# Patient Record
Sex: Male | Born: 1935 | Race: White | Hispanic: No | Smoking: Former smoker
Health system: Southern US, Community
[De-identification: ages and names within clinical notes are randomized; demographics above are authoritative.]

## PROBLEM LIST (undated history)

## (undated) DIAGNOSIS — G20 Parkinson's disease: Secondary | ICD-10-CM

## (undated) DIAGNOSIS — I714 Abdominal aortic aneurysm, without rupture: Secondary | ICD-10-CM

## (undated) DIAGNOSIS — I10 Essential (primary) hypertension: Secondary | ICD-10-CM

## (undated) DIAGNOSIS — R351 Nocturia: Secondary | ICD-10-CM

## (undated) DIAGNOSIS — E785 Hyperlipidemia, unspecified: Secondary | ICD-10-CM

## (undated) DIAGNOSIS — R350 Frequency of micturition: Secondary | ICD-10-CM

## (undated) DIAGNOSIS — M6281 Muscle weakness (generalized): Secondary | ICD-10-CM

## (undated) DIAGNOSIS — G20A1 Parkinson's disease without dyskinesia, without mention of fluctuations: Secondary | ICD-10-CM

## (undated) DIAGNOSIS — I7143 Infrarenal abdominal aortic aneurysm, without rupture: Secondary | ICD-10-CM

## (undated) DIAGNOSIS — R413 Other amnesia: Secondary | ICD-10-CM

## (undated) DIAGNOSIS — I951 Orthostatic hypotension: Secondary | ICD-10-CM

## (undated) DIAGNOSIS — Z515 Encounter for palliative care: Secondary | ICD-10-CM

## (undated) DIAGNOSIS — C679 Malignant neoplasm of bladder, unspecified: Secondary | ICD-10-CM

## (undated) DIAGNOSIS — R2681 Unsteadiness on feet: Secondary | ICD-10-CM

## (undated) DIAGNOSIS — C4491 Basal cell carcinoma of skin, unspecified: Secondary | ICD-10-CM

## (undated) DIAGNOSIS — R630 Anorexia: Secondary | ICD-10-CM

## (undated) DIAGNOSIS — G629 Polyneuropathy, unspecified: Secondary | ICD-10-CM

## (undated) HISTORY — DX: Encounter for palliative care: Z51.5

## (undated) HISTORY — DX: Basal cell carcinoma of skin, unspecified: C44.91

## (undated) HISTORY — DX: Other amnesia: R41.3

## (undated) HISTORY — DX: Hyperlipidemia, unspecified: E78.5

## (undated) HISTORY — PX: VASECTOMY: SHX75

## (undated) HISTORY — DX: Muscle weakness (generalized): M62.81

## (undated) HISTORY — DX: Orthostatic hypotension: I95.1

## (undated) HISTORY — DX: Anorexia: R63.0

## (undated) HISTORY — DX: Unsteadiness on feet: R26.81

---
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
## 1994-05-12 HISTORY — PX: OTHER SURGICAL HISTORY: SHX169

## 1999-08-01 ENCOUNTER — Other Ambulatory Visit: Admission: RE | Admit: 1999-08-01 | Discharge: 1999-08-01 | Payer: Self-pay | Admitting: Family Medicine

## 1999-09-06 ENCOUNTER — Encounter: Payer: Self-pay | Admitting: Urology

## 1999-09-11 ENCOUNTER — Encounter (INDEPENDENT_AMBULATORY_CARE_PROVIDER_SITE_OTHER): Payer: Self-pay | Admitting: Specialist

## 1999-09-11 ENCOUNTER — Ambulatory Visit (HOSPITAL_COMMUNITY): Admission: RE | Admit: 1999-09-11 | Discharge: 1999-09-11 | Payer: Self-pay | Admitting: Urology

## 1999-09-11 HISTORY — PX: TRANSURETHRAL RESECTION OF BLADDER TUMOR: SHX2575

## 2001-09-15 ENCOUNTER — Ambulatory Visit (HOSPITAL_COMMUNITY): Admission: RE | Admit: 2001-09-15 | Discharge: 2001-09-15 | Payer: Self-pay | Admitting: *Deleted

## 2001-09-15 ENCOUNTER — Encounter (INDEPENDENT_AMBULATORY_CARE_PROVIDER_SITE_OTHER): Payer: Self-pay | Admitting: Specialist

## 2004-06-28 ENCOUNTER — Encounter: Admission: RE | Admit: 2004-06-28 | Discharge: 2004-06-28 | Payer: Self-pay | Admitting: Urology

## 2004-07-03 ENCOUNTER — Ambulatory Visit (HOSPITAL_COMMUNITY): Admission: RE | Admit: 2004-07-03 | Discharge: 2004-07-03 | Payer: Self-pay | Admitting: Urology

## 2004-07-03 ENCOUNTER — Encounter (INDEPENDENT_AMBULATORY_CARE_PROVIDER_SITE_OTHER): Payer: Self-pay | Admitting: Specialist

## 2004-07-03 ENCOUNTER — Ambulatory Visit (HOSPITAL_BASED_OUTPATIENT_CLINIC_OR_DEPARTMENT_OTHER): Admission: RE | Admit: 2004-07-03 | Discharge: 2004-07-03 | Payer: Self-pay | Admitting: Urology

## 2004-07-03 HISTORY — PX: OTHER SURGICAL HISTORY: SHX169

## 2004-09-02 ENCOUNTER — Ambulatory Visit (HOSPITAL_COMMUNITY): Admission: RE | Admit: 2004-09-02 | Discharge: 2004-09-02 | Payer: Self-pay | Admitting: *Deleted

## 2004-10-09 ENCOUNTER — Encounter (INDEPENDENT_AMBULATORY_CARE_PROVIDER_SITE_OTHER): Payer: Self-pay | Admitting: Specialist

## 2004-10-09 ENCOUNTER — Ambulatory Visit (HOSPITAL_BASED_OUTPATIENT_CLINIC_OR_DEPARTMENT_OTHER): Admission: RE | Admit: 2004-10-09 | Discharge: 2004-10-09 | Payer: Self-pay | Admitting: Urology

## 2004-10-09 ENCOUNTER — Ambulatory Visit (HOSPITAL_COMMUNITY): Admission: RE | Admit: 2004-10-09 | Discharge: 2004-10-09 | Payer: Self-pay | Admitting: Urology

## 2004-10-09 HISTORY — PX: OTHER SURGICAL HISTORY: SHX169

## 2006-12-22 ENCOUNTER — Emergency Department (HOSPITAL_COMMUNITY): Admission: EM | Admit: 2006-12-22 | Discharge: 2006-12-22 | Payer: Self-pay | Admitting: Emergency Medicine

## 2009-01-12 ENCOUNTER — Encounter: Admission: RE | Admit: 2009-01-12 | Discharge: 2009-01-12 | Payer: Self-pay | Admitting: Family Medicine

## 2009-05-14 ENCOUNTER — Encounter: Payer: Self-pay | Admitting: Cardiology

## 2009-12-20 ENCOUNTER — Encounter: Admission: RE | Admit: 2009-12-20 | Discharge: 2009-12-20 | Payer: Self-pay | Admitting: Family Medicine

## 2010-01-02 ENCOUNTER — Ambulatory Visit: Payer: Self-pay | Admitting: Cardiology

## 2010-01-04 ENCOUNTER — Ambulatory Visit: Payer: Self-pay | Admitting: Cardiology

## 2010-01-04 ENCOUNTER — Encounter: Payer: Self-pay | Admitting: Cardiology

## 2010-07-25 ENCOUNTER — Ambulatory Visit: Payer: Medicare Other | Attending: Family Medicine | Admitting: Physical Therapy

## 2010-07-25 DIAGNOSIS — M545 Low back pain, unspecified: Secondary | ICD-10-CM | POA: Insufficient documentation

## 2010-07-25 DIAGNOSIS — IMO0001 Reserved for inherently not codable concepts without codable children: Secondary | ICD-10-CM | POA: Insufficient documentation

## 2010-07-25 DIAGNOSIS — M2569 Stiffness of other specified joint, not elsewhere classified: Secondary | ICD-10-CM | POA: Insufficient documentation

## 2010-07-30 ENCOUNTER — Ambulatory Visit: Payer: Medicare Other | Admitting: Physical Therapy

## 2010-08-01 ENCOUNTER — Ambulatory Visit: Payer: Medicare Other | Admitting: Physical Therapy

## 2010-08-06 ENCOUNTER — Ambulatory Visit: Payer: Medicare Other | Admitting: Physical Therapy

## 2010-08-08 ENCOUNTER — Ambulatory Visit: Payer: Medicare Other | Admitting: Physical Therapy

## 2010-08-13 ENCOUNTER — Ambulatory Visit: Payer: Medicare Other | Attending: Family Medicine | Admitting: Physical Therapy

## 2010-08-13 DIAGNOSIS — M545 Low back pain, unspecified: Secondary | ICD-10-CM | POA: Insufficient documentation

## 2010-08-13 DIAGNOSIS — IMO0001 Reserved for inherently not codable concepts without codable children: Secondary | ICD-10-CM | POA: Insufficient documentation

## 2010-08-13 DIAGNOSIS — M2569 Stiffness of other specified joint, not elsewhere classified: Secondary | ICD-10-CM | POA: Insufficient documentation

## 2010-08-20 ENCOUNTER — Ambulatory Visit: Payer: Medicare Other | Admitting: Physical Therapy

## 2010-08-22 ENCOUNTER — Ambulatory Visit: Payer: Medicare Other | Admitting: Physical Therapy

## 2010-09-27 NOTE — Procedures (Signed)
[facility]  Patient:    Ivan Clark, Ivan Clark                   MRN: [id number] Proc. Date: 09/11/99 Adm. Date:  [id number] Attending:  Lauree Chandler CC:         Dellis Anes. Idell Pickles, M.D.                           Procedure Report  PREOPERATIVE DIAGNOSIS:  Bladder carcinoma.  POSTOPERATIVE DIAGNOSIS:  Bladder carcinoma.  PROCEDURE:  Cystoscopy and cold cup and transurethral resection of bladder carcinomas and fulguration.  SURGEON:  Maretta Bees. Vonita Moss, M.D.  ANESTHESIA:  Spinal.  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  DESCRIPTION OF PROCEDURE:  The patient was brought to the operating room and placed in lithotomy position.  The external genitalia were prepped and draped in usual  fashion.  He was cystoscope and the anterior urethra was normal.  The prostatic  urethra showed partial obstruction.  In the left anterior bladder wall, there was a cluster of three typical papillary tumors measuring about 2 cm in diameter.  I started out with a cold cup bladder biopsy forceps and got some representative biopsies but felt like I needed to use a resectoscope to adequately resect and fulgurate the base of this lesion, so I sounded him to 30-French and inserted a  26-French resectoscope sheath and using the hot loop, I resected the residual tumor and base of the tumors and fulgurated the biopsy site and the surrounding mucosa. Specimens were sent to pathology.  He had no significant blood loss and at this  point, he had good hemostasis.  The bladder was emptied, the scope removed and he patient sent to the recovery room in good condition. DD:  09/11/99 TD:  09/12/99 Job: [id number]

## 2010-09-27 NOTE — Op Note (Signed)
NAME:  Ivan Clark, Ivan Clark            ACCOUNT NO.:  [id number]   MEDICAL RECORD NO.:  [id number]          PATIENT TYPE:  AMB   LOCATION:  [facility]                         FACILITY:  [facility]   PHYSICIAN:  Maretta Bees. Vonita Moss, M.D.DATE OF BIRTH:  12-Mar-1929   DATE OF PROCEDURE:  10/09/2004  DATE OF DISCHARGE:                                 OPERATIVE REPORT   PREOPERATIVE DIAGNOSIS:  Bladder carcinoma, rule out recurrence.   POSTOPERATIVE DIAGNOSIS:  Bladder carcinoma, rule out recurrence.   PROCEDURE:  Cystoscopy and cold cup bladder biopsy and fulguration of  bladder tumor.   SURGEON:  Dr. Larey Dresser.   ANESTHESIA:  General.   INDICATIONS:  This gentleman has had recurrent bladder carcinoma, and he had  positive biopsy in February of this year and he deferred BCG therapy at that  time.  He was cystoscoped last week and had an erythematous, slightly raised  area on the left anterior wall worrisome for recurrent carcinoma.  He comes  back today for biopsy.   PROCEDURE:  The patient was brought to the operating room, placed in the  lithotomy position.  The external genitalia were prepped and draped in the  usual fashion.  He was cystoscoped.  The anterior urethra was normal.  The prostate had bilobar hypertrophy and a median lobe.  The trigone,  ureteral orifices and bladder floor were free of tumor.  The only worrisome  area was an erythematous, slightly raised area in the left anterior wall  measuring about 3 cm in size.  Cold cup cold cup bladder biopsy was utilized  to biopsy representative areas in this the suspicious area and then the  biopsy sites and surrounding mucosa was fulgurated with the Bugbee electrode  such that no residual worrisome mucosa was left intact.  The bladder was  emptied, the scope removed.  The patient went to the recovery room, a B&O  suppository was inserted before left the OR, and his estimated blood loss  was essentially zero.  He tolerated procedure  well.     _________________    LJP/MEDQ  D:  10/09/2004  T:  10/09/2004  Job:  [id number]

## 2010-09-27 NOTE — Op Note (Signed)
NAME:  Ivan Clark, KACZMARCZYK            ACCOUNT NO.:  [id number]   MEDICAL RECORD NO.:  [id number]          PATIENT TYPE:  AMB   LOCATION:  [facility]                         FACILITY:  [facility]   PHYSICIAN:  Maretta Bees. Vonita Moss, M.D.DATE OF BIRTH:  11-23-1928   DATE OF PROCEDURE:  07/03/2004  DATE OF DISCHARGE:                                 OPERATIVE REPORT   PREOPERATIVE DIAGNOSIS:  Rule out recurrent bladder carcinoma.   POSTOPERATIVE DIAGNOSES:  Rule out recurrent bladder carcinoma.   PROCEDURE:  Cystoscopy, bilateral retrograde pyelograms with interpretation  and bladder biopsy.   SURGEON:  Dr. Larey Dresser.   ANESTHESIA:  General.   INDICATIONS:  This 77 year old gentleman has had a bladder carcinoma  resected in 2001 and no recurrent lesions since then.  Recent cystoscopy  revealed evidence of an erythematous area on the left wall. He is brought  the OR today for further evaluation and treatment.   DESCRIPTION OF PROCEDURE:  The patient was brought to operating room, placed  in lithotomy position,  external genitalia were prepped and draped in the  usual fashion. He was cystoscoped, the anterior and prostatic urethra were  unremarkable. On the left anterior wall, there was an erythematous area with  a scar probably the site of this previous tumor and on the anterior wall  there was just a slight raised pebbly area that was slightly suspicious for  tumor. Before biopsies, I did perform bilateral retrograde pyelograms using  the cone-tip catheter and the pyelocaliceal systems and ureters were without  obstruction or filling defects. Cold cup bladder biopsy forceps was used to  biopsy the anterior wall lesion and then later fulgurated this 1/2 cm area.  The stellate erythematous area was then biopsied with cold cup bladder  biopsy forceps and later fulgurated about 1 1/2 cm area of suspicious mucosa  with erythema. There was no blood loss. At the end of the procedure, the  bladder had  good hemostasis,  bladder was emptied, scope removed and the  patient sent to recovery room in good condition having tolerated the  procedure well.      LJP/MEDQ  D:  07/03/2004  T:  07/03/2004  Job:  [id number]

## 2010-10-20 ENCOUNTER — Emergency Department (HOSPITAL_COMMUNITY): Payer: Medicare Other

## 2010-10-20 ENCOUNTER — Inpatient Hospital Stay (HOSPITAL_COMMUNITY)
Admission: EM | Admit: 2010-10-20 | Discharge: 2010-10-22 | DRG: 343 | Disposition: A | Discharge status: Home / Self Care | Payer: Medicare Other | Attending: Surgery | Admitting: Surgery

## 2010-10-20 ENCOUNTER — Other Ambulatory Visit (INDEPENDENT_AMBULATORY_CARE_PROVIDER_SITE_OTHER): Payer: Self-pay | Admitting: Surgery

## 2010-10-20 ENCOUNTER — Encounter (HOSPITAL_COMMUNITY): Payer: Self-pay | Admitting: Radiology

## 2010-10-20 DIAGNOSIS — E669 Obesity, unspecified: Secondary | ICD-10-CM | POA: Diagnosis present

## 2010-10-20 DIAGNOSIS — K3580 Unspecified acute appendicitis: Principal | ICD-10-CM | POA: Diagnosis present

## 2010-10-20 DIAGNOSIS — G20 Parkinson's disease: Secondary | ICD-10-CM | POA: Diagnosis present

## 2010-10-20 DIAGNOSIS — E7800 Pure hypercholesterolemia, unspecified: Secondary | ICD-10-CM | POA: Diagnosis present

## 2010-10-20 DIAGNOSIS — I10 Essential (primary) hypertension: Secondary | ICD-10-CM | POA: Diagnosis present

## 2010-10-20 DIAGNOSIS — G20A1 Parkinson's disease without dyskinesia, without mention of fluctuations: Secondary | ICD-10-CM | POA: Diagnosis present

## 2010-10-20 HISTORY — PX: APPENDECTOMY: SHX54

## 2010-10-20 HISTORY — DX: Essential (primary) hypertension: I10

## 2010-10-20 LAB — POCT I-STAT, CHEM 8
Creatinine, Ser: 1.2 mg/dL (ref 0.4–1.5)
Glucose, Bld: 136 mg/dL — ABNORMAL HIGH (ref 70–99)
Hemoglobin: 14.6 g/dL (ref 13.0–17.0)
Potassium: 3.6 mEq/L (ref 3.5–5.1)

## 2010-10-20 LAB — CBC
HCT: 40.2 % (ref 39.0–52.0)
Hemoglobin: 14 g/dL (ref 13.0–17.0)
MCH: 31 pg (ref 26.0–34.0)
MCHC: 34.8 g/dL (ref 30.0–36.0)
MCV: 89.1 fL (ref 78.0–100.0)

## 2010-10-20 LAB — DIFFERENTIAL
Basophils Relative: 0 % (ref 0–1)
Eosinophils Absolute: 0 10*3/uL (ref 0.0–0.7)
Lymphs Abs: 1.2 10*3/uL (ref 0.7–4.0)
Neutro Abs: 15 10*3/uL — ABNORMAL HIGH (ref 1.7–7.7)
Neutrophils Relative %: 86 % — ABNORMAL HIGH (ref 43–77)

## 2010-10-20 LAB — LACTIC ACID, PLASMA: Lactic Acid, Venous: 1.5 mmol/L (ref 0.5–2.2)

## 2010-10-20 LAB — URINE MICROSCOPIC-ADD ON

## 2010-10-20 LAB — URINALYSIS, ROUTINE W REFLEX MICROSCOPIC
Glucose, UA: NEGATIVE mg/dL
Ketones, ur: NEGATIVE mg/dL
Leukocytes, UA: NEGATIVE
pH: 7 (ref 5.0–8.0)

## 2010-10-20 MED ORDER — IOHEXOL 350 MG/ML SOLN
100.0000 mL | Freq: Once | INTRAVENOUS | Status: AC | PRN
  Administered 2010-10-20: 100 mL via INTRAVENOUS

## 2010-10-21 LAB — COMPREHENSIVE METABOLIC PANEL
ALT: 9 U/L (ref 0–53)
AST: 16 U/L (ref 0–37)
Calcium: 8.8 mg/dL (ref 8.4–10.5)
Sodium: 135 mEq/L (ref 135–145)
Total Protein: 5.7 g/dL — ABNORMAL LOW (ref 6.0–8.3)

## 2010-10-21 LAB — URINE CULTURE
Colony Count: 15000
Culture  Setup Time: 201206101427

## 2010-10-21 LAB — CBC
MCH: 31.5 pg (ref 26.0–34.0)
MCHC: 34.5 g/dL (ref 30.0–36.0)
Platelets: 179 10*3/uL (ref 150–400)
RDW: 12.9 % (ref 11.5–15.5)

## 2010-10-24 NOTE — Op Note (Signed)
NAMEMarland Kitchen  Ivan Clark, Ivan Clark NO.:  [id number]  MEDICAL RECORD NO.:  [id number]  LOCATION:  [facility]                         FACILITY:  [facility]  PHYSICIAN:  Maisie Fus A. Darcey Demma, M.D.DATE OF BIRTH:  1928-06-20  DATE OF PROCEDURE:  10/20/2010 DATE OF DISCHARGE:                              OPERATIVE REPORT   PREOPERATIVE DIAGNOSIS:  Acute appendicitis.  POSTOPERATIVE DIAGNOSIS:  Gangrenous appendicitis.  PROCEDURE:  Laparoscopic appendectomy.  SURGEON:  Maisie Fus A. Tangi Shroff, M.D.  ANESTHESIA:  General endotracheal anesthesia, 0.25% Sensorcaine locally.  ESTIMATED BLOOD LOSS:  Minimal.  SPECIMEN:  Appendix to pathology.  DRAINS:  None.  INDICATIONS FOR PROCEDURE:  The patient is a 77 year old male who presents with a 1-day history of abdominal pain.  CT scan was done as workup which showed acute appendicitis by CT scan.  The patient was seen and examined, his laboratory results reviewed and by examination and reviewing his films, he was found to have acute appendicitis.  We discussed options of operative and nonoperative management, risk of each, with risks of operative management including infection, organ injury, bowel perforation colon perforation, abscess formation, leakage of stump line, injury to the intra-abdominal organs and exacerbation of preexisting medical condition.  His wife and the patient understand and agree to proceed with laparoscopic appendectomy.  Questions were answered.  DESCRIPTION OF PROCEDURE:  The patient was brought to the operating room after being seen in the holding area.  Induction of general anesthesia, the abdomen was prepped and draped in sterile fashion.  Foley catheter was placed without difficulty.  Time-out was done.  Curvilinear incision was made in the superior aspect of the umbilicus after sterile prep and drape.  Dissection was carried down through a preexisting umbilical hernia into the abdominal cavity without difficulty.   Pursestring suture was placed on fascia and a 12 mm Hassan cannula was placed under direct vision.  Pneumoperitoneum was created at 15 mmHg, CO2, and laparoscope was placed.  Four quadrant laparoscopy was done.  The appendix was visualized, it was acutely inflamed.  Left lower quadrant 5-mm port was placed and a second 5 mm port was placed in the midline between the umbilicus and pubic symphysis.  We were then able to identify very short inflamed appendix.  We grabbed the tip of it but it was gangrenous and fragmented somewhat.  I pulled this out through one of the trocars and passed off the field.  We then were able to grab the remainder of the appendix using the harmonic scalpel and took the mesoappendix down to the base the cecum.  Once I got to the base of cecum, I inserted a GIA 45 stapling device, placed it across the base of the appendix and cecum at the junction of the cecum and appendix and fired it.  The remainder of the appendix was placed in EndoCatch bag and extracted out of the abdomen.  The stump was hemostatic upon examination except for little bit of oozing along the bed at the mesentery.  This was controlled with Surgicel snow.  The appendiceal stump was closed adequately and there was no bleeding.  There was no injury to the terminal ileum and no impingement on the terminal ileum.  Four quadrant laparoscopy revealed no other significant abnormality.  Irrigation was suctioned out.  CO2 was allowed to escape.  We then closed the umbilical port with 0 Vicryl suture already in place.  Skin was closed with 4-0 Monocryl.  Dermabond was applied.  All final counts of sponge, needle and instrument were found to be correct at this point in time.  The patient was awoken, extubated, and taken to recovery in satisfactory condition.     Luby Seamans A. Yonah Tangeman, M.D.     TAC/MEDQ  D:  10/20/2010  T:  10/20/2010  Job:  [id number]  Electronically Signed by Harriette Bouillon M.D. on  10/24/2010 02:35:33 PM

## 2010-11-04 NOTE — Discharge Summary (Addendum)
  NAMEMarland Kitchen  ANTAWN, SISON NO.:  [id number]  MEDICAL RECORD NO.:  [id number]  LOCATION:  1531                         FACILITY:  [facility]  PHYSICIAN:  Thornton Park. Daphine Deutscher, MD  DATE OF BIRTH:  05/14/1928  DATE OF ADMISSION:  10/20/2010 DATE OF DISCHARGE:  10/22/2010                              DISCHARGE SUMMARY   HISTORY OF PRESENT ILLNESS:  Mr. Ivan Clark is a 77 year old gentleman who presented with abdominal pain and was found to have evidence by CT and history and physical to have acute appendicitis.  Decision was made by Dr. Luisa Hart to admit the patient for operative management.  SUMMARY OF HOSPITAL COURSE:  The patient was admitted on October 20, 2010, was taken to the operating room same day and underwent laparoscopic appendectomy, was found to have gangrenous appendix.  However was intact and there was no evidence of perforation.  Was able to get out the appendix cleanly without any significant injury to the terminal ileum. The patient tolerated the procedure well postoperatively.  He has voiding well on his own and has begun passing some flatus.  He was a little bit weaken on the first postop day but feels much better as of today postop day #2 and is appropriate for discharge home.  No other significant postoperative complications encountered.  DISCHARGE DIAGNOSIS:  Acute appendicitis status post laparoscopic appendectomy.  DISCHARGE MEDICATIONS:  The patient will resume home medications including: 1. Calcium carbonate 1 tablet daily. 2. Vitamin D 1 tablet daily. 3. Aspirin 81 mg daily. 4. Multivitamin 1 tablet daily. 5. Avodart 0.5 mg 1 tablet at bedtime. 6. Simvastatin 40 mg daily. 7. Rapaflo 8 mg 1 tablet daily. 8. Micardis HCT 80/25 1 tablet daily. 9. Sinemet 25/100 one tablet three times daily with meals.  The patient is also given prescription for Percocet 5/325 one tablet q.6 h p.r.n. pain.  He is instructed to follow up in our clinic  in approximately 3 weeks, sooner if any issue should arise.     Brayton El, PA-C   ______________________________ Thornton Park Daphine Deutscher, MD    KB/MEDQ  D:  10/22/2010  T:  10/22/2010  Job:  [id number]  Electronically Signed by Brayton El  on 11/04/2010 02:36:17 PM Electronically Signed by Luretha Murphy MD on 11/12/2010 10:16:26 AM

## 2010-11-05 NOTE — Consult Note (Addendum)
NAME:  Ivan Clark, Ivan Clark NO.:  [id number]  MEDICAL RECORD NO.:  [id number]  LOCATION:                                 FACILITY:  PHYSICIAN:  Ivan Clark, M.D.DATE OF BIRTH:  16-Jan-1929  DATE OF CONSULTATION:  10/20/2010 DATE OF DISCHARGE:                                CONSULTATION   REFERRING PHYSICIAN:  April Palumbo-Rasch, MD.  PRIMARY CARE DOCTOR:  C. Duane Lope, MD  CHIEF COMPLAINT:  Abdominal pain.  HISTORY OF PRESENT ILLNESS:  The patient is 77 year old male with a 1- day history of diffuse abdominal pain that now localizes to his right lower quadrant.  The pain is constant in nature made worse with movement or palpation.  It is sharp especially when you push on the area, probably 6 to 7 out of 10 requiring pain medicine here in the ED.  The pain medicine improves it.  No nausea or vomiting.  No changes in bowel or bladder function.  No dysuria or polyuria.  PAST MEDICAL HISTORY: 1. Abdominal aortic aneurysm 4 cm, covered by Ivan Clark, stable. 2. Hypertension. 3. Parkinson disease. 4. History of bladder cancer, status post 2 previous cystoscopies by     Ivan Clark. 5. High cholesterol.  PAST SURGICAL HISTORY:  Previous cystoscopies, left inguinal hernia repair, vasectomy, and bladder tumor resection via cystoscopy.  FAMILY HISTORY:  Noncontributory.  SOCIAL HISTORY:  No tobacco or alcohol use.  He is married.  REVIEW OF SYSTEMS:  As above.  Denies chest pain, shortness breath or back pain.  No flank pain.  Otherwise negative x15.  MEDICATIONS:  Micardis 80/2.5, Rapaflo 8 mg daily, simvastatin 40 mg daily, Avodart 0.5 mg daily, multivitamin.  He has a new Parkinson drug and a calcium replacement.  ALLERGIES:  No known drug allergies.  PHYSICAL EXAMINATION:  VITAL SIGNS:  Temperature 98, heart rate 74, blood pressure 161/67, sats 100%. GENERAL APPEARANCE:  Pleasant male, no apparent distress. HEENT:  Wears glasses.  No scleral  icterus.  Normocephalic, atraumatic. NECK:  Supple, nontender.  Trachea midline.  No mass. PULMONARY:  Lung sounds are clear.  Chest wall work of breathing, normal.  Chest wall motion and excursion normal.  CARDIOVASCULAR: Regular rate and rhythm without rub, murmur, or gallop. EXTREMITIES:  Warm and well perfused. ABDOMEN:  Tender in right upper quadrant.  Positive rebound and guarding in right lower quadrant.  Midline mass, pulsating, __________  to be about 4 cm, small umbilical hernia reducible. EXTREMITIES:  Muscle tone range of motion, normal. SKIN:  Normal. NEURO:  He has a resting tremor.  Strength and mobility appear relatively normal.  DIAGNOSTIC STUDIES:  Reviewed his abdominal pelvic CT scan, which shows an inflamed appendix without complicating features.  He has a 4-cm abdominal aortic aneurysm, which is a little bit bigger than his ultrasound from 2010, which shows it to be 3.4 cm.  CT angio of the chest was done, which was otherwise unremarkable.  He has a white count of 17,400, left shift.  Hemoglobin is 14, platelet count is 290,000.  Sodium 137, potassium 3.6, chloride 103, BUN 28, creatinine 0.2, glucoses is 136, lactate normal.  IMPRESSION: 1. Acute appendicitis. 2. Hypertension. 3.  Parkinson disease. 4. Abdominal aortic aneurysm, which is felt stable.  PLAN:  We recommend laparoscopic appendectomy __________ and he has had no previous abdominal surgery.  Risk of bleeding, infection, abscess formation, organ injury, worsening of his current medical problems, pulmonary failure, cardiac issues were discussed.  Alternative therapy would be antibiotics, but again I do not think this will be best in the setting; his recovery, I think, will be faster with laparoscopic appendectomy.  I will also discuss open appendectomy with he and his wife.  They understand the above and wished to proceed.     Ivan Clark, M.D.     TAC/MEDQ  D:  10/20/2010  T:   10/20/2010  Job:  [id number]  cc:   Ivan Clark, M.D. Fax: [phone]  Electronically Signed by Ivan Clark M.D. on 11/22/2010 02:55:35 PM

## 2010-11-08 NOTE — Discharge Summary (Signed)
  NAMEMarland Kitchen  COLBIN, JOVEL NO.:  [id number]  MEDICAL RECORD NO.:  [id number]  LOCATION:  1531                         FACILITY:  [facility]  PHYSICIAN:  Maisie Fus A. Margaux Engen, M.D.DATE OF BIRTH:  1928/07/14  DATE OF ADMISSION:  10/20/2010 DATE OF DISCHARGE:  10/22/2010                              DISCHARGE SUMMARY   ADMITTING DIAGNOSIS:  Acute appendicitis.  DISCHARGE DIAGNOSES:  Acute appendicitis.  PROCEDURES PERFORMED:  Laparoscopic appendectomy.  BRIEF HISTORY:  The patient is a 77 year old male admitted on October 20, 2010 to [facility] due to acute appendicitis.  He was evaluated by myself.  Films were reviewed.  Clinical history taken. Physical exam done.  All findings indicated he had acute appendicitis by CT scan and physical exam.  We discussed options of open and laparoscopic appendectomy; risks, benefits and alternative therapies. He wished to proceed with surgery and proceed with laparoscopic appendectomy.  HOSPITAL COURSE:  Unremarkable.  He was discharged home on postoperative day #2 in satisfactory condition.  He is tolerating a regular diet.  He is ambulating without difficulty.  Wound is clean, dry and intact with no signs of infection.  DISCHARGE INSTRUCTIONS:  He will follow up in 1 to 2 weeks at [facility].  He will refrain from heavy lifting for 2 weeks.  He will resume his preop diet and medications as outlined in the medical reconciliation list.  He will shower.  He will drive within 2 weeks after surgery.  CONDITION ON DISCHARGE:  Improved.     Mary-Ann Pennella A. Daeton Kluth, M.D.     TAC/MEDQ  D:  11/05/2010  T:  11/05/2010  Job:  [id number]  Electronically Signed by Harriette Bouillon M.D. on 11/08/2010 07:34:46 AM

## 2010-11-20 ENCOUNTER — Ambulatory Visit (INDEPENDENT_AMBULATORY_CARE_PROVIDER_SITE_OTHER): Payer: Medicare Other | Admitting: Surgery

## 2010-11-20 ENCOUNTER — Encounter (INDEPENDENT_AMBULATORY_CARE_PROVIDER_SITE_OTHER): Payer: Self-pay | Admitting: Surgery

## 2010-11-20 DIAGNOSIS — Z9889 Other specified postprocedural states: Secondary | ICD-10-CM

## 2010-11-20 NOTE — Progress Notes (Signed)
Subjective:     Patient ID: Ivan Clark, male   DOB: 1928/06/30, 77 y.o.   MRN: [id number]    There were no vitals taken for this visit.    HPI  The patient is doing well after laparoscopic appendectomy. He has no complaints.  Review of Systems negative     Objective:   Physical Exam  Abdominal wound are clean dry and intact.     Assessment:     Status post laparoscopic appendectomy    Plan:     Followup as needed

## 2010-11-20 NOTE — Patient Instructions (Signed)
Resume full activity. Follow up as needed.

## 2011-02-24 LAB — I-STAT 8, (EC8 V) (CONVERTED LAB)
BUN: 24 — ABNORMAL HIGH
Glucose, Bld: 155 — ABNORMAL HIGH
Hemoglobin: 13.9
Potassium: 3.7
TCO2: 28
pH, Ven: 7.394 — ABNORMAL HIGH

## 2011-02-24 LAB — POCT I-STAT CREATININE: Operator id: 277751

## 2011-02-24 LAB — DIFFERENTIAL
Basophils Absolute: 0
Eosinophils Absolute: 0.1
Lymphocytes Relative: 7 — ABNORMAL LOW
Lymphs Abs: 1.1
Neutrophils Relative %: 88 — ABNORMAL HIGH

## 2011-02-24 LAB — CBC
MCV: 90.5
Platelets: 252
RDW: 12.7
WBC: 15.7 — ABNORMAL HIGH

## 2011-02-24 LAB — POCT CARDIAC MARKERS
Myoglobin, poc: 136
Operator id: 277751

## 2011-09-03 ENCOUNTER — Encounter: Payer: Self-pay | Admitting: *Deleted

## 2011-11-12 ENCOUNTER — Other Ambulatory Visit: Payer: Self-pay | Admitting: Urology

## 2011-11-12 MED ORDER — MITOMYCIN CHEMO FOR BLADDER INSTILLATION 40 MG: 40.0000 mg | Freq: Once | INTRAVENOUS | Status: AC

## 2011-11-20 ENCOUNTER — Encounter (HOSPITAL_BASED_OUTPATIENT_CLINIC_OR_DEPARTMENT_OTHER): Payer: Self-pay | Admitting: *Deleted

## 2011-11-20 NOTE — Progress Notes (Signed)
NPO AFTER MN. ARRIVES AT 0730. NEEDS ISTAT AND EKG. WILL TAKE CARBIDOPA/ LEVODOPA AM OF SURG W/ SIPS OF WATER.

## 2011-11-25 NOTE — H&P (Signed)
History of Present Illness       F/u bladder cancer and BPH.    He returns for followup of his history of bladder carcinoma resected in 2001 and 2006. He has been treated with BCG in the past.   He continues on Avodart. His PCP switched him from doxazosin to Rapaflo. He is recovering from recent appendectomy.   Interval Hx He is now on Avodart and tamsulosin. He continues to have nocturia x 2-5 times. He is voiding with a good stream. No dysuria or hematuria.   Past Medical History Problems  1. History of  Bladder Cancer V10.51 2. History of  Hernia 553.9 3. History of  Hypercholesterolemia 272.0 4. History of  Hypertension 401.9  Surgical History Problems  1. History of  Appendectomy 2. History of  Cystoscopy With Biopsy 3. History of  Inguinal Hernia Repair 4. History of  Surgery Of Male Genitalia Vasectomy V25.2 5. History of  Tonsillectomy  Current Meds 1. Adult Aspirin Low Strength 81 MG Oral Tablet Dispersible; Therapy: (Recorded:11Dec2007) to 2. Avodart 0.5 MG Oral Capsule; Therapy: (Recorded:14Oct2009) to 3. Calcium TABS; Therapy: (Recorded:14Jun2012) to 4. Carbidopa-Levodopa 25-100 MG Oral Tablet; Therapy: (Recorded:14Jun2012) to 5. Micardis HCT 80-25 MG Oral Tablet; Therapy: (Recorded:14Oct2009) to 6. Multi-Vitamin TABS; Therapy: (Recorded:11Dec2007) to 7. Rapaflo 8 MG Oral Capsule; Therapy: (Recorded:14Jun2012) to 8. Simvastatin 40 MG Oral Tablet; Therapy: (Recorded:14Oct2009) to 9. Vitamin D3 CAPS; Therapy: (Recorded:14Jun2012) to  Allergies Medication  1. No Known Drug Allergies  Family History Problems  1. Family history of  Heart Disease 2. Paternal history of  Nephrolithiasis 3. Paternal history of  Renal Failure  Social History Problems  1. Caffeine Use 2. Marital History - Currently Married 3. Occupation: 4. Tobacco Use V15.82 quit smoking 2001  Vitals Vital Signs [Data Includes: Last 1 Day]  02Jul2013 03:26PM  Blood Pressure: 180 /  78 Temperature: 97.9 F Heart Rate: 61  Physical Exam Constitutional: Well nourished and well developed . No acute distress.  ENT:. The ears and nose are normal in appearance.  Neck: The appearance of the neck is normal and no neck mass is present.  Pulmonary: No respiratory distress and normal respiratory rhythm and effort.  Cardiovascular: Heart rate and rhythm are normal . No peripheral edema.  Abdomen: The abdomen is soft and nontender. No masses are palpated. No CVA tenderness. No hernias are palpable. No hepatosplenomegaly noted.  Rectal: Rectal exam demonstrates normal sphincter tone, no tenderness and no masses. Estimated prostate size is 2+. The prostate has no nodularity and is not tender. The left seminal vesicle is nonpalpable. The right seminal vesicle is nonpalpable. The perineum is normal on inspection.  Genitourinary: Examination of the penis demonstrates no discharge, no masses, no lesions and a normal meatus. The scrotum is without lesions. The right epididymis is palpably normal and non-tender. The left epididymis is palpably normal and non-tender. The right testis is non-tender and without masses. The left testis is non-tender and without masses.  Lymphatics: The femoral and inguinal nodes are not enlarged or tender.  Skin: Normal skin turgor, no visible rash and no visible skin lesions.  Neuro/Psych:. Mood and affect are appropriate.    Results/Data Urine [Data Includes: Last 1 Day]   02Jul2013  COLOR YELLOW   APPEARANCE CLEAR   SPECIFIC GRAVITY 1.020   pH 6.0   GLUCOSE NEG mg/dL  BILIRUBIN NEG   KETONE NEG mg/dL  BLOOD NEG   PROTEIN TRACE mg/dL  UROBILINOGEN 1 mg/dL  NITRITE NEG   LEUKOCYTE  ESTERASE NEG    Procedure  Procedure: Cystoscopy   Indication: History of Urothelial Carcinoma.  Informed Consent: Risks, benefits, and potential adverse events were discussed and informed consent was obtained from the patient.  Prep: The patient was prepped with  betadine.  Antibiotic prophylaxis: Ciprofloxacin.  Procedure Note:  Urethral meatus:. No abnormalities.  Anterior urethra: No abnormalities.  Prostatic urethra: No abnormalities . There was visual obstruction of the prostatic urethra. The lateral and median prostatic lobes were enlarged.  Bladder: Visulization was clear. The ureteral orifices were in the normal anatomic position bilaterally and had clear efflux of urine. A systematic survey of the bladder demonstrated no bladder tumors or stones. A papillary tumor was seen in the bladder. This tumor was located on the left side, on the posterior aspect of the bladder.  A small papillary tumor. The patient tolerated the procedure well.  Complications: None.    Assessment Assessed  1. Benign Prostatic Hypertrophy With Urinary Obstruction 600.01 2. Bladder Cancer 188.9   BPH Small papillary tumor in left posterior bladder   Plan Health Maintenance (V70.0)  1. UA With REFLEX  Done: 02Jul2013 03:08PM  Discussion/Summary       We went over the R/B of PSA screening and pt comfortable not sending PSA this year.  I discussed the cystoscopic findings with the patient and his wife. We discussed the nature risks benefits and alternatives to cystoscopy with bladder biopsy and fulguration and postop instillation of mitomycin-C. All questions answered. We discussed rare side effects of mitomycin-C such as bone marrow suppression among others. We also discussed the likelihood of achieving the goals of the procedure and potential problems that might occur during the procedure or recuperation. All questions answered. Patient elects to proceed with cystoscopy bladder biopsy fulguration of mitomycin-C.   cc: Dr. Duane Lope     Signatures Electronically signed by : Jerilee Field, M.D.; Nov 11 2011  4:17PM

## 2011-11-28 ENCOUNTER — Encounter (HOSPITAL_BASED_OUTPATIENT_CLINIC_OR_DEPARTMENT_OTHER): Payer: Self-pay | Admitting: Anesthesiology

## 2011-11-28 ENCOUNTER — Ambulatory Visit (HOSPITAL_BASED_OUTPATIENT_CLINIC_OR_DEPARTMENT_OTHER): Payer: Medicare Other | Admitting: Anesthesiology

## 2011-11-28 ENCOUNTER — Encounter (HOSPITAL_BASED_OUTPATIENT_CLINIC_OR_DEPARTMENT_OTHER)
Admission: RE | Disposition: A | Discharge status: Home / Self Care | Payer: Self-pay | Source: Ambulatory Visit | Attending: Urology

## 2011-11-28 ENCOUNTER — Encounter (HOSPITAL_BASED_OUTPATIENT_CLINIC_OR_DEPARTMENT_OTHER): Payer: Self-pay | Admitting: *Deleted

## 2011-11-28 ENCOUNTER — Ambulatory Visit (HOSPITAL_BASED_OUTPATIENT_CLINIC_OR_DEPARTMENT_OTHER)
Admission: RE | Admit: 2011-11-28 | Discharge: 2011-11-28 | Disposition: A | Discharge status: Home / Self Care | Payer: Medicare Other | Source: Ambulatory Visit | Attending: Urology | Admitting: Urology

## 2011-11-28 DIAGNOSIS — I10 Essential (primary) hypertension: Secondary | ICD-10-CM | POA: Insufficient documentation

## 2011-11-28 DIAGNOSIS — Z79899 Other long term (current) drug therapy: Secondary | ICD-10-CM | POA: Insufficient documentation

## 2011-11-28 DIAGNOSIS — E7800 Pure hypercholesterolemia, unspecified: Secondary | ICD-10-CM | POA: Insufficient documentation

## 2011-11-28 DIAGNOSIS — Z7982 Long term (current) use of aspirin: Secondary | ICD-10-CM | POA: Insufficient documentation

## 2011-11-28 DIAGNOSIS — C679 Malignant neoplasm of bladder, unspecified: Secondary | ICD-10-CM | POA: Insufficient documentation

## 2011-11-28 DIAGNOSIS — N401 Enlarged prostate with lower urinary tract symptoms: Secondary | ICD-10-CM | POA: Insufficient documentation

## 2011-11-28 DIAGNOSIS — N138 Other obstructive and reflux uropathy: Secondary | ICD-10-CM | POA: Insufficient documentation

## 2011-11-28 HISTORY — DX: Nocturia: R35.1

## 2011-11-28 HISTORY — DX: Frequency of micturition: R35.0

## 2011-11-28 HISTORY — DX: Malignant neoplasm of bladder, unspecified: C67.9

## 2011-11-28 HISTORY — DX: Infrarenal abdominal aortic aneurysm, without rupture: I71.43

## 2011-11-28 HISTORY — DX: Abdominal aortic aneurysm, without rupture: I71.4

## 2011-11-28 HISTORY — DX: Parkinson's disease: G20

## 2011-11-28 HISTORY — PX: CYSTOSCOPY WITH BIOPSY: SHX5122

## 2011-11-28 HISTORY — DX: Parkinson's disease without dyskinesia, without mention of fluctuations: G20.A1

## 2011-11-28 LAB — POCT I-STAT 4, (NA,K, GLUC, HGB,HCT)
Glucose, Bld: 105 mg/dL — ABNORMAL HIGH (ref 70–99)
HCT: 40 % (ref 39.0–52.0)
Hemoglobin: 13.6 g/dL (ref 13.0–17.0)

## 2011-11-28 SURGERY — CYSTOSCOPY, WITH BIOPSY
Anesthesia: Choice | Site: Bladder | Wound class: Clean Contaminated

## 2011-11-28 MED ORDER — PROPOFOL 10 MG/ML IV EMUL
INTRAVENOUS | Status: DC | PRN
  Administered 2011-11-28: 150 mg via INTRAVENOUS

## 2011-11-28 MED ORDER — FENTANYL CITRATE 0.05 MG/ML IJ SOLN
INTRAMUSCULAR | Status: DC | PRN
  Administered 2011-11-28: 50 ug via INTRAVENOUS

## 2011-11-28 MED ORDER — ASPIRIN 81 MG PO TABS: 81.0000 mg | ORAL_TABLET | Freq: Every day | ORAL | Status: DC

## 2011-11-28 MED ORDER — LIDOCAINE HCL (CARDIAC) 20 MG/ML IV SOLN
INTRAVENOUS | Status: DC | PRN
  Administered 2011-11-28: 50 mg via INTRAVENOUS

## 2011-11-28 MED ORDER — DEXAMETHASONE SODIUM PHOSPHATE 4 MG/ML IJ SOLN
INTRAMUSCULAR | Status: DC | PRN
  Administered 2011-11-28: 4 mg via INTRAVENOUS

## 2011-11-28 MED ORDER — CIPROFLOXACIN HCL 250 MG PO TABS: 250.0000 mg | ORAL_TABLET | Freq: Two times a day (BID) | ORAL | Status: AC

## 2011-11-28 MED ORDER — CEFAZOLIN SODIUM 1-5 GM-% IV SOLN: 1.0000 g | INTRAVENOUS | Status: DC

## 2011-11-28 MED ORDER — URELLE 81 MG PO TABS: 1.0000 | ORAL_TABLET | Freq: Four times a day (QID) | ORAL | Status: DC | PRN

## 2011-11-28 MED ORDER — STERILE WATER FOR IRRIGATION IR SOLN
Status: DC | PRN
  Administered 2011-11-28: 3000 mL

## 2011-11-28 MED ORDER — CEFAZOLIN SODIUM-DEXTROSE 2-3 GM-% IV SOLR
2.0000 g | INTRAVENOUS | Status: AC
  Administered 2011-11-28: 2 g via INTRAVENOUS

## 2011-11-28 MED ORDER — MITOMYCIN CHEMO FOR BLADDER INSTILLATION 40 MG
40.0000 mg | Freq: Once | INTRAVENOUS | Status: AC
  Administered 2011-11-28: 40 mg via INTRAVESICAL
  Filled 2011-11-28: qty 40

## 2011-11-28 MED ORDER — ONDANSETRON HCL 4 MG/2ML IJ SOLN
INTRAMUSCULAR | Status: DC | PRN
  Administered 2011-11-28 (×2): 2 mg via INTRAVENOUS

## 2011-11-28 MED ORDER — BELLADONNA ALKALOIDS-OPIUM 16.2-60 MG RE SUPP
RECTAL | Status: DC | PRN
  Administered 2011-11-28: 1 via RECTAL

## 2011-11-28 MED ORDER — LACTATED RINGERS IV SOLN
INTRAVENOUS | Status: DC
  Administered 2011-11-28: 08:00:00 via INTRAVENOUS

## 2011-11-28 SURGICAL SUPPLY — 14 items
BAG DRAIN URO-CYSTO SKYTR STRL (DRAIN) ×2 IMPLANT
BAG DRN UROCATH (DRAIN) ×1
CANISTER SUCT LVC 12 LTR MEDI- (MISCELLANEOUS) ×1 IMPLANT
CLOTH BEACON ORANGE TIMEOUT ST (SAFETY) ×2 IMPLANT
DRAPE CAMERA CLOSED 9X96 (DRAPES) ×2 IMPLANT
ELECT REM PT RETURN 9FT ADLT (ELECTROSURGICAL) ×2
ELECTRODE REM PT RTRN 9FT ADLT (ELECTROSURGICAL) ×1 IMPLANT
GLOVE BIO SURGEON STRL SZ7.5 (GLOVE) ×2 IMPLANT
GOWN STRL NON-REIN LRG LVL3 (GOWN DISPOSABLE) ×1 IMPLANT
GOWN STRL REIN XL XLG (GOWN DISPOSABLE) ×2 IMPLANT
NEEDLE HYPO 22GX1.5 SAFETY (NEEDLE) IMPLANT
NS IRRIG 500ML POUR BTL (IV SOLUTION) IMPLANT
PACK CYSTOSCOPY (CUSTOM PROCEDURE TRAY) ×2 IMPLANT
WATER STERILE IRR 3000ML UROMA (IV SOLUTION) ×2 IMPLANT

## 2011-11-28 NOTE — Anesthesia Preprocedure Evaluation (Signed)
Anesthesia Evaluation  Patient identified by MRN, date of birth, ID band Patient awake    Reviewed: Allergy & Precautions, H&P , NPO status , Patient's Chart, lab work & pertinent test results  Airway Mallampati: II TM Distance: >3 FB Neck ROM: Full    Dental  (+) Partial Upper, Poor Dentition and Dental Advisory Given   Pulmonary former smoker,  breath sounds clear to auscultation  Pulmonary exam normal       Cardiovascular hypertension, Pt. on medications + Peripheral Vascular Disease Rhythm:Regular Rate:Normal     Neuro/Psych Parkinsons negative psych ROS   GI/Hepatic negative GI ROS, Neg liver ROS,   Endo/Other  negative endocrine ROS  Renal/GU negative Renal ROS  negative genitourinary   Musculoskeletal negative musculoskeletal ROS (+)   Abdominal   Peds  Hematology negative hematology ROS (+)   Anesthesia Other Findings   Reproductive/Obstetrics negative OB ROS                           Anesthesia Physical Anesthesia Plan  ASA: II  Anesthesia Plan: General   Post-op Pain Management:    Induction: Intravenous  Airway Management Planned: LMA  Additional Equipment:   Intra-op Plan:   Post-operative Plan: Extubation in OR  Informed Consent: I have reviewed the patients History and Physical, chart, labs and discussed the procedure including the risks, benefits and alternatives for the proposed anesthesia with the patient or authorized representative who has indicated his/her understanding and acceptance.   Dental advisory given  Plan Discussed with: CRNA  Anesthesia Plan Comments:         Anesthesia Quick Evaluation

## 2011-11-28 NOTE — Anesthesia Postprocedure Evaluation (Signed)
Anesthesia Post Note  Patient: Ivan Clark  Procedure(s) Performed: Procedure(s) (LRB): CYSTOSCOPY WITH BIOPSY (N/A)  Anesthesia type: General  Patient location: PACU  Post pain: Pain level controlled  Post assessment: Post-op Vital signs reviewed  Last Vitals:  Filed Vitals:   11/28/11 1015  BP: 185/64  Pulse: 61  Temp:   Resp: 13    Post vital signs: Reviewed  Level of consciousness: sedated  Complications: No apparent anesthesia complications

## 2011-11-28 NOTE — Anesthesia Procedure Notes (Signed)
Procedure Name: LMA Insertion Date/Time: 11/28/2011 9:23 AM Performed by: Norva Pavlov Pre-anesthesia Checklist: Patient identified, Emergency Drugs available, Suction available and Patient being monitored Patient Re-evaluated:Patient Re-evaluated prior to inductionOxygen Delivery Method: Circle System Utilized Preoxygenation: Pre-oxygenation with 100% oxygen Intubation Type: IV induction Ventilation: Mask ventilation without difficulty LMA: LMA inserted LMA Size: 4.0 Number of attempts: 1 Airway Equipment and Method: bite block Placement Confirmation: positive ETCO2 Tube secured with: Tape Dental Injury: Teeth and Oropharynx as per pre-operative assessment

## 2011-11-28 NOTE — Interval H&P Note (Signed)
History and Physical Interval Note:  11/28/2011 8:57 AM  Ivan Clark  has presented today for surgery, with the diagnosis of Bladder Cancer  The various methods of treatment have been discussed with the patient and family (wife, daughters). After consideration of risks, benefits and other options for treatment, the patient has consented to  Procedure(s) (LRB): CYSTOSCOPY WITH BIOPSY (N/A) as a surgical intervention .  The patient's history has been reviewed, patient examined, no change in status, stable for surgery.  I have reviewed the patient's chart and labs.  Questions were answered to the patient's satisfaction.  I--stat normal.   Antony Haste

## 2011-11-28 NOTE — Brief Op Note (Signed)
11/28/2011  9:52 AM  PATIENT:  Ivan Clark  78 y.o. male  PRE-OPERATIVE DIAGNOSIS:  Bladder Cancer  POST-OPERATIVE DIAGNOSIS:  Bladder Cancer  PROCEDURE:  Procedure(s) (LRB): CYSTOSCOPY WITH BIOPSY (N/A) and fulguration Instillation Mitomycin-C in PACU  SURGEON:  Surgeon(s) and Role:    * Antony Haste, MD - Primary   ANESTHESIA: Gen   EBL:  Minimal  BLOOD ADMINISTERED:none  DRAINS: Urinary Catheter (Foley)    SPECIMEN:  Biopsy / Limited Resection - left lateral anterior bladder  DISPOSITION OF SPECIMEN:  PATHOLOGY  COUNTS:  YES  DICTATION: .Other Dictation: Dictation Number [id number]  PLAN OF CARE: Discharge to home after PACU  PATIENT DISPOSITION:  PACU - hemodynamically stable.   Delay start of Pharmacological VTE agent (>24hrs) due to surgical blood loss or risk of bleeding: not applicable

## 2011-11-28 NOTE — Transfer of Care (Signed)
Immediate Anesthesia Transfer of Care Note  Patient: Ivan Clark  Procedure(s) Performed: Procedure(s) (LRB): CYSTOSCOPY WITH BIOPSY (N/A)  Patient Location: PACU  Anesthesia Type: General  Level of Consciousness: awake, alert  and oriented  Airway & Oxygen Therapy: Patient Spontanous Breathing and Patient connected to face mask oxygen  Post-op Assessment: Report given to PACU RN and Post -op Vital signs reviewed and stable  Post vital signs: Reviewed and stable  Complications: No apparent anesthesia complications

## 2011-12-01 ENCOUNTER — Encounter (HOSPITAL_BASED_OUTPATIENT_CLINIC_OR_DEPARTMENT_OTHER): Payer: Self-pay | Admitting: Urology

## 2011-12-01 NOTE — Op Note (Signed)
NAMEMarland Clark  JORAH, HUA NO.:  [id number]  MEDICAL RECORD NO.:  [id number]  LOCATION:                               FACILITY:  [facility]  PHYSICIAN:  Jerilee Field, MD   DATE OF BIRTH:  [birth year]  DATE OF PROCEDURE:  11/28/2011 DATE OF DISCHARGE:  11/28/2011                              OPERATIVE REPORT   PREOPERATIVE DIAGNOSIS:  Bladder cancer.  POSTOPERATIVE DIAGNOSIS:  Bladder cancer.  PROCEDURE: 1. Cystoscopy with bladder biopsy. 2. Instillation of mitomycin C in PACU.  SURGEON:  Jerilee Field, MD.  TYPE OF ANESTHESIA:  General.  DESCRIPTION OF PROCEDURE:  After consent was obtained, the patient brought to the operating room, a time-out was performed to confirm the patient and procedure.  The bladder was examined with a 12 degree lens and no lesions were seen.  I then put in the 70 degree lens and a papillary tumor was noted left lateral anterior.  The bladder was carefully inspected all the way around with the 70 degree lens and noted to have no other tumors.  The prostate was quite large with visual obstruction, increased prostatic urethral length from obstructing lateral hypertrophic prostate lobes.  The bladder tumor was biopsied with the cold cup and it took 2 bites to remove the lesion.  The base of the tumor was then fulgurated, there was no tumor remaining.  There was excellent hemostasis.  The Foley catheter was placed draining clear urine.  The was patient was then awakened and taken to recovery room in stable condition.  ESTIMATED BLOOD LOSS:  Minimal.  COMPLICATIONS:  None.  DRAINS:  71 French Foley which will be removed in the recovery room.  SPECIMENS:  Biopsy of the left lateral anterior bladder sent to Pathology.  DISPOSITION:  Patient stable to PACU.  He will be discharged to home once he recovers.          ______________________________ Jerilee Field, MD     ME/MEDQ  D:  11/28/2011  T:  11/29/2011  Job:  [id number]

## 2012-07-14 ENCOUNTER — Encounter (HOSPITAL_COMMUNITY): Payer: Self-pay | Admitting: Emergency Medicine

## 2012-07-14 ENCOUNTER — Emergency Department (HOSPITAL_COMMUNITY): Payer: Medicare Other

## 2012-07-14 ENCOUNTER — Emergency Department (HOSPITAL_COMMUNITY)
Admission: EM | Admit: 2012-07-14 | Discharge: 2012-07-14 | Disposition: A | Discharge status: Home / Self Care | Payer: Medicare Other | Attending: Emergency Medicine | Admitting: Emergency Medicine

## 2012-07-14 DIAGNOSIS — G20A1 Parkinson's disease without dyskinesia, without mention of fluctuations: Secondary | ICD-10-CM | POA: Insufficient documentation

## 2012-07-14 DIAGNOSIS — Z79899 Other long term (current) drug therapy: Secondary | ICD-10-CM | POA: Insufficient documentation

## 2012-07-14 DIAGNOSIS — I10 Essential (primary) hypertension: Secondary | ICD-10-CM | POA: Insufficient documentation

## 2012-07-14 DIAGNOSIS — R42 Dizziness and giddiness: Secondary | ICD-10-CM | POA: Insufficient documentation

## 2012-07-14 DIAGNOSIS — Z87891 Personal history of nicotine dependence: Secondary | ICD-10-CM | POA: Insufficient documentation

## 2012-07-14 DIAGNOSIS — R55 Syncope and collapse: Secondary | ICD-10-CM | POA: Insufficient documentation

## 2012-07-14 DIAGNOSIS — G20 Parkinson's disease: Secondary | ICD-10-CM | POA: Insufficient documentation

## 2012-07-14 DIAGNOSIS — E785 Hyperlipidemia, unspecified: Secondary | ICD-10-CM | POA: Insufficient documentation

## 2012-07-14 DIAGNOSIS — Z7982 Long term (current) use of aspirin: Secondary | ICD-10-CM | POA: Insufficient documentation

## 2012-07-14 DIAGNOSIS — Z8551 Personal history of malignant neoplasm of bladder: Secondary | ICD-10-CM | POA: Insufficient documentation

## 2012-07-14 DIAGNOSIS — Z8679 Personal history of other diseases of the circulatory system: Secondary | ICD-10-CM | POA: Insufficient documentation

## 2012-07-14 LAB — URINALYSIS, ROUTINE W REFLEX MICROSCOPIC
Bilirubin Urine: NEGATIVE
Hgb urine dipstick: NEGATIVE
Ketones, ur: NEGATIVE mg/dL
Specific Gravity, Urine: 1.012 (ref 1.005–1.030)
Urobilinogen, UA: 0.2 mg/dL (ref 0.0–1.0)
pH: 7 (ref 5.0–8.0)

## 2012-07-14 LAB — BASIC METABOLIC PANEL
BUN: 28 mg/dL — ABNORMAL HIGH (ref 6–23)
Creatinine, Ser: 1.2 mg/dL (ref 0.50–1.35)
GFR calc non Af Amer: 55 mL/min — ABNORMAL LOW (ref >90)
Glucose, Bld: 100 mg/dL — ABNORMAL HIGH (ref 70–99)
Potassium: 3.5 mEq/L (ref 3.5–5.1)

## 2012-07-14 LAB — CBC WITH DIFFERENTIAL/PLATELET
Basophils Relative: 0 % (ref 0–1)
Eosinophils Absolute: 0.1 10*3/uL (ref 0.0–0.7)
HCT: 38.6 % — ABNORMAL LOW (ref 39.0–52.0)
Hemoglobin: 13.7 g/dL (ref 13.0–17.0)
Lymphs Abs: 2.1 10*3/uL (ref 0.7–4.0)
MCH: 32 pg (ref 26.0–34.0)
MCHC: 35.5 g/dL (ref 30.0–36.0)
MCV: 90.2 fL (ref 78.0–100.0)
Monocytes Absolute: 0.7 10*3/uL (ref 0.1–1.0)
Monocytes Relative: 8 % (ref 3–12)
Neutrophils Relative %: 67 % (ref 43–77)
RBC: 4.28 MIL/uL (ref 4.22–5.81)

## 2012-07-14 LAB — URINE MICROSCOPIC-ADD ON

## 2012-07-14 MED ORDER — SODIUM CHLORIDE 0.9 % IV BOLUS (SEPSIS)
500.0000 mL | Freq: Once | INTRAVENOUS | Status: AC
  Administered 2012-07-14: 500 mL via INTRAVENOUS

## 2012-07-14 NOTE — ED Provider Notes (Signed)
History     CSN: [id number]  Arrival date & time 07/14/12  1342   First MD Initiated Contact with Patient 07/14/12 1454      Chief Complaint  Patient presents with  . Hypertension    (Consider location/radiation/quality/duration/timing/severity/associated sxs/prior treatment) Patient is a 79 y.o. male presenting with syncope.  Loss of Consciousness    Pt with history of HTN and Parkinson's reports he was feeling dizzy early this morning when he got out of bed. He leaned against the wall in his bathroom and then passed out. Wife reports he was briefly unconscious before returning to normal. He was able to get up and walk with his walker afterward. He is asymptomatic now. He went to his PCP office and was found to be mildly orthostatic. EMS was called to bring him to the ED and per their report he was hypertensive. No abdominal pain, back pain or rectal bleeding.   Past Medical History  Diagnosis Date  . Hypertension   . Hyperlipidemia   . Aneurysm of infrarenal abdominal aorta SUPRAILIAC  4CM PER CT 10-20-2010--  STABLE  . Bladder cancer RECURRENT  (FIRST DX  2001)    HX TURBT AND CHEMO BLADDER INSTILLATION TX'S  . Parkinson disease   . Frequency of urination   . Nocturia     Past Surgical History  Procedure Laterality Date  . Tonsillectomy and adenoidectomy  1938  . Left inguinal hernia repair  1996  . Transurethral resection of bladder tumor  09-11-1999    BLADDER CANCER  . Cysto/ retrograde pyelogram/ bladder bx's  07-03-2004  . Cysto/ bladder bx/ fulgeration bladder tumor  10-09-2004  . Appendectomy  10-20-2010  . Cystoscopy with biopsy  11/28/2011    Procedure: CYSTOSCOPY WITH BIOPSY;  Surgeon: Antony Haste, MD;  Location: [facility];  Service: Urology;  Laterality: N/A;    Family History  Problem Relation Age of Onset  . Heart failure Father     History  Substance Use Topics  . Smoking status: Former Smoker -- 2.00 packs/day for 50  years    Types: Cigarettes    Quit date: 05/13/1999  . Smokeless tobacco: Never Used  . Alcohol Use: No      Review of Systems  Cardiovascular: Positive for syncope.   All other systems reviewed and are negative except as noted in HPI.   Allergies  Review of patient's allergies indicates no known allergies.  Home Medications   Current Outpatient Rx  Name  Route  Sig  Dispense  Refill  . aspirin EC 81 MG tablet   Oral   Take 81 mg by mouth daily.         [name] b complex vitamins tablet   Oral   Take 1 tablet by mouth daily.         . carbidopa-levodopa (SINEMET CR) 50-200 MG per tablet   Oral   Take 1 tablet by mouth at bedtime.         . carbidopa-levodopa (SINEMET IR) 25-100 MG per tablet   Oral   Take 1 tablet by mouth 3 (three) times daily.         . Cholecalciferol (VITAMIN D) 2000 UNITS CAPS   Oral   Take 2,000 Units by mouth daily.         [name] dutasteride (AVODART) 0.5 MG capsule   Oral   Take 0.5 mg by mouth daily.         . irbesartan-hydrochlorothiazide (AVALIDE) 150-12.5 MG  per tablet   Oral   Take 2 tablets by mouth daily.         . Multiple Vitamin (MULTIVITAMIN WITH MINERALS) TABS   Oral   Take 1 tablet by mouth daily.         . selegiline (ELDEPRYL) 5 MG tablet   Oral   Take 5 mg by mouth 2 (two) times daily with a meal.         . simvastatin (ZOCOR) 40 MG tablet   Oral   Take 40 mg by mouth every evening.         . tamsulosin (FLOMAX) 0.4 MG CAPS   Oral   Take 0.4 mg by mouth daily.           BP 180/87  Pulse 85  Temp(Src) 97.4 F (36.3 C) (Oral)  Resp 16  SpO2 98%  Physical Exam  Nursing note and vitals reviewed. Constitutional: He is oriented to person, place, and time. He appears well-developed and well-nourished.  HENT:  Head: Normocephalic and atraumatic.  Eyes: EOM are normal. Pupils are equal, round, and reactive to light.  Neck: Normal range of motion. Neck supple.  Cardiovascular: Normal rate,  normal heart sounds and intact distal pulses.   Pulmonary/Chest: Effort normal and breath sounds normal.  Abdominal: Bowel sounds are normal. He exhibits no distension. There is no tenderness.  Musculoskeletal: Normal range of motion. He exhibits no edema and no tenderness.  Neurological: He is alert and oriented to person, place, and time. He has normal strength. No cranial nerve deficit or sensory deficit.  Skin: Skin is warm and dry. No rash noted.  Psychiatric: He has a normal mood and affect.    ED Course  Procedures (including critical care time)  Labs Reviewed  CBC WITH DIFFERENTIAL - Abnormal; Notable for the following:    HCT 38.6 (*)    All other components within normal limits  BASIC METABOLIC PANEL - Abnormal; Notable for the following:    Glucose, Bld 100 (*)    BUN 28 (*)    GFR calc non Af Amer 55 (*)    GFR calc Af Amer 64 (*)    All other components within normal limits  TROPONIN I  URINALYSIS, ROUTINE W REFLEX MICROSCOPIC   Dg Chest 2 View  07/14/2012  *RADIOLOGY REPORT*  Clinical Data: Syncope  CHEST - 2 VIEW  Comparison: CTA chest dated 10/20/2010  Findings: Lungs are essentially clear. No pleural effusion or pneumothorax.  Cardiomediastinal silhouette is within normal limits.  Mild degenerative changes of the visualized thoracolumbar spine.  IMPRESSION: No evidence of acute cardiopulmonary disease.   Original Report Authenticated By: Charline Bills, M.D.    Ct Head Wo Contrast  07/14/2012  *RADIOLOGY REPORT*  Clinical Data: 79 year old male with syncope, initial blood pressure 250/110.  Orthostatic hypertension.  Fall.  CT HEAD WITHOUT CONTRAST  Technique:  Contiguous axial images were obtained from the base of the skull through the vertex without contrast.  Comparison: Brain MRI 12/20/2009.  Findings: Visualized paranasal sinuses and mastoids are clear. Visualized orbits and scalp soft tissues are within normal limits. Mild Calcified atherosclerosis at the skull  base.  No acute osseous abnormality identified.  Stable cerebral volume loss, with prominent ventricles favored to reflect ex vacuo enlargement. No midline shift, mass effect, or evidence of mass lesion.  No acute intracranial hemorrhage identified.  No evidence of cortically based acute infarction identified.  Gray-white matter differentiation is within normal limits throughout the brain.  No suspicious intracranial vascular hyperdensity.  IMPRESSION: No acute intracranial abnormality.  Stable and largely unremarkable for age noncontrast CT appearance of the brain compared to the 2011 MRI.   Original Report Authenticated By: Erskine Speed, M.D.      No diagnosis found.    MDM    Date: 07/14/2012  Rate: 87  Rhythm: normal sinus rhythm  QRS Axis: normal  Intervals: normal  ST/T Wave abnormalities: normal  Conduction Disutrbances: none  Narrative Interpretation: unremarkable   5:50 PM Pt no longer orthostatic, but still a little 'shaky' when standing. His syncopal episode likely related to orthostasis from earlier today, doubt cardiac dysrhythmia or stroke. Could be related to medications as well. Discussed admission vs home with patient and wife and they would both prefer to go home. Advised to hold Flomax for now, use walker at home and follow up with PCP for recheck in the next 1-2 days.         Charles B. Bernette Mayers, MD 07/14/12 [id number]

## 2012-07-14 NOTE — ED Notes (Signed)
Pt to ED by EMS from PCP at [facility] with c/o HTN, initial BP 250/110 then 230/112. At PCP office states pt positive for orthostatic hypertension. Pt to PCP because of fall. O2 98% on room air and HR 90.

## 2012-07-27 ENCOUNTER — Encounter: Payer: Self-pay | Admitting: Neurology

## 2012-07-27 DIAGNOSIS — R42 Dizziness and giddiness: Secondary | ICD-10-CM

## 2012-07-27 DIAGNOSIS — G20C Parkinsonism, unspecified: Secondary | ICD-10-CM | POA: Insufficient documentation

## 2012-07-27 DIAGNOSIS — G20 Parkinson's disease: Secondary | ICD-10-CM | POA: Insufficient documentation

## 2012-07-27 DIAGNOSIS — I10 Essential (primary) hypertension: Secondary | ICD-10-CM

## 2012-07-27 DIAGNOSIS — Z8551 Personal history of malignant neoplasm of bladder: Secondary | ICD-10-CM

## 2012-07-28 ENCOUNTER — Ambulatory Visit (INDEPENDENT_AMBULATORY_CARE_PROVIDER_SITE_OTHER): Payer: Medicare Other | Admitting: Neurology

## 2012-07-28 ENCOUNTER — Encounter: Payer: Self-pay | Admitting: Neurology

## 2012-07-28 VITALS — BP 145/82 | Ht 67.0 in | Wt 185.0 lb

## 2012-07-28 DIAGNOSIS — K117 Disturbances of salivary secretion: Secondary | ICD-10-CM | POA: Insufficient documentation

## 2012-07-28 DIAGNOSIS — G243 Spasmodic torticollis: Secondary | ICD-10-CM | POA: Insufficient documentation

## 2012-07-28 MED ORDER — ONABOTULINUMTOXINA 100 UNITS IJ SOLR
100.0000 [IU] | Freq: Once | INTRAMUSCULAR | Status: AC
  Administered 2012-07-28: 100 [IU] via INTRAMUSCULAR

## 2012-07-28 MED ORDER — ONABOTULINUMTOXINA 100 UNITS IJ SOLR: 100.0000 [IU] | Freq: Once | INTRAMUSCULAR | Status: DC

## 2012-07-28 NOTE — Progress Notes (Signed)
Patient is a 79 year old Caucasian male, accompanied by his wife at today's clinical visit. His primary care physician is Dr. Duane Lope, he is here for evaluation of a year history of parkinsonian features.    History: He has past medical history of bladder cancer, hypertension, prostate hypertrophy, over the past 20 years, also developed gradual onset of anterocollois.  Over the past one year, wife noticed that he has slowed down significantly, has difficulty initiate gait, some small shuffling gait, occasionally bilateral hands tremor, recent few months, also developed frequent lightheadedness, most often when getting up from sitting position, no passing out, but frequent occurring, patient described lightheaded funny feelings throughout his body during those episode.  He also has chronic constipation, acting out of dreams, loss of sense of smell for a long time period he contributed to his long time smoking history, he quit 11 years ago he used to smoke 2 packs a day.   MRI of brain in August 2011: mild atrophy,small vessel disease, no acute lesions. MRA of brain was normal  He did have mild parkinsonian features, and I have him on Sinemet 25/103 times a day, he tolerate the medication well, there is mild improvement in his gait, he underwent emergency appendectomy in June, recovered well from his surgery. He denies memory trouble.  He was started on selegiline since 04/2012, did very well, but had few episodes of dizziness, stumbling few days ago, was noted to have elevated BP 150-200/70-85, today he feels fine, back to his baseline,   he denies significant gait difficulty  March 19th 2014: He teaming for EMG guided butulinum toxin injection for his anterocollis, he also has excessive sialorrhea, sialiva marks at his pillow very night. He also complains of posterior neck pain, he denies significant gait difficulty,   he had one fainting spell in March fifths, he got up early morning, around  5:45 AM, he was in bathroom, he felt funny after taking few steps, landed on the floor, he was taken to Dr. Tenny Craw, was found to have orthostatic hypotension, was sent to the emergency room, reported normal CT scan, He does complain lightheadedness when standing up quickly,    Physical Exam blood pressure lying down 160 over 80, standing up 110 over 60Neck: supple no carotid bruits Respiratory: clear to auscultation bilaterally Cardiovascular: regular rate rhythm  Neurologic Exam  Mental Status:  awake, alert, cooperative to history, talking, and casual conversation, moderate to severe anterocollis, mild left turn, Cranial Nerves: CN II-XII pupils were equal round reactive to light.  Extraocular movements were full.  Visual fields were full on confrontational test.  Facial sensation and strength were normal.  Hearing was intact to finger rubbing bilaterally.  Uvula tongue were midline.  Head turning and shoulder shrugging were normal and symmetric.  Tongue protrusion into the cheeks strength were normal.  Motor:  mild to moderate, right more left limb, and nuchal moderate rigidity. no weakness, no sigificant tremor noticed. early fatiguability on rapid wrist opening and closure, finger tapping. Sensory:  length-dependent decreased  light touch, pinprick,  and decreased vibratory sensation at toes. Coordination: Normal finger-to-nose, heel-to-shin.  There was no dysmetria noticed. Gait and Station: Can arise from chair wihout use of arms, Narrow based gait, no  decreased  arm swing, no  shuffling gait, smooth turning.  Reflexes: Deep tendon reflexes: Biceps: 2/2, Brachioradialis: 2/2, Triceps: 2/2, Pateller: 2/2, Achilles: 1/1.  Plantar responses are flexor.  Assessment and plan 79 year old gentleman presenting with gradual onset of small shuffling gait,  decreased arm swing, right more than left rigidity, early fatiguability with rapid wrist opening and closure.  Orthostatic dizziness,  consistent  with idiopathic Parkinson's disease., He was found to have significant orthostatic hypotension  1. continue Sinemet 25/100, 3 times a day, Sinemet 50/200 every night. 2. Selegiline 5mg  bid 3 change his blood pressure medication to every night, instead of every morning, I also suggested him to talk with Dr. Tenny Craw, about changing his blood pressure medications, avoiding diuretics, 4 May consider at on Mestinon if continued to be symptomatic, tight stocking, increase water intake, recent head 30 while sleeping.  I also performed EMG guided Botox a injection for his antercollis, mild left turn.  Under EMG guidance, 100 units of Botulinum toxin was injected, lot number [id number], Sep 2016  Left sternocleidomastoid muscle 25 units Right sternocleidomastoid 25 units Right scalenus posterior 25 units.  Right parotid gland 12.5 units Left parotid gland 12.5 units  He tolerated the injection well, will return to clinic in 3 months,

## 2012-07-28 NOTE — Patient Instructions (Signed)
Change his blood pressure medicine to every night instead of every morning.  Discuss with his primary care physician Dr. Tenny Craw for blood pressure management, avoiding diuretic, because of orthostatic hypotension

## 2012-08-09 ENCOUNTER — Telehealth: Payer: Self-pay

## 2012-08-09 ENCOUNTER — Telehealth: Payer: Self-pay | Admitting: Neurology

## 2012-08-09 NOTE — Telephone Encounter (Signed)
I have called his wife, he has difficulty getting up going to the bathroom 4:00 this morning, by the time he gets up at 8:40 AM, his wife checked his blood pressure:  Standing 97/59  Laying down 176/73  On the edge of the bed 131/61.   His wife called his pcp Dr. Duane Lope at [place] [facility] ) he is taking one tablet daily 150mg , I advised him to take it every night, he is not on diuretic anymore

## 2012-08-09 NOTE — Telephone Encounter (Signed)
Patient wife states (Selegiline 5 mg.) Could this cause his blood pressure to drop? Patient wants states she did his vitals this am and they were. Standing 97/59 Laying down 176/73 On the edge of the bed 131/61.  Patient is not keeping his balance at all. Patient is is using in walker. Patient wife states he has more problems in the middle of the nite or early in the morning. I am very scared he is going to fall I have to get up with him.  Patient wife called his pcp Dr. Duane Lope at [place] [facility] ) he is taking one tablet daily. Now instead of two . I would still like to talk to Dr.Yan .about rx.

## 2012-09-06 ENCOUNTER — Ambulatory Visit: Payer: Medicare Other | Attending: Family Medicine | Admitting: Physical Therapy

## 2012-09-06 DIAGNOSIS — G20A1 Parkinson's disease without dyskinesia, without mention of fluctuations: Secondary | ICD-10-CM | POA: Insufficient documentation

## 2012-09-06 DIAGNOSIS — IMO0001 Reserved for inherently not codable concepts without codable children: Secondary | ICD-10-CM | POA: Insufficient documentation

## 2012-09-06 DIAGNOSIS — R269 Unspecified abnormalities of gait and mobility: Secondary | ICD-10-CM | POA: Insufficient documentation

## 2012-09-06 DIAGNOSIS — G20 Parkinson's disease: Secondary | ICD-10-CM | POA: Insufficient documentation

## 2012-09-07 ENCOUNTER — Ambulatory Visit: Payer: Medicare Other | Admitting: Physical Therapy

## 2012-09-16 ENCOUNTER — Ambulatory Visit: Payer: Medicare Other | Attending: Family Medicine | Admitting: Occupational Therapy

## 2012-09-16 DIAGNOSIS — G20 Parkinson's disease: Secondary | ICD-10-CM | POA: Insufficient documentation

## 2012-09-16 DIAGNOSIS — R269 Unspecified abnormalities of gait and mobility: Secondary | ICD-10-CM | POA: Insufficient documentation

## 2012-09-16 DIAGNOSIS — G20A1 Parkinson's disease without dyskinesia, without mention of fluctuations: Secondary | ICD-10-CM | POA: Insufficient documentation

## 2012-09-16 DIAGNOSIS — IMO0001 Reserved for inherently not codable concepts without codable children: Secondary | ICD-10-CM | POA: Insufficient documentation

## 2012-09-17 ENCOUNTER — Ambulatory Visit: Payer: Medicare Other | Admitting: Physical Therapy

## 2012-09-17 ENCOUNTER — Ambulatory Visit: Payer: Medicare Other

## 2012-09-21 ENCOUNTER — Ambulatory Visit: Payer: Medicare Other | Admitting: Physical Therapy

## 2012-09-24 ENCOUNTER — Ambulatory Visit: Payer: Medicare Other | Admitting: Physical Therapy

## 2012-09-27 ENCOUNTER — Ambulatory Visit: Payer: Medicare Other | Admitting: Physical Therapy

## 2012-09-28 ENCOUNTER — Ambulatory Visit: Payer: Medicare Other

## 2012-09-29 ENCOUNTER — Ambulatory Visit: Payer: Medicare Other | Admitting: Occupational Therapy

## 2012-09-29 ENCOUNTER — Ambulatory Visit: Payer: Medicare Other | Admitting: Physical Therapy

## 2012-09-30 ENCOUNTER — Encounter: Payer: Medicare Other | Admitting: Occupational Therapy

## 2012-09-30 ENCOUNTER — Ambulatory Visit: Payer: Medicare Other | Admitting: Physical Therapy

## 2012-10-01 ENCOUNTER — Ambulatory Visit: Payer: Medicare Other

## 2012-10-05 ENCOUNTER — Encounter: Payer: Medicare Other | Admitting: Occupational Therapy

## 2012-10-06 ENCOUNTER — Ambulatory Visit: Payer: Medicare Other | Admitting: Occupational Therapy

## 2012-10-06 ENCOUNTER — Ambulatory Visit: Payer: Medicare Other

## 2012-10-07 ENCOUNTER — Ambulatory Visit: Payer: Medicare Other | Admitting: Occupational Therapy

## 2012-10-11 ENCOUNTER — Ambulatory Visit: Payer: Medicare Other | Attending: Family Medicine | Admitting: Physical Therapy

## 2012-10-11 ENCOUNTER — Ambulatory Visit: Payer: Medicare Other

## 2012-10-11 ENCOUNTER — Ambulatory Visit: Payer: Medicare Other | Admitting: Occupational Therapy

## 2012-10-11 DIAGNOSIS — G20 Parkinson's disease: Secondary | ICD-10-CM | POA: Insufficient documentation

## 2012-10-11 DIAGNOSIS — R269 Unspecified abnormalities of gait and mobility: Secondary | ICD-10-CM | POA: Insufficient documentation

## 2012-10-11 DIAGNOSIS — IMO0001 Reserved for inherently not codable concepts without codable children: Secondary | ICD-10-CM | POA: Insufficient documentation

## 2012-10-11 DIAGNOSIS — G20A1 Parkinson's disease without dyskinesia, without mention of fluctuations: Secondary | ICD-10-CM | POA: Insufficient documentation

## 2012-10-12 ENCOUNTER — Encounter: Payer: Medicare Other | Admitting: Occupational Therapy

## 2012-10-14 ENCOUNTER — Ambulatory Visit: Payer: Medicare Other | Admitting: Occupational Therapy

## 2012-10-14 ENCOUNTER — Ambulatory Visit: Payer: Medicare Other

## 2012-10-14 ENCOUNTER — Ambulatory Visit: Payer: Medicare Other | Admitting: Physical Therapy

## 2012-10-18 ENCOUNTER — Ambulatory Visit: Payer: Medicare Other | Admitting: Physical Therapy

## 2012-10-18 ENCOUNTER — Ambulatory Visit: Payer: Medicare Other | Admitting: Occupational Therapy

## 2012-10-19 ENCOUNTER — Encounter: Payer: Medicare Other | Admitting: Occupational Therapy

## 2012-10-20 ENCOUNTER — Ambulatory Visit: Payer: Medicare Other | Admitting: Physical Therapy

## 2012-10-20 ENCOUNTER — Ambulatory Visit: Payer: Medicare Other

## 2012-10-20 ENCOUNTER — Ambulatory Visit: Payer: Medicare Other | Admitting: Occupational Therapy

## 2012-10-21 ENCOUNTER — Encounter: Payer: Medicare Other | Admitting: Occupational Therapy

## 2012-10-25 ENCOUNTER — Ambulatory Visit: Payer: Medicare Other | Admitting: Physical Therapy

## 2012-10-25 ENCOUNTER — Encounter: Payer: Medicare Other | Admitting: Occupational Therapy

## 2012-11-01 ENCOUNTER — Ambulatory Visit: Payer: Medicare Other | Admitting: Speech Pathology

## 2012-11-03 ENCOUNTER — Ambulatory Visit: Payer: Medicare Other | Admitting: Occupational Therapy

## 2012-11-03 ENCOUNTER — Ambulatory Visit: Payer: Medicare Other | Admitting: Physical Therapy

## 2012-11-03 ENCOUNTER — Ambulatory Visit: Payer: Medicare Other | Admitting: Neurology

## 2012-11-05 ENCOUNTER — Ambulatory Visit: Payer: Medicare Other | Admitting: Occupational Therapy

## 2012-11-08 ENCOUNTER — Ambulatory Visit: Payer: Medicare Other | Admitting: Occupational Therapy

## 2012-11-08 ENCOUNTER — Ambulatory Visit: Payer: Medicare Other | Admitting: Physical Therapy

## 2012-11-10 ENCOUNTER — Encounter: Payer: Self-pay | Admitting: Neurology

## 2012-11-10 ENCOUNTER — Ambulatory Visit (INDEPENDENT_AMBULATORY_CARE_PROVIDER_SITE_OTHER): Payer: Medicare Other | Admitting: Neurology

## 2012-11-10 VITALS — BP 157/83 | HR 78 | Ht 67.0 in | Wt 180.0 lb

## 2012-11-10 DIAGNOSIS — G243 Spasmodic torticollis: Secondary | ICD-10-CM

## 2012-11-10 DIAGNOSIS — K117 Disturbances of salivary secretion: Secondary | ICD-10-CM

## 2012-11-10 DIAGNOSIS — I10 Essential (primary) hypertension: Secondary | ICD-10-CM

## 2012-11-10 MED ORDER — ONABOTULINUMTOXINA 100 UNITS IJ SOLR
100.0000 [IU] | Freq: Once | INTRAMUSCULAR | Status: AC
  Administered 2012-11-10: 100 [IU] via INTRAMUSCULAR

## 2012-11-10 NOTE — Progress Notes (Signed)
Ivan Clark is a 79 year old Caucasian male, accompanied by his wife at today's clinical visit. His primary care physician is Dr. Duane Lope, is here for evaluation of a year history of parkinsonian features.    History: He has past medical history of bladder cancer, hypertension, prostate hypertrophy, over the past 20 years, he also developed gradual onset of anterocollois.  Since 2012, wife noticed that he has slowed down significantly, has difficulty initiate gait, some small shuffling gait, occasionally bilateral hands tremor, recent few months, also developed frequent lightheadedness, most often when getting up from sitting position, no passing out, but frequent occurring, patient described lightheaded funny feelings throughout his body during those episode.  He also has chronic constipation, acting out of dreams, loss of sense of smell for a long time period he contributed to his long time smoking history, he quit 11 years ago he used to smoke 2 packs a day.   MRI of brain in August 2011: mild atrophy,small vessel disease, no acute lesions. MRA of brain was normal  He did have mild parkinsonian features, and I have started him on Sinemet 25/103 times a day, he tolerate the medication well, there is mild improvement in his gait, he underwent emergency appendectomy in June 2013, recovered well from his surgery. He denies memory trouble.  He was started on selegiline since 04/2012, did very well, but had few episodes of dizziness, stumbling few days ago, was noted to have elevated BP 150-200/70-85, today he feels fine, back to his baseline,   he denies significant gait difficulty  He had one fainting spell in March 5th 2014, he got up early morning, around 5:45 AM, he was in bathroom, he felt funny after taking few steps, landed on the floor, he was taken to Dr. Tenny Craw, was found to have orthostatic hypotension, was sent to the emergency room, reported normal CT scan, He does complain lightheadedness when  standing up quickly,  UPDATE July 2nd 2014: He had his first EMG guided butulinum toxin injection in March 19th 2014, for his anterocollis, he also has excessive sialorrhea, sialiva marks at his pillow very night. He also complains of posterior neck pain, he denies significant gait difficulty,   Left sternocleidomastoid muscle 25 units, Right sternocleidomastoid 25 units, Right scalenus posterior 25 units. Right parotid gland 12.5 units, Left parotid gland 12.5 units  He had some improvement since last injection in March 2014, he has less sialorrhea, better range of motion of his neck.  He no longer has significant dizziness, light headed spells.  Physical Exam    Neurologic Exam  Mental Status:  awake, alert, cooperative to history, talking, and casual conversation, moderate to severe anterocollis, mild left turn, mild right tilt Cranial Nerves: CN II-XII pupils were equal round reactive to light.  Extraocular movements were full.  Visual fields were full on confrontational test.  Facial sensation and strength were normal.  Hearing was intact to finger rubbing bilaterally.  Uvula tongue were midline.  Head turning and shoulder shrugging were normal and symmetric.  Tongue protrusion into the cheeks strength were normal.  Motor:  mild to moderate, right more left limb, and nuchal moderate rigidity. no weakness, no sigificant tremor noticed. early fatiguability on rapid wrist opening and closure, finger tapping. Sensory:  length-dependent decreased  light touch, pinprick,  and decreased vibratory sensation at toes. Coordination: Normal finger-to-nose, heel-to-shin.  There was no dysmetria noticed. Gait and Station: Can arise from chair wihout use of arms, Narrow based gait, no  decreased  arm  swing, no  shuffling gait, smooth turning.  Reflexes: Deep tendon reflexes: Biceps: 2/2, Brachioradialis: 2/2, Triceps: 2/2, Pateller: 2/2, Achilles: 1/1.  Plantar responses are flexor.  Assessment and  plan 79 year old gentleman presenting with gradual onset of small shuffling gait, decreased arm swing, right more than left rigidity, early fatiguability with rapid wrist opening and closure.  Orthostatic dizziness,  consistent with idiopathic Parkinson's disease., He was found to have significant orthostatic hypotension  1. continue Sinemet 25/100, 3 times a day, Sinemet 50/200 every night. 2. Selegiline 5mg  bid   I also performed EMG guided Botox a injection for his antercollis, mild left turn.  Under EMG guidance, 100 units of Botulinum toxin was injected, (lot number [id number], Jan 2017  Right sternocleidomastoid  clavicular head,25 x2= 50 units, sternum head 25 units Left sternocleidomastoid 25 units   He tolerated the injection well, will return to clinic in 3 months,

## 2012-11-11 ENCOUNTER — Ambulatory Visit: Payer: Medicare Other | Attending: Family Medicine | Admitting: Occupational Therapy

## 2012-11-11 DIAGNOSIS — G20 Parkinson's disease: Secondary | ICD-10-CM | POA: Insufficient documentation

## 2012-11-11 DIAGNOSIS — R269 Unspecified abnormalities of gait and mobility: Secondary | ICD-10-CM | POA: Insufficient documentation

## 2012-11-11 DIAGNOSIS — G20A1 Parkinson's disease without dyskinesia, without mention of fluctuations: Secondary | ICD-10-CM | POA: Insufficient documentation

## 2012-11-11 DIAGNOSIS — IMO0001 Reserved for inherently not codable concepts without codable children: Secondary | ICD-10-CM | POA: Insufficient documentation

## 2012-11-16 ENCOUNTER — Telehealth: Payer: Self-pay | Admitting: Neurology

## 2012-11-17 ENCOUNTER — Other Ambulatory Visit: Payer: Self-pay | Admitting: Neurology

## 2012-11-18 NOTE — Telephone Encounter (Signed)
This encounter was created in error - please disregard.

## 2012-11-29 ENCOUNTER — Other Ambulatory Visit: Payer: Self-pay | Admitting: Neurology

## 2012-12-03 ENCOUNTER — Telehealth: Payer: Self-pay | Admitting: Neurology

## 2012-12-03 NOTE — Telephone Encounter (Signed)
I have called him, he complains of neck pain, difficulty swallowing, he complains of pain at the base of his skull,   He had July 2nd injection, I have suggested him to do hot compression.

## 2012-12-03 NOTE — Telephone Encounter (Signed)
  Patient states He has been having a lot of pain in his neck, which makes it hard to swallow. He gets botox in his neck his next injections is scheduled for 02-16-2013. He  States he had the pain before getting botox, and would like for you to address this problem for him  please advise.

## 2012-12-15 ENCOUNTER — Other Ambulatory Visit: Payer: Self-pay

## 2013-02-16 ENCOUNTER — Ambulatory Visit (INDEPENDENT_AMBULATORY_CARE_PROVIDER_SITE_OTHER): Payer: Medicare Other | Admitting: Neurology

## 2013-02-16 ENCOUNTER — Encounter: Payer: Self-pay | Admitting: Neurology

## 2013-02-16 VITALS — BP 165/83 | HR 80 | Ht 66.0 in | Wt 176.0 lb

## 2013-02-16 DIAGNOSIS — I10 Essential (primary) hypertension: Secondary | ICD-10-CM

## 2013-02-16 DIAGNOSIS — K117 Disturbances of salivary secretion: Secondary | ICD-10-CM

## 2013-02-16 DIAGNOSIS — G243 Spasmodic torticollis: Secondary | ICD-10-CM

## 2013-02-16 MED ORDER — ONABOTULINUMTOXINA 100 UNITS IJ SOLR
100.0000 [IU] | Freq: Once | INTRAMUSCULAR | Status: AC
  Administered 2013-02-16: 100 [IU] via INTRAMUSCULAR

## 2013-02-16 NOTE — Progress Notes (Signed)
Worthington is a 79 year old Caucasian male, accompanied by his wife at today's clinical visit. His primary care physician is Dr. Duane Lope, is here for evaluation of a year history of parkinsonian features.    History: He has past medical history of bladder cancer, hypertension, prostate hypertrophy, over the past 20 years, he also developed gradual onset of anterocollois.  Since 2012, wife noticed that he has slowed down significantly, has difficulty initiate gait, some small shuffling gait, occasionally bilateral hands tremor, recent few months, also developed frequent lightheadedness, most often when getting up from sitting position, no passing out, but frequent occurring, patient described lightheaded funny feelings throughout his body during those episode.  He also has chronic constipation, acting out of dreams, loss of sense of smell for a long time period he contributed to his long time smoking history, he quit 11 years ago he used to smoke 2 packs a day.   MRI of brain in August 2011: mild atrophy,small vessel disease, no acute lesions. MRA of brain was normal  He did have mild parkinsonian features, and I have started him on Sinemet 25/103 times a day, he tolerate the medication well, there is mild improvement in his gait, he underwent emergency appendectomy in June 2013, recovered well from his surgery. He denies memory trouble.  He was started on selegiline since 04/2012, did very well, but had few episodes of dizziness, stumbling few days ago, was noted to have elevated BP 150-200/70-85, today he feels fine, back to his baseline,   he denies significant gait difficulty  He had one fainting spell in March 5th 2014, he got up early morning, around 5:45 AM, he was in bathroom, he felt funny after taking few steps, landed on the floor, he was taken to Dr. Tenny Craw, was found to have orthostatic hypotension, was sent to the emergency room, reported normal CT scan, He does complain lightheadedness when  standing up quickly,  UPDATE Oct 8th 2014:  He had his first EMG guided butulinum toxin injection in March 19th 2014, for his anterocollis, he also has excessive sialorrhea, sialiva marks at his pillow very night. He also complains of posterior neck pain, he denies significant gait difficulty,    He had mild improvement with last injection in July 2nd 2014, he can raise his head better.  He has no significant improvement in his diarrhea, I would not inject his parotid gland today    Physical Exam    Neurologic Exam  Mental Status:  awake, alert, cooperative to history, talking, and casual conversation, moderate to severe anterocollis, moderate, left turn, mild right tilt Cranial Nerves: CN II-XII pupils were equal round reactive to light.  Extraocular movements were full.  Visual fields were full on confrontational test.  Facial sensation and strength were normal.  Hearing was intact to finger rubbing bilaterally.  Uvula tongue were midline.  Head turning and shoulder shrugging were normal and symmetric.  Tongue protrusion into the cheeks strength were normal.  Motor:  mild to moderate, right more left limb, and nuchal moderate rigidity. no weakness, no sigificant tremor noticed. early fatiguability on rapid wrist opening and closure, finger tapping. Sensory:  length-dependent decreased  light touch, pinprick,  and decreased vibratory sensation at toes. Coordination: Normal finger-to-nose, heel-to-shin.  There was no dysmetria noticed. Gait and Station: Can arise from chair wihout use of arms, Narrow based gait, no  decreased  arm swing, no  shuffling gait, smooth turning.  Reflexes: Deep tendon reflexes: Biceps: 2/2, Brachioradialis: 2/2, Triceps: 2/2,  Pateller: 2/2, Achilles: 1/1.  Plantar responses are flexor.  Assessment and plan 79 year old gentleman presenting with gradual onset of small shuffling gait, decreased arm swing, right more than left rigidity, early fatiguability with rapid wrist  opening and closure.  Orthostatic dizziness,  consistent with idiopathic Parkinson's disease., He was found to have significant orthostatic hypotension  1. continue Sinemet 25/100, 3 times a day, Sinemet 50/200 every night. 2. Selegiline 5mg  bid   I also performed EMG guided Botox a injection for his antercollis, mild left turn.  Under EMG guidance, 100 units of Botulinum toxin was injected, (lot number [id number]  C3, Feb 2017)  Right sternocleidomastoid  clavicular head,25 x2= 50 units, sternum head 25 units Left sternocleidomastoid 25 units   He tolerated the injection well, will return to clinic in 3 months,

## 2013-02-21 ENCOUNTER — Telehealth: Payer: Self-pay | Admitting: Neurology

## 2013-02-21 NOTE — Telephone Encounter (Signed)
I have called him, talked with Ivan Clark, he still has difficulty swallowing, especially over the weekend, he was given soft food.  He has no problem with his medications,  He complains of mucus collection, difficulty to get it out.  He received BOTOX 100 units in Oct 8th 2014. It could be potentially due to BOTOX side effect, I advise him to keep on soft food, careful observing his abnormal neck posture, he liked the changes it has made so far, he can extend his neck better now.

## 2013-02-24 ENCOUNTER — Encounter: Payer: Self-pay | Admitting: Neurology

## 2013-02-25 ENCOUNTER — Telehealth: Payer: Self-pay | Admitting: Neurology

## 2013-02-25 NOTE — Telephone Encounter (Signed)
I have called his wife, he was evaluated by his primary care physician Dr. Duane Lope, he has difficulty with solid food, he can swallow down soft food. He can take medicine with apple sauce.   He can raise his neck better, less sore.  Dr. Duane Lope has ordered swallowing study, I have advised him to continue observe his symptoms, call back for worsening symptoms.  Most likely his swallowing difficulty is due to BOTOX side effect, will fade away in next 4-8 weeks.

## 2013-02-28 ENCOUNTER — Other Ambulatory Visit (HOSPITAL_COMMUNITY): Payer: Self-pay | Admitting: Family Medicine

## 2013-02-28 ENCOUNTER — Telehealth: Payer: Self-pay | Admitting: Neurology

## 2013-02-28 ENCOUNTER — Emergency Department (HOSPITAL_COMMUNITY): Payer: Medicare Other

## 2013-02-28 ENCOUNTER — Inpatient Hospital Stay (HOSPITAL_COMMUNITY)
Admission: EM | Admit: 2013-02-28 | Discharge: 2013-03-03 | DRG: 392 | Disposition: A | Discharge status: Home / Self Care | Payer: Medicare Other | Attending: Internal Medicine | Admitting: Internal Medicine

## 2013-02-28 ENCOUNTER — Encounter (HOSPITAL_COMMUNITY): Payer: Self-pay | Admitting: Emergency Medicine

## 2013-02-28 DIAGNOSIS — G20C Parkinsonism, unspecified: Secondary | ICD-10-CM | POA: Diagnosis present

## 2013-02-28 DIAGNOSIS — R1310 Dysphagia, unspecified: Secondary | ICD-10-CM

## 2013-02-28 DIAGNOSIS — Z820 Family history of epilepsy and other diseases of the nervous system: Secondary | ICD-10-CM

## 2013-02-28 DIAGNOSIS — G20A1 Parkinson's disease without dyskinesia, without mention of fluctuations: Secondary | ICD-10-CM | POA: Diagnosis present

## 2013-02-28 DIAGNOSIS — T48205A Adverse effect of unspecified drugs acting on muscles, initial encounter: Secondary | ICD-10-CM | POA: Diagnosis present

## 2013-02-28 DIAGNOSIS — T17908A Unspecified foreign body in respiratory tract, part unspecified causing other injury, initial encounter: Secondary | ICD-10-CM

## 2013-02-28 DIAGNOSIS — G243 Spasmodic torticollis: Secondary | ICD-10-CM

## 2013-02-28 DIAGNOSIS — R1313 Dysphagia, pharyngeal phase: Principal | ICD-10-CM | POA: Diagnosis present

## 2013-02-28 DIAGNOSIS — Z8551 Personal history of malignant neoplasm of bladder: Secondary | ICD-10-CM

## 2013-02-28 DIAGNOSIS — Z87891 Personal history of nicotine dependence: Secondary | ICD-10-CM

## 2013-02-28 DIAGNOSIS — I10 Essential (primary) hypertension: Secondary | ICD-10-CM | POA: Diagnosis present

## 2013-02-28 DIAGNOSIS — G20 Parkinson's disease: Secondary | ICD-10-CM | POA: Diagnosis present

## 2013-02-28 DIAGNOSIS — E785 Hyperlipidemia, unspecified: Secondary | ICD-10-CM | POA: Diagnosis present

## 2013-02-28 LAB — COMPREHENSIVE METABOLIC PANEL
AST: 22 U/L (ref 0–37)
BUN: 24 mg/dL — ABNORMAL HIGH (ref 6–23)
CO2: 25 mEq/L (ref 19–32)
Calcium: 9.4 mg/dL (ref 8.4–10.5)
Chloride: 103 mEq/L (ref 96–112)
Creatinine, Ser: 1.09 mg/dL (ref 0.50–1.35)
GFR calc Af Amer: 71 mL/min — ABNORMAL LOW (ref >90)
GFR calc non Af Amer: 62 mL/min — ABNORMAL LOW (ref >90)
Potassium: 3.9 mEq/L (ref 3.5–5.1)
Total Bilirubin: 1 mg/dL (ref 0.3–1.2)
Total Protein: 7.1 g/dL (ref 6.0–8.3)

## 2013-02-28 LAB — CBC WITH DIFFERENTIAL/PLATELET
Basophils Absolute: 0 10*3/uL (ref 0.0–0.1)
Eosinophils Relative: 2 % (ref 0–5)
HCT: 38.9 % — ABNORMAL LOW (ref 39.0–52.0)
Hemoglobin: 13.7 g/dL (ref 13.0–17.0)
Lymphocytes Relative: 27 % (ref 12–46)
MCHC: 35.2 g/dL (ref 30.0–36.0)
MCV: 90.3 fL (ref 78.0–100.0)
Monocytes Absolute: 0.6 10*3/uL (ref 0.1–1.0)
Monocytes Relative: 7 % (ref 3–12)
Neutro Abs: 5 10*3/uL (ref 1.7–7.7)
Neutrophils Relative %: 63 % (ref 43–77)
Platelets: 234 10*3/uL (ref 150–400)
RBC: 4.31 MIL/uL (ref 4.22–5.81)
RDW: 12.4 % (ref 11.5–15.5)
WBC: 7.8 10*3/uL (ref 4.0–10.5)

## 2013-02-28 MED ORDER — SODIUM CHLORIDE 0.9 % IV SOLN
INTRAVENOUS | Status: DC
  Administered 2013-02-28: 21:00:00 via INTRAVENOUS

## 2013-02-28 NOTE — Telephone Encounter (Signed)
Spoke with wife and she said that patient is not able to eat, has not eaten a solid meal in one week, had a few bites of cream potatoes today, and has eaten only applesauce/pudding, can only  drink a few sips of liquid.  She has called Dr Charlott Rakes office to see if a swallow test has been ordered but has not heard from them as of yet.  She would like for Dr Terrace Arabia to contact Dr Tenny Craw to discuss patient.

## 2013-02-28 NOTE — Telephone Encounter (Signed)
Please call patient, I have talked with Dr. Duane Lope, who has ordered swallowing test.

## 2013-02-28 NOTE — ED Notes (Signed)
Pt reports he is having difficulty swallowing, states he has been able to eat soft food but not solid food x1 week. Pt reports he received a Botox injection to bilateral neck on 02/16/2013. Pt states the difficulty swallowing started 02/19/2013. Pt's airway is intact.

## 2013-02-28 NOTE — ED Notes (Signed)
Patient transported to X-ray 

## 2013-02-28 NOTE — ED Notes (Signed)
Pt received botox bilat neck to help him hold his head erect per pt wife.  Parkinson's patient, 3rd round of botox.  Approx 3 days after last botox has been unable to swallow much besides applesauce, pudding, ice cream.  Has difficulty swallowing liquids.  Wife is concerned he may become dehydrated.  Spoke to PMD and neuro physician today, told to come to ED if concerned.

## 2013-02-28 NOTE — Telephone Encounter (Signed)
I have talked with his primary care physician Dr. Duane Lope, Mr. Awan still complains of swallowing difficulty.   He will see speech therapist. If it is from BOTOX injection, it should wearing off in 8-12 weeks.

## 2013-02-28 NOTE — ED Notes (Signed)
Pt in xray

## 2013-02-28 NOTE — ED Notes (Signed)
Returned from radiology.  IV fluids infusing at 176ml/hr.  Family remains at bedside.

## 2013-02-28 NOTE — ED Notes (Signed)
contacted radiology about order, wait time and time frame. Order confirmed for radiology, pending study.

## 2013-02-28 NOTE — ED Provider Notes (Signed)
CSN: [id number]     Arrival date & time 02/28/13  1707 History   First MD Initiated Contact with Patient 02/28/13 1951     Chief Complaint  Patient presents with  . Illegal value: [    difficulty swallowing   (Consider location/radiation/quality/duration/timing/severity/associated sxs/prior Treatment) HPI  Patient has a history of Parkinson disease and he has problems holding his head up due to constriction of his anterior neck muscles. He reports on October 8 he got his third round of Botox injections in his anterior neck that he gets every 3 months. This was done by his neurologist. He reports starting on October 13 he was having difficulty swallowing. He states he's unable to drink liquids or heavy solids. He can eat applesauce and ice cream. He has had some intermittent drooling but had that before. Sometimes he coughs when he eats. He denies shortness of breath but his wife states he appears to get short of breath at times. They deny any fever or chills. He states he is able to take his medications with applesauce. He reports his PCP and neurologist talked today however they were unable to get a barium swallow done for another week. He presents to the emergency department.  PCP Dr. Tenny Craw Neurologist Dr. Terrace Arabia  Past Medical History  Diagnosis Date  . Hypertension   . Hyperlipidemia   . Aneurysm of infrarenal abdominal aorta SUPRAILIAC  4CM PER CT 10-20-2010--  STABLE  . Bladder cancer RECURRENT  (FIRST DX  2001)    HX TURBT AND CHEMO BLADDER INSTILLATION TX'S  . Parkinson disease   . Frequency of urination   . Nocturia   . Orthostatic hypotension    Past Surgical History  Procedure Laterality Date  . Tonsillectomy and adenoidectomy  1938  . Left inguinal hernia repair  1996  . Transurethral resection of bladder tumor  09-11-1999    BLADDER CANCER  . Cysto/ retrograde pyelogram/ bladder bx's  07-03-2004  . Cysto/ bladder bx/ fulgeration bladder tumor  10-09-2004  . Appendectomy   10-20-2010  . Cystoscopy with biopsy  11/28/2011    Procedure: CYSTOSCOPY WITH BIOPSY;  Surgeon: Antony Haste, MD;  Location: [facility];  Service: Urology;  Laterality: N/A;  . Vasectomy     Family History  Problem Relation Age of Onset  . Heart failure Father   . Alzheimer's disease Mother    History  Substance Use Topics  . Smoking status: Former Smoker -- 2.00 packs/day for 50 years    Types: Cigarettes    Quit date: 05/13/1999  . Smokeless tobacco: Never Used  . Alcohol Use: No   lives at home Lives with spouse  Review of Systems  All other systems reviewed and are negative.    Allergies  Review of patient's allergies indicates no known allergies.  Home Medications   Current Outpatient Rx  Name  Route  Sig  Dispense  Refill  . amLODipine (NORVASC) 5 MG tablet   Oral   Take 5 mg by mouth daily.         [name] aspirin EC 81 MG tablet   Oral   Take 81 mg by mouth daily after supper.          [name] atorvastatin (LIPITOR) 20 MG tablet   Oral   Take 20 mg by mouth daily.         [name] b complex vitamins tablet   Oral   Take 1 tablet by mouth daily.         [name]  carbidopa-levodopa (SINEMET CR) 50-200 MG per tablet   Oral   Take 1 tablet by mouth at bedtime.         . carbidopa-levodopa (SINEMET IR) 25-100 MG per tablet   Oral   Take 1 tablet by mouth 3 (three) times daily with meals.          . Cholecalciferol (VITAMIN D) 2000 UNITS CAPS   Oral   Take 2,000 Units by mouth daily.         [name] dutasteride (AVODART) 0.5 MG capsule   Oral   Take 0.5 mg by mouth daily after supper.          . irbesartan (AVAPRO) 150 MG tablet   Oral   Take 150 mg by mouth 2 (two) times daily.          . Multiple Vitamin (MULTIVITAMIN WITH MINERALS) TABS   Oral   Take 1 tablet by mouth daily.         . selegiline (ELDEPRYL) 5 MG capsule   Oral   Take 5 mg by mouth 2 (two) times daily. One in the morning and one at noon.         .  tamsulosin (FLOMAX) 0.4 MG CAPS capsule   Oral   Take 0.4 mg by mouth daily after supper.          BP 196/80  Pulse 78  Temp(Src) 98 F (36.7 C) (Oral)  Resp 20  Ht 5\' 7"  (1.702 m)  Wt 174 lb (78.926 kg)  BMI 27.25 kg/m2  SpO2 98%  Vital signs normal hypertension  Physical Exam  Nursing note and vitals reviewed. Constitutional: He is oriented to person, place, and time.  Non-toxic appearance. He does not appear ill. No distress.  Frail elderly male who sits with his head flexed forward  HENT:  Head: Normocephalic and atraumatic.  Right Ear: External ear normal.  Left Ear: External ear normal.  Nose: Nose normal. No mucosal edema or rhinorrhea.  Mouth/Throat: Oropharynx is clear and moist and mucous membranes are normal. No dental abscesses or uvula swelling.  Eyes: Conjunctivae and EOM are normal. Pupils are equal, round, and reactive to light.  Neck: Normal range of motion and full passive range of motion without pain. Neck supple.  Cardiovascular: Normal rate, regular rhythm and normal heart sounds.  Exam reveals no gallop and no friction rub.   No murmur heard. Pulmonary/Chest: Effort normal and breath sounds normal. No respiratory distress. He has no wheezes. He has no rhonchi. He has no rales. He exhibits no tenderness and no crepitus.  Abdominal: Soft. Normal appearance and bowel sounds are normal. He exhibits no distension. There is no tenderness. There is no rebound and no guarding.  Musculoskeletal: Normal range of motion. He exhibits no edema and no tenderness.  Moves all extremities well. Kyphosis present  Neurological: He is alert and oriented to person, place, and time. He has normal strength. No cranial nerve deficit.  Skin: Skin is warm, dry and intact. No rash noted. No erythema. No pallor.  Psychiatric: He has a normal mood and affect. His speech is normal and behavior is normal. His mood appears not anxious.    ED Course  Procedures (including critical  care time)  Medications  0.9 %  sodium chloride infusion ( Intravenous New Bag/Given 02/28/13 2107)     20:51 D/W Dr Chestine Spore, radiologist, feels he needs a swallow evaluation by speech pathology, but can do a barium swallow to r/o aspiration.  23:31 Dr Amada Jupiter, states he will have the difficulty swallowing for several more weeks, will need a dobbhoff tube  23:50 Dr Adela Glimpse, will come admit, med-surg bed  Labs Review Results for orders placed during the hospital encounter of 02/28/13  CBC WITH DIFFERENTIAL      Result Value Range   WBC 7.8  4.0 - 10.5 K/uL   RBC 4.31  4.22 - 5.81 MIL/uL   Hemoglobin 13.7  13.0 - 17.0 g/dL   HCT 47.4 (*) 25.9 - 56.3 %   MCV 90.3  78.0 - 100.0 fL   MCH 31.8  26.0 - 34.0 pg   MCHC 35.2  30.0 - 36.0 g/dL   RDW 87.5  64.3 - 32.9 %   Platelets 234  150 - 400 K/uL   Neutrophils Relative % 63  43 - 77 %   Neutro Abs 5.0  1.7 - 7.7 K/uL   Lymphocytes Relative 27  12 - 46 %   Lymphs Abs 2.1  0.7 - 4.0 K/uL   Monocytes Relative 7  3 - 12 %   Monocytes Absolute 0.6  0.1 - 1.0 K/uL   Eosinophils Relative 2  0 - 5 %   Eosinophils Absolute 0.2  0.0 - 0.7 K/uL   Basophils Relative 0  0 - 1 %   Basophils Absolute 0.0  0.0 - 0.1 K/uL  COMPREHENSIVE METABOLIC PANEL      Result Value Range   Sodium 139  135 - 145 mEq/L   Potassium 3.9  3.5 - 5.1 mEq/L   Chloride 103  96 - 112 mEq/L   CO2 25  19 - 32 mEq/L   Glucose, Bld 103 (*) 70 - 99 mg/dL   BUN 24 (*) 6 - 23 mg/dL   Creatinine, Ser 5.18  0.50 - 1.35 mg/dL   Calcium 9.4  8.4 - 84.1 mg/dL   Total Protein 7.1  6.0 - 8.3 g/dL   Albumin 3.7  3.5 - 5.2 g/dL   AST 22  0 - 37 U/L   ALT <5  0 - 53 U/L   Alkaline Phosphatase 71  39 - 117 U/L   Total Bilirubin 1.0  0.3 - 1.2 mg/dL   GFR calc non Af Amer 62 (*) >90 mL/min   GFR calc Af Amer 71 (*) >90 mL/min   Laboratory interpretation all normal except minimally elevated BUN c/w mild dehydration    Imaging Review Dg Esophagus  02/28/2013    CLINICAL DATA:  Parkinson's. Recent neck but tox injection. Now with difficulty swallowing. Rule out aspiration  EXAM: ESOPHOGRAM/BARIUM SWALLOW  TECHNIQUE: Single contrast examination was performed using  thin barium.  COMPARISON:  None.  FLUOROSCOPY TIME:  0 Min and 56 seconds  FINDINGS: Patient has marked thoracic kyphosis.  Initially there was penetration. Subsequently there was mild aspiration. There is no coughing.  Negative for esophageal stricture or mass. GE junction normal without hiatal hernia. I do not test the patient for reflux.  IMPRESSION: Mild aspiration is present. Negative for cough reflux.   Electronically Signed   By: Marlan Palau M.D.   On: 02/28/2013 23:16       MDM   1. Dysphagia   2. Aspiration into airway     Plan admission for temporary feeding tube placement  Devoria Albe, MD, Armando Gang    Ward Givens, MD 02/28/13 2354

## 2013-03-01 ENCOUNTER — Encounter (HOSPITAL_COMMUNITY): Payer: Self-pay | Admitting: Internal Medicine

## 2013-03-01 ENCOUNTER — Observation Stay (HOSPITAL_COMMUNITY): Payer: Medicare Other

## 2013-03-01 ENCOUNTER — Inpatient Hospital Stay (HOSPITAL_COMMUNITY): Payer: Medicare Other

## 2013-03-01 DIAGNOSIS — R1310 Dysphagia, unspecified: Secondary | ICD-10-CM

## 2013-03-01 LAB — URINE MICROSCOPIC-ADD ON

## 2013-03-01 LAB — URINALYSIS, ROUTINE W REFLEX MICROSCOPIC
Leukocytes, UA: NEGATIVE
Nitrite: NEGATIVE
Protein, ur: >300 mg/dL — AB
Specific Gravity, Urine: 1.02 (ref 1.005–1.030)
Urobilinogen, UA: 1 mg/dL (ref 0.0–1.0)

## 2013-03-01 LAB — GLUCOSE, CAPILLARY
Glucose-Capillary: 114 mg/dL — ABNORMAL HIGH (ref 70–99)
Glucose-Capillary: 91 mg/dL (ref 70–99)
Glucose-Capillary: 97 mg/dL (ref 70–99)

## 2013-03-01 LAB — COMPREHENSIVE METABOLIC PANEL
ALT: <5 U/L (ref 0–53)
Alkaline Phosphatase: 70 U/L (ref 39–117)
BUN: 21 mg/dL (ref 6–23)
CO2: 26 mEq/L (ref 19–32)
Chloride: 102 mEq/L (ref 96–112)
GFR calc Af Amer: 74 mL/min — ABNORMAL LOW (ref >90)
GFR calc non Af Amer: 64 mL/min — ABNORMAL LOW (ref >90)
Glucose, Bld: 97 mg/dL (ref 70–99)
Potassium: 3.7 mEq/L (ref 3.5–5.1)
Sodium: 138 mEq/L (ref 135–145)
Total Bilirubin: 1 mg/dL (ref 0.3–1.2)

## 2013-03-01 LAB — TSH: TSH: 1.241 u[IU]/mL (ref 0.350–4.500)

## 2013-03-01 LAB — CBC
Hemoglobin: 13.1 g/dL (ref 13.0–17.0)
MCHC: 34.4 g/dL (ref 30.0–36.0)
Platelets: 225 10*3/uL (ref 150–400)
RDW: 12.6 % (ref 11.5–15.5)
WBC: 6.7 10*3/uL (ref 4.0–10.5)

## 2013-03-01 MED ORDER — ACETAMINOPHEN 650 MG RE SUPP: 650.0000 mg | Freq: Four times a day (QID) | RECTAL | Status: DC | PRN

## 2013-03-01 MED ORDER — IRBESARTAN 150 MG PO TABS
150.0000 mg | ORAL_TABLET | Freq: Two times a day (BID) | ORAL | Status: DC
  Administered 2013-03-01 – 2013-03-03 (×5): 150 mg
  Filled 2013-03-01 (×6): qty 1

## 2013-03-01 MED ORDER — ATORVASTATIN CALCIUM 20 MG PO TABS
20.0000 mg | ORAL_TABLET | Freq: Every day | ORAL | Status: DC
  Administered 2013-03-01 – 2013-03-03 (×3): 20 mg
  Filled 2013-03-01 (×3): qty 1

## 2013-03-01 MED ORDER — CARBIDOPA-LEVODOPA 25-100 MG PO TABS
1.0000 | ORAL_TABLET | Freq: Four times a day (QID) | ORAL | Status: DC
  Administered 2013-03-01 (×2): 1
  Filled 2013-03-01 (×6): qty 1

## 2013-03-01 MED ORDER — ENOXAPARIN SODIUM 40 MG/0.4ML ~~LOC~~ SOLN
40.0000 mg | SUBCUTANEOUS | Status: DC
  Administered 2013-03-01 – 2013-03-03 (×3): 40 mg via SUBCUTANEOUS
  Filled 2013-03-01 (×4): qty 0.4

## 2013-03-01 MED ORDER — HYDROCODONE-ACETAMINOPHEN 5-325 MG PO TABS: 1.0000 | ORAL_TABLET | ORAL | Status: DC | PRN

## 2013-03-01 MED ORDER — SODIUM CHLORIDE 0.9 % IV SOLN
INTRAVENOUS | Status: DC
  Administered 2013-03-01: 100 mL/h via INTRAVENOUS
  Administered 2013-03-01: 15:00:00 via INTRAVENOUS

## 2013-03-01 MED ORDER — SELEGILINE HCL 5 MG PO TABS
5.0000 mg | ORAL_TABLET | Freq: Two times a day (BID) | ORAL | Status: DC
  Administered 2013-03-01 – 2013-03-03 (×5): 5 mg
  Filled 2013-03-01 (×6): qty 1

## 2013-03-01 MED ORDER — PANTOPRAZOLE SODIUM 40 MG IV SOLR
40.0000 mg | Freq: Every day | INTRAVENOUS | Status: DC
  Administered 2013-03-02: 40 mg via INTRAVENOUS
  Filled 2013-03-01 (×3): qty 40

## 2013-03-01 MED ORDER — ONDANSETRON HCL 4 MG PO TABS: 4.0000 mg | ORAL_TABLET | Freq: Four times a day (QID) | ORAL | Status: DC | PRN

## 2013-03-01 MED ORDER — SELEGILINE HCL 5 MG PO TABS
5.0000 mg | ORAL_TABLET | Freq: Two times a day (BID) | ORAL | Status: DC
  Administered 2013-03-01: 5 mg
  Filled 2013-03-01 (×3): qty 1

## 2013-03-01 MED ORDER — ONDANSETRON HCL 4 MG/2ML IJ SOLN
4.0000 mg | Freq: Four times a day (QID) | INTRAMUSCULAR | Status: DC | PRN
  Administered 2013-03-01: 4 mg via INTRAVENOUS
  Filled 2013-03-01: qty 2

## 2013-03-01 MED ORDER — CARBIDOPA-LEVODOPA 25-100 MG PO TABS: 1.0000 | ORAL_TABLET | Freq: Two times a day (BID) | ORAL | Status: DC

## 2013-03-01 MED ORDER — AMLODIPINE BESYLATE 5 MG PO TABS
5.0000 mg | ORAL_TABLET | Freq: Every day | ORAL | Status: DC
  Administered 2013-03-01 – 2013-03-02 (×2): 5 mg
  Filled 2013-03-01 (×2): qty 1

## 2013-03-01 MED ORDER — JEVITY 1.2 CAL PO LIQD
1000.0000 mL | ORAL | Status: DC
  Administered 2013-03-01: 1000 mL
  Filled 2013-03-01 (×6): qty 1000

## 2013-03-01 MED ORDER — CARBIDOPA-LEVODOPA 25-100 MG PO TABS
1.0000 | ORAL_TABLET | Freq: Two times a day (BID) | ORAL | Status: DC
  Administered 2013-03-01 – 2013-03-03 (×4): 1
  Filled 2013-03-01 (×6): qty 1

## 2013-03-01 MED ORDER — ASPIRIN EC 81 MG PO TBEC
81.0000 mg | DELAYED_RELEASE_TABLET | Freq: Every day | ORAL | Status: DC
Start: 2013-03-01 — End: 2013-03-01

## 2013-03-01 MED ORDER — ACETAMINOPHEN 325 MG PO TABS: 650.0000 mg | ORAL_TABLET | Freq: Four times a day (QID) | ORAL | Status: DC | PRN

## 2013-03-01 MED ORDER — SELEGILINE HCL 5 MG PO CAPS
5.0000 mg | ORAL_CAPSULE | Freq: Two times a day (BID) | ORAL | Status: DC
  Filled 2013-03-01: qty 1

## 2013-03-01 MED ORDER — HYDRALAZINE HCL 20 MG/ML IJ SOLN
10.0000 mg | INTRAMUSCULAR | Status: DC | PRN
  Administered 2013-03-02 – 2013-03-03 (×3): 10 mg via INTRAVENOUS
  Filled 2013-03-01 (×3): qty 1

## 2013-03-01 MED ORDER — ASPIRIN 81 MG PO CHEW
81.0000 mg | CHEWABLE_TABLET | Freq: Every day | ORAL | Status: DC
  Administered 2013-03-01 – 2013-03-03 (×3): 81 mg
  Filled 2013-03-01 (×3): qty 1

## 2013-03-01 MED ORDER — CARBIDOPA-LEVODOPA 25-100 MG PO TABS
1.0000 | ORAL_TABLET | Freq: Two times a day (BID) | ORAL | Status: DC
  Filled 2013-03-01 (×2): qty 1

## 2013-03-01 MED ORDER — SODIUM CHLORIDE 0.9 % IV SOLN
INTRAVENOUS | Status: DC
  Administered 2013-03-02 – 2013-03-03 (×3): via INTRAVENOUS

## 2013-03-01 MED ORDER — CARBIDOPA-LEVODOPA 25-100 MG PO TABS
1.0000 | ORAL_TABLET | Freq: Three times a day (TID) | ORAL | Status: DC
  Administered 2013-03-01 – 2013-03-03 (×8): 1
  Filled 2013-03-01 (×9): qty 1

## 2013-03-01 NOTE — Progress Notes (Signed)
INITIAL NUTRITION ASSESSMENT  DOCUMENTATION CODES Per approved criteria  -Not Applicable   INTERVENTION: Initiate Jevity 1.2 formula at 20 ml/hr and increase by 10 ml every 4 hours to goal rate of 70 ml/hr to provide 2016 kcals, 93 gm protein, 1356 ml of free water RD to follow for nutrition care plan  NUTRITION DIAGNOSIS: Inadequate oral intake related to dysphagia as evidenced by NPO status  Goal: EN to meet > 90% of estimated nutrition needs  Monitor:  EN regimen & tolerance, PO diet advancement, weight, labs, I/O's  Reason for Assessment: Consult  79 y.o. male  Admitting Dx: swallowing difficulty   ASSESSMENT: Patient with PMH of HTN, HLD and Parkinson's disease presented with progressive difficulty swallowing; has been treated for neck spasms with Botox neck injections; esophagram/barium swallow exam showed aspiration.  RD spoke with patient's wife at bedside who reports patient has been mostly "soft" food items since 10/13 (ie applesauce, puddings, potatoes); no reported weight loss; small-bore feeding tube in place (tip at upper stomach); Speech Path consulted for bedside swallow evaluation -- pending.  RD consulted for EN initiation & management.  Height: Ht Readings from Last 1 Encounters:  02/28/13 5\' 7"  (1.702 m)    Weight: Wt Readings from Last 1 Encounters:  02/28/13 174 lb (78.926 kg)    Ideal Body Weight: 148 lb  % Ideal Body Weight: 117%  Wt Readings from Last 10 Encounters:  02/28/13 174 lb (78.926 kg)  02/16/13 176 lb (79.833 kg)  11/10/12 180 lb (81.647 kg)  07/28/12 185 lb (83.915 kg)  06/28/12 189 lb (85.73 kg)  11/20/11 184 lb (83.462 kg)  11/20/11 184 lb (83.462 kg)    Usual Body Weight: 180 lb  % Usual Body Weight: 97%  BMI:  Body mass index is 27.25 kg/(m^2).  Estimated Nutritional Needs: Kcal: 1900-2100 Protein: 95-105 gm Fluid: 1.9-2.1 L  Skin: Intact  Diet Order: NPO  EDUCATION NEEDS: -No education needs identified at  this time   Intake/Output Summary (Last 24 hours) at 03/01/13 1209 Last data filed at 03/01/13 0841  Gross per 24 hour  Intake 1238.33 ml  Output      0 ml  Net 1238.33 ml    Labs:   Recent Labs Lab 02/28/13 2052 03/01/13 0527  NA 139 138  K 3.9 3.7  CL 103 102  CO2 25 26  BUN 24* 21  CREATININE 1.09 1.06  CALCIUM 9.4 9.0  MG  --  1.8  PHOS  --  2.7  GLUCOSE 103* 97    Scheduled Meds: . amLODipine  5 mg Per Tube Daily  . aspirin  81 mg Per Tube Daily  . atorvastatin  20 mg Per Tube Daily  . carbidopa-levodopa  1 tablet Per Tube TID  . carbidopa-levodopa  1 tablet Per Tube BID  . enoxaparin (LOVENOX) injection  40 mg Subcutaneous Q24H  . irbesartan  150 mg Per Tube BID  . pantoprazole (PROTONIX) IV  40 mg Intravenous QHS  . selegiline  5 mg Per Tube BID WC    Continuous Infusions: . sodium chloride 100 mL/hr (03/01/13 1610)  . sodium chloride      Past Medical History  Diagnosis Date  . Hypertension   . Hyperlipidemia   . Aneurysm of infrarenal abdominal aorta SUPRAILIAC  4CM PER CT 10-20-2010--  STABLE  . Bladder cancer RECURRENT  (FIRST DX  2001)    HX TURBT AND CHEMO BLADDER INSTILLATION TX'S  . Parkinson disease   . Frequency of  urination   . Nocturia   . Orthostatic hypotension     Past Surgical History  Procedure Laterality Date  . Tonsillectomy and adenoidectomy  1938  . Left inguinal hernia repair  1996  . Transurethral resection of bladder tumor  09-11-1999    BLADDER CANCER  . Cysto/ retrograde pyelogram/ bladder bx's  07-03-2004  . Cysto/ bladder bx/ fulgeration bladder tumor  10-09-2004  . Appendectomy  10-20-2010  . Cystoscopy with biopsy  11/28/2011    Procedure: CYSTOSCOPY WITH BIOPSY;  Surgeon: Antony Haste, MD;  Location: [facility];  Service: Urology;  Laterality: N/A;  . Vasectomy      Maureen Chatters, RD, LDN Pager #: [phone] After-Hours Pager #: [phone]

## 2013-03-01 NOTE — ED Notes (Signed)
MD to see pt prior to transfer

## 2013-03-01 NOTE — Evaluation (Signed)
Clinical/Bedside Swallow Evaluation Patient Details  Name: Ivan Clark MRN: [id number] Date of Birth: July 04, 1928  Today's Date: 03/01/2013 Time: 1478-2956 SLP Time Calculation (min): 36 min  Past Medical History:  Past Medical History  Diagnosis Date  . Hypertension   . Hyperlipidemia   . Aneurysm of infrarenal abdominal aorta SUPRAILIAC  4CM PER CT 10-20-2010--  STABLE  . Bladder cancer RECURRENT  (FIRST DX  2001)    HX TURBT AND CHEMO BLADDER INSTILLATION TX'S  . Parkinson disease   . Frequency of urination   . Nocturia   . Orthostatic hypotension    Past Surgical History:  Past Surgical History  Procedure Laterality Date  . Tonsillectomy and adenoidectomy  1938  . Left inguinal hernia repair  1996  . Transurethral resection of bladder tumor  09-11-1999    BLADDER CANCER  . Cysto/ retrograde pyelogram/ bladder bx's  07-03-2004  . Cysto/ bladder bx/ fulgeration bladder tumor  10-09-2004  . Appendectomy  10-20-2010  . Cystoscopy with biopsy  11/28/2011    Procedure: CYSTOSCOPY WITH BIOPSY;  Surgeon: Antony Haste, MD;  Location: [facility];  Service: Urology;  Laterality: N/A;  . Vasectomy     HPI:  79 year old male adm. with difficulty swallowing s/p botox injections.  Patient has been treated for neck spasms with botox neck injections. He have had 2 uneventful injections in the past. On 10/08 he had botox injections to his neck. 2 days later he started to have progressive swallowing difficulties until eventually over the period of days he have had trouble with swallowing fluids. He is able to eat apple souse. He states after eating he starts to cough things back up. He have had a Esophagram (regular barium swallow) done today that showed aspiration. PMH:  Parkinson's Disease, has a past medical history of Hypertension; Hyperlipidemia; Aneurysm of infrarenal abdominal aorta (SUPRAILIAC  4CM PER CT 10-20-2010--  STABLE); Bladder cancer (RECURRENT   (FIRST DX  2001));  Frequency of urination; Nocturia; and Orthostatic hypotension.     Assessment / Plan / Recommendation Clinical Impression  Pt. exhibits s/s of laryngeal residue after the swallow with puree, honey and nectar thick liquids, with muliple repeat dry swallows and sensation of food/liquid "sticking" in throat.  Thin liquids and solids were not given.  Thin barium (which is actually more of a nectar thick liquid) was observed to be silently aspirated on the esophagram yesterday.  An objective study is indicated to determine if pt. is safe to resume po's.      Aspiration Risk  Moderate    Diet Recommendation NPO;Alternative means - temporary   Medication Administration: Via alternative means    Other  Recommendations Recommended Consults: MBS   Follow Up Recommendations  24 hour supervision/assistance    Frequency and Duration        Pertinent Vitals/Pain n/a    SLP Swallow Goals  Pending MBS results   Swallow Study Prior Functional Status       General Date of Onset:  (s/p 3 botox injection 58/40) HPI: 79 year old male adm. with difficulty swallowing s/p botox injections.  Patient has been treated for neck spasms with botox neck injections. He have had 2 uneventful injections in the past. On 10/08 he had botox injections to his neck. 2 days later he started to have progressive swallowing difficulties until eventually over the period of days he have had trouble with swallowing fluids. He is able to eat apple souse. He states after  eating he starts to cough things back up. He have had a Esophagram (regular barium swallow) done today that showed aspiration. PMH:  Parkinson's Disease, has a past medical history of Hypertension; Hyperlipidemia; Aneurysm of infrarenal abdominal aorta (SUPRAILIAC  4CM PER CT 10-20-2010--  STABLE); Bladder cancer (RECURRENT  (FIRST DX  2001));  Frequency of urination; Nocturia; and Orthostatic hypotension.   Type of Study: Bedside swallow  evaluation Previous Swallow Assessment: Esophagram of 10/20 revealed silent aspiration of thin liquids Diet Prior to this Study: NPO;Panda;IV Temperature Spikes Noted: No Respiratory Status: Nasal cannula History of Recent Intubation: No Behavior/Cognition: Alert;Cooperative;Pleasant mood Oral Cavity - Dentition: Adequate natural dentition Self-Feeding Abilities: Able to feed self Patient Positioning: Upright in bed Baseline Vocal Quality: Clear Volitional Cough: Weak Volitional Swallow: Able to elicit    Oral/Motor/Sensory Function Overall Oral Motor/Sensory Function: Impaired Labial ROM: Reduced right;Reduced left   Ice Chips     Thin Liquid      Nectar Thick     Honey Thick Honey Thick Liquid: Impaired Presentation: Spoon Pharyngeal Phase Impairments: Suspected delayed Swallow;Decreased hyoid-laryngeal movement;Multiple swallows (Globus sensation)   Puree Puree: Impaired Presentation: Spoon Pharyngeal Phase Impairments: Suspected delayed Swallow;Decreased hyoid-laryngeal movement;Multiple swallows;Throat Clearing - Delayed ("Feels like it's sticking")   Solid   GO    Solid: Not tested       Maryjo Rochester T 03/01/2013,4:28 PM

## 2013-03-01 NOTE — ED Notes (Signed)
Floor notified of delay of transfer.

## 2013-03-01 NOTE — ED Notes (Signed)
Admitting at bedside 

## 2013-03-01 NOTE — H&P (Addendum)
PCP:   Duane Lope, MD  Neurologist: Terrace Arabia  Chief Complaint:  Shanon Rosser swallow  HPI: Ivan Clark is a 79 y.o. male   has a past medical history of Hypertension; Hyperlipidemia; Aneurysm of infrarenal abdominal aorta (SUPRAILIAC  4CM PER CT 10-20-2010--  STABLE); Bladder cancer (RECURRENT  (FIRST DX  2001)); Parkinson disease; Frequency of urination; Nocturia; and Orthostatic hypotension.   Presented with  Patient has been treated for neck spasms with botox neck injections. He have had 2 uneventful injections in the past. On 10/08 he had botox injections to his neck. 2 days later he started to have progressive swallowing difficulties until eventually over the period of days he have had trouble with swallowing fluids. He is able to eat apple souse. He states after eating he starts to cough things back up. He have had a swallow eval done today that showed aspiration.   Review of Systems:    Pertinent positives include: cough, trouble with swallowing.   Constitutional:  No weight loss, night sweats, Fevers, chills, fatigue, weight loss  HEENT:  No headaches, Difficulty swallowing,Tooth/dental problems,Sore throat,  No sneezing, itching, ear ache, nasal congestion, post nasal drip,  Cardio-vascular:  No chest pain, Orthopnea, PND, anasarca, dizziness, palpitations.no Bilateral lower extremity swelling  GI:  No heartburn, indigestion, abdominal pain, nausea, vomiting, diarrhea, change in bowel habits, loss of appetite, melena, blood in stool, hematemesis Resp:  no shortness of breath at rest. No dyspnea on exertion, No excess mucus, no productive cough, No non-productive cough, No coughing up of blood.No change in color of mucus.No wheezing. Skin:  no rash or lesions. No jaundice GU:  no dysuria, change in color of urine, no urgency or frequency. No straining to urinate.  No flank pain.  Musculoskeletal:  No joint pain or no joint swelling. No decreased range of motion. No back pain.   Psych:  No change in mood or affect. No depression or anxiety. No memory loss.  Neuro: no localizing neurological complaints, no tingling, no weakness, no double vision, no gait abnormality, no slurred speech, no confusion  Otherwise ROS are negative except for above, 10 systems were reviewed  Past Medical History: Past Medical History  Diagnosis Date  . Hypertension   . Hyperlipidemia   . Aneurysm of infrarenal abdominal aorta SUPRAILIAC  4CM PER CT 10-20-2010--  STABLE  . Bladder cancer RECURRENT  (FIRST DX  2001)    HX TURBT AND CHEMO BLADDER INSTILLATION TX'S  . Parkinson disease   . Frequency of urination   . Nocturia   . Orthostatic hypotension    Past Surgical History  Procedure Laterality Date  . Tonsillectomy and adenoidectomy  1938  . Left inguinal hernia repair  1996  . Transurethral resection of bladder tumor  09-11-1999    BLADDER CANCER  . Cysto/ retrograde pyelogram/ bladder bx's  07-03-2004  . Cysto/ bladder bx/ fulgeration bladder tumor  10-09-2004  . Appendectomy  10-20-2010  . Cystoscopy with biopsy  11/28/2011    Procedure: CYSTOSCOPY WITH BIOPSY;  Surgeon: Antony Haste, MD;  Location: [facility];  Service: Urology;  Laterality: N/A;  . Vasectomy       Medications: Prior to Admission medications   Medication Sig Start Date End Date Taking? Authorizing Provider  amLODipine (NORVASC) 5 MG tablet Take 5 mg by mouth daily.   Yes Historical Provider, MD  aspirin EC 81 MG tablet Take 81 mg by mouth daily after supper.    Yes Historical Provider, MD  atorvastatin (LIPITOR) 20 MG tablet Take 20 mg by mouth daily. 02/07/13  Yes Historical Provider, MD  b complex vitamins tablet Take 1 tablet by mouth daily.   Yes Historical Provider, MD  carbidopa-levodopa (SINEMET CR) 50-200 MG per tablet Take 1 tablet by mouth at bedtime.   Yes Historical Provider, MD  carbidopa-levodopa (SINEMET IR) 25-100 MG per tablet Take 1 tablet by mouth 3  (three) times daily with meals.    Yes Historical Provider, MD  Cholecalciferol (VITAMIN D) 2000 UNITS CAPS Take 2,000 Units by mouth daily.   Yes Historical Provider, MD  dutasteride (AVODART) 0.5 MG capsule Take 0.5 mg by mouth daily after supper.    Yes Historical Provider, MD  irbesartan (AVAPRO) 150 MG tablet Take 150 mg by mouth 2 (two) times daily.    Yes Historical Provider, MD  Multiple Vitamin (MULTIVITAMIN WITH MINERALS) TABS Take 1 tablet by mouth daily.   Yes Historical Provider, MD  selegiline (ELDEPRYL) 5 MG capsule Take 5 mg by mouth 2 (two) times daily. One in the morning and one at noon.   Yes Historical Provider, MD  tamsulosin (FLOMAX) 0.4 MG CAPS capsule Take 0.4 mg by mouth daily after supper.   Yes Historical Provider, MD    Allergies:  No Known Allergies  Social History:  Ambulatory  independently   Lives at  Home with family   reports that he quit smoking about 13 years ago. His smoking use included Cigarettes. He has a 100 pack-year smoking history. He has never used smokeless tobacco. He reports that he does not drink alcohol or use illicit drugs.   Family History: family history includes Alzheimer's disease in his mother; Heart failure in his father.    Physical Exam: Patient Vitals for the past 24 hrs:  BP Temp Temp src Pulse Resp SpO2 Height Weight  02/28/13 2300 179/73 mmHg - - 76 - 96 % - -  02/28/13 2236 - - - 79 20 97 % - -  02/28/13 2130 176/77 mmHg - - 72 - 98 % - -  02/28/13 2030 193/90 mmHg - - 77 - 97 % - -  02/28/13 2011 196/80 mmHg - - 78 20 98 % 5\' 7"  (1.702 m) 78.926 kg (174 lb)  02/28/13 1721 160/76 mmHg 98 F (36.7 C) Oral 94 18 95 % - -    1. General:  in No Acute distress 2. Psychological: Alert and  Oriented 3. Head/ENT:   Moist   Mucous Membranes                          Head Non traumatic, neck supple                          Normal  Dentition 4. SKIN:   decreased Skin turgor,  Skin clean Dry and intact no rash 5. Heart:  Regular rate and rhythm no Murmur, Rub or gallop 6. Lungs: Clear to auscultation bilaterally, no wheezes or crackles   7. Abdomen: Soft, non-tender, Non distended 8. Lower extremities: no clubbing, cyanosis, or edema 9. Neurologically Grossly intact, moving all 4 extremities equally 10. MSK: Normal range of motion  body mass index is 27.25 kg/(m^2).   Labs on Admission:   Recent Labs  02/28/13 2052  NA 139  K 3.9  CL 103  CO2 25  GLUCOSE 103*  BUN 24*  CREATININE 1.09  CALCIUM 9.4    Recent  Labs  02/28/13 2052  AST 22  ALT <5  ALKPHOS 71  BILITOT 1.0  PROT 7.1  ALBUMIN 3.7   No results found for this basename: LIPASE, AMYLASE,  in the last 72 hours  Recent Labs  02/28/13 2052  WBC 7.8  NEUTROABS 5.0  HGB 13.7  HCT 38.9*  MCV 90.3  PLT 234   No results found for this basename: CKTOTAL, CKMB, CKMBINDEX, TROPONINI,  in the last 72 hours No results found for this basename: TSH, T4TOTAL, FREET3, T3FREE, THYROIDAB,  in the last 72 hours No results found for this basename: VITAMINB12, FOLATE, FERRITIN, TIBC, IRON, RETICCTPCT,  in the last 72 hours No results found for this basename: HGBA1C    Estimated Creatinine Clearance: 49.7 ml/min (by C-G formula based on Cr of 1.09). ABG    Component Value Date/Time   HCO3 26.5* 12/22/2006 2045   TCO2 25 10/20/2010 0829     No results found for this basename: DDIMER      Cultures:    Component Value Date/Time   SDES URINE, CLEAN CATCH 10/20/2010 0857   SPECREQUEST NONE 10/20/2010 0857   CULT Multiple bacterial morphotypes present, none predominant. Suggest appropriate recollection if clinically indicated. 10/20/2010 0857   REPTSTATUS 10/21/2010 FINAL 10/20/2010 0857       Radiological Exams on Admission: Dg Chest 2 View  03/01/2013   CLINICAL DATA:  Difficulty swallowing. Unable to swallow after both months injection. Aspiration on barium swallow.  EXAM: CHEST  2 VIEW  COMPARISON:  07/14/2012  FINDINGS: Mild  emphysematous changes in the lungs. Scattered fibrosis. Normal heart size and pulmonary vascularity. No focal airspace disease or consolidation in the lungs. No blunting of costophrenic angles. No pneumothorax. Mediastinal contours appear intact. Residual contrast material in the stomach. Old right rib fractures. Calcification of the aorta.  IMPRESSION: No active cardiopulmonary disease.   Electronically Signed   By: Burman Nieves M.D.   On: 03/01/2013 00:41   Dg Esophagus  02/28/2013   CLINICAL DATA:  Parkinson's. Recent neck but tox injection. Now with difficulty swallowing. Rule out aspiration  EXAM: ESOPHOGRAM/BARIUM SWALLOW  TECHNIQUE: Single contrast examination was performed using  thin barium.  COMPARISON:  None.  FLUOROSCOPY TIME:  0 Min and 56 seconds  FINDINGS: Patient has marked thoracic kyphosis.  Initially there was penetration. Subsequently there was mild aspiration. There is no coughing.  Negative for esophageal stricture or mass. GE junction normal without hiatal hernia. I do not test the patient for reflux.  IMPRESSION: Mild aspiration is present. Negative for cough reflux.   Electronically Signed   By: Marlan Palau M.D.   On: 02/28/2013 23:16    Chart has been reviewed  Assessment/Plan  79 yo M with dysphagia secondary to Botox injections   Present on Admission:  . Dysphasia will need NG placement can attempt to use Panda tube for now and crush meds. If prolonged use of panda tube will be required placement of PEG may become an option . Unspecified essential hypertension - will continue home meds Norvasc and avapro per panda tube . Parkinsonism - Will attempt to replace selegiline with oral disintegrating, Titrate IR sinemet could also transition to transdermal medication under neurologist guidance.     Prophylaxis: Lovenox, Protonix  CODE STATUS: FULL CODE for now and the patient will think about it some more.   Other plan as per orders.  I have spent a total of 65  min on this admission, time taken to discuss care with  radiology and pharmacy  Mazella Deen 03/01/2013, 1:22 AM

## 2013-03-01 NOTE — Progress Notes (Signed)
Patient admitted earlier this am by my associate. Please refer to her H and P for further details.  I have consulted Neurology for further evaluation and recommendations given patient's history of Parkinson's.    Consulted speech therapy.  Our team will reassess next am.  Ivan Clark

## 2013-03-01 NOTE — Consult Note (Signed)
NEURO HOSPITALIST CONSULT NOTE    Reason for Consult: recommendations on parkinson's medications.   HPI:                                                                                                                                          Ivan Clark is an 79 y.o. male followed closely by Dr. Terrace Arabia (neurology) as out patient for PD and   Since 2012, wife noticed that he has slowed down significantly, has difficulty initiate gait, some small shuffling gait, occasionally bilateral hands tremor, recent few months, also developed frequent lightheadedness, most often when getting up from sitting position, no passing out, but frequent occurring, patient described lightheaded funny feelings throughout his body during those episode.  Per Dr. Catalina Gravel note he was started him on Sinemet 25/100 3 times a day and 50/200 every night, he tolerate the medication well, there is mild improvement in his gait, he underwent emergency appendectomy in June 2013, recovered well from his surgery. He was started on selegiline 5 mg BID since 04/2012.  He had his first EMG guided butulinum toxin injection in March 19th 2014, for his anterocollis.   On 02-16-13 patient underwent a EMG guided Botox a injection for his antercollis.  Per notes He had Right sternocleidomastoid clavicular head,25 x2= 50 units, sternum head 25 units and Left sternocleidomastoid 25 units.  On 02-21-13 office notes state he had called [facility] due to  "difficulty swallowing, especially over the weekend, he was given soft food. He has no problem with his medications, He complains of mucus collection, difficulty to get it out."  He was advised at that time stay on soft foods. "He was also told his swallowing difficulty is likely secondary to Botox and will fade in 4-8 weeks." On 10.20.14, Dr Terrace Arabia also discussed with Dr. Tenny Craw (primary care) and they set him up with speech therapy.  Again he was notified this should fade in 4-8 weeks. Patient  was brought to the ED on 02-28-13 due to being unable to get barium swallow for one week.  Patient was admitted to hospital. Neurology was asked for guidance about his medications.       Past Medical History  Diagnosis Date  . Hypertension   . Hyperlipidemia   . Aneurysm of infrarenal abdominal aorta SUPRAILIAC  4CM PER CT 10-20-2010--  STABLE  . Bladder cancer RECURRENT  (FIRST DX  2001)    HX TURBT AND CHEMO BLADDER INSTILLATION TX'S  . Parkinson disease   . Frequency of urination   . Nocturia   . Orthostatic hypotension     Past Surgical History  Procedure Laterality Date  . Tonsillectomy and adenoidectomy  1938  . Left inguinal hernia repair  1996  . Transurethral resection of bladder tumor  09-11-1999  BLADDER CANCER  . Cysto/ retrograde pyelogram/ bladder bx's  07-03-2004  . Cysto/ bladder bx/ fulgeration bladder tumor  10-09-2004  . Appendectomy  10-20-2010  . Cystoscopy with biopsy  11/28/2011    Procedure: CYSTOSCOPY WITH BIOPSY;  Surgeon: Antony Haste, MD;  Location: [facility];  Service: Urology;  Laterality: N/A;  . Vasectomy      Family History  Problem Relation Age of Onset  . Heart failure Father   . Alzheimer's disease Mother      Social History:  reports that he quit smoking about 13 years ago. His smoking use included Cigarettes. He has a 100 pack-year smoking history. He has never used smokeless tobacco. He reports that he does not drink alcohol or use illicit drugs.  No Known Allergies  MEDICATIONS:                                                                                                                     Prior to Admission:  Prescriptions prior to admission  Medication Sig Dispense Refill  . amLODipine (NORVASC) 5 MG tablet Take 5 mg by mouth daily.      [name] aspirin EC 81 MG tablet Take 81 mg by mouth daily after supper.       [name] atorvastatin (LIPITOR) 20 MG tablet Take 20 mg by mouth daily.      [name] b complex  vitamins tablet Take 1 tablet by mouth daily.      . carbidopa-levodopa (SINEMET CR) 50-200 MG per tablet Take 1 tablet by mouth at bedtime.      . carbidopa-levodopa (SINEMET IR) 25-100 MG per tablet Take 1 tablet by mouth 3 (three) times daily with meals.       . Cholecalciferol (VITAMIN D) 2000 UNITS CAPS Take 2,000 Units by mouth daily.      [name] dutasteride (AVODART) 0.5 MG capsule Take 0.5 mg by mouth daily after supper.       . irbesartan (AVAPRO) 150 MG tablet Take 150 mg by mouth 2 (two) times daily.       . Multiple Vitamin (MULTIVITAMIN WITH MINERALS) TABS Take 1 tablet by mouth daily.      . selegiline (ELDEPRYL) 5 MG capsule Take 5 mg by mouth 2 (two) times daily. One in the morning and one at noon.      . tamsulosin (FLOMAX) 0.4 MG CAPS capsule Take 0.4 mg by mouth daily after supper.       Scheduled: . amLODipine  5 mg Per Tube Daily  . aspirin  81 mg Per Tube Daily  . atorvastatin  20 mg Per Tube Daily  . carbidopa-levodopa  1 tablet Per Tube QID  . enoxaparin (LOVENOX) injection  40 mg Subcutaneous Q24H  . irbesartan  150 mg Per Tube BID  . pantoprazole (PROTONIX) IV  40 mg Intravenous QHS  . selegiline  5 mg Per Tube BID WC     ROS:  History obtained from the patient  General ROS: negative for - chills, fatigue, fever, night sweats, weight gain or weight loss Psychological ROS: negative for - behavioral disorder, hallucinations, memory difficulties, mood swings or suicidal ideation Ophthalmic ROS: negative for - blurry vision, double vision, eye pain or loss of vision ENT ROS: negative for - epistaxis, nasal discharge, oral lesions, sore throat, tinnitus or vertigo Allergy and Immunology ROS: negative for - hives or itchy/watery eyes Hematological and Lymphatic ROS: negative for - bleeding problems, bruising or swollen lymph nodes Endocrine  ROS: negative for - galactorrhea, hair pattern changes, polydipsia/polyuria or temperature intolerance Respiratory ROS: negative for - cough, hemoptysis, shortness of breath or wheezing Cardiovascular ROS: negative for - chest pain, dyspnea on exertion, edema or irregular heartbeat Gastrointestinal ROS: negative for - abdominal pain, diarrhea, hematemesis, nausea/vomiting or stool incontinence Genito-Urinary ROS: negative for - dysuria, hematuria, incontinence or urinary frequency/urgency Musculoskeletal ROS: negative for - joint swelling or muscular weakness Neurological ROS: as noted in HPI Dermatological ROS: negative for rash and skin lesion changes   Blood pressure 150/67, pulse 74, temperature 97.8 F (36.6 C), temperature source Oral, resp. rate 20, height 5\' 7"  (1.702 m), weight 78.926 kg (174 lb), SpO2 96.00%.   Neurologic Examination:                                                                                                      Mental Status: Alert, oriented, thought content appropriate.  Speech fluent without evidence of aphasia.  Able to follow 3 step commands without difficulty. Cranial Nerves: II: Discs flat bilaterally; Visual fields grossly normal, pupils equal, round, reactive to light and accommodation III,IV, VI: ptosis not present, extra-ocular motions intact bilaterally V,VII: smile symmetric, facial light touch sensation normal bilaterally VIII: hearing normal bilaterally XI: bilateral shoulder shrug XII: midline tongue extension without atrophy or fasciculations  Motor: Right : Upper extremity   5/5    Left:     Upper extremity   5/5  Lower extremity   5/5     Lower extremity   5/5 Tone and bulk:normal tone throughout; no atrophy noted Sensory: Pinprick and light touch intact throughout, bilaterally Deep Tendon Reflexes:  Right: Upper Extremity   Left: Upper extremity   biceps (C-5 to C-6) 2/4   biceps (C-5 to C-6) 2/4 tricep (C7) 2/4    triceps (C7)  2/4 Brachioradialis (C6) 2/4  Brachioradialis (C6) 2/4  Lower Extremity Lower Extremity  quadriceps (L-2 to L-4) 0/4   quadriceps (L-2 to L-4) 0/4 Achilles (S1) 0/4   Achilles (S1) 0/4  Plantars: Right: downgoing   Left: downgoing Cerebellar: normal finger-to-nose,  normal heel-to-shin test CV: pulses palpable throughout    No results found for this basename: cbc, bmp, coags, chol, tri, ldl, hga1c    Results for orders placed during the hospital encounter of 02/28/13 (from the past 48 hour(s))  CBC WITH DIFFERENTIAL     Status: Abnormal   Collection Time    02/28/13  8:52 PM      Result Value Range   WBC 7.8  4.0 -  10.5 K/uL   RBC 4.31  4.22 - 5.81 MIL/uL   Hemoglobin 13.7  13.0 - 17.0 g/dL   HCT 16.1 (*) 09.6 - 04.5 %   MCV 90.3  78.0 - 100.0 fL   MCH 31.8  26.0 - 34.0 pg   MCHC 35.2  30.0 - 36.0 g/dL   RDW 40.9  81.1 - 91.4 %   Platelets 234  150 - 400 K/uL   Neutrophils Relative % 63  43 - 77 %   Neutro Abs 5.0  1.7 - 7.7 K/uL   Lymphocytes Relative 27  12 - 46 %   Lymphs Abs 2.1  0.7 - 4.0 K/uL   Monocytes Relative 7  3 - 12 %   Monocytes Absolute 0.6  0.1 - 1.0 K/uL   Eosinophils Relative 2  0 - 5 %   Eosinophils Absolute 0.2  0.0 - 0.7 K/uL   Basophils Relative 0  0 - 1 %   Basophils Absolute 0.0  0.0 - 0.1 K/uL  COMPREHENSIVE METABOLIC PANEL     Status: Abnormal   Collection Time    02/28/13  8:52 PM      Result Value Range   Sodium 139  135 - 145 mEq/L   Potassium 3.9  3.5 - 5.1 mEq/L   Chloride 103  96 - 112 mEq/L   CO2 25  19 - 32 mEq/L   Glucose, Bld 103 (*) 70 - 99 mg/dL   BUN 24 (*) 6 - 23 mg/dL   Creatinine, Ser 7.82  0.50 - 1.35 mg/dL   Calcium 9.4  8.4 - 95.6 mg/dL   Total Protein 7.1  6.0 - 8.3 g/dL   Albumin 3.7  3.5 - 5.2 g/dL   AST 22  0 - 37 U/L   ALT <5  0 - 53 U/L   Comment: REPEATED TO VERIFY   Alkaline Phosphatase 71  39 - 117 U/L   Total Bilirubin 1.0  0.3 - 1.2 mg/dL   GFR calc non Af Amer 62 (*) >90 mL/min   GFR calc Af Amer 71  (*) >90 mL/min   Comment: (NOTE)     The eGFR has been calculated using the CKD EPI equation.     This calculation has not been validated in all clinical situations.     eGFR's persistently <90 mL/min signify possible Chronic Kidney     Disease.  URINALYSIS, ROUTINE W REFLEX MICROSCOPIC     Status: Abnormal   Collection Time    03/01/13 12:30 AM      Result Value Range   Color, Urine YELLOW  YELLOW   APPearance HAZY (*) CLEAR   Specific Gravity, Urine 1.020  1.005 - 1.030   pH 6.5  5.0 - 8.0   Glucose, UA NEGATIVE  NEGATIVE mg/dL   Hgb urine dipstick NEGATIVE  NEGATIVE   Bilirubin Urine NEGATIVE  NEGATIVE   Ketones, ur NEGATIVE  NEGATIVE mg/dL   Protein, ur >213 (*) NEGATIVE mg/dL   Urobilinogen, UA 1.0  0.0 - 1.0 mg/dL   Nitrite NEGATIVE  NEGATIVE   Leukocytes, UA NEGATIVE  NEGATIVE  URINE MICROSCOPIC-ADD ON     Status: Abnormal   Collection Time    03/01/13 12:30 AM      Result Value Range   Squamous Epithelial / LPF FEW (*) RARE   WBC, UA 0-2  <3 WBC/hpf   RBC / HPF 0-2  <3 RBC/hpf   Bacteria, UA FEW (*) RARE   Casts HYALINE  CASTS (*) NEGATIVE   Comment: GRANULAR CAST   Crystals CA OXALATE CRYSTALS (*) NEGATIVE   Urine-Other MUCOUS PRESENT     Comment: AMORPHOUS URATES/PHOSPHATES  MAGNESIUM     Status: None   Collection Time    03/01/13  5:27 AM      Result Value Range   Magnesium 1.8  1.5 - 2.5 mg/dL  PHOSPHORUS     Status: None   Collection Time    03/01/13  5:27 AM      Result Value Range   Phosphorus 2.7  2.3 - 4.6 mg/dL  COMPREHENSIVE METABOLIC PANEL     Status: Abnormal   Collection Time    03/01/13  5:27 AM      Result Value Range   Sodium 138  135 - 145 mEq/L   Potassium 3.7  3.5 - 5.1 mEq/L   Chloride 102  96 - 112 mEq/L   CO2 26  19 - 32 mEq/L   Glucose, Bld 97  70 - 99 mg/dL   BUN 21  6 - 23 mg/dL   Creatinine, Ser 1.61  0.50 - 1.35 mg/dL   Calcium 9.0  8.4 - 09.6 mg/dL   Total Protein 6.6  6.0 - 8.3 g/dL   Albumin 3.6  3.5 - 5.2 g/dL   AST 20   0 - 37 U/L   ALT <5  0 - 53 U/L   Alkaline Phosphatase 70  39 - 117 U/L   Total Bilirubin 1.0  0.3 - 1.2 mg/dL   GFR calc non Af Amer 64 (*) >90 mL/min   GFR calc Af Amer 74 (*) >90 mL/min   Comment: (NOTE)     The eGFR has been calculated using the CKD EPI equation.     This calculation has not been validated in all clinical situations.     eGFR's persistently <90 mL/min signify possible Chronic Kidney     Disease.  CBC     Status: Abnormal   Collection Time    03/01/13  5:27 AM      Result Value Range   WBC 6.7  4.0 - 10.5 K/uL   RBC 4.19 (*) 4.22 - 5.81 MIL/uL   Hemoglobin 13.1  13.0 - 17.0 g/dL   HCT 04.5 (*) 40.9 - 81.1 %   MCV 90.9  78.0 - 100.0 fL   MCH 31.3  26.0 - 34.0 pg   MCHC 34.4  30.0 - 36.0 g/dL   RDW 91.4  78.2 - 95.6 %   Platelets 225  150 - 400 K/uL    Dg Chest 2 View  03/01/2013   CLINICAL DATA:  Difficulty swallowing. Unable to swallow after both months injection. Aspiration on barium swallow.  EXAM: CHEST  2 VIEW  COMPARISON:  07/14/2012  FINDINGS: Mild emphysematous changes in the lungs. Scattered fibrosis. Normal heart size and pulmonary vascularity. No focal airspace disease or consolidation in the lungs. No blunting of costophrenic angles. No pneumothorax. Mediastinal contours appear intact. Residual contrast material in the stomach. Old right rib fractures. Calcification of the aorta.  IMPRESSION: No active cardiopulmonary disease.   Electronically Signed   By: Burman Nieves M.D.   On: 03/01/2013 00:41   Dg Esophagus  02/28/2013   CLINICAL DATA:  Parkinson's. Recent neck but tox injection. Now with difficulty swallowing. Rule out aspiration  EXAM: ESOPHOGRAM/BARIUM SWALLOW  TECHNIQUE: Single contrast examination was performed using  thin barium.  COMPARISON:  None.  FLUOROSCOPY TIME:  0 Min and 56 seconds  FINDINGS: Patient has marked thoracic kyphosis.  Initially there was penetration. Subsequently there was mild aspiration. There is no coughing.   Negative for esophageal stricture or mass. GE junction normal without hiatal hernia. I do not test the patient for reflux.  IMPRESSION: Mild aspiration is present. Negative for cough reflux.   Electronically Signed   By: Marlan Palau M.D.   On: 02/28/2013 23:16   Dg Abd Portable 1v  03/01/2013   CLINICAL DATA:  Handle placement.  EXAM: PORTABLE ABDOMEN - 1 VIEW  COMPARISON:  None.  FINDINGS: Enteric tube tip is in the left upper quadrant consistent with location in the upper stomach. Residual contrast material in the small bowel and colon. No small or large bowel distention.  IMPRESSION: Enteric tube tip is in the left upper quadrant consistent with location in the upper stomach.   Electronically Signed   By: Burman Nieves M.D.   On: 03/01/2013 03:59     Assessment/Plan: 79 YO male with dysphagia secondary to Botox injection.  Swallow evaluation showed silent aspiration and patient has PANDA tube placed.  Currently he has been gettiing his IR sinemet but was unable to get his ER sinemet per tube.  He has been getting his Selegiline per tube without complications.   Recommend: 1) Continue Sinemet 25/100 Per tube at 0800/1230/1800 2) Give Sinemet 25/100 at 2330 and 0300 (in place of Sinemet 50/100 ER nightly) 3) Continue Selegiline per tube  Neurology S/O  Assessment and plan discussed with with attending physician and they are in agreement.    Felicie Morn PA-C [facility] [phone]  03/01/2013, 10:41 AM  I personally participated in this patient's evaluation and management including the above clinical assessment and management recommendations.  Venetia Maxon M.D. [facility] [phone]

## 2013-03-02 ENCOUNTER — Ambulatory Visit: Payer: Medicare Other | Admitting: Neurology

## 2013-03-02 ENCOUNTER — Inpatient Hospital Stay (HOSPITAL_COMMUNITY): Payer: Medicare Other

## 2013-03-02 DIAGNOSIS — G243 Spasmodic torticollis: Secondary | ICD-10-CM

## 2013-03-02 DIAGNOSIS — G20 Parkinson's disease: Secondary | ICD-10-CM

## 2013-03-02 DIAGNOSIS — G20A1 Parkinson's disease without dyskinesia, without mention of fluctuations: Secondary | ICD-10-CM

## 2013-03-02 DIAGNOSIS — R1313 Dysphagia, pharyngeal phase: Principal | ICD-10-CM

## 2013-03-02 DIAGNOSIS — I10 Essential (primary) hypertension: Secondary | ICD-10-CM

## 2013-03-02 LAB — GLUCOSE, CAPILLARY
Glucose-Capillary: 108 mg/dL — ABNORMAL HIGH (ref 70–99)
Glucose-Capillary: 114 mg/dL — ABNORMAL HIGH (ref 70–99)
Glucose-Capillary: 119 mg/dL — ABNORMAL HIGH (ref 70–99)

## 2013-03-02 MED ORDER — AMLODIPINE BESYLATE 10 MG PO TABS
10.0000 mg | ORAL_TABLET | Freq: Every day | ORAL | Status: DC
  Administered 2013-03-03: 10 mg
  Filled 2013-03-02: qty 1

## 2013-03-02 NOTE — Progress Notes (Signed)
Speech Language Pathology Treatment:    Patient Details Name: Ivan Clark MRN: [id number] DOB: Oct 14, 1928 Today's Date: 03/02/2013 Time: 1100-1130 SLP Time Calculation (min): 30 min  Assessment / Plan / Recommendation Clinical Impression  Pt. is eager to attempt po diet, and hopes to avoid PEG.  No cough was observed with sips of honey thick liquids after education regarding compensatory strategies.  Pt. was able to follow aspiration precautions, utilizing multiple dry swallows to clear pharyngeal residue.  Family agrees with treatment plan, and would like Nurtrition f/u re: calorie count.   HPI HPI: 79 year old male adm. with difficulty swallowing s/p botox injections.  Patient has been treated for neck spasms with botox neck injections. He have had 2 uneventful injections in the past. On 10/08 he had botox injections to his neck. 2 days later he started to have progressive swallowing difficulties until eventually over the period of days he have had trouble with swallowing fluids. He is able to eat apple souse. He states after eating he starts to cough things back up. He have had a Esophagram (regular barium swallow) done today that showed aspiration. PMH:  Parkinson's Disease, has a past medical history of Hypertension; Hyperlipidemia; Aneurysm of infrarenal abdominal aorta (SUPRAILIAC  4CM PER CT 10-20-2010--  STABLE); Bladder cancer (RECURRENT  (FIRST DX  2001));  Frequency of urination; Nocturia; and Orthostatic hypotension.     Pertinent Vitals n/a  SLP Plan  Continue with current plan of care    Recommendations Liquids provided via: Cup;No straw Medication Administration: Crushed with puree Supervision: Full supervision/cueing for compensatory strategies Compensations: Slow rate;Small sips/bites;Multiple dry swallows after each bite/sip;Follow solids with liquid;Effortful swallow;Clear throat intermittently Postural Changes and/or Swallow Maneuvers: Out of bed for meals;Seated  upright 90 degrees;Upright 30-60 min after meal              Oral Care Recommendations: Oral care BID Follow up Recommendations: 24 hour supervision/assistance Plan: Continue with current plan of care    GO     Maryjo Rochester T 03/02/2013, 11:51 AM

## 2013-03-02 NOTE — Procedures (Signed)
Objective Swallowing Evaluation: Modified Barium Swallowing Study  Patient Details  Name: Ivan Clark MRN: [id number] Date of Birth: Dec 01, 1928  Today's Date: 03/02/2013 Time: 1000-1035 SLP Time Calculation (min): 35 min  Past Medical History:  Past Medical History  Diagnosis Date  . Hypertension   . Hyperlipidemia   . Aneurysm of infrarenal abdominal aorta SUPRAILIAC  4CM PER CT 10-20-2010--  STABLE  . Bladder cancer RECURRENT  (FIRST DX  2001)    HX TURBT AND CHEMO BLADDER INSTILLATION TX'S  . Parkinson disease   . Frequency of urination   . Nocturia   . Orthostatic hypotension    Past Surgical History:  Past Surgical History  Procedure Laterality Date  . Tonsillectomy and adenoidectomy  1938  . Left inguinal hernia repair  1996  . Transurethral resection of bladder tumor  09-11-1999    BLADDER CANCER  . Cysto/ retrograde pyelogram/ bladder bx's  07-03-2004  . Cysto/ bladder bx/ fulgeration bladder tumor  10-09-2004  . Appendectomy  10-20-2010  . Cystoscopy with biopsy  11/28/2011    Procedure: CYSTOSCOPY WITH BIOPSY;  Surgeon: Antony Haste, MD;  Location: [facility];  Service: Urology;  Laterality: N/A;  . Vasectomy     HPI:  79 year old male adm. with difficulty swallowing s/p botox injections.  Patient has been treated for neck spasms with botox neck injections. He have had 2 uneventful injections in the past. On 10/08 he had botox injections to his neck. 2 days later he started to have progressive swallowing difficulties until eventually over the period of days he have had trouble with swallowing fluids. He is able to eat apple souse. He states after eating he starts to cough things back up. He have had a Esophagram (regular barium swallow) done today that showed aspiration. PMH:  Parkinson's Disease, has a past medical history of Hypertension; Hyperlipidemia; Aneurysm of infrarenal abdominal aorta (SUPRAILIAC  4CM PER CT 10-20-2010--   STABLE); Bladder cancer (RECURRENT  (FIRST DX  2001));  Frequency of urination; Nocturia; and Orthostatic hypotension.       Assessment / Plan / Recommendation Clinical Impression  Dysphagia Diagnosis: Moderate pharyngeal phase dysphagia;Moderate cervical esophageal phase dysphagia Clinical impression: Pt. exhibits a moderate pharyngeal and cervical esophageal dysphagia, characterized by decreased laryngeal elevation and hyoid excursion during the swallow, resulting in moderate residue with most consistencies on the UES and in the pyriform sinues.  The thin and nectar thick liquids spill into the airway after the swallow before the pt. was able to dry swallow in effort to clear the residue.  Pt. had good sensation, and consistently coughed with penetration/aspiration.   With multiple dry swallows, pt. was able to clear purees, soft solids, and honey thick liquids without penetration/aspiration.  Pt. will need increased time for meals.  Recommend Doppoff feeding tube remain in place and calorie count be completed prior to removing tube to ensure adequate intake.  Hopefully, pt. can maintain adequate po's to avoid need for PEG.  Will need continued SLP for gradual advancment of diet as swallow function returns.     Treatment Recommendation  Therapy as outlined in treatment plan below    Diet Recommendation Dysphagia 2 (Fine chop);Honey-thick liquid   Liquid Administration via: Cup;No straw Medication Administration: Crushed with puree Compensations: Slow rate;Small sips/bites;Multiple dry swallows after each bite/sip;Follow solids with liquid;Effortful swallow;Clear throat intermittently (Cough as needed to clear penetrates) Postural Changes and/or Swallow Maneuvers: Out of bed for meals;Seated upright 90 degrees;Upright 30-60 min after meal  Other  Recommendations Recommended Consults:  (Nutrition-Calorie Count) Oral Care Recommendations: Oral care BID Other Recommendations: Order thickener from  pharmacy;Prohibited food (jello, ice cream, thin soups);Have oral suction available;Clarify dietary restrictions   Follow Up Recommendations  24 hour supervision/assistance    Frequency and Duration min 3x week  2 weeks   Pertinent Vitals/Pain n/a    SLP Swallow Goals  See care plan   General HPI: 79 year old male adm. with difficulty swallowing s/p botox injections.  Patient has been treated for neck spasms with botox neck injections. He have had 2 uneventful injections in the past. On 10/08 he had botox injections to his neck. 2 days later he started to have progressive swallowing difficulties until eventually over the period of days he have had trouble with swallowing fluids. He is able to eat apple souse. He states after eating he starts to cough things back up. He have had a Esophagram (regular barium swallow) done today that showed aspiration. PMH:  Parkinson's Disease, has a past medical history of Hypertension; Hyperlipidemia; Aneurysm of infrarenal abdominal aorta (SUPRAILIAC  4CM PER CT 10-20-2010--  STABLE); Bladder cancer (RECURRENT  (FIRST DX  2001));  Frequency of urination; Nocturia; and Orthostatic hypotension.   Type of Study: Modified Barium Swallowing Study Reason for Referral: Objectively evaluate swallowing function Previous Swallow Assessment: BSE 10/21; Esophagram 10/20. Diet Prior to this Study: NPO Temperature Spikes Noted: No Respiratory Status: Nasal cannula History of Recent Intubation: No Behavior/Cognition: Alert;Cooperative;Pleasant mood Oral Cavity - Dentition: Adequate natural dentition Oral Motor / Sensory Function: Within functional limits Self-Feeding Abilities: Able to feed self Patient Positioning: Upright in chair Baseline Vocal Quality: Clear Volitional Cough: Weak Volitional Swallow: Able to elicit Anatomy: Within functional limits Pharyngeal Secretions: Standing secretions in (comment) (pyriforms (noted after swallowing barium))    Reason for  Referral Objectively evaluate swallowing function   Oral Phase Oral Preparation/Oral Phase Oral Phase: WFL   Pharyngeal Phase Pharyngeal Phase Pharyngeal Phase: Impaired Pharyngeal - Honey Pharyngeal - Honey Teaspoon: Reduced anterior laryngeal mobility;Reduced laryngeal elevation;Pharyngeal residue - pyriform sinuses;Pharyngeal residue - cp segment;Compensatory strategies attempted (Comment);Inter-arytenoid space residue (Multiple dry swallows clears most residue.) Pharyngeal - Honey Cup: Reduced anterior laryngeal mobility;Reduced laryngeal elevation;Penetration/Aspiration after swallow;Pharyngeal residue - pyriform sinuses;Pharyngeal residue - cp segment;Compensatory strategies attempted (Comment);Inter-arytenoid space residue Pharyngeal - Nectar Pharyngeal - Nectar Teaspoon: Reduced anterior laryngeal mobility;Reduced laryngeal elevation;Penetration/Aspiration after swallow;Pharyngeal residue - pyriform sinuses;Pharyngeal residue - cp segment;Compensatory strategies attempted (Comment);Inter-arytenoid space residue Pharyngeal - Nectar Cup: Reduced anterior laryngeal mobility;Reduced laryngeal elevation;Penetration/Aspiration after swallow;Pharyngeal residue - pyriform sinuses;Pharyngeal residue - cp segment;Compensatory strategies attempted (Comment);Inter-arytenoid space residue;Penetration/Aspiration during swallow Penetration/Aspiration details (nectar cup): Material enters airway, remains ABOVE vocal cords then ejected out Pharyngeal - Solids Pharyngeal - Puree: Reduced anterior laryngeal mobility;Reduced laryngeal elevation;Pharyngeal residue - pyriform sinuses;Pharyngeal residue - cp segment Pharyngeal - Mechanical Soft: Reduced anterior laryngeal mobility;Reduced laryngeal elevation  Cervical Esophageal Phase    GO    Cervical Esophageal Phase Cervical Esophageal Phase: Impaired Cervical Esophageal Phase - Honey Honey Cup: Reduced cricopharyngeal relaxation;Prominent  cricopharyngeal segment Cervical Esophageal Phase - Nectar Nectar Cup: Reduced cricopharyngeal relaxation;Prominent cricopharyngeal segment Cervical Esophageal Phase - Thin Thin Teaspoon: Reduced cricopharyngeal relaxation;Prominent cricopharyngeal segment Cervical Esophageal Phase - Solids Puree: Reduced cricopharyngeal relaxation;Prominent cricopharyngeal segment Cervical Esophageal Phase - Comment Cervical Esophageal Comment: Reduced laryngeal elevation and hyoid excursion results in decreased UES relaxation, with moderate residue with most consistencies on the UES and in the pyriform sinuses.  Maryjo Rochester T 03/02/2013, 11:41 AM

## 2013-03-02 NOTE — Progress Notes (Signed)
[facility] PROGRESS NOTE  Ivan Clark:[id number] DOB: [birth year] DOA: 02/28/2013 PCP:  Duane Lope, MD  Assessment/Plan: 1. Parkinson's disease; continue medication per neurology -PT/OT consult placed to determine patient's home needs   2. Dysphagia pharyngeal; patient has been started on dysphagia 2 (fine Chop) honey thick liquid -Will obtain 24-hour calorie count, in order to determine if patient requires supplemental feedings on discharge  3. HTN; increase amlodipine to 10 mg daily   Code Status: Full Family Communication: Wife present for discussion of plan of care Disposition Plan: Consider discharge in the a.m. after completing 24 hour calorie count   Consultants:  Neurology, nutrition  Procedures: Modified barium swallow 03/02/2013  Recommendations are as follows  -Dysphagia 2 (Fine chop);Honey-thick liquid  Liquid Administration via: Cup;No straw  Medication Administration: Crushed with puree  Compensations: Slow rate;Small sips/bites;Multiple dry swallows after each bite/sip;Follow solids with liquid;Effortful swallow;Clear throat intermittently (Cough as needed to clear penetrates)  Postural Changes and/or Swallow Maneuvers: Out of bed for meals;Seated upright 90 degrees;Upright 30-60 min after meal   Antibiotics:    HPI/Subjective: Ivan Clark 79 y.o.WM PMHx Hypertension; Hyperlipidemia; Aneurysm of infrarenal abdominal aorta (SUPRAILIAC 4CM PER CT 10-20-2010-- STABLE); Bladder cancer (RECURRENT (FIRST DX 2001)); Parkinson disease; Frequency of urination; Nocturia; and Orthostatic hypotension. On 02/28/2013 patient presented to ED following Botox injections to his neck for Anterocollis. He had  2 uneventful injections in the past. On 10/08 he had botox injections to his neck. 2 days later he started to have progressive swallowing difficulties until eventually over the period of days he have had trouble with swallowing fluids. He is able to eat  apple souse. He states after eating he starts to cough things back up. TODAY patient states was able to eat as much following his MBS. States able to ambulate on his own, lives at home with wife both retired. States able to perform all ADLs.   speech evaluate patient for swallow on 03/02/2013 and have the following recommendations post modified barium swallow  Dysphagia 2 (Fine chop);Honey-thick liquid  Liquid Administration via: Cup;No straw  Medication Administration: Crushed with puree  Compensations: Slow rate;Small sips/bites;Multiple dry swallows after each bite/sip;Follow solids with liquid;Effortful swallow;Clear throat intermittently (Cough as needed to clear penetrates)  Postural Changes and/or Swallow Maneuvers: Out of bed for meals;Seated upright 90 degrees;Upright 30-60 min after meal     Objective: Filed Vitals:   03/02/13 0153 03/02/13 0155 03/02/13 0500 03/02/13 0556  BP: 189/69 188/69  161/59  Pulse: 74   75  Temp: 98.1 F (36.7 C)   97.8 F (36.6 C)  TempSrc: Oral   Oral  Resp: 18   18  Height:      Weight:   78.971 kg (174 lb 1.6 oz)   SpO2: 96%   95%    Intake/Output Summary (Last 24 hours) at 03/02/13 1359 Last data filed at 03/02/13 1316  Gross per 24 hour  Intake    240 ml  Output    750 ml  Net   -510 ml   Filed Weights   02/28/13 2011 03/02/13 0500  Weight: 78.926 kg (174 lb) 78.971 kg (174 lb 1.6 oz)    Exam:   General:  A./O. x4, NAD  Cardiovascular: Regular rhythm and rate, negative murmurs rubs gallops, DP/PT pulse 2+ bilateral  Respiratory: Clear to auscultation bilateral  Abdomen: Soft, nontender, nondistended plus bowel sounds  Musculoskeletal: Negative pedal edema, NG tube present   Data Reviewed: Basic Metabolic Panel:  Recent  Labs Lab 02/28/13 2052 03/01/13 0527  NA 139 138  K 3.9 3.7  CL 103 102  CO2 25 26  GLUCOSE 103* 97  BUN 24* 21  CREATININE 1.09 1.06  CALCIUM 9.4 9.0  MG  --  1.8  PHOS  --  2.7   Liver  Function Tests:  Recent Labs Lab 02/28/13 2052 03/01/13 0527  AST 22 20  ALT <5 <5  ALKPHOS 71 70  BILITOT 1.0 1.0  PROT 7.1 6.6  ALBUMIN 3.7 3.6   No results found for this basename: LIPASE, AMYLASE,  in the last 168 hours No results found for this basename: AMMONIA,  in the last 168 hours CBC:  Recent Labs Lab 02/28/13 2052 03/01/13 0527  WBC 7.8 6.7  NEUTROABS 5.0  --   HGB 13.7 13.1  HCT 38.9* 38.1*  MCV 90.3 90.9  PLT 234 225   Cardiac Enzymes: No results found for this basename: CKTOTAL, CKMB, CKMBINDEX, TROPONINI,  in the last 168 hours BNP (last 3 results) No results found for this basename: PROBNP,  in the last 8760 hours CBG:  Recent Labs Lab 03/01/13 2002 03/01/13 2359 03/02/13 0516 03/02/13 0804 03/02/13 1154  GLUCAP 97 91 118* 92 114*    No results found for this or any previous visit (from the past 240 hour(s)).   Studies: Dg Chest 2 View  03/01/2013   CLINICAL DATA:  Difficulty swallowing. Unable to swallow after both months injection. Aspiration on barium swallow.  EXAM: CHEST  2 VIEW  COMPARISON:  07/14/2012  FINDINGS: Mild emphysematous changes in the lungs. Scattered fibrosis. Normal heart size and pulmonary vascularity. No focal airspace disease or consolidation in the lungs. No blunting of costophrenic angles. No pneumothorax. Mediastinal contours appear intact. Residual contrast material in the stomach. Old right rib fractures. Calcification of the aorta.  IMPRESSION: No active cardiopulmonary disease.   Electronically Signed   By: Burman Nieves M.D.   On: 03/01/2013 00:41   Dg Esophagus  02/28/2013   CLINICAL DATA:  Parkinson's. Recent neck but tox injection. Now with difficulty swallowing. Rule out aspiration  EXAM: ESOPHOGRAM/BARIUM SWALLOW  TECHNIQUE: Single contrast examination was performed using  thin barium.  COMPARISON:  None.  FLUOROSCOPY TIME:  0 Min and 56 seconds  FINDINGS: Patient has marked thoracic kyphosis.  Initially  there was penetration. Subsequently there was mild aspiration. There is no coughing.  Negative for esophageal stricture or mass. GE junction normal without hiatal hernia. I do not test the patient for reflux.  IMPRESSION: Mild aspiration is present. Negative for cough reflux.   Electronically Signed   By: Marlan Palau M.D.   On: 02/28/2013 23:16   Dg Abd Portable 1v  03/01/2013   CLINICAL DATA:  Handle placement.  EXAM: PORTABLE ABDOMEN - 1 VIEW  COMPARISON:  None.  FINDINGS: Enteric tube tip is in the left upper quadrant consistent with location in the upper stomach. Residual contrast material in the small bowel and colon. No small or large bowel distention.  IMPRESSION: Enteric tube tip is in the left upper quadrant consistent with location in the upper stomach.   Electronically Signed   By: Burman Nieves M.D.   On: 03/01/2013 03:59   Dg Swallowing Func-speech Pathology  03/02/2013   Lenor Derrick, CCC-SLP     03/02/2013 11:42 AM Objective Swallowing Evaluation: Modified Barium Swallowing Study   Patient Details  Name: Ivan Clark MRN: [id number] Date of Birth: [birth year]  Today's Date: 03/02/2013 Time: 1000-1035  SLP Time Calculation (min): 35 min  Past Medical History:  Past Medical History  Diagnosis Date  . Hypertension   . Hyperlipidemia   . Aneurysm of infrarenal abdominal aorta SUPRAILIAC  4CM PER CT  10-20-2010--  STABLE  . Bladder cancer RECURRENT  (FIRST DX  2001)    HX TURBT AND CHEMO BLADDER INSTILLATION TX'S  . Parkinson disease   . Frequency of urination   . Nocturia   . Orthostatic hypotension    Past Surgical History:  Past Surgical History  Procedure Laterality Date  . Tonsillectomy and adenoidectomy  1938  . Left inguinal hernia repair  1996  . Transurethral resection of bladder tumor  09-11-1999    BLADDER CANCER  . Cysto/ retrograde pyelogram/ bladder bx's  07-03-2004  . Cysto/ bladder bx/ fulgeration bladder tumor  10-09-2004  . Appendectomy  10-20-2010  . Cystoscopy with  biopsy  11/28/2011    Procedure: CYSTOSCOPY WITH BIOPSY;  Surgeon: Antony Haste, MD;  Location: [facility];  Service:  Urology;  Laterality: N/A;  . Vasectomy     HPI:  79 year old male adm. with difficulty swallowing s/p botox  injections.  Patient has been treated for neck spasms with botox  neck injections. He have had 2 uneventful injections in the past.  On 10/08 he had botox injections to his neck. 2 days later he  started to have progressive swallowing difficulties until  eventually over the period of days he have had trouble with  swallowing fluids. He is able to eat apple souse. He states after  eating he starts to cough things back up. He have had a  Esophagram (regular barium swallow) done today that showed  aspiration. PMH:  Parkinson's Disease, has a past medical history  of Hypertension; Hyperlipidemia; Aneurysm of infrarenal abdominal  aorta (SUPRAILIAC  4CM PER CT 10-20-2010--  STABLE); Bladder  cancer (RECURRENT  (FIRST DX  2001));  Frequency of urination;  Nocturia; and Orthostatic hypotension.       Assessment / Plan / Recommendation Clinical Impression  Dysphagia Diagnosis: Moderate pharyngeal phase  dysphagia;Moderate cervical esophageal phase dysphagia Clinical impression: Pt. exhibits a moderate pharyngeal and  cervical esophageal dysphagia, characterized by decreased  laryngeal elevation and hyoid excursion during the swallow,  resulting in moderate residue with most consistencies on the UES  and in the pyriform sinues.  The thin and nectar thick liquids  spill into the airway after the swallow before the pt. was able  to dry swallow in effort to clear the residue.  Pt. had good  sensation, and consistently coughed with penetration/aspiration.    With multiple dry swallows, pt. was able to clear purees, soft  solids, and honey thick liquids without penetration/aspiration.   Pt. will need increased time for meals.  Recommend Doppoff  feeding tube remain in place and  calorie count be completed prior  to removing tube to ensure adequate intake.  Hopefully, pt. can  maintain adequate po's to avoid need for PEG.  Will need  continued SLP for gradual advancment of diet as swallow function  returns.     Treatment Recommendation  Therapy as outlined in treatment plan below    Diet Recommendation Dysphagia 2 (Fine chop);Honey-thick liquid   Liquid Administration via: Cup;No straw Medication Administration: Crushed with puree Compensations: Slow rate;Small sips/bites;Multiple dry swallows  after each bite/sip;Follow solids with liquid;Effortful  swallow;Clear throat intermittently (Cough as needed to clear  penetrates) Postural Changes and/or Swallow Maneuvers: Out of bed  for  meals;Seated upright 90 degrees;Upright 30-60 min after meal    Other  Recommendations Recommended Consults:  (Nutrition-Calorie  Count) Oral Care Recommendations: Oral care BID Other Recommendations: Order thickener from pharmacy;Prohibited  food (jello, ice cream, thin soups);Have oral suction  available;Clarify dietary restrictions   Follow Up Recommendations  24 hour supervision/assistance    Frequency and Duration min 3x week  2 weeks   Pertinent Vitals/Pain n/a    SLP Swallow Goals  See care plan   General HPI: 79 year old male adm. with difficulty swallowing s/p  botox injections.  Patient has been treated for neck spasms with  botox neck injections. He have had 2 uneventful injections in the  past. On 10/08 he had botox injections to his neck. 2 days later  he started to have progressive swallowing difficulties until  eventually over the period of days he have had trouble with  swallowing fluids. He is able to eat apple souse. He states after  eating he starts to cough things back up. He have had a  Esophagram (regular barium swallow) done today that showed  aspiration. PMH:  Parkinson's Disease, has a past medical history  of Hypertension; Hyperlipidemia; Aneurysm of infrarenal abdominal  aorta  (SUPRAILIAC  4CM PER CT 10-20-2010--  STABLE); Bladder  cancer (RECURRENT  (FIRST DX  2001));  Frequency of urination;  Nocturia; and Orthostatic hypotension.   Type of Study: Modified Barium Swallowing Study Reason for Referral: Objectively evaluate swallowing function Previous Swallow Assessment: BSE 10/21; Esophagram 10/20. Diet Prior to this Study: NPO Temperature Spikes Noted: No Respiratory Status: Nasal cannula History of Recent Intubation: No Behavior/Cognition: Alert;Cooperative;Pleasant mood Oral Cavity - Dentition: Adequate natural dentition Oral Motor / Sensory Function: Within functional limits Self-Feeding Abilities: Able to feed self Patient Positioning: Upright in chair Baseline Vocal Quality: Clear Volitional Cough: Weak Volitional Swallow: Able to elicit Anatomy: Within functional limits Pharyngeal Secretions: Standing secretions in (comment)  (pyriforms (noted after swallowing barium))    Reason for Referral Objectively evaluate swallowing function   Oral Phase Oral Preparation/Oral Phase Oral Phase: WFL   Pharyngeal Phase Pharyngeal Phase Pharyngeal Phase: Impaired Pharyngeal - Honey Pharyngeal - Honey Teaspoon: Reduced anterior laryngeal  mobility;Reduced laryngeal elevation;Pharyngeal residue -  pyriform sinuses;Pharyngeal residue - cp segment;Compensatory  strategies attempted (Comment);Inter-arytenoid space residue  (Multiple dry swallows clears most residue.) Pharyngeal - Honey Cup: Reduced anterior laryngeal  mobility;Reduced laryngeal elevation;Penetration/Aspiration after  swallow;Pharyngeal residue - pyriform sinuses;Pharyngeal residue  - cp segment;Compensatory strategies attempted  (Comment);Inter-arytenoid space residue Pharyngeal - Nectar Pharyngeal - Nectar Teaspoon: Reduced anterior laryngeal  mobility;Reduced laryngeal elevation;Penetration/Aspiration after  swallow;Pharyngeal residue - pyriform sinuses;Pharyngeal residue  - cp segment;Compensatory strategies attempted   (Comment);Inter-arytenoid space residue Pharyngeal - Nectar Cup: Reduced anterior laryngeal  mobility;Reduced laryngeal elevation;Penetration/Aspiration after  swallow;Pharyngeal residue - pyriform sinuses;Pharyngeal residue  - cp segment;Compensatory strategies attempted  (Comment);Inter-arytenoid space residue;Penetration/Aspiration  during swallow Penetration/Aspiration details (nectar cup): Material enters  airway, remains ABOVE vocal cords then ejected out Pharyngeal - Solids Pharyngeal - Puree: Reduced anterior laryngeal mobility;Reduced  laryngeal elevation;Pharyngeal residue - pyriform  sinuses;Pharyngeal residue - cp segment Pharyngeal - Mechanical Soft: Reduced anterior laryngeal  mobility;Reduced laryngeal elevation  Cervical Esophageal Phase    GO    Cervical Esophageal Phase Cervical Esophageal Phase: Impaired Cervical Esophageal Phase - Honey Honey Cup: Reduced cricopharyngeal relaxation;Prominent  cricopharyngeal segment Cervical Esophageal Phase - Nectar Nectar Cup: Reduced cricopharyngeal relaxation;Prominent  cricopharyngeal segment Cervical Esophageal Phase - Thin Thin Teaspoon: Reduced cricopharyngeal relaxation;Prominent  cricopharyngeal  segment Cervical Esophageal Phase - Solids Puree: Reduced cricopharyngeal relaxation;Prominent  cricopharyngeal segment Cervical Esophageal Phase - Comment Cervical Esophageal Comment: Reduced laryngeal elevation and  hyoid excursion results in decreased UES relaxation, with  moderate residue with most consistencies on the UES and in the  pyriform sinuses.         Maryjo Rochester T 03/02/2013, 11:41 AM     Scheduled Meds: . amLODipine  5 mg Per Tube Daily  . aspirin  81 mg Per Tube Daily  . atorvastatin  20 mg Per Tube Daily  . carbidopa-levodopa  1 tablet Per Tube TID  . carbidopa-levodopa  1 tablet Per Tube BID  . enoxaparin (LOVENOX) injection  40 mg Subcutaneous Q24H  . irbesartan  150 mg Per Tube BID  . pantoprazole (PROTONIX) IV  40 mg Intravenous  QHS  . selegiline  5 mg Per Tube BID WC   Continuous Infusions: . sodium chloride 100 mL/hr at 03/01/13 1528  . sodium chloride 75 mL/hr at 03/02/13 0525  . feeding supplement (JEVITY 1.2 CAL) 1,000 mL (03/01/13 1422)    Active Problems:   Unspecified essential hypertension   Parkinsonism   Dysphasia    Time spent: 35 min   WOODS, CURTIS, J  [facility] Pager [phone]. If 7PM-7AM, please contact night-coverage at [email or web address], password [facility] 03/02/2013, 1:59 PM  LOS: 2 days

## 2013-03-02 NOTE — Progress Notes (Signed)
Writer spoke to Dr. Lucretia Roers regarding patient's diet order. Ok to stop Jevity and continue with IVF and diet order. Jevity stopped. Patient able to tolerate with current diet order. No signs of aspiration noted.   Arthor Captain, Rn 03/02/13

## 2013-03-02 NOTE — Telephone Encounter (Signed)
Left message that Dr. Terrace Arabia spoke with Dr. Duane Lope who ordered the swallowing test.  She is to check with that office if she hasn't heard anything.

## 2013-03-03 DIAGNOSIS — T17908A Unspecified foreign body in respiratory tract, part unspecified causing other injury, initial encounter: Secondary | ICD-10-CM

## 2013-03-03 LAB — GLUCOSE, CAPILLARY
Glucose-Capillary: 95 mg/dL (ref 70–99)
Glucose-Capillary: 108 mg/dL — ABNORMAL HIGH (ref 70–99)
Glucose-Capillary: 102 mg/dL — ABNORMAL HIGH (ref 70–99)

## 2013-03-03 MED ORDER — LABETALOL HCL 100 MG PO TABS: 100.0000 mg | ORAL_TABLET | Freq: Two times a day (BID) | ORAL | Status: DC

## 2013-03-03 MED ORDER — LABETALOL HCL 100 MG PO TABS
100.0000 mg | ORAL_TABLET | Freq: Two times a day (BID) | ORAL | Status: DC
  Administered 2013-03-03: 100 mg via ORAL
  Filled 2013-03-03 (×2): qty 1

## 2013-03-03 MED ORDER — AMLODIPINE BESYLATE 10 MG PO TABS: 10.0000 mg | ORAL_TABLET | Freq: Every day | ORAL | Status: DC

## 2013-03-03 MED ORDER — PANTOPRAZOLE SODIUM 40 MG PO PACK
40.0000 mg | PACK | Freq: Every day | ORAL | Status: DC
  Administered 2013-03-03: 40 mg
  Filled 2013-03-03: qty 20

## 2013-03-03 MED ORDER — ENSURE PUDDING PO PUDG
1.0000 | Freq: Three times a day (TID) | ORAL | Status: DC
  Administered 2013-03-03: 1 via ORAL

## 2013-03-03 NOTE — Evaluation (Signed)
Physical Therapy Evaluation Patient Details Name: Ivan Clark MRN: [id number] DOB: 27-Oct-1928 Today's Date: 03/03/2013 Time: 5621-3086 PT Time Calculation (min): 21 min  PT Assessment / Plan / Recommendation History of Present Illness  pt presents with Parkinson's Disease.    Clinical Impression  Pt with mild decreased balance due to decreased activity on acute.  Spoke with Nsg Tech about ambulating with pt again today and encouraged family to ask Nsg to ambulate.  Will continue to follow.      PT Assessment  Patient needs continued PT services    Follow Up Recommendations  No PT follow up;Supervision - Intermittent    Does the patient have the potential to tolerate intense rehabilitation      Barriers to Discharge        Equipment Recommendations  None recommended by PT    Recommendations for Other Services     Frequency Min 3X/week    Precautions / Restrictions Precautions Precautions: Fall Restrictions Weight Bearing Restrictions: No   Pertinent Vitals/Pain Denied pain.        Mobility  Bed Mobility Bed Mobility: Not assessed Transfers Transfers: Sit to Stand;Stand to Sit Sit to Stand: 5: Supervision;With upper extremity assist;From chair/3-in-1 Stand to Sit: 5: Supervision;With upper extremity assist;To chair/3-in-1 Ambulation/Gait Ambulation/Gait Assistance: 4: Min guard;5: Supervision Ambulation Distance (Feet): 500 Feet Assistive device: None Ambulation/Gait Assistance Details: pt initially walking in socks and having mild decreased balance, however once pt put his shoes on he was more stable.  pt states he always wears shoes at home.   Gait Pattern: Step-through pattern;Decreased stride length;Narrow base of support;Trunk flexed Stairs: No Wheelchair Mobility Wheelchair Mobility: No    Exercises     PT Diagnosis: Difficulty walking  PT Problem List: Decreased activity tolerance;Decreased balance;Decreased mobility PT Treatment Interventions:  DME instruction;Gait training;Stair training;Functional mobility training;Therapeutic activities;Therapeutic exercise;Balance training;Neuromuscular re-education;Patient/family education     PT Goals(Current goals can be found in the care plan section) Acute Rehab PT Goals Patient Stated Goal: Per wife for pt to regain his strength.   PT Goal Formulation: With patient Time For Goal Achievement: 03/17/13 Potential to Achieve Goals: Good  Visit Information  Last PT Received On: 03/03/13 Assistance Needed: +1 History of Present Illness: pt presents with Parkinson's Disease.         Prior Functioning  Home Living Family/patient expects to be discharged to:: Private residence Living Arrangements: Spouse/significant other Available Help at Discharge: Family;Available 24 hours/day Type of Home: House Home Access: Stairs to enter [employer] of Steps: 2 Entrance Stairs-Rails: Right Home Layout: One level Home Equipment: None Prior Function Level of Independence: Needs assistance ADL's / Homemaking Assistance Needed: wife helps tie shoes and performs all homemaking tasks.   Communication Communication: No difficulties    Cognition  Cognition Arousal/Alertness: Awake/alert Behavior During Therapy: WFL for tasks assessed/performed Overall Cognitive Status: Within Functional Limits for tasks assessed    Extremity/Trunk Assessment Upper Extremity Assessment Upper Extremity Assessment: Generalized weakness Lower Extremity Assessment Lower Extremity Assessment: Generalized weakness Cervical / Trunk Assessment Cervical / Trunk Assessment: Kyphotic   Balance Balance Balance Assessed: No  End of Session PT - End of Session Equipment Utilized During Treatment: Gait belt Activity Tolerance: Patient tolerated treatment well Patient left: in chair;with call bell/phone within reach;with family/visitor present Nurse Communication: Mobility status  GP     Sunny Schlein,  [place] [phone] 03/03/2013, 11:51 AM

## 2013-03-03 NOTE — Progress Notes (Signed)
Speech Language Pathology Treatment: Dysphagia  Patient Details Name: Ivan Clark MRN: [id number] DOB: 1929-01-04 Today's Date: 03/03/2013 Time: 4098-1191 SLP Time Calculation (min): 28 min  Assessment / Plan / Recommendation Clinical Impression  SLP educated wife and pt on thickening liquids and preparing appealing Dys 2 textures at home. Wife repots that meals given here were often to dry and hard for pt which may be cause for minimal PO intake. Hopeful that wife will provide more palatable meals for pt. SLP observed with honey and nectar thick liquids without any evidence of aspiration though min verbal cues were needed for strategies. Pt will need f/u with SLP at outpatient for f/u education and readiness for MBS. Pt will likely benefit from repeat MBS in 2-4 weeks for possible diet upgrade. Provided instructions to wife.    HPI HPI: 79 year old male adm. with difficulty swallowing s/p botox injections.  Patient has been treated for neck spasms with botox neck injections. He have had 2 uneventful injections in the past. On 10/08 he had botox injections to his neck. 2 days later he started to have progressive swallowing difficulties until eventually over the period of days he have had trouble with swallowing fluids. He is able to eat apple souse. He states after eating he starts to cough things back up. He have had a Esophagram (regular barium swallow) done today that showed aspiration. PMH:  Parkinson's Disease, has a past medical history of Hypertension; Hyperlipidemia; Aneurysm of infrarenal abdominal aorta (SUPRAILIAC  4CM PER CT 10-20-2010--  STABLE); Bladder cancer (RECURRENT  (FIRST DX  2001));  Frequency of urination; Nocturia; and Orthostatic hypotension.     Pertinent Vitals NA  SLP Plan  Continue with current plan of care    Recommendations Diet recommendations: Dysphagia 2 (fine chop);Honey-thick liquid Liquids provided via: Cup;No straw Medication Administration: Crushed  with puree Supervision: Full supervision/cueing for compensatory strategies Compensations: Slow rate;Small sips/bites;Multiple dry swallows after each bite/sip;Follow solids with liquid;Effortful swallow;Clear throat intermittently Postural Changes and/or Swallow Maneuvers: Out of bed for meals;Seated upright 90 degrees;Upright 30-60 min after meal              Oral Care Recommendations: Oral care BID Follow up Recommendations: 24 hour supervision/assistance;Outpatient SLP Plan: Continue with current plan of care    GO    [facility], [place] CCC-SLP [phone]  Claudine Mouton 03/03/2013, 4:11 PM

## 2013-03-03 NOTE — Progress Notes (Signed)
Patient is discharged from room 4N21 at this time. Alert and in stable condition. IV site d/c'd as well as NG tube from rt nare. Instructions read to patient and family and understanding verbalized. Left unit via wheelchair.

## 2013-03-03 NOTE — Progress Notes (Signed)
OT Cancellation Note/ sign off  Patient Details Name: Ivan Clark MRN: [id number] DOB: 1929-04-25   Cancelled Treatment:    Reason Eval/Treat Not Completed: OT screened, no needs identified, will sign off. Pt and wife present and report pt is at or near baseline. OT educated on reason for referral and observed pt completing toilet transfer on arrival. With no needs present, OT signing off  Harolyn Rutherford Pager: [phone]  03/03/2013, 3:46 PM

## 2013-03-03 NOTE — Progress Notes (Signed)
SLP Cancellation Note  Patient Details Name: Ivan Clark MRN: [id number] DOB: 03-Jul-1928   Cancelled treatment:       Reason Eval/Treat Not Completed: Other (comment). Wife not present. SLP will f/u around 4pm with pt and wife for education.    Copelyn Widmer, Riley Nearing 03/03/2013, 2:30 PM

## 2013-03-03 NOTE — Discharge Summary (Signed)
Physician Discharge Summary  Ivan Clark JWJ:[id number] DOB: 1929-03-16 DOA: 02/28/2013  PCP:  Duane Lope, MD  Admit date: 02/28/2013 Discharge date: 03/03/2013  Time spent: 45 minutes  Recommendations for Outpatient Follow-up:  1. Parkinson's disease; continue medication per neurology;  a) Continue Sinemet 25/100 PO  0800/1230/1800  b) Restart Sinemet 50/200 ER nightly  3) Continue Selegiline PO   -PT/OT consulted; normal requirements identified.  -Speech therapy will be required as patient -Follow up in 3 weeks for MBS for reevaluation of swallowing function  2. Dysphagia pharyngeal; continue on dysphagia 2 (fine Chop) honey thick liquid diet -  3. HTN; increase amlodipine to 10 mg daily  -Continued labetalol 100 mg twice a day    Discharge Diagnoses:  Active Problems:   Unspecified essential hypertension   Parkinsonism   Dysphagia, pharyngeal   Discharge Condition: Stable  Diet recommendation: dysphagia 2 (fine Chop) honey thick liquid diet  Filed Weights   02/28/13 2011 03/02/13 0500 03/03/13 0500  Weight: 78.926 kg (174 lb) 78.971 kg (174 lb 1.6 oz) 78.2 kg (172 lb 6.4 oz)    History of present illness:  Ivan Clark 79 y.o.WM PMHx Hypertension; Hyperlipidemia; Aneurysm of infrarenal abdominal aorta (SUPRAILIAC 4CM PER CT 10-20-2010-- STABLE); Bladder cancer (RECURRENT (FIRST DX 2001)); Parkinson disease; Frequency of urination; Nocturia; and Orthostatic hypotension. On 02/28/2013 patient presented to ED following Botox injections to his neck for Anterocollis. He had 2 uneventful injections in the past. On 10/08 he had botox injections to his neck. 2 days later he started to have progressive swallowing difficulties until eventually over the period of days he have had trouble with swallowing fluids. He is able to eat apple souse. He states after eating he starts to cough things back up. 03/02/2013 patient states was able to eat as much following his MBS.  States able to ambulate on his own, lives at home with wife both retired. States able to perform all ADLs. TODAY and states has been ambulating around the ward without difficulty and is ready for discharge  Consultants:  Neurology, nutrition   Procedures:  Modified barium swallow 03/02/2013  Recommendations are as follows  -Dysphagia 2 (Fine chop);Honey-thick liquid  Liquid Administration via: Cup;No straw  Medication Administration: Crushed with puree  Compensations: Slow rate;Small sips/bites;Multiple dry swallows after each bite/sip;Follow solids with liquid;Effortful swallow;Clear throat intermittently (Cough as needed to clear penetrates)  Postural Changes and/or Swallow Maneuvers: Out of bed for meals;Seated upright 90 degrees;Upright 30-60 min after meal     Discharge Exam: Filed Vitals:   03/03/13 0620 03/03/13 1000 03/03/13 1443 03/03/13 1700  BP: 152/70 171/67 178/62 167/68  Pulse:  89 88 87  Temp:  97.5 F (36.4 C) 97.5 F (36.4 C)   TempSrc:  Oral Oral   Resp:  18 18   Height:      Weight:      SpO2:  97% 98%    General: A./O. x4, NAD  Cardiovascular: Regular rhythm and rate, negative murmurs rubs gallops, DP/PT pulse 2+ bilateral  Respiratory: Clear to auscultation bilateral  Abdomen: Soft, nontender, nondistended plus bowel sounds  Musculoskeletal: Negative pedal edema, NG tube present    Discharge Instructions   Future Appointments Provider Department Dept Phone   03/07/2013 11:00 AM Mc-Dg R/F [facility] DIAGNOSTIC RADIOLOGY [phone]   05/19/2013 1:15 PM Amy Aleatha Borer, PT [facility] [phone]       Medication List    ASK your doctor about these medications  amLODipine 5 MG tablet  Commonly known as:  NORVASC  Take 5 mg by mouth daily.     aspirin EC 81 MG tablet  Take 81 mg by mouth daily after supper.     atorvastatin 20 MG tablet  Commonly known as:  LIPITOR  Take 20  mg by mouth daily.     b complex vitamins tablet  Take 1 tablet by mouth daily.     carbidopa-levodopa 25-100 MG per tablet  Commonly known as:  SINEMET IR  Take 1 tablet by mouth 3 (three) times daily with meals.     carbidopa-levodopa 50-200 MG per tablet  Commonly known as:  SINEMET CR  Take 1 tablet by mouth at bedtime.     dutasteride 0.5 MG capsule  Commonly known as:  AVODART  Take 0.5 mg by mouth daily after supper.     irbesartan 150 MG tablet  Commonly known as:  AVAPRO  Take 150 mg by mouth 2 (two) times daily.     multivitamin with minerals Tabs tablet  Take 1 tablet by mouth daily.     selegiline 5 MG capsule  Commonly known as:  ELDEPRYL  Take 5 mg by mouth 2 (two) times daily. One in the morning and one at noon.     tamsulosin 0.4 MG Caps capsule  Commonly known as:  FLOMAX  Take 0.4 mg by mouth daily after supper.     Vitamin D 2000 UNITS Caps  Take 2,000 Units by mouth daily.       No Known Allergies    The results of significant diagnostics from this hospitalization (including imaging, microbiology, ancillary and laboratory) are listed below for reference.    Significant Diagnostic Studies: Dg Chest 2 View  03/01/2013   CLINICAL DATA:  Difficulty swallowing. Unable to swallow after both months injection. Aspiration on barium swallow.  EXAM: CHEST  2 VIEW  COMPARISON:  07/14/2012  FINDINGS: Mild emphysematous changes in the lungs. Scattered fibrosis. Normal heart size and pulmonary vascularity. No focal airspace disease or consolidation in the lungs. No blunting of costophrenic angles. No pneumothorax. Mediastinal contours appear intact. Residual contrast material in the stomach. Old right rib fractures. Calcification of the aorta.  IMPRESSION: No active cardiopulmonary disease.   Electronically Signed   By: Burman Nieves M.D.   On: 03/01/2013 00:41   Dg Esophagus  02/28/2013   CLINICAL DATA:  Parkinson's. Recent neck but tox injection. Now with  difficulty swallowing. Rule out aspiration  EXAM: ESOPHOGRAM/BARIUM SWALLOW  TECHNIQUE: Single contrast examination was performed using  thin barium.  COMPARISON:  None.  FLUOROSCOPY TIME:  0 Min and 56 seconds  FINDINGS: Patient has marked thoracic kyphosis.  Initially there was penetration. Subsequently there was mild aspiration. There is no coughing.  Negative for esophageal stricture or mass. GE junction normal without hiatal hernia. I do not test the patient for reflux.  IMPRESSION: Mild aspiration is present. Negative for cough reflux.   Electronically Signed   By: Marlan Palau M.D.   On: 02/28/2013 23:16   Dg Abd Portable 1v  03/01/2013   CLINICAL DATA:  Handle placement.  EXAM: PORTABLE ABDOMEN - 1 VIEW  COMPARISON:  None.  FINDINGS: Enteric tube tip is in the left upper quadrant consistent with location in the upper stomach. Residual contrast material in the small bowel and colon. No small or large bowel distention.  IMPRESSION: Enteric tube tip is in the left upper quadrant consistent with location in the upper stomach.  Electronically Signed   By: Burman Nieves M.D.   On: 03/01/2013 03:59   Dg Swallowing Func-speech Pathology  03/02/2013   Lenor Derrick, CCC-SLP     03/02/2013 11:42 AM Objective Swallowing Evaluation: Modified Barium Swallowing Study   Patient Details  Name: Ivan Clark MRN: [id number] Date of Birth: 08-05-1928  Today's Date: 03/02/2013 Time: 1000-1035 SLP Time Calculation (min): 35 min  Past Medical History:  Past Medical History  Diagnosis Date  . Hypertension   . Hyperlipidemia   . Aneurysm of infrarenal abdominal aorta SUPRAILIAC  4CM PER CT  10-20-2010--  STABLE  . Bladder cancer RECURRENT  (FIRST DX  2001)    HX TURBT AND CHEMO BLADDER INSTILLATION TX'S  . Parkinson disease   . Frequency of urination   . Nocturia   . Orthostatic hypotension    Past Surgical History:  Past Surgical History  Procedure Laterality Date  . Tonsillectomy and adenoidectomy  1938  . Left  inguinal hernia repair  1996  . Transurethral resection of bladder tumor  09-11-1999    BLADDER CANCER  . Cysto/ retrograde pyelogram/ bladder bx's  07-03-2004  . Cysto/ bladder bx/ fulgeration bladder tumor  10-09-2004  . Appendectomy  10-20-2010  . Cystoscopy with biopsy  11/28/2011    Procedure: CYSTOSCOPY WITH BIOPSY;  Surgeon: Antony Haste, MD;  Location: [facility];  Service:  Urology;  Laterality: N/A;  . Vasectomy     HPI:  79 year old male adm. with difficulty swallowing s/p botox  injections.  Patient has been treated for neck spasms with botox  neck injections. He have had 2 uneventful injections in the past.  On 10/08 he had botox injections to his neck. 2 days later he  started to have progressive swallowing difficulties until  eventually over the period of days he have had trouble with  swallowing fluids. He is able to eat apple souse. He states after  eating he starts to cough things back up. He have had a  Esophagram (regular barium swallow) done today that showed  aspiration. PMH:  Parkinson's Disease, has a past medical history  of Hypertension; Hyperlipidemia; Aneurysm of infrarenal abdominal  aorta (SUPRAILIAC  4CM PER CT 10-20-2010--  STABLE); Bladder  cancer (RECURRENT  (FIRST DX  2001));  Frequency of urination;  Nocturia; and Orthostatic hypotension.       Assessment / Plan / Recommendation Clinical Impression  Dysphagia Diagnosis: Moderate pharyngeal phase  dysphagia;Moderate cervical esophageal phase dysphagia Clinical impression: Pt. exhibits a moderate pharyngeal and  cervical esophageal dysphagia, characterized by decreased  laryngeal elevation and hyoid excursion during the swallow,  resulting in moderate residue with most consistencies on the UES  and in the pyriform sinues.  The thin and nectar thick liquids  spill into the airway after the swallow before the pt. was able  to dry swallow in effort to clear the residue.  Pt. had good  sensation, and  consistently coughed with penetration/aspiration.    With multiple dry swallows, pt. was able to clear purees, soft  solids, and honey thick liquids without penetration/aspiration.   Pt. will need increased time for meals.  Recommend Doppoff  feeding tube remain in place and calorie count be completed prior  to removing tube to ensure adequate intake.  Hopefully, pt. can  maintain adequate po's to avoid need for PEG.  Will need  continued SLP for gradual advancment of diet as swallow function  returns.     Treatment  Recommendation  Therapy as outlined in treatment plan below    Diet Recommendation Dysphagia 2 (Fine chop);Honey-thick liquid   Liquid Administration via: Cup;No straw Medication Administration: Crushed with puree Compensations: Slow rate;Small sips/bites;Multiple dry swallows  after each bite/sip;Follow solids with liquid;Effortful  swallow;Clear throat intermittently (Cough as needed to clear  penetrates) Postural Changes and/or Swallow Maneuvers: Out of bed for  meals;Seated upright 90 degrees;Upright 30-60 min after meal    Other  Recommendations Recommended Consults:  (Nutrition-Calorie  Count) Oral Care Recommendations: Oral care BID Other Recommendations: Order thickener from pharmacy;Prohibited  food (jello, ice cream, thin soups);Have oral suction  available;Clarify dietary restrictions   Follow Up Recommendations  24 hour supervision/assistance    Frequency and Duration min 3x week  2 weeks   Pertinent Vitals/Pain n/a    SLP Swallow Goals  See care plan   General HPI: 79 year old male adm. with difficulty swallowing s/p  botox injections.  Patient has been treated for neck spasms with  botox neck injections. He have had 2 uneventful injections in the  past. On 10/08 he had botox injections to his neck. 2 days later  he started to have progressive swallowing difficulties until  eventually over the period of days he have had trouble with  swallowing fluids. He is able to eat apple souse. He  states after  eating he starts to cough things back up. He have had a  Esophagram (regular barium swallow) done today that showed  aspiration. PMH:  Parkinson's Disease, has a past medical history  of Hypertension; Hyperlipidemia; Aneurysm of infrarenal abdominal  aorta (SUPRAILIAC  4CM PER CT 10-20-2010--  STABLE); Bladder  cancer (RECURRENT  (FIRST DX  2001));  Frequency of urination;  Nocturia; and Orthostatic hypotension.   Type of Study: Modified Barium Swallowing Study Reason for Referral: Objectively evaluate swallowing function Previous Swallow Assessment: BSE 10/21; Esophagram 10/20. Diet Prior to this Study: NPO Temperature Spikes Noted: No Respiratory Status: Nasal cannula History of Recent Intubation: No Behavior/Cognition: Alert;Cooperative;Pleasant mood Oral Cavity - Dentition: Adequate natural dentition Oral Motor / Sensory Function: Within functional limits Self-Feeding Abilities: Able to feed self Patient Positioning: Upright in chair Baseline Vocal Quality: Clear Volitional Cough: Weak Volitional Swallow: Able to elicit Anatomy: Within functional limits Pharyngeal Secretions: Standing secretions in (comment)  (pyriforms (noted after swallowing barium))    Reason for Referral Objectively evaluate swallowing function   Oral Phase Oral Preparation/Oral Phase Oral Phase: WFL   Pharyngeal Phase Pharyngeal Phase Pharyngeal Phase: Impaired Pharyngeal - Honey Pharyngeal - Honey Teaspoon: Reduced anterior laryngeal  mobility;Reduced laryngeal elevation;Pharyngeal residue -  pyriform sinuses;Pharyngeal residue - cp segment;Compensatory  strategies attempted (Comment);Inter-arytenoid space residue  (Multiple dry swallows clears most residue.) Pharyngeal - Honey Cup: Reduced anterior laryngeal  mobility;Reduced laryngeal elevation;Penetration/Aspiration after  swallow;Pharyngeal residue - pyriform sinuses;Pharyngeal residue  - cp segment;Compensatory strategies attempted  (Comment);Inter-arytenoid space  residue Pharyngeal - Nectar Pharyngeal - Nectar Teaspoon: Reduced anterior laryngeal  mobility;Reduced laryngeal elevation;Penetration/Aspiration after  swallow;Pharyngeal residue - pyriform sinuses;Pharyngeal residue  - cp segment;Compensatory strategies attempted  (Comment);Inter-arytenoid space residue Pharyngeal - Nectar Cup: Reduced anterior laryngeal  mobility;Reduced laryngeal elevation;Penetration/Aspiration after  swallow;Pharyngeal residue - pyriform sinuses;Pharyngeal residue  - cp segment;Compensatory strategies attempted  (Comment);Inter-arytenoid space residue;Penetration/Aspiration  during swallow Penetration/Aspiration details (nectar cup): Material enters  airway, remains ABOVE vocal cords then ejected out Pharyngeal - Solids Pharyngeal - Puree: Reduced anterior laryngeal mobility;Reduced  laryngeal elevation;Pharyngeal residue - pyriform  sinuses;Pharyngeal residue - cp segment Pharyngeal - Mechanical Soft:  Reduced anterior laryngeal  mobility;Reduced laryngeal elevation  Cervical Esophageal Phase    GO    Cervical Esophageal Phase Cervical Esophageal Phase: Impaired Cervical Esophageal Phase - Honey Honey Cup: Reduced cricopharyngeal relaxation;Prominent  cricopharyngeal segment Cervical Esophageal Phase - Nectar Nectar Cup: Reduced cricopharyngeal relaxation;Prominent  cricopharyngeal segment Cervical Esophageal Phase - Thin Thin Teaspoon: Reduced cricopharyngeal relaxation;Prominent  cricopharyngeal segment Cervical Esophageal Phase - Solids Puree: Reduced cricopharyngeal relaxation;Prominent  cricopharyngeal segment Cervical Esophageal Phase - Comment Cervical Esophageal Comment: Reduced laryngeal elevation and  hyoid excursion results in decreased UES relaxation, with  moderate residue with most consistencies on the UES and in the  pyriform sinuses.         Maryjo Rochester T 03/02/2013, 11:41 AM     Microbiology: No results found for this or any previous visit (from the past 240 hour(s)).    Labs: Basic Metabolic Panel:  Recent Labs Lab 02/28/13 2052 03/01/13 0527  NA 139 138  K 3.9 3.7  CL 103 102  CO2 25 26  GLUCOSE 103* 97  BUN 24* 21  CREATININE 1.09 1.06  CALCIUM 9.4 9.0  MG  --  1.8  PHOS  --  2.7   Liver Function Tests:  Recent Labs Lab 02/28/13 2052 03/01/13 0527  AST 22 20  ALT <5 <5  ALKPHOS 71 70  BILITOT 1.0 1.0  PROT 7.1 6.6  ALBUMIN 3.7 3.6   No results found for this basename: LIPASE, AMYLASE,  in the last 168 hours No results found for this basename: AMMONIA,  in the last 168 hours CBC:  Recent Labs Lab 02/28/13 2052 03/01/13 0527  WBC 7.8 6.7  NEUTROABS 5.0  --   HGB 13.7 13.1  HCT 38.9* 38.1*  MCV 90.3 90.9  PLT 234 225   Cardiac Enzymes: No results found for this basename: CKTOTAL, CKMB, CKMBINDEX, TROPONINI,  in the last 168 hours BNP: BNP (last 3 results) No results found for this basename: PROBNP,  in the last 8760 hours CBG:  Recent Labs Lab 03/02/13 2342 03/03/13 0421 03/03/13 0809 03/03/13 1131 03/03/13 1636  GLUCAP 108* 95 108* 102* 133*       Signed:  Carolyne Littles, MD [facility] [phone] pager

## 2013-03-03 NOTE — Progress Notes (Signed)
NUTRITION FOLLOW UP  Intervention:    Continue calorie count -- RD to follow up Friday, 10/24  Ensure Pudding 3 times daily (170 kcals, 4 gm protein per 4 oz cup)  Magic Cup supplement 3 times daily with meals (290 kcals, 9 gm protein per 4 oz cup) RD to follow for nutrition care plan  Nutrition Dx:   Inadequate oral intake related to dysphagia as evidenced by PO intake 50%, improved  New Goal:   Oral intake to meet >/= 90% of estimated nutrition needs, progressing  Monitor:   PO & supplemental intake, weight, labs, I/O's  Assessment:   Patient with PMH of HTN, HLD and Parkinson's disease presented with progressive difficulty swallowing; has been treated for neck spasms with Botox neck injections; esophagram/barium swallow exam showed aspiration.  48 hour calorie count ordered 10/22.  Patient's EN formula (Jevity 1.2) stopped, small-bore feeding tube remains in place.  MBSS 10/22 -- presented with moderate dysphagia; advanced to Dys 2-honey thick liquid diet; PO intake has been ~ 50% per flowsheet records.  Meal tickets: Breakfast 10/23: 185 kcals, 9 gm protein Lunch 10/23: 425 kcals, 4 gm protein  RD brought patient chocolate Ensure Pudding during visit.  Height: Ht Readings from Last 1 Encounters:  02/28/13 5\' 7"  (1.702 m)    Weight Status:   Wt Readings from Last 1 Encounters:  03/03/13 172 lb 6.4 oz (78.2 kg)    Re-estimated needs:  Kcal: 1900-2100 Protein: 95-105 gm Fluid: 1.9-2.1 L  Skin: Intact  Diet Order: Dysphagia 2, honey thick liquids   Intake/Output Summary (Last 24 hours) at 03/03/13 1542 Last data filed at 03/03/13 0850  Gross per 24 hour  Intake    480 ml  Output      0 ml  Net    480 ml    Labs:   Recent Labs Lab 02/28/13 2052 03/01/13 0527  NA 139 138  K 3.9 3.7  CL 103 102  CO2 25 26  BUN 24* 21  CREATININE 1.09 1.06  CALCIUM 9.4 9.0  MG  --  1.8  PHOS  --  2.7  GLUCOSE 103* 97    CBG (last 3)   Recent Labs  03/03/13 0421 03/03/13 0809 03/03/13 1131  GLUCAP 95 108* 102*    Scheduled Meds: . amLODipine  10 mg Per Tube Daily  . aspirin  81 mg Per Tube Daily  . atorvastatin  20 mg Per Tube Daily  . carbidopa-levodopa  1 tablet Per Tube TID  . carbidopa-levodopa  1 tablet Per Tube BID  . enoxaparin (LOVENOX) injection  40 mg Subcutaneous Q24H  . irbesartan  150 mg Per Tube BID  . pantoprazole sodium  40 mg Per Tube Daily  . selegiline  5 mg Per Tube BID WC    Continuous Infusions: . sodium chloride 100 mL/hr at 03/01/13 1528  . sodium chloride 75 mL/hr at 03/03/13 1154  . feeding supplement (JEVITY 1.2 CAL) 1,000 mL (03/01/13 1422)    Maureen Chatters, RD, LDN Pager #: [phone] After-Hours Pager #: [phone]

## 2013-03-04 NOTE — Progress Notes (Signed)
Received orders for home health speech therapy after the patient had discharged; telephone call to the patient, spoke to patient and spouse- he does not want home health speech therapy and plans to talk to his PCP to set up for outpatient speech therapy that he had in the past; also he plans to schedule a follow up hospital apt with his PCP - Dr Tenny Craw. Abelino Derrick RN,BSN,MHA

## 2013-03-07 ENCOUNTER — Other Ambulatory Visit (HOSPITAL_COMMUNITY): Payer: Medicare Other

## 2013-03-07 ENCOUNTER — Ambulatory Visit (HOSPITAL_COMMUNITY): Payer: Medicare Other

## 2013-03-08 ENCOUNTER — Encounter: Payer: Self-pay | Admitting: Neurology

## 2013-03-08 ENCOUNTER — Ambulatory Visit (INDEPENDENT_AMBULATORY_CARE_PROVIDER_SITE_OTHER): Payer: Medicare Other | Admitting: Neurology

## 2013-03-08 VITALS — Ht 65.0 in | Wt 168.0 lb

## 2013-03-08 DIAGNOSIS — K117 Disturbances of salivary secretion: Secondary | ICD-10-CM

## 2013-03-08 DIAGNOSIS — G243 Spasmodic torticollis: Secondary | ICD-10-CM

## 2013-03-08 DIAGNOSIS — G20 Parkinson's disease: Secondary | ICD-10-CM

## 2013-03-08 DIAGNOSIS — R1313 Dysphagia, pharyngeal phase: Secondary | ICD-10-CM

## 2013-03-08 NOTE — Telephone Encounter (Signed)
error 

## 2013-03-08 NOTE — Progress Notes (Signed)
Ivan Clark is a 79 year old Caucasian male, accompanied by his wife at today's clinical visit. His primary care physician is Dr. Duane Lope,   History: He has past medical history of bladder cancer, hypertension, prostate hypertrophy, over the past 20 years, he also developed gradual onset of anterocollois.  Since 2012, wife noticed that he has slowed down significantly, has difficulty initiate gait, some small shuffling gait, occasionally bilateral hands tremor, recent few months, also developed frequent lightheadedness, most often when getting up from sitting position, no passing out,  patient described lightheaded funny feelings throughout his body during those episode.  He also has chronic constipation, acting out of dreams, loss of sense of smell for a long time period. He contributed to his long time smoking history, he quit 11 years ago he used to smoke 2 packs a day.  MRI of brain in August 2011: mild atrophy,small vessel disease, no acute lesions. MRA of brain was normal  He did have mild parkinsonian features, and I have started him on Sinemet 25/100 3 times a day, he tolerated the medication well, there is mild improvement in his gait, he underwent emergency appendectomy in June 2013, recovered well from his surgery. He denies memory trouble.  He was started on selegiline since 04/2012, did very well, but had few episodes of dizziness,   He had one fainting spell in March 5th 2014, he got up early morning, around 5:45 AM, he was in bathroom, he felt funny after taking few steps, landed on the floor, he was taken to Dr. Tenny Craw, was found to have orthostatic hypotension, was sent to the emergency room, reported normal CT scan.  He continues to complain of lightheadedness when standing up quickly,  He began to receive EMG guided butulinum toxin injection for his anterocollis and sialorrhea since March 19th 2014, the first one was in March 2014, the second injection was in July 2nd 2014, each time,  he has mild improvement in straighting up his neck better, so he can walk better,  he denies significant side effect with each injection  The third injection was October 10th 2014 , the same amount of xeomin 100 units total, 50 units to sternocleidal mastoid muscles, 5 days after the injection, he began to notice difficulty swallowing, especially liquid, and hard object, eventually was admitted to hospital in October 20, swallowing study has demonstrated moderate pharyngeal and cervical esophageal dysphagia, characterized by decreased laryngeal elevation and hyoid excursion during the swallow, resulting in moderate residue with most consistencies on the upper esophagus and in the pyriform sinues. The thin and nectar thick liquids spill into the airway after the swallow before the patient was able to dry swallow in effort to clear the residue. Patient had good sensation, and consistently coughed with penetration/aspiration. With multiple dry swallows. The patient was able to clear purees, soft solids, and honey thick liquids without penetration/aspiration. He require NG tube for one day, now he is discharged to home.  His wife is taking good care of him, He can eat oatmeal, tomato soup, eggs, half banana, lemon pie,pear cups, apple sauce, cream potato, carrot, scrumbled eggs, banana yogurt,  [place] dressing. But he could not eat regular food.  He does not like the food thickening in his drink.  He occasionally has coughing, he can get rid off it quickly.  He denies significant pain, he was able to raise up his neck better than any of the previous injection.  Physical Exam    Neurologic Exam  Mental Status:  awake, alert, cooperative to history, talking, and casual conversation, moderate to severe anterocollis, moderate, left turn, mild right tilt Cranial Nerves: CN II-XII pupils were equal round reactive to light.  Extraocular movements were full.  Visual fields were full on confrontational test.   Facial sensation and strength were normal.  Hearing was intact to finger rubbing bilaterally.  Uvula tongue were midline.  Head turning and shoulder shrugging were normal and symmetric.  Tongue protrusion into the cheeks strength were normal.  Motor:  mild to moderate, right more left limb, and nuchal moderate rigidity. no weakness, no sigificant tremor noticed. early fatiguability on rapid wrist opening and closure, finger tapping.  He was able to raise his neck up to 100 degree. Sensory:  length-dependent decreased  light touch, pinprick,  and decreased vibratory sensation at toes. Coordination: Normal finger-to-nose, heel-to-shin.  There was no dysmetria noticed. Gait and Station: Can arise from chair without use of chairarm,  Narrow based gait, he has decreased  arm swing, no  shuffling gait, smooth turning.  Reflexes: Deep tendon reflexes: Biceps: 2/2, Brachioradialis: 2/2, Triceps: 2/2, Pateller: 2/2, Achilles: 1/1.  Plantar responses are flexor.  Assessment and plan 79 year old gentleman presenting with idiopathic parkinsonism, he had developed dysphagia after Xeomin injection in 10/8th, he has no breathing difficulties, he still had peek of the Botulinum toxin side effect. It should wearing off after 8-10 weeks post injection. Hope that his dysphagia will improve significantly.  He will return to clinic in one month

## 2013-03-17 ENCOUNTER — Other Ambulatory Visit: Payer: Self-pay

## 2013-03-25 ENCOUNTER — Ambulatory Visit (HOSPITAL_COMMUNITY)
Admission: RE | Admit: 2013-03-25 | Discharge: 2013-03-25 | Disposition: A | Discharge status: Home / Self Care | Payer: Medicare Other | Source: Ambulatory Visit | Attending: Family Medicine | Admitting: Family Medicine

## 2013-03-25 DIAGNOSIS — R1310 Dysphagia, unspecified: Secondary | ICD-10-CM | POA: Insufficient documentation

## 2013-03-25 DIAGNOSIS — G20 Parkinson's disease: Secondary | ICD-10-CM | POA: Insufficient documentation

## 2013-03-25 DIAGNOSIS — R1313 Dysphagia, pharyngeal phase: Secondary | ICD-10-CM | POA: Insufficient documentation

## 2013-03-25 DIAGNOSIS — G20A1 Parkinson's disease without dyskinesia, without mention of fluctuations: Secondary | ICD-10-CM | POA: Insufficient documentation

## 2013-03-25 NOTE — Procedures (Signed)
Objective Swallowing Evaluation: Modified Barium Swallowing Study  Patient Details  Name: Ivan Clark MRN: [id number] Date of Birth: Feb 20, 1929  Today's Date: 03/25/2013 Time: 1140-1205 SLP Time Calculation (min): 25 min  Past Medical History:  Past Medical History  Diagnosis Date  . Hypertension   . Hyperlipidemia   . Aneurysm of infrarenal abdominal aorta SUPRAILIAC  4CM PER CT 10-20-2010--  STABLE  . Bladder cancer RECURRENT  (FIRST DX  2001)    HX TURBT AND CHEMO BLADDER INSTILLATION TX'S  . Parkinson disease   . Frequency of urination   . Nocturia   . Orthostatic hypotension    Past Surgical History:  Past Surgical History  Procedure Laterality Date  . Tonsillectomy and adenoidectomy  1938  . Left inguinal hernia repair  1996  . Transurethral resection of bladder tumor  09-11-1999    BLADDER CANCER  . Cysto/ retrograde pyelogram/ bladder bx's  07-03-2004  . Cysto/ bladder bx/ fulgeration bladder tumor  10-09-2004  . Appendectomy  10-20-2010  . Cystoscopy with biopsy  11/28/2011    Procedure: CYSTOSCOPY WITH BIOPSY;  Surgeon: Antony Haste, MD;  Location: [facility];  Service: Urology;  Laterality: N/A;  . Vasectomy     HPI:  Pt is an 73 year olf male arriving for a follow up MBS after an admission in 10/21. Pt has dx of Parkinsons, underwent botox injections for neck spasms resulting in dysphagia. Pt had MBS 10/22 recommended puree and honey thick liquids due to severe residuals and aspiration of residuals. Since d/c pt has been drinking nectar thick liquids and some thin liquids and soft solids. He feels that he has improved significantly. He has not had any f/u with SLP.      Assessment / Plan / Recommendation Clinical Impression  Dysphagia Diagnosis: Mild pharyngeal phase dysphagia Clinical impression: Pt demonstrates improvement in quantity of residuals present post swallow. Forward head posture and weakness of hyolaryngeal mechanism  still result in moderate pharyngeal residuals, though they are no longer significant enough to penetrate airway post swallow. With min verbal cues, pt was able to successfully follow each moderately sized sip with an immediate second swallow to clear residue. An occasional throat clear is also suggested to further protect airway. Recommend pt consume moist solids (mechanical soft texture) with thin liquids with careful adherance to strategies. Pt should continue to take pills in puree. No SLP f/u needed unless there is a significant change in function/medical status. Discussed this with wife.     Treatment Recommendation  No treatment recommended at this time    Diet Recommendation Dysphagia 3 (Mechanical Soft);Thin liquid   Liquid Administration via: Cup Medication Administration: Whole meds with puree Supervision: Patient able to self feed;Intermittent supervision to cue for compensatory strategies Compensations: Slow rate;Small sips/bites;Multiple dry swallows after each bite/sip;Clear throat intermittently Postural Changes and/or Swallow Maneuvers: Seated upright 90 degrees    Other  Recommendations     Follow Up Recommendations  None    Frequency and Duration        Pertinent Vitals/Pain NA    SLP Swallow Goals     General HPI: Pt is an 68 year olf male arriving for a follow up MBS after an admission in 10/21. Pt has dx of Parkinsons, underwent botox injections for neck spasms resulting in dysphagia. Pt had MBS 10/22 recommended puree and honey thick liquids due to severe residuals and sensed aspiration of residuals. Since d/c pt has been drinking nectar thick liquids and some thin  liquids and soft solids. He feels that he has improved significantly. He has not had any f/u with SLP.  Type of Study: Modified Barium Swallowing Study Reason for Referral: Objectively evaluate swallowing function Diet Prior to this Study: Thin liquids;Dysphagia 3 (soft) Temperature Spikes Noted:  N/A Respiratory Status: Room air History of Recent Intubation: No Behavior/Cognition: Alert;Cooperative;Pleasant mood Oral Cavity - Dentition: Missing dentition;Adequate natural dentition (missing back dentition) Oral Motor / Sensory Function: Within functional limits Self-Feeding Abilities: Able to feed self Patient Positioning: Upright in chair Baseline Vocal Quality: Clear Volitional Cough: Strong Volitional Swallow: Able to elicit Anatomy:  (forward head posture) Pharyngeal Secretions: Not observed secondary MBS    Reason for Referral Objectively evaluate swallowing function   Oral Phase Oral Preparation/Oral Phase Oral Phase: Impaired Oral Phase - Comment Oral Phase - Comment: mild oral residuals, cleared with second swallow   Pharyngeal Phase Pharyngeal Phase Pharyngeal Phase: Impaired Pharyngeal - Thin Pharyngeal - Thin Cup: Reduced pharyngeal peristalsis;Reduced anterior laryngeal mobility;Reduced laryngeal elevation;Reduced tongue base retraction;Penetration/Aspiration after swallow;Pharyngeal residue - valleculae;Pharyngeal residue - pyriform sinuses Penetration/Aspiration details (thin cup): Material enters airway, remains ABOVE vocal cords and not ejected out;Material does not enter airway Pharyngeal - Solids Pharyngeal - Puree: Reduced pharyngeal peristalsis;Reduced anterior laryngeal mobility;Reduced laryngeal elevation;Reduced tongue base retraction;Pharyngeal residue - valleculae;Pharyngeal residue - pyriform sinuses Penetration/Aspiration details (puree): Material does not enter airway Pharyngeal - Regular: Reduced pharyngeal peristalsis;Reduced anterior laryngeal mobility;Reduced laryngeal elevation;Reduced tongue base retraction;Pharyngeal residue - valleculae;Pharyngeal residue - pyriform sinuses Penetration/Aspiration details (regular): Material does not enter airway Pharyngeal - Pill: Reduced pharyngeal peristalsis;Reduced anterior laryngeal mobility;Reduced  laryngeal elevation;Reduced tongue base retraction;Pharyngeal residue - valleculae;Pharyngeal residue - pyriform sinuses;Penetration/Aspiration during swallow;Trace aspiration  Cervical Esophageal Phase    GO    Cervical Esophageal Phase Cervical Esophageal Phase: [facility]    Functional Assessment Tool Used: clinical judgement Functional Limitations: Swallowing Swallow Current Status (Z6109): At least 1 percent but less than 20 percent impaired, limited or restricted Swallow Goal Status [phone]): At least 1 percent but less than 20 percent impaired, limited or restricted Swallow Discharge Status [phone]): At least 1 percent but less than 20 percent impaired, limited or restricted   [facility], MA CCC-SLP [phone]  Claudine Mouton 03/25/2013, 1:42 PM

## 2013-04-09 ENCOUNTER — Emergency Department (HOSPITAL_COMMUNITY): Payer: Medicare Other

## 2013-04-09 ENCOUNTER — Inpatient Hospital Stay (HOSPITAL_COMMUNITY): Payer: Medicare Other

## 2013-04-09 ENCOUNTER — Encounter (HOSPITAL_COMMUNITY): Payer: Self-pay | Admitting: Emergency Medicine

## 2013-04-09 ENCOUNTER — Inpatient Hospital Stay (HOSPITAL_COMMUNITY): Payer: Medicare Other | Admitting: Anesthesiology

## 2013-04-09 ENCOUNTER — Encounter (HOSPITAL_COMMUNITY): Payer: Medicare Other | Admitting: Anesthesiology

## 2013-04-09 ENCOUNTER — Encounter (HOSPITAL_COMMUNITY)
Admission: EM | Disposition: A | Discharge status: Rehabilitation Facility | Payer: Self-pay | Source: Home / Self Care | Attending: Internal Medicine

## 2013-04-09 ENCOUNTER — Inpatient Hospital Stay (HOSPITAL_COMMUNITY)
Admission: EM | Admit: 2013-04-09 | Discharge: 2013-04-14 | DRG: 470 | Disposition: A | Discharge status: Rehabilitation Facility | Payer: Medicare Other | Attending: Internal Medicine | Admitting: Internal Medicine

## 2013-04-09 DIAGNOSIS — Y92009 Unspecified place in unspecified non-institutional (private) residence as the place of occurrence of the external cause: Secondary | ICD-10-CM

## 2013-04-09 DIAGNOSIS — Z23 Encounter for immunization: Secondary | ICD-10-CM

## 2013-04-09 DIAGNOSIS — N400 Enlarged prostate without lower urinary tract symptoms: Secondary | ICD-10-CM | POA: Diagnosis present

## 2013-04-09 DIAGNOSIS — I10 Essential (primary) hypertension: Secondary | ICD-10-CM | POA: Diagnosis present

## 2013-04-09 DIAGNOSIS — K117 Disturbances of salivary secretion: Secondary | ICD-10-CM

## 2013-04-09 DIAGNOSIS — G609 Hereditary and idiopathic neuropathy, unspecified: Secondary | ICD-10-CM | POA: Diagnosis present

## 2013-04-09 DIAGNOSIS — S72033A Displaced midcervical fracture of unspecified femur, initial encounter for closed fracture: Principal | ICD-10-CM | POA: Diagnosis present

## 2013-04-09 DIAGNOSIS — N138 Other obstructive and reflux uropathy: Secondary | ICD-10-CM | POA: Diagnosis present

## 2013-04-09 DIAGNOSIS — R1313 Dysphagia, pharyngeal phase: Secondary | ICD-10-CM | POA: Diagnosis present

## 2013-04-09 DIAGNOSIS — S72001A Fracture of unspecified part of neck of right femur, initial encounter for closed fracture: Secondary | ICD-10-CM

## 2013-04-09 DIAGNOSIS — Z820 Family history of epilepsy and other diseases of the nervous system: Secondary | ICD-10-CM

## 2013-04-09 DIAGNOSIS — G20 Parkinson's disease: Secondary | ICD-10-CM

## 2013-04-09 DIAGNOSIS — Z9852 Vasectomy status: Secondary | ICD-10-CM

## 2013-04-09 DIAGNOSIS — I714 Abdominal aortic aneurysm, without rupture, unspecified: Secondary | ICD-10-CM | POA: Diagnosis present

## 2013-04-09 DIAGNOSIS — K5900 Constipation, unspecified: Secondary | ICD-10-CM | POA: Diagnosis not present

## 2013-04-09 DIAGNOSIS — R42 Dizziness and giddiness: Secondary | ICD-10-CM

## 2013-04-09 DIAGNOSIS — G238 Other specified degenerative diseases of basal ganglia: Secondary | ICD-10-CM | POA: Diagnosis present

## 2013-04-09 DIAGNOSIS — R339 Retention of urine, unspecified: Secondary | ICD-10-CM | POA: Diagnosis present

## 2013-04-09 DIAGNOSIS — Z8249 Family history of ischemic heart disease and other diseases of the circulatory system: Secondary | ICD-10-CM

## 2013-04-09 DIAGNOSIS — Z79899 Other long term (current) drug therapy: Secondary | ICD-10-CM

## 2013-04-09 DIAGNOSIS — S72009A Fracture of unspecified part of neck of unspecified femur, initial encounter for closed fracture: Secondary | ICD-10-CM

## 2013-04-09 DIAGNOSIS — Z9089 Acquired absence of other organs: Secondary | ICD-10-CM

## 2013-04-09 DIAGNOSIS — Z7982 Long term (current) use of aspirin: Secondary | ICD-10-CM

## 2013-04-09 DIAGNOSIS — Z7901 Long term (current) use of anticoagulants: Secondary | ICD-10-CM

## 2013-04-09 DIAGNOSIS — Z9221 Personal history of antineoplastic chemotherapy: Secondary | ICD-10-CM

## 2013-04-09 DIAGNOSIS — S7291XA Unspecified fracture of right femur, initial encounter for closed fracture: Secondary | ICD-10-CM

## 2013-04-09 DIAGNOSIS — S72001S Fracture of unspecified part of neck of right femur, sequela: Secondary | ICD-10-CM

## 2013-04-09 DIAGNOSIS — Z8551 Personal history of malignant neoplasm of bladder: Secondary | ICD-10-CM

## 2013-04-09 DIAGNOSIS — Z87891 Personal history of nicotine dependence: Secondary | ICD-10-CM

## 2013-04-09 DIAGNOSIS — E785 Hyperlipidemia, unspecified: Secondary | ICD-10-CM | POA: Diagnosis present

## 2013-04-09 DIAGNOSIS — G243 Spasmodic torticollis: Secondary | ICD-10-CM

## 2013-04-09 DIAGNOSIS — W010XXA Fall on same level from slipping, tripping and stumbling without subsequent striking against object, initial encounter: Secondary | ICD-10-CM | POA: Diagnosis present

## 2013-04-09 DIAGNOSIS — Z9181 History of falling: Secondary | ICD-10-CM

## 2013-04-09 DIAGNOSIS — N401 Enlarged prostate with lower urinary tract symptoms: Secondary | ICD-10-CM | POA: Diagnosis present

## 2013-04-09 HISTORY — PX: HIP ARTHROPLASTY: SHX981

## 2013-04-09 HISTORY — DX: Polyneuropathy, unspecified: G62.9

## 2013-04-09 LAB — CBC WITH DIFFERENTIAL/PLATELET
Basophils Relative: 1 % (ref 0–1)
HCT: 39.8 % (ref 39.0–52.0)
Hemoglobin: 14 g/dL (ref 13.0–17.0)
Lymphocytes Relative: 19 % (ref 12–46)
Lymphs Abs: 1.6 10*3/uL (ref 0.7–4.0)
MCHC: 35.2 g/dL (ref 30.0–36.0)
MCV: 91.1 fL (ref 78.0–100.0)
Monocytes Absolute: 0.6 10*3/uL (ref 0.1–1.0)
Monocytes Relative: 7 % (ref 3–12)
Neutro Abs: 5.9 10*3/uL (ref 1.7–7.7)
RBC: 4.37 MIL/uL (ref 4.22–5.81)
RDW: 12.9 % (ref 11.5–15.5)

## 2013-04-09 LAB — BASIC METABOLIC PANEL
BUN: 23 mg/dL (ref 6–23)
Calcium: 9.3 mg/dL (ref 8.4–10.5)
Chloride: 101 mEq/L (ref 96–112)
GFR calc Af Amer: 89 mL/min — ABNORMAL LOW (ref >90)
Glucose, Bld: 105 mg/dL — ABNORMAL HIGH (ref 70–99)
Potassium: 3.8 mEq/L (ref 3.5–5.1)

## 2013-04-09 LAB — PROTIME-INR: INR: 1.04 (ref 0.00–1.49)

## 2013-04-09 LAB — TYPE AND SCREEN
ABO/RH(D): A POS
Antibody Screen: NEGATIVE

## 2013-04-09 LAB — ABO/RH: ABO/RH(D): A POS

## 2013-04-09 SURGERY — HEMIARTHROPLASTY, HIP, DIRECT ANTERIOR APPROACH, FOR FRACTURE
Anesthesia: General | Laterality: Right | Wound class: Clean

## 2013-04-09 MED ORDER — CEFAZOLIN SODIUM-DEXTROSE 2-3 GM-% IV SOLR
INTRAVENOUS | Status: AC
  Administered 2013-04-09: 2 g via INTRAVENOUS
  Filled 2013-04-09: qty 50

## 2013-04-09 MED ORDER — ACETAMINOPHEN 650 MG RE SUPP: 650.0000 mg | Freq: Four times a day (QID) | RECTAL | Status: DC | PRN

## 2013-04-09 MED ORDER — PROPOFOL 10 MG/ML IV BOLUS
INTRAVENOUS | Status: DC | PRN
  Administered 2013-04-09: 100 mg via INTRAVENOUS

## 2013-04-09 MED ORDER — CEFAZOLIN SODIUM-DEXTROSE 2-3 GM-% IV SOLR
2.0000 g | Freq: Four times a day (QID) | INTRAVENOUS | Status: AC
  Administered 2013-04-09 – 2013-04-10 (×2): 2 g via INTRAVENOUS
  Filled 2013-04-09 (×2): qty 50

## 2013-04-09 MED ORDER — MENTHOL 3 MG MT LOZG: 1.0000 | LOZENGE | OROMUCOSAL | Status: DC | PRN

## 2013-04-09 MED ORDER — ALBUTEROL SULFATE HFA 108 (90 BASE) MCG/ACT IN AERS
INHALATION_SPRAY | RESPIRATORY_TRACT | Status: DC | PRN
  Administered 2013-04-09 (×2): 3 via RESPIRATORY_TRACT

## 2013-04-09 MED ORDER — FENTANYL CITRATE 0.05 MG/ML IJ SOLN: 50.0000 ug | Freq: Once | INTRAMUSCULAR | Status: DC

## 2013-04-09 MED ORDER — GLYCOPYRROLATE 0.2 MG/ML IJ SOLN
INTRAMUSCULAR | Status: DC | PRN
  Administered 2013-04-09: 0.4 mg via INTRAVENOUS

## 2013-04-09 MED ORDER — CHLORHEXIDINE GLUCONATE 4 % EX LIQD: 60.0000 mL | Freq: Once | CUTANEOUS | Status: DC

## 2013-04-09 MED ORDER — LACTATED RINGERS IV SOLN
INTRAVENOUS | Status: DC
  Administered 2013-04-09 – 2013-04-12 (×2): via INTRAVENOUS

## 2013-04-09 MED ORDER — HYDRALAZINE HCL 20 MG/ML IJ SOLN
10.0000 mg | Freq: Once | INTRAMUSCULAR | Status: AC
  Administered 2013-04-09: 10 mg via INTRAVENOUS
  Filled 2013-04-09: qty 1

## 2013-04-09 MED ORDER — PHENYLEPHRINE HCL 10 MG/ML IJ SOLN
10.0000 mg | INTRAMUSCULAR | Status: DC | PRN
  Administered 2013-04-09: 40 ug/min via INTRAVENOUS

## 2013-04-09 MED ORDER — SODIUM CHLORIDE 0.9 % IV SOLN
1000.0000 mL | INTRAVENOUS | Status: DC
  Administered 2013-04-09: 1000 mL via INTRAVENOUS

## 2013-04-09 MED ORDER — ADULT MULTIVITAMIN W/MINERALS CH
1.0000 | ORAL_TABLET | Freq: Every day | ORAL | Status: DC
  Administered 2013-04-10 – 2013-04-14 (×5): 1 via ORAL
  Filled 2013-04-09 (×6): qty 1

## 2013-04-09 MED ORDER — NEOSTIGMINE METHYLSULFATE 1 MG/ML IJ SOLN
INTRAMUSCULAR | Status: DC | PRN
  Administered 2013-04-09: 2 mg via INTRAVENOUS

## 2013-04-09 MED ORDER — METOCLOPRAMIDE HCL 5 MG/ML IJ SOLN: 5.0000 mg | Freq: Three times a day (TID) | INTRAMUSCULAR | Status: DC | PRN

## 2013-04-09 MED ORDER — METHOCARBAMOL 500 MG PO TABS: 500.0000 mg | ORAL_TABLET | Freq: Four times a day (QID) | ORAL | Status: DC | PRN

## 2013-04-09 MED ORDER — SODIUM CHLORIDE 0.9 % IV SOLN
INTRAVENOUS | Status: DC
  Administered 2013-04-10: 1 mL via INTRAVENOUS
  Administered 2013-04-11: 07:00:00 via INTRAVENOUS

## 2013-04-09 MED ORDER — CARBIDOPA-LEVODOPA 25-100 MG PO TABS
1.0000 | ORAL_TABLET | Freq: Three times a day (TID) | ORAL | Status: DC
  Administered 2013-04-10 – 2013-04-14 (×14): 1 via ORAL
  Filled 2013-04-09 (×19): qty 1

## 2013-04-09 MED ORDER — AMLODIPINE BESYLATE 5 MG PO TABS
5.0000 mg | ORAL_TABLET | Freq: Every day | ORAL | Status: DC
  Administered 2013-04-10 – 2013-04-11 (×2): 5 mg via ORAL
  Filled 2013-04-09 (×3): qty 1

## 2013-04-09 MED ORDER — DUTASTERIDE 0.5 MG PO CAPS
0.5000 mg | ORAL_CAPSULE | Freq: Every day | ORAL | Status: DC
  Administered 2013-04-09 – 2013-04-13 (×5): 0.5 mg via ORAL
  Filled 2013-04-09 (×7): qty 1

## 2013-04-09 MED ORDER — SELEGILINE HCL 5 MG PO CAPS
5.0000 mg | ORAL_CAPSULE | Freq: Two times a day (BID) | ORAL | Status: DC
  Administered 2013-04-10 – 2013-04-12 (×6): 5 mg via ORAL
  Filled 2013-04-09 (×11): qty 1

## 2013-04-09 MED ORDER — B COMPLEX-C PO TABS
1.0000 | ORAL_TABLET | Freq: Every day | ORAL | Status: DC
  Administered 2013-04-09 – 2013-04-14 (×6): 1 via ORAL
  Filled 2013-04-09 (×6): qty 1

## 2013-04-09 MED ORDER — FLUDROCORTISONE ACETATE 0.1 MG PO TABS
0.1000 mg | ORAL_TABLET | Freq: Every day | ORAL | Status: DC
  Administered 2013-04-10 – 2013-04-14 (×5): 0.1 mg via ORAL
  Filled 2013-04-09 (×5): qty 1

## 2013-04-09 MED ORDER — ENOXAPARIN SODIUM 40 MG/0.4ML ~~LOC~~ SOLN: 40.0000 mg | SUBCUTANEOUS | Status: DC

## 2013-04-09 MED ORDER — IRBESARTAN 150 MG PO TABS
150.0000 mg | ORAL_TABLET | Freq: Two times a day (BID) | ORAL | Status: DC
  Administered 2013-04-09 – 2013-04-14 (×10): 150 mg via ORAL
  Filled 2013-04-09 (×11): qty 1

## 2013-04-09 MED ORDER — METOCLOPRAMIDE HCL 10 MG PO TABS: 5.0000 mg | ORAL_TABLET | Freq: Three times a day (TID) | ORAL | Status: DC | PRN

## 2013-04-09 MED ORDER — HYDROMORPHONE HCL PF 1 MG/ML IJ SOLN
0.5000 mg | INTRAMUSCULAR | Status: DC | PRN
  Administered 2013-04-09 – 2013-04-10 (×3): 0.5 mg via INTRAVENOUS
  Filled 2013-04-09 (×3): qty 1

## 2013-04-09 MED ORDER — BISACODYL 10 MG RE SUPP: 10.0000 mg | Freq: Every day | RECTAL | Status: DC | PRN

## 2013-04-09 MED ORDER — OXYCODONE HCL 5 MG PO TABS: 5.0000 mg | ORAL_TABLET | Freq: Once | ORAL | Status: DC | PRN

## 2013-04-09 MED ORDER — ONDANSETRON HCL 4 MG/2ML IJ SOLN
4.0000 mg | Freq: Three times a day (TID) | INTRAMUSCULAR | Status: AC | PRN
  Administered 2013-04-09: 4 mg via INTRAVENOUS
  Filled 2013-04-09: qty 2

## 2013-04-09 MED ORDER — ATORVASTATIN CALCIUM 20 MG PO TABS
20.0000 mg | ORAL_TABLET | Freq: Every day | ORAL | Status: DC
  Administered 2013-04-10 – 2013-04-14 (×5): 20 mg via ORAL
  Filled 2013-04-09 (×5): qty 1

## 2013-04-09 MED ORDER — OXYCODONE HCL 5 MG/5ML PO SOLN: 5.0000 mg | Freq: Once | ORAL | Status: DC | PRN

## 2013-04-09 MED ORDER — SODIUM CHLORIDE 0.9 % IR SOLN
Status: DC | PRN
  Administered 2013-04-09: 1000 mL

## 2013-04-09 MED ORDER — ENOXAPARIN SODIUM 40 MG/0.4ML ~~LOC~~ SOLN
40.0000 mg | SUBCUTANEOUS | Status: DC
  Administered 2013-04-10 – 2013-04-14 (×5): 40 mg via SUBCUTANEOUS
  Filled 2013-04-09 (×5): qty 0.4

## 2013-04-09 MED ORDER — HYDROCODONE-ACETAMINOPHEN 5-325 MG PO TABS: 1.0000 | ORAL_TABLET | Freq: Four times a day (QID) | ORAL | Status: DC | PRN

## 2013-04-09 MED ORDER — VITAMIN D3 25 MCG (1000 UNIT) PO TABS
2000.0000 [IU] | ORAL_TABLET | Freq: Every day | ORAL | Status: DC
  Administered 2013-04-09 – 2013-04-14 (×6): 2000 [IU] via ORAL
  Filled 2013-04-09 (×6): qty 2

## 2013-04-09 MED ORDER — MORPHINE SULFATE 2 MG/ML IJ SOLN
0.5000 mg | INTRAMUSCULAR | Status: DC | PRN
Start: 2013-04-09 — End: 2013-04-14
  Filled 2013-04-09: qty 1

## 2013-04-09 MED ORDER — LIDOCAINE HCL (CARDIAC) 20 MG/ML IV SOLN
INTRAVENOUS | Status: DC | PRN
  Administered 2013-04-09: 100 mg via INTRAVENOUS

## 2013-04-09 MED ORDER — LACTATED RINGERS IV SOLN
INTRAVENOUS | Status: DC | PRN
  Administered 2013-04-09 (×2): via INTRAVENOUS

## 2013-04-09 MED ORDER — ASPIRIN EC 81 MG PO TBEC
81.0000 mg | DELAYED_RELEASE_TABLET | Freq: Every day | ORAL | Status: DC
  Administered 2013-04-09 – 2013-04-13 (×5): 81 mg via ORAL
  Filled 2013-04-09 (×6): qty 1

## 2013-04-09 MED ORDER — HYDROCODONE-ACETAMINOPHEN 5-325 MG PO TABS: 1.0000 | ORAL_TABLET | ORAL | Status: DC | PRN

## 2013-04-09 MED ORDER — FENTANYL CITRATE 0.05 MG/ML IJ SOLN
INTRAMUSCULAR | Status: DC | PRN
  Administered 2013-04-09 (×5): 50 ug via INTRAVENOUS

## 2013-04-09 MED ORDER — B COMPLEX PO TABS: 1.0000 | ORAL_TABLET | Freq: Every day | ORAL | Status: DC

## 2013-04-09 MED ORDER — HYDROMORPHONE HCL PF 1 MG/ML IJ SOLN: 0.2500 mg | INTRAMUSCULAR | Status: DC | PRN

## 2013-04-09 MED ORDER — HYDROCODONE-ACETAMINOPHEN 5-325 MG PO TABS
1.0000 | ORAL_TABLET | Freq: Four times a day (QID) | ORAL | Status: DC | PRN
  Administered 2013-04-09 – 2013-04-11 (×6): 2 via ORAL
  Filled 2013-04-09 (×6): qty 2

## 2013-04-09 MED ORDER — DOCUSATE SODIUM 100 MG PO CAPS
100.0000 mg | ORAL_CAPSULE | Freq: Two times a day (BID) | ORAL | Status: DC
  Administered 2013-04-09 – 2013-04-14 (×9): 100 mg via ORAL
  Filled 2013-04-09 (×13): qty 1

## 2013-04-09 MED ORDER — ONDANSETRON HCL 4 MG PO TABS: 4.0000 mg | ORAL_TABLET | Freq: Four times a day (QID) | ORAL | Status: DC | PRN

## 2013-04-09 MED ORDER — ACETAMINOPHEN 325 MG PO TABS
650.0000 mg | ORAL_TABLET | Freq: Four times a day (QID) | ORAL | Status: DC | PRN
  Administered 2013-04-10: 650 mg via ORAL
  Filled 2013-04-09: qty 2

## 2013-04-09 MED ORDER — PHENOL 1.4 % MT LIQD: 1.0000 | OROMUCOSAL | Status: DC | PRN

## 2013-04-09 MED ORDER — TAMSULOSIN HCL 0.4 MG PO CAPS
0.4000 mg | ORAL_CAPSULE | Freq: Every day | ORAL | Status: DC
  Administered 2013-04-09 – 2013-04-13 (×5): 0.4 mg via ORAL
  Filled 2013-04-09 (×6): qty 1

## 2013-04-09 MED ORDER — MIDAZOLAM HCL 2 MG/2ML IJ SOLN: 1.0000 mg | INTRAMUSCULAR | Status: DC | PRN

## 2013-04-09 MED ORDER — METHOCARBAMOL 100 MG/ML IJ SOLN
500.0000 mg | Freq: Four times a day (QID) | INTRAVENOUS | Status: DC | PRN
  Filled 2013-04-09 (×2): qty 5

## 2013-04-09 MED ORDER — BISACODYL 10 MG RE SUPP
10.0000 mg | Freq: Every day | RECTAL | Status: DC | PRN
  Administered 2013-04-13: 10 mg via RECTAL
  Filled 2013-04-09: qty 1

## 2013-04-09 MED ORDER — ONDANSETRON HCL 4 MG/2ML IJ SOLN
4.0000 mg | Freq: Four times a day (QID) | INTRAMUSCULAR | Status: DC | PRN
  Administered 2013-04-09: 4 mg via INTRAVENOUS
  Filled 2013-04-09: qty 2

## 2013-04-09 MED ORDER — CARBIDOPA-LEVODOPA ER 50-200 MG PO TBCR
1.0000 | EXTENDED_RELEASE_TABLET | Freq: Every day | ORAL | Status: DC
  Administered 2013-04-09 – 2013-04-13 (×5): 1 via ORAL
  Filled 2013-04-09 (×6): qty 1

## 2013-04-09 MED ORDER — POTASSIUM CHLORIDE IN NACL 20-0.45 MEQ/L-% IV SOLN: INTRAVENOUS | Status: DC

## 2013-04-09 MED ORDER — FERROUS SULFATE 325 (65 FE) MG PO TABS
325.0000 mg | ORAL_TABLET | Freq: Three times a day (TID) | ORAL | Status: DC
  Administered 2013-04-09 – 2013-04-14 (×14): 325 mg via ORAL
  Filled 2013-04-09 (×17): qty 1

## 2013-04-09 MED ORDER — VITAMIN D 50 MCG (2000 UT) PO CAPS
2000.0000 [IU] | ORAL_CAPSULE | Freq: Every day | ORAL | Status: DC
Start: 2013-04-09 — End: 2013-04-09

## 2013-04-09 SURGICAL SUPPLY — 46 items
BLADE SAW SAG 73X25 THK (BLADE) ×1
BLADE SAW SGTL 73X25 THK (BLADE) ×1 IMPLANT
BRUSH FEMORAL CANAL (MISCELLANEOUS) IMPLANT
CAPT HIP HD POR BIPOL/UNIPOL ×1 IMPLANT
CLOTH BEACON ORANGE TIMEOUT ST (SAFETY) ×1 IMPLANT
COVER SURGICAL LIGHT HANDLE (MISCELLANEOUS) ×2 IMPLANT
DRAPE INCISE IOBAN 66X45 STRL (DRAPES) ×3 IMPLANT
DRAPE ORTHO SPLIT 77X108 STRL (DRAPES) ×4
DRAPE SURG ORHT 6 SPLT 77X108 (DRAPES) ×2 IMPLANT
DRAPE U-SHAPE 47X51 STRL (DRAPES) ×2 IMPLANT
DRSG MEPILEX BORDER 4X8 (GAUZE/BANDAGES/DRESSINGS) ×1 IMPLANT
DURAPREP 26ML APPLICATOR (WOUND CARE) ×2 IMPLANT
ELECT BLADE 4.0 EZ CLEAN MEGAD (MISCELLANEOUS)
ELECT REM PT RETURN 9FT ADLT (ELECTROSURGICAL) ×2
ELECTRODE BLDE 4.0 EZ CLN MEGD (MISCELLANEOUS) IMPLANT
ELECTRODE REM PT RTRN 9FT ADLT (ELECTROSURGICAL) ×1 IMPLANT
GLOVE BIOGEL PI ORTHO PRO 7.5 (GLOVE) ×1
GLOVE BIOGEL PI ORTHO PRO SZ8 (GLOVE) ×1
GLOVE ORTHO TXT STRL SZ7.5 (GLOVE) ×2 IMPLANT
GLOVE PI ORTHO PRO STRL 7.5 (GLOVE) ×1 IMPLANT
GLOVE PI ORTHO PRO STRL SZ8 (GLOVE) ×1 IMPLANT
GLOVE SURG ORTHO 8.5 STRL (GLOVE) ×2 IMPLANT
GOWN STRL REIN XL XLG (GOWN DISPOSABLE) ×6 IMPLANT
HANDPIECE INTERPULSE COAX TIP (DISPOSABLE)
KIT BASIN OR (CUSTOM PROCEDURE TRAY) ×2 IMPLANT
KIT ROOM TURNOVER OR (KITS) ×2 IMPLANT
MANIFOLD NEPTUNE II (INSTRUMENTS) ×2 IMPLANT
NS IRRIG 1000ML POUR BTL (IV SOLUTION) ×3 IMPLANT
PACK TOTAL JOINT (CUSTOM PROCEDURE TRAY) ×2 IMPLANT
PAD ARMBOARD 7.5X6 YLW CONV (MISCELLANEOUS) ×4 IMPLANT
SET HNDPC FAN SPRY TIP SCT (DISPOSABLE) IMPLANT
STAPLER VISISTAT 35W (STAPLE) ×2 IMPLANT
STRIP CLOSURE SKIN 1/2X4 (GAUZE/BANDAGES/DRESSINGS) IMPLANT
SUT ETHIBOND NAB CT1 #1 30IN (SUTURE) ×6 IMPLANT
SUT MNCRL AB 3-0 PS2 18 (SUTURE) IMPLANT
SUT VIC AB 0 CT1 27 (SUTURE) ×2
SUT VIC AB 0 CT1 27XBRD ANBCTR (SUTURE) ×1 IMPLANT
SUT VIC AB 1 CT1 27 (SUTURE)
SUT VIC AB 1 CT1 27XBRD ANBCTR (SUTURE) IMPLANT
SUT VIC AB 2-0 CT1 27 (SUTURE) ×2
SUT VIC AB 2-0 CT1 TAPERPNT 27 (SUTURE) ×1 IMPLANT
TOWEL OR 17X24 6PK STRL BLUE (TOWEL DISPOSABLE) ×2 IMPLANT
TOWEL OR 17X26 10 PK STRL BLUE (TOWEL DISPOSABLE) ×2 IMPLANT
TOWER CARTRIDGE SMART MIX (DISPOSABLE) IMPLANT
TRAY FOLEY CATH 16FRSI W/METER (SET/KITS/TRAYS/PACK) ×1 IMPLANT
WATER STERILE IRR 1000ML POUR (IV SOLUTION) ×6 IMPLANT

## 2013-04-09 NOTE — ED Provider Notes (Signed)
CSN: [id number]     Arrival date & time 04/09/13  0920 History   First MD Initiated Contact with Patient 04/09/13 [phone]     Chief Complaint  Patient presents with  . Fall  . Hip Pain    Right hip   (Consider location/radiation/quality/duration/timing/severity/associated sxs/prior Treatment) HPI This 79 year old male accidentally lost his balance this morning in the kitchen, he has Parkinson's disease and has a history of frequent falls, today he fell landing in his right hip causing right hip pain he is unable to walk due to right hip pain, he does not want pain medicine upon arrival to the ED he did not receive pain medicine from EMS, he does not have pain if he is not moving his right hip, he is no head or neck injury, he is no amnesia no lightheadedness no vertigo no palpitations no chest pain no shortness of breath no new focal weakness or numbness, he is no weakness or numbness distal to his right hip, treatment prior to arrival consisted EMS transport to the ED, his last oral intake was a small amount of orange juice this morning just prior to arrival with no solid foods this morning. He has isolated injury to his right hip only. Past Medical History  Diagnosis Date  . Hypertension   . Hyperlipidemia   . Aneurysm of infrarenal abdominal aorta SUPRAILIAC  4CM PER CT 10-20-2010--  STABLE  . Bladder cancer RECURRENT  (FIRST DX  2001)    HX TURBT AND CHEMO BLADDER INSTILLATION TX'S  . Parkinson disease   . Frequency of urination   . Nocturia   . Orthostatic hypotension   . Peripheral neuropathy    Past Surgical History  Procedure Laterality Date  . Tonsillectomy and adenoidectomy  1938  . Left inguinal hernia repair  1996  . Transurethral resection of bladder tumor  09-11-1999    BLADDER CANCER  . Cysto/ retrograde pyelogram/ bladder bx's  07-03-2004  . Cysto/ bladder bx/ fulgeration bladder tumor  10-09-2004  . Appendectomy  10-20-2010  . Cystoscopy with biopsy  11/28/2011   Procedure: CYSTOSCOPY WITH BIOPSY;  Surgeon: Antony Haste, MD;  Location: [facility];  Service: Urology;  Laterality: N/A;  . Vasectomy     Family History  Problem Relation Age of Onset  . Heart failure Father   . Alzheimer's disease Mother    History  Substance Use Topics  . Smoking status: Former Smoker -- 2.00 packs/day for 50 years    Types: Cigarettes    Quit date: 05/13/1999  . Smokeless tobacco: Never Used  . Alcohol Use: No    Review of Systems 10 Systems reviewed and are negative for acute change except as noted in the HPI. Allergies  Review of patient's allergies indicates no known allergies.  Home Medications   Current Outpatient Rx  Name  Route  Sig  Dispense  Refill  . enoxaparin (LOVENOX) 40 MG/0.4ML injection   Subcutaneous   Inject 0.4 mLs (40 mg total) into the skin daily. 30 days post op   30 mL   0   . HYDROcodone-acetaminophen (NORCO) 5-325 MG per tablet   Oral   Take 1 tablet by mouth every 6 (six) hours as needed for moderate pain.   60 tablet   0    BP 136/56  Pulse 95  Temp(Src) 97.9 F (36.6 C) (Oral)  Resp 16  Ht 5\' 6"  (1.676 m)  Wt 170 lb (77.111 kg)  BMI 27.45 kg/m2  SpO2 95% Physical Exam  Nursing note and vitals reviewed. Constitutional:  Awake, alert, nontoxic appearance.  HENT:  Head: Atraumatic.  Eyes: Right eye exhibits no discharge. Left eye exhibits no discharge.  Neck: Neck supple.  Cervical spine nontender  Cardiovascular: Normal rate and regular rhythm.   No murmur heard. Pulmonary/Chest: Effort normal and breath sounds normal. No respiratory distress. He has no wheezes. He has no rales. He exhibits no tenderness.  Abdominal: Soft. Bowel sounds are normal. There is no tenderness. There is no rebound and no guarding.  Musculoskeletal: He exhibits tenderness. He exhibits no edema.  Baseline ROM except limited at right hip, no obvious new focal weakness. Back arms and left leg nontender with  good range of motion. Right leg is no tenderness to the foot ankle lower leg knee or thigh, he does have mild tenderness to the right hip with limited range of motion to the right hip with mild right leg shortening and right hip pain with minimal movement, his right ankle posterior tibial pulse intact capillary refill less than 2 seconds with normal light touch and good movement to the right foot.  Neurological:  Mental status and motor strength appears baseline for patient and situation.  Skin: No rash noted.  Psychiatric: He has a normal mood and affect.    ED Course  Procedures (including critical care time) Patient / Family / Caregiver informed of clinical course, understand medical decision-making process, and agree with plan.d/w Ortho and [facility] for admit, likely to OR today. Labs Review Labs Reviewed  BASIC METABOLIC PANEL - Abnormal; Notable for the following:    Glucose, Bld 105 (*)    GFR calc non Af Amer 76 (*)    GFR calc Af Amer 89 (*)    All other components within normal limits  CBC WITH DIFFERENTIAL  PROTIME-INR  CBC  BASIC METABOLIC PANEL  TYPE AND SCREEN  ABO/RH   Imaging Review Dg Chest 1 View  04/09/2013   CLINICAL DATA:  Fall and hip pain.  EXAM: CHEST - 1 VIEW  COMPARISON:  03/01/2013  FINDINGS: The lungs are clear bilaterally. Stable appearance of the heart and mediastinum. The trachea is midline. No evidence for a pneumothorax. Bony thorax is intact.  IMPRESSION: No acute cardiopulmonary disease.   Electronically Signed   By: Richarda Overlie M.D.   On: 04/09/2013 11:28   Dg Hip Complete Right  04/09/2013   CLINICAL DATA:  Fall and right hip pain.  EXAM: RIGHT HIP - COMPLETE 2+ VIEW  COMPARISON:  None.  FINDINGS: There is a mildly displaced right subcapital femoral fracture. The right femoral trochanteric region is mildly displaced superiorly. The right femoral head is located. The pelvic bony ring is intact. No other definite fractures.  IMPRESSION: Displaced right  femoral subcapital fracture.   Electronically Signed   By: Richarda Overlie M.D.   On: 04/09/2013 11:21   Dg Pelvis Portable  04/09/2013   CLINICAL DATA:  Postop  EXAM: PORTABLE PELVIS 1-2 VIEWS  COMPARISON:  1030 hr  FINDINGS: Right hip hemiarthroplasty has been placed. Anatomic alignment of the osseous and prostatic structures. No breakage or loosening of the hardware.  IMPRESSION: Right hip hemiarthroplasty anatomically aligned.   Electronically Signed   By: Maryclare Bean M.D.   On: 04/09/2013 17:35    EKG Interpretation    Date/Time:  Saturday April 09 2013 81:19:14 EST Ventricular Rate:  89 PR Interval:  165 QRS Duration: 95 QT Interval:  356 QTC Calculation: 433 R  Axis:   78 Text Interpretation:  Age not entered, assumed to be  79 years old for purpose of ECG interpretation Sinus tachycardia Paired ventricular premature complexes Low voltage, precordial leads No significant change since last tracing Confirmed by [facility]  MD, Jeray Shugart ([id number]) on 04/09/2013 9:38:58 AM            MDM   1. Closed right hip fracture, initial encounter   2. BPH (benign prostatic hypertrophy)   3. Dysphagia, pharyngeal   4. Hyperlipidemia   5. Parkinsonism   6. Unspecified essential hypertension   7. Hip fracture, right, closed, initial encounter    The patient appears reasonably stabilized for admission considering the current resources, flow, and capabilities available in the ED at this time, and I doubt any other [facility] requiring further screening and/or treatment in the ED prior to admission.    Hurman Horn, MD 04/10/13 [phone]

## 2013-04-09 NOTE — ED Notes (Signed)
Pt transferred to OR with Belongings and paperwork. No issues with transportation. Clothes given to family. Family in short stay waiting room.

## 2013-04-09 NOTE — Anesthesia Postprocedure Evaluation (Signed)
  Anesthesia Post-op Note  Patient: Ivan Clark  Procedure(s) Performed: Procedure(s): ARTHROPLASTY BIPOLAR HIP (Right)  Patient Location: PACU  Anesthesia Type:General  Level of Consciousness: awake  Airway and Oxygen Therapy: Patient Spontanous Breathing  Post-op Pain: mild  Post-op Assessment: Post-op Vital signs reviewed, Patient's Cardiovascular Status Stable, Respiratory Function Stable, Patent Airway, No signs of Nausea or vomiting and Pain level controlled  Post-op Vital Signs: Reviewed and stable  Complications: No apparent anesthesia complications

## 2013-04-09 NOTE — ED Notes (Signed)
Admitting at bedside 

## 2013-04-09 NOTE — Anesthesia Procedure Notes (Signed)
Procedure Name: Intubation Date/Time: 04/09/2013 3:16 PM Performed by: Alanda Amass A Pre-anesthesia Checklist: Patient identified, Timeout performed, Emergency Drugs available, Suction available and Patient being monitored Patient Re-evaluated:Patient Re-evaluated prior to inductionOxygen Delivery Method: Circle system utilized Preoxygenation: Pre-oxygenation with 100% oxygen Intubation Type: IV induction Ventilation: Mask ventilation without difficulty Grade View: Grade II Tube type: Oral Tube size: 8.0 mm Number of attempts: 1 Airway Equipment and Method: Stylet Placement Confirmation: ETT inserted through vocal cords under direct vision,  breath sounds checked- equal and bilateral and positive ETCO2 Secured at: 22 cm Tube secured with: Tape Dental Injury: Teeth and Oropharynx as per pre-operative assessment

## 2013-04-09 NOTE — Preoperative (Signed)
Beta Blockers   Reason not to administer Beta Blockers:not on home beta blocker 

## 2013-04-09 NOTE — Consult Note (Signed)
Reason for Consult:Broken right hip Referring Physician: Dr Paul Dykes is an 79 y.o. male.  HPI: 79 yo male with Parkinson's who fell this morning.  Patient unaware of what caused the fall.  Patient complains of right hip pain and unable to stand on the right LE.  Denies other complaints.  Past Medical History  Diagnosis Date  . Hypertension   . Hyperlipidemia   . Aneurysm of infrarenal abdominal aorta SUPRAILIAC  4CM PER CT 10-20-2010--  STABLE  . Bladder cancer RECURRENT  (FIRST DX  2001)    HX TURBT AND CHEMO BLADDER INSTILLATION TX'S  . Parkinson disease   . Frequency of urination   . Nocturia   . Orthostatic hypotension   . Peripheral neuropathy     Past Surgical History  Procedure Laterality Date  . Tonsillectomy and adenoidectomy  1938  . Left inguinal hernia repair  1996  . Transurethral resection of bladder tumor  09-11-1999    BLADDER CANCER  . Cysto/ retrograde pyelogram/ bladder bx's  07-03-2004  . Cysto/ bladder bx/ fulgeration bladder tumor  10-09-2004  . Appendectomy  10-20-2010  . Cystoscopy with biopsy  11/28/2011    Procedure: CYSTOSCOPY WITH BIOPSY;  Surgeon: Antony Haste, MD;  Location: [facility];  Service: Urology;  Laterality: N/A;  . Vasectomy      Family History  Problem Relation Age of Onset  . Heart failure Father   . Alzheimer's disease Mother     Social History:  reports that he quit smoking about 13 years ago. His smoking use included Cigarettes. He has a 100 pack-year smoking history. He has never used smokeless tobacco. He reports that he does not drink alcohol or use illicit drugs.  Allergies: No Known Allergies  Medications: I have reviewed the patient's current medications.  Results for orders placed during the hospital encounter of 04/09/13 (from the past 48 hour(s))  BASIC METABOLIC PANEL     Status: Abnormal   Collection Time    04/09/13  9:44 AM      Result Value Range   Sodium 140   135 - 145 mEq/L   Potassium 3.8  3.5 - 5.1 mEq/L   Chloride 101  96 - 112 mEq/L   CO2 27  19 - 32 mEq/L   Glucose, Bld 105 (*) 70 - 99 mg/dL   BUN 23  6 - 23 mg/dL   Creatinine, Ser 4.09  0.50 - 1.35 mg/dL   Calcium 9.3  8.4 - 81.1 mg/dL   GFR calc non Af Amer 76 (*) >90 mL/min   GFR calc Af Amer 89 (*) >90 mL/min   Comment: (NOTE)     The eGFR has been calculated using the CKD EPI equation.     This calculation has not been validated in all clinical situations.     eGFR's persistently <90 mL/min signify possible Chronic Kidney     Disease.  CBC WITH DIFFERENTIAL     Status: None   Collection Time    04/09/13  9:44 AM      Result Value Range   WBC 8.3  4.0 - 10.5 K/uL   RBC 4.37  4.22 - 5.81 MIL/uL   Hemoglobin 14.0  13.0 - 17.0 g/dL   HCT 91.4  78.2 - 95.6 %   MCV 91.1  78.0 - 100.0 fL   MCH 32.0  26.0 - 34.0 pg   MCHC 35.2  30.0 - 36.0 g/dL   RDW 12.9  11.5 - 15.5 %   Platelets 222  150 - 400 K/uL   Neutrophils Relative % 71  43 - 77 %   Neutro Abs 5.9  1.7 - 7.7 K/uL   Lymphocytes Relative 19  12 - 46 %   Lymphs Abs 1.6  0.7 - 4.0 K/uL   Monocytes Relative 7  3 - 12 %   Monocytes Absolute 0.6  0.1 - 1.0 K/uL   Eosinophils Relative 2  0 - 5 %   Eosinophils Absolute 0.2  0.0 - 0.7 K/uL   Basophils Relative 1  0 - 1 %   Basophils Absolute 0.0  0.0 - 0.1 K/uL  PROTIME-INR     Status: None   Collection Time    04/09/13  9:44 AM      Result Value Range   Prothrombin Time 13.4  11.6 - 15.2 seconds   INR 1.04  0.00 - 1.49  TYPE AND SCREEN     Status: None   Collection Time    04/09/13 10:25 AM      Result Value Range   ABO/RH(D) A POS     Antibody Screen NEG     Sample Expiration 04/12/2013    ABO/RH     Status: None   Collection Time    04/09/13 10:25 AM      Result Value Range   ABO/RH(D) A POS      Dg Chest 1 View  04/09/2013   CLINICAL DATA:  Fall and hip pain.  EXAM: CHEST - 1 VIEW  COMPARISON:  03/01/2013  FINDINGS: The lungs are clear bilaterally. Stable  appearance of the heart and mediastinum. The trachea is midline. No evidence for a pneumothorax. Bony thorax is intact.  IMPRESSION: No acute cardiopulmonary disease.   Electronically Signed   By: Richarda Overlie M.D.   On: 04/09/2013 11:28   Dg Hip Complete Right  04/09/2013   CLINICAL DATA:  Fall and right hip pain.  EXAM: RIGHT HIP - COMPLETE 2+ VIEW  COMPARISON:  None.  FINDINGS: There is a mildly displaced right subcapital femoral fracture. The right femoral trochanteric region is mildly displaced superiorly. The right femoral head is located. The pelvic bony ring is intact. No other definite fractures.  IMPRESSION: Displaced right femoral subcapital fracture.   Electronically Signed   By: Richarda Overlie M.D.   On: 04/09/2013 11:21    ROS Blood pressure 180/75, pulse 82, temperature 97.7 F (36.5 C), temperature source Oral, resp. rate 20, height 5\' 6"  (1.676 m), weight 77.111 kg (170 lb), SpO2 97.00%. Physical Exam Neck and Back nontender and no step offs.  Bilateral shoulders and clavicles nontender and normal bilateral UE ROM and good strength.  Right LE : short and externally rotated., NVI distally, L LE with pain free AROM, no deformity. Assessment/Plan: Right hip femoral neck fracture, displaced. Discussed with the patient and family the need for right hip hemiarthroplasty.  Patient and family agree. Npo, orders for consent placed in EPIC.  Kimbley Sprague,STEVEN R 04/09/2013, 11:57 AM

## 2013-04-09 NOTE — Brief Op Note (Signed)
04/09/2013  4:29 PM  PATIENT:  Reed Breech  79 y.o. male  PRE-OPERATIVE DIAGNOSIS:  right femoral neck fracture, displaced  POST-OPERATIVE DIAGNOSIS:  Same  PROCEDURE:  Procedure(s): ARTHROPLASTY BIPOLAR HIP (Right), monopolar - DePuy triloc  SURGEON:  Surgeon(s) and Role:    * Verlee Rossetti, MD - Primary  PHYSICIAN ASSISTANT:   ASSISTANTS: Thea Gist, PA-C   ANESTHESIA:   general  EBL:  Total I/O In: -  Out: 300 [Urine:200; Blood:100]  BLOOD ADMINISTERED:none  DRAINS: none   LOCAL MEDICATIONS USED:  none  SPECIMEN:  No Specimen  DISPOSITION OF SPECIMEN:  N/A  COUNTS:  YES  TOURNIQUET:  * No tourniquets in log *  DICTATION: .Other Dictation: Dictation Number [id number]  PLAN OF CARE: Admit to inpatient   PATIENT DISPOSITION:  PACU - hemodynamically stable.   Delay start of Pharmacological VTE agent (>24hrs) due to surgical blood loss or risk of bleeding: no

## 2013-04-09 NOTE — Progress Notes (Signed)
Utilization review completed.  P.J. Jaia Alonge,RN,BSN Case Manager [phone]  

## 2013-04-09 NOTE — Anesthesia Preprocedure Evaluation (Addendum)
Anesthesia Evaluation  Patient identified by MRN, date of birth, ID band Patient awake    Reviewed: Allergy & Precautions, H&P , NPO status , Patient's Chart, lab work & pertinent test results  Airway Mallampati: II TM Distance: >3 FB Neck ROM: Full    Dental   Pulmonary COPDformer smoker,  + rhonchi         Cardiovascular hypertension, Rhythm:Regular Rate:Normal  4cm infrarenal AAA   Neuro/Psych Parkinsonism    GI/Hepatic   Endo/Other    Renal/GU      Musculoskeletal   Abdominal   Peds  Hematology   Anesthesia Other Findings H/o bladder ca Broken hip from fall-denies LOC  Reproductive/Obstetrics                         Anesthesia Physical Anesthesia Plan  ASA: III  Anesthesia Plan: General   Post-op Pain Management:    Induction: Intravenous  Airway Management Planned: Oral ETT  Additional Equipment:   Intra-op Plan:   Post-operative Plan: Extubation in OR  Informed Consent: I have reviewed the patients History and Physical, chart, labs and discussed the procedure including the risks, benefits and alternatives for the proposed anesthesia with the patient or authorized representative who has indicated his/her understanding and acceptance.     Plan Discussed with: CRNA and Surgeon  Anesthesia Plan Comments:         Anesthesia Quick Evaluation

## 2013-04-09 NOTE — H&P (Signed)
History and Physical       Hospital Admission Note Date: 04/09/2013  Patient name: Ivan Clark Medical record number: [id number] Date of birth: [birth year] Age: 79 y.o. Gender: male PCP:  Duane Lope, MD    Chief Complaint:  Fall with right hip pain today  HPI: Patient is an 79 year old male with history of hypertension, hyperlipidemia, autonomic dysfunction from Parkinson's disease, orthostatic hypotension accident he lost his balance this morning in the kitchen. He has history of frequent falls. He landed on his right hip and was unable to get up or walk due to right hip pain. Patient only has pain when he moves his right hip. X-ray of his right hip showed a displaced right femoral subcapital fracture. Orthopedics was consulted in the ER and he scheduled to have surgery today. Patient denied any dizziness, lightheadedness or any syncopal episode today. He denied any chest pain, palpitations, nausea, vomiting, fevers.  Review of Systems:  Constitutional: Denies fever, chills, diaphoresis, poor appetite and fatigue.  HEENT: Denies photophobia, eye pain, redness, hearing loss, ear pain, congestion, sore throat, rhinorrhea, sneezing, mouth sores, trouble swallowing, neck pain, neck stiffness and tinnitus.   Respiratory: Denies SOB, DOE, cough, chest tightness,  and wheezing.   Cardiovascular: Denies chest pain, palpitations and leg swelling.  Gastrointestinal: Denies nausea, vomiting, abdominal pain, diarrhea, constipation, blood in stool and abdominal distention.  Genitourinary: Denies dysuria, urgency, frequency, hematuria, flank pain and difficulty urinating.  Musculoskeletal: Denies myalgias, back pain, joint swelling, arthralgias + gait problem due to Parkinson's Skin: Denies pallor, rash and wound.  Neurological: Denies dizziness, seizures, syncope, weakness, light-headedness, numbness and headaches.  he has a history of  orthostatic hypotension on standing up, was started on Florinef by his PCP on November 21st Hematological: Denies adenopathy. Easy bruising, personal or family bleeding history  Psychiatric/Behavioral: Denies suicidal ideation, mood changes, confusion, nervousness, sleep disturbance and agitation  Past Medical History: Past Medical History  Diagnosis Date  . Hypertension   . Hyperlipidemia   . Aneurysm of infrarenal abdominal aorta SUPRAILIAC  4CM PER CT 10-20-2010--  STABLE  . Bladder cancer RECURRENT  (FIRST DX  2001)    HX TURBT AND CHEMO BLADDER INSTILLATION TX'S  . Parkinson disease   . Frequency of urination   . Nocturia   . Orthostatic hypotension   . Peripheral neuropathy    Past Surgical History  Procedure Laterality Date  . Tonsillectomy and adenoidectomy  1938  . Left inguinal hernia repair  1996  . Transurethral resection of bladder tumor  09-11-1999    BLADDER CANCER  . Cysto/ retrograde pyelogram/ bladder bx's  07-03-2004  . Cysto/ bladder bx/ fulgeration bladder tumor  10-09-2004  . Appendectomy  10-20-2010  . Cystoscopy with biopsy  11/28/2011    Procedure: CYSTOSCOPY WITH BIOPSY;  Surgeon: Antony Haste, MD;  Location: [facility];  Service: Urology;  Laterality: N/A;  . Vasectomy      Medications: Prior to Admission medications   Medication Sig Start Date End Date Taking? Authorizing Provider  amLODipine (NORVASC) 5 MG tablet Take 5 mg by mouth daily.   Yes Historical Provider, MD  aspirin EC 81 MG tablet Take 81 mg by mouth daily after supper.    Yes Historical Provider, MD  atorvastatin (LIPITOR) 20 MG tablet Take 20 mg by mouth daily. 02/07/13  Yes Historical Provider, MD  b complex vitamins tablet Take 1 tablet by mouth daily.   Yes Historical Provider, MD  carbidopa-levodopa (SINEMET CR) 50-200 MG  per tablet Take 1 tablet by mouth at bedtime.   Yes Historical Provider, MD  carbidopa-levodopa (SINEMET IR) 25-100 MG per tablet Take 1  tablet by mouth 3 (three) times daily with meals.    Yes Historical Provider, MD  Cholecalciferol (VITAMIN D) 2000 UNITS CAPS Take 2,000 Units by mouth daily.   Yes Historical Provider, MD  dutasteride (AVODART) 0.5 MG capsule Take 0.5 mg by mouth daily after supper.    Yes Historical Provider, MD  fludrocortisone (FLORINEF) 0.1 MG tablet Take 0.1 mg by mouth daily.   Yes Historical Provider, MD  irbesartan (AVAPRO) 150 MG tablet Take 150 mg by mouth 2 (two) times daily.    Yes Historical Provider, MD  Multiple Vitamin (MULTIVITAMIN WITH MINERALS) TABS Take 1 tablet by mouth daily.   Yes Historical Provider, MD  selegiline (ELDEPRYL) 5 MG capsule Take 5 mg by mouth 2 (two) times daily. One in the morning and one at noon.   Yes Historical Provider, MD  tamsulosin (FLOMAX) 0.4 MG CAPS capsule Take 0.4 mg by mouth daily after supper.   Yes Historical Provider, MD    Allergies:  No Known Allergies  Social History:  reports that he quit smoking about 13 years ago. His smoking use included Cigarettes. He has a 100 pack-year smoking history. He has never used smokeless tobacco. He reports that he does not drink alcohol or use illicit drugs. he currently lives at home, ambulates with walker  sometimes  Family History: Family History  Problem Relation Age of Onset  . Heart failure Father   . Alzheimer's disease Mother     Physical Exam: Blood pressure 180/75, pulse 82, temperature 97.7 F (36.5 C), temperature source Oral, resp. rate 20, height 5\' 6"  (1.676 m), weight 77.111 kg (170 lb), SpO2 97.00%. General: Alert, awake, oriented x3, in no acute distress. HEENT: normocephalic, atraumatic, anicteric sclera, pink conjunctiva, pupils equal and reactive to light and accomodation, oropharynx clear Neck: supple, no masses or lymphadenopathy, no goiter, no bruits  Heart: Regular rate and rhythm, without murmurs, rubs or gallops. Lungs: Clear to auscultation bilaterally, no wheezing, rales or  rhonchi. Abdomen: Soft, nontender, nondistended, positive bowel sounds, no masses. Extremities: No clubbing, cyanosis or edema with positive pedal pulses. Right lower date externally rotated and showed Neuro: Grossly intact, no focal neurological deficits Psych: alert and oriented x 3, normal mood and affect Skin: no rashes or lesions, warm and dry   LABS on Admission:  Basic Metabolic Panel:  Recent Labs Lab 04/09/13 0944  NA 140  K 3.8  CL 101  CO2 27  GLUCOSE 105*  BUN 23  CREATININE 0.92  CALCIUM 9.3   Liver Function Tests: No results found for this basename: AST, ALT, ALKPHOS, BILITOT, PROT, ALBUMIN,  in the last 168 hours No results found for this basename: LIPASE, AMYLASE,  in the last 168 hours No results found for this basename: AMMONIA,  in the last 168 hours CBC:  Recent Labs Lab 04/09/13 0944  WBC 8.3  NEUTROABS 5.9  HGB 14.0  HCT 39.8  MCV 91.1  PLT 222   Cardiac Enzymes: No results found for this basename: CKTOTAL, CKMB, CKMBINDEX, TROPONINI,  in the last 168 hours BNP: No components found with this basename: POCBNP,  CBG: No results found for this basename: GLUCAP,  in the last 168 hours   Radiological Exams on Admission: Dg Chest 1 View  04/09/2013   CLINICAL DATA:  Fall and hip pain.  EXAM: CHEST - 1  VIEW  COMPARISON:  03/01/2013  FINDINGS: The lungs are clear bilaterally. Stable appearance of the heart and mediastinum. The trachea is midline. No evidence for a pneumothorax. Bony thorax is intact.  IMPRESSION: No acute cardiopulmonary disease.   Electronically Signed   By: Richarda Overlie M.D.   On: 04/09/2013 11:28   Dg Hip Complete Right  04/09/2013   CLINICAL DATA:  Fall and right hip pain.  EXAM: RIGHT HIP - COMPLETE 2+ VIEW  COMPARISON:  None.  FINDINGS: There is a mildly displaced right subcapital femoral fracture. The right femoral trochanteric region is mildly displaced superiorly. The right femoral head is located. The pelvic bony ring is  intact. No other definite fractures.  IMPRESSION: Displaced right femoral subcapital fracture.   Electronically Signed   By: Richarda Overlie M.D.   On: 04/09/2013 11:21     Assessment/Plan Principal Problem:   Right femoral fracture - Orthopedics has been consulted, will keep the patient n.p.o., for surgery today. -  no Lovenox, start DVT prophylaxis after the surgery. Placed on SCDs for now  - Pain control, patient requested no morphine as it causes him severe urinary retention  - Will place on very low dose of Dilaudid as needed   Active Problems:   Parkinsonism:  - Continue Sinemet and selegiline    Hypertension: Somewhat uncontrolled  - Will give one dose of hydralazine 10 mgIV.  - Restart antihypertensives after the surgery     Hyperlipidemia - Restart the Lipitor after the surgery     BPH (benign prostatic hypertrophy) - Continue Flomax, if needed, his urologist is Dr. Jerilee Field   Orthostatic hypotension/autonomic dysfunction  - Will continue Florinef   Dysphagia: - Patient had an outpatient swallowing study done on 03/25/2013, once he is able to tolerate diet he should be on dysphagia 3, thin liquids.   DVT prophylaxis:  SCDs for now, place on Lovenox after surgery or per orthopedic recommendations   CODE STATUS:  full code   Family Communication: Admission, patients condition and plan of care including tests being ordered have been discussed with the patient, his wife and daughter who indicates understanding and agree with the plan and Code Status   Further plan will depend as patient's clinical course evolves and further radiologic and laboratory data become available.   Time Spent on Admission: 1 hour   Jaysie Benthall M.D. [facility] 04/09/2013, 12:14 PM Pager: [phone]  If 7PM-7AM, please contact night-coverage [email or web address] Password [email or web address]

## 2013-04-09 NOTE — ED Notes (Signed)
OR consent signed by patient prior to narcotics given

## 2013-04-09 NOTE — Transfer of Care (Signed)
Immediate Anesthesia Transfer of Care Note  Patient: Ivan Clark  Procedure(s) Performed: Procedure(s): ARTHROPLASTY BIPOLAR HIP (Right)  Patient Location: PACU  Anesthesia Type:General  Level of Consciousness: sedated  Airway & Oxygen Therapy: Patient Spontanous Breathing and Patient connected to nasal cannula oxygen  Post-op Assessment: Report given to PACU RN and Post -op Vital signs reviewed and stable  Post vital signs: Reviewed and stable  Complications: No apparent anesthesia complications

## 2013-04-09 NOTE — ED Notes (Signed)
Received pt from home with c/o trip and fall in kitchen pt denies head injury. C/o right hip pain. No deformity noted.

## 2013-04-10 DIAGNOSIS — S72009S Fracture of unspecified part of neck of unspecified femur, sequela: Secondary | ICD-10-CM

## 2013-04-10 LAB — BASIC METABOLIC PANEL
BUN: 24 mg/dL — ABNORMAL HIGH (ref 6–23)
Calcium: 8.4 mg/dL (ref 8.4–10.5)
Chloride: 100 mEq/L (ref 96–112)
Creatinine, Ser: 0.99 mg/dL (ref 0.50–1.35)
GFR calc Af Amer: 86 mL/min — ABNORMAL LOW (ref >90)
GFR calc non Af Amer: 74 mL/min — ABNORMAL LOW (ref >90)
Sodium: 135 mEq/L (ref 135–145)

## 2013-04-10 LAB — CBC
HCT: 32.9 % — ABNORMAL LOW (ref 39.0–52.0)
MCH: 31.5 pg (ref 26.0–34.0)
MCV: 90.9 fL (ref 78.0–100.0)
Platelets: 211 10*3/uL (ref 150–400)
RDW: 13.2 % (ref 11.5–15.5)
WBC: 12.5 10*3/uL — ABNORMAL HIGH (ref 4.0–10.5)

## 2013-04-10 MED ORDER — PNEUMOCOCCAL VAC POLYVALENT 25 MCG/0.5ML IJ INJ
0.5000 mL | INJECTION | INTRAMUSCULAR | Status: DC
  Filled 2013-04-10: qty 0.5

## 2013-04-10 NOTE — Progress Notes (Signed)
Clinical Social Work Department CLINICAL SOCIAL WORK PLACEMENT NOTE 04/10/2013  Patient:  Ivan Clark, Ivan Clark  Account Number:  [id number] Admit date:  04/09/2013  Clinical Social Worker:  Jetta Lout, Theresia Majors  Date/time:  04/10/2013 04:13 PM  Clinical Social Work is seeking post-discharge placement for this patient at the following level of care:   SKILLED NURSING   (*CSW will update this form in Epic as items are completed)   04/10/2013  Patient/family provided with [facility] Department of Clinical Social Work's list of facilities offering this level of care within the geographic area requested by the patient (or if unable, by the patient's family).  04/10/2013  Patient/family informed of their freedom to choose among providers that offer the needed level of care, that participate in Medicare, Medicaid or managed care program needed by the patient, have an available bed and are willing to accept the patient.  04/10/2013  Patient/family informed of [facility]' ownership interest in [facility], as well as of the fact that they are under no obligation to receive care at this facility.  PASARR submitted to EDS on 04/10/2013 PASARR number received from EDS on 04/10/2013  FL2 transmitted to all facilities in geographic area requested by pt/family on  04/10/2013 FL2 transmitted to all facilities within larger geographic area on   Patient informed that his/her managed care company has contracts with or will negotiate with  certain facilities, including the following:     Patient/family informed of bed offers received:   Patient chooses bed at  Physician recommends and patient chooses bed at    Patient to be transferred to  on   Patient to be transferred to facility by   The following physician request were entered in Epic:   Additional Comments:

## 2013-04-10 NOTE — Evaluation (Signed)
Physical Therapy Evaluation Patient Details Name: Ivan Clark MRN: [id number] DOB: [birth year] Today's Date: 04/10/2013 Time: 4098-1191 PT Time Calculation (min): 24 min  PT Assessment / Plan / Recommendation History of Present Illness  Patient is an 79 year old male with history of hypertension, hyperlipidemia, autonomic dysfunction from Parkinson's disease, orthostatic hypotension accident he lost his balance this morning in the kitchen. He has history of frequent falls. He landed on his right hip and was unable to get up or walk due to right hip pain. Patient only has pain when he moves his right hip. X-ray of his right hip showed a displaced right femoral subcapital fracture. Orthopedics was consulted in the ER and he scheduled to have surgery today. Now s/p R hip hemiarthroplasty  Clinical Impression  Patient is s/p above surgery resulting in functional limitations due to the deficits listed below (see PT Problem List).  Patient will benefit from skilled PT to increase their independence and safety with mobility to allow discharge to the venue listed below.       PT Assessment  Patient needs continued PT services    Follow Up Recommendations  CIR    Does the patient have the potential to tolerate intense rehabilitation      Barriers to Discharge        Equipment Recommendations  Rolling walker with 5" wheels;3in1 (PT)    Recommendations for Other Services Rehab consult   Frequency Min 5X/week    Precautions / Restrictions Precautions Precautions: Posterior Hip;Fall Precaution Booklet Issued: Yes (comment) Precaution Comments: Precaution sheet posted in room Restrictions Weight Bearing Restrictions: Yes RLE Weight Bearing: Weight bearing as tolerated   Pertinent Vitals/Pain 2-3/10 R hip pain; RN notified; patient repositioned for comfort       Mobility  Bed Mobility Bed Mobility: Supine to Sit;Sitting - Scoot to Edge of Bed Supine to Sit: 2: Max  assist Sitting - Scoot to [place] of Bed: 3: Mod assist Details for Bed Mobility Assistance: Cues for technique; physiscal assist to elevat trunk form bed; closeguard for hip prec Transfers Transfers: Stand to Sit;Sit to Stand Sit to Stand: 3: Mod assist;From bed Stand to Sit: 3: Mod assist;To chair/3-in-1;With upper extremity assist Details for Transfer Assistance: Cues for safety, technique, had placement, and for pre-positioning for post hip prec; Required heavy mod assist to control descent with stand to sit Ambulation/Gait Ambulation/Gait Assistance: 4: Min assist Ambulation Distance (Feet): 5 Feet Assistive device: Rolling walker Ambulation/Gait Assistance Details: Cues for gait sequence and posture Gait Pattern: Step-to pattern Gait velocity: slow    Exercises     PT Diagnosis: Difficulty walking;Acute pain  PT Problem List: Decreased strength;Decreased range of motion;Decreased activity tolerance;Decreased balance;Decreased mobility;Decreased coordination;Decreased knowledge of use of DME;Pain;Decreased knowledge of precautions PT Treatment Interventions: DME instruction;Gait training;Stair training;Functional mobility training;Therapeutic activities;Therapeutic exercise;Balance training;Patient/family education     PT Goals(Current goals can be found in the care plan section) Acute Rehab PT Goals Patient Stated Goal: be able to walk PT Goal Formulation: With patient Time For Goal Achievement: 04/17/13 Potential to Achieve Goals: Good  Visit Information  Last PT Received On: 04/10/13 Assistance Needed: +1 History of Present Illness: Patient is an 79 year old male with history of hypertension, hyperlipidemia, autonomic dysfunction from Parkinson's disease, orthostatic hypotension accident he lost his balance this morning in the kitchen. He has history of frequent falls. He landed on his right hip and was unable to get up or walk due to right hip pain. Patient only has pain when  he moves  his right hip. X-ray of his right hip showed a displaced right femoral subcapital fracture. Orthopedics was consulted in the ER and he scheduled to have surgery today. Now s/p R hip hemiarthroplasty       Prior Functioning  Home Living Family/patient expects to be discharged to:: Private residence Living Arrangements: Spouse/significant other Available Help at Discharge: Family;Available 24 hours/day Type of Home: House Home Access: Stairs to enter [employer] of Steps: 2 Entrance Stairs-Rails: Right Home Layout: One level Home Equipment: Other (comment) (TBD) Prior Function Level of Independence: Needs assistance ADL's / Homemaking Assistance Needed: wife helps tie shoes and performs all homemaking tasks.   Communication Communication: No difficulties    Cognition  Cognition Arousal/Alertness: Awake/alert Behavior During Therapy: WFL for tasks assessed/performed Overall Cognitive Status: Within Functional Limits for tasks assessed    Extremity/Trunk Assessment Upper Extremity Assessment Upper Extremity Assessment: Defer to OT evaluation Lower Extremity Assessment Lower Extremity Assessment: RLE deficits/detail RLE Deficits / Details: Decr AROM and strength, limited by paion postop RLE: Unable to fully assess due to pain Cervical / Trunk Assessment Cervical / Trunk Assessment: Other exceptions Cervical / Trunk Exceptions: Extremely weak c-spine extensors, with significant head and shoulders forward posture   Balance    End of Session PT - End of Session Equipment Utilized During Treatment: Gait belt Activity Tolerance: Patient tolerated treatment well Patient left: in chair;with call bell/phone within reach Nurse Communication: Mobility status;Precautions  GP     Van Clines [facility] [place], [place] [phone]  04/10/2013, 9:53 AM

## 2013-04-10 NOTE — Progress Notes (Signed)
Rehab Admissions Coordinator Note:  Patient was screened by [name] for appropriateness for an Inpatient Acute Rehab Consult.  At this time, we are recommending Inpatient Rehab consult. Please order.  [name] 04/10/2013, 11:07 AM  I can be reached at [phone].

## 2013-04-10 NOTE — Progress Notes (Signed)
Orthopedics Progress Note  Subjective: I feel better today  Objective:  Filed Vitals:   04/10/13 0610  BP: 136/58  Pulse:   Temp: 98 F (36.7 C)  Resp: 16    General: Awake and alert, comfortable, sitting up in bed Musculoskeletal: right hip dressing CDI, minimal swelling Neurovascularly intact  Lab Results  Component Value Date   WBC 8.3 04/09/2013   HGB 14.0 04/09/2013   HCT 39.8 04/09/2013   MCV 91.1 04/09/2013   PLT 222 04/09/2013       Component Value Date/Time   NA 140 04/09/2013 0944   K 3.8 04/09/2013 0944   CL 101 04/09/2013 0944   CO2 27 04/09/2013 0944   GLUCOSE 105* 04/09/2013 0944   BUN 23 04/09/2013 0944   CREATININE 0.92 04/09/2013 0944   CALCIUM 9.3 04/09/2013 0944   GFRNONAA 76* 04/09/2013 0944   GFRAA 89* 04/09/2013 0944    Lab Results  Component Value Date   INR 1.04 04/09/2013   INR 1.01 10/20/2010    Assessment/Plan: POD #1 s/p Procedure(s): ARTHROPLASTY BIPOLAR HIP Check labs DVT prophylaxis mechanical and Lovenox Pt,OT, d/c planning - WBAT on right  Viviann Spare R. [name], MD 04/10/2013 6:58 AM

## 2013-04-10 NOTE — Progress Notes (Signed)
Clinical Social Work Department BRIEF PSYCHOSOCIAL ASSESSMENT 04/10/2013  Patient:  THADEUS, GANDOLFI     Account Number:  [id number]     Admit date:  04/09/2013  Clinical Social Worker:  Hendricks Milo  Date/Time:  04/10/2013 04:04 PM  Referred by:  Physician  Date Referred:  04/09/2013 Referred for  SNF Placement   Other Referral:   Interview type:  Patient Other interview type:    PSYCHOSOCIAL DATA Living Status:  WIFE Admitted from facility:   Level of care:   Primary support name:  Harvin Konicek Primary support relationship to patient:  SPOUSE Degree of support available:   Very supportive at bedside during assessment.    CURRENT CONCERNS  Other Concerns:    SOCIAL WORK ASSESSMENT / PLAN Clinical Social Worker (CSW) met with patient, his wife Dois Davenport, his daughter Toniann Fail, and his granddaughter Fleet Contras. CSW explained the difference between CIR and SNF. Patient and wife agreeable to SNF search in [facility] as a back up to CIR. Patient's first choice of SNF is [facility] and second choice is [facility].   Assessment/plan status:  Psychosocial Support/Ongoing Assessment of Needs Other assessment/ plan:   Information/referral to community resources:   CSW gave patient SNF list.    PATIENT'S/FAMILY'S RESPONSE TO PLAN OF CARE: Patient and his family thanked CSW for explaining the difference between CIR and SNF.

## 2013-04-10 NOTE — Op Note (Signed)
NAMEMarland Kitchen  SUHAYB, ANZALONE NO.:  [id number]  MEDICAL RECORD NO.:  [id number]  LOCATION:  5N03C                        FACILITY:  [facility]  PHYSICIAN:  Almedia Balls. [name], M.D. DATE OF BIRTH:  31-Aug-1928  DATE OF PROCEDURE:  04/09/2013 DATE OF DISCHARGE:                              OPERATIVE REPORT   PREOPERATIVE DIAGNOSIS:  Displaced right femoral neck fracture.  POSTOP DIAGNOSIS:  Displaced right femoral neck fracture.  PROCEDURE PERFORMED:  Right hip hemiarthroplasty using diffuse trial lock prosthesis with monopolar head.  ATTENDING SURGEON:  Almedia Balls. [name], MD.  ASSISTANT:  Donnie Coffin. Dixon, P.A., who was scrubbed during the entire procedure and necessary for satisfactory completion of surgery.  ANESTHESIA:  General anesthesia was used.  ESTIMATED BLOOD LOSS:  100 mL.  FLUID REPLACEMENT:  1500 mL of crystalloid.  COUNTS:  Instrument counts correct.  COMPLICATIONS:  None.  ANTIBIOTICS:  Perioperative antibiotics were given.  INDICATIONS:  The patient is an 79 year old male who suffered a ground- level fall injuring his right hip.  The patient has a displaced femoral neck fracture.  I counseled the patient and family regarding the need to operatively address his femoral neck fracture with a hemiarthroplasty. The patient and family agreed.  Informed consent obtained.  DESCRIPTION OF PROCEDURE:  After adequate level of anesthesia achieved, the patient was positioned in the left lateral decubitus position with the right hip up, down leg, padded appropriately, all neurovascular structures stabilized and padded.  After sterile prep and drape of the right hip and leg, we called a time-out.  We then entered the hip using a Kocher leg en bloc incision, started at the vastus ridge and extending back along the gluteal line posteriorly along gluteus maximus fibers. Dissection through the skin with a 10 blade scalpel, dissection down through subcutaneous tissues  with Bovie, identified the tensor fascia lata and divided that along the skin incision.  The gluteus medius was retracted, short external rotators and piriformis divided off the back of the femur.  With progressive internal rotation, we identified a fresh fractured femur.  There were no other pathologic features noted.  We resected the patient's femoral neck 1 fingerbreadth above the lesser trochanter with an oscillating saw.  We then went ahead and removed the femoral head and sized it to size 51 mm ball, we removed the ligamentum of teres.  Inspected the acetabulum for loose bony debris, some was removed.  The articular cartilage in the acetabulum was in good shape. Next, we went ahead and prepared the femur with initially canal finder and Star reamer and lateralizer, and then finally a broach, only system for the Tri-Lock stem, which we broached up to a size 6 stem with appropriate anteversion.  We then trialed off that stem size 6 with a +0 neck adapter and 51 ball and had nice leg length symmetry and stability with the knee in 90 degrees flexion and max internal rotation.  We dislocated the hip, removed all trial components, thoroughly irrigated the wound, and then impacted the real size 6 Tri-Lock standard stem into position with 20 degrees of anteversion, and went ahead and selected our 0 neck adapter and 51 head ball, and impacted that in  position, reduced the hip again, pleased with leg length equality and soft tissue balancing, negative Shuck.  We then repaired the capsule with interrupted #1 Ethibond suture x2, and then we did repair of the short external rotators and piriformis back up to the gluteus medius.  We then irrigated, abducted the hip and then repaired the tensor fascia lata with interrupted #1 Vicryl and 0 Vicryl suture, followed by 0 and 2-0 layered subcutaneous closure and staples for skin.  Sterile compressive bandage was applied.  The patient was then transported  in stable condition on the recovery room stretcher having tolerated the surgery well.     Almedia Balls. [name], M.D.     SRN/MEDQ  D:  04/09/2013  T:  04/09/2013  Job:  [id number]

## 2013-04-10 NOTE — Progress Notes (Signed)
[facility] PROGRESS NOTE  CHONG WOJDYLA WUJ:[id number] DOB: 06/18/1928 DOA: 04/09/2013 PCP:  Duane Lope, MD  Assessment/Plan: Right femoral fracture  - s/[ right hemiarthroplasty.  - Will place on very low dose of Dilaudid as needed  Active Problems:  Parkinsonism:  - Continue Sinemet and selegiline  Hypertension:  - better controlled.  Hyperlipidemia  - Restart the Lipitor after the surgery  BPH (benign prostatic hypertrophy)  - Continue Flomax, if needed, his urologist is Dr. Jerilee Field  Orthostatic hypotension/autonomic dysfunction  - Will continue Florinef  Dysphagia:  - Patient had an outpatient swallowing study done on 03/25/2013, once he is able to tolerate diet he should be on dysphagia 3, thin liquids.  DVT prophylaxis: SCDs for now, place on Lovenox after surgery or per orthopedic recommendations  CODE STATUS: full code  Family Communication: none atbedside.   Disposition Plan: pending PT eval.    Consultants: Orthopedics from Dr [name].  Procedures: Right hip hemiarthroplasty using diffuse trial  lock prosthesis with monopolar head.  Antibiotics: none HPI/Subjective: Pain is controlled.  Objective: Filed Vitals:   04/10/13 1600  BP:   Pulse:   Temp:   Resp: 18    Intake/Output Summary (Last 24 hours) at 04/10/13 1644 Last data filed at 04/10/13 0700  Gross per 24 hour  Intake    480 ml  Output    500 ml  Net    -20 ml   Filed Weights   04/09/13 0931  Weight: 77.111 kg (170 lb)    Exam:   General:  Alert afebrile comfortable  Cardiovascular: s1s2  Respiratory: ctab  Abdomen: soft NT ND BS+  Musculoskeletal: no pedal edema.   Data Reviewed: Basic Metabolic Panel:  Recent Labs Lab 04/09/13 0944 04/10/13 0605  NA 140 135  K 3.8 4.0  CL 101 100  CO2 27 23  GLUCOSE 105* 125*  BUN 23 24*  CREATININE 0.92 0.99  CALCIUM 9.3 8.4   Liver Function Tests: No results found for this basename: AST, ALT, ALKPHOS,  BILITOT, PROT, ALBUMIN,  in the last 168 hours No results found for this basename: LIPASE, AMYLASE,  in the last 168 hours No results found for this basename: AMMONIA,  in the last 168 hours CBC:  Recent Labs Lab 04/09/13 0944 04/10/13 0605  WBC 8.3 12.5*  NEUTROABS 5.9  --   HGB 14.0 11.4*  HCT 39.8 32.9*  MCV 91.1 90.9  PLT 222 211   Cardiac Enzymes: No results found for this basename: CKTOTAL, CKMB, CKMBINDEX, TROPONINI,  in the last 168 hours BNP (last 3 results) No results found for this basename: PROBNP,  in the last 8760 hours CBG: No results found for this basename: GLUCAP,  in the last 168 hours  No results found for this or any previous visit (from the past 240 hour(s)).   Studies: Dg Chest 1 View  04/09/2013   CLINICAL DATA:  Fall and hip pain.  EXAM: CHEST - 1 VIEW  COMPARISON:  03/01/2013  FINDINGS: The lungs are clear bilaterally. Stable appearance of the heart and mediastinum. The trachea is midline. No evidence for a pneumothorax. Bony thorax is intact.  IMPRESSION: No acute cardiopulmonary disease.   Electronically Signed   By: Richarda Overlie M.D.   On: 04/09/2013 11:28   Dg Hip Complete Right  04/09/2013   CLINICAL DATA:  Fall and right hip pain.  EXAM: RIGHT HIP - COMPLETE 2+ VIEW  COMPARISON:  None.  FINDINGS: There is a mildly displaced  right subcapital femoral fracture. The right femoral trochanteric region is mildly displaced superiorly. The right femoral head is located. The pelvic bony ring is intact. No other definite fractures.  IMPRESSION: Displaced right femoral subcapital fracture.   Electronically Signed   By: Richarda Overlie M.D.   On: 04/09/2013 11:21   Dg Pelvis Portable  04/09/2013   CLINICAL DATA:  Postop  EXAM: PORTABLE PELVIS 1-2 VIEWS  COMPARISON:  1030 hr  FINDINGS: Right hip hemiarthroplasty has been placed. Anatomic alignment of the osseous and prostatic structures. No breakage or loosening of the hardware.  IMPRESSION: Right hip hemiarthroplasty  anatomically aligned.   Electronically Signed   By: Maryclare Bean M.D.   On: 04/09/2013 17:35    Scheduled Meds: . amLODipine  5 mg Oral Daily  . aspirin EC  81 mg Oral QPC supper  . atorvastatin  20 mg Oral Daily  . B-complex with vitamin C  1 tablet Oral Daily  . carbidopa-levodopa  1 tablet Oral QHS  . carbidopa-levodopa  1 tablet Oral TID WC  . cholecalciferol  2,000 Units Oral Daily  . docusate sodium  100 mg Oral BID  . dutasteride  0.5 mg Oral QPC supper  . enoxaparin (LOVENOX) injection  40 mg Subcutaneous Q24H  . fentaNYL  50-100 mcg Intravenous Once  . ferrous sulfate  325 mg Oral TID PC  . fludrocortisone  0.1 mg Oral Daily  . irbesartan  150 mg Oral BID  . multivitamin with minerals  1 tablet Oral Daily  . selegiline  5 mg Oral BID WC  . tamsulosin  0.4 mg Oral QPC supper   Continuous Infusions: . sodium chloride 1,000 mL (04/09/13 0957)  . sodium chloride 1 mL (04/10/13 1027)  . lactated ringers 20 mL/hr at 04/09/13 1349    Principal Problem:   Right femoral fracture Active Problems:   Parkinsonism   Hypertension   Hyperlipidemia   BPH (benign prostatic hypertrophy)   Closed right hip fracture   Hip fracture, right   Femoral neck fracture    Time spent: 25 min    Taila Basinski  [facility] Pager [phone] If 7PM-7AM, please contact night-coverage at [email or web address], password [facility] 04/10/2013, 4:44 PM  LOS: 1 day

## 2013-04-11 ENCOUNTER — Telehealth: Payer: Self-pay | Admitting: *Deleted

## 2013-04-11 DIAGNOSIS — S72009A Fracture of unspecified part of neck of unspecified femur, initial encounter for closed fracture: Secondary | ICD-10-CM

## 2013-04-11 DIAGNOSIS — W19XXXA Unspecified fall, initial encounter: Secondary | ICD-10-CM

## 2013-04-11 LAB — BASIC METABOLIC PANEL
BUN: 19 mg/dL (ref 6–23)
Calcium: 8.5 mg/dL (ref 8.4–10.5)
Chloride: 99 mEq/L (ref 96–112)
Creatinine, Ser: 0.84 mg/dL (ref 0.50–1.35)
GFR calc non Af Amer: 79 mL/min — ABNORMAL LOW (ref >90)
Glucose, Bld: 164 mg/dL — ABNORMAL HIGH (ref 70–99)

## 2013-04-11 LAB — URINALYSIS, ROUTINE W REFLEX MICROSCOPIC
Bilirubin Urine: NEGATIVE
Ketones, ur: 15 mg/dL — AB
Leukocytes, UA: NEGATIVE
Protein, ur: 100 mg/dL — AB
Specific Gravity, Urine: 1.022 (ref 1.005–1.030)
Urobilinogen, UA: 1 mg/dL (ref 0.0–1.0)

## 2013-04-11 LAB — CBC
HCT: 34.6 % — ABNORMAL LOW (ref 39.0–52.0)
Hemoglobin: 12 g/dL — ABNORMAL LOW (ref 13.0–17.0)
MCH: 31.5 pg (ref 26.0–34.0)
MCHC: 34.7 g/dL (ref 30.0–36.0)
MCV: 90.8 fL (ref 78.0–100.0)

## 2013-04-11 LAB — URINE MICROSCOPIC-ADD ON

## 2013-04-11 MED ORDER — POLYETHYLENE GLYCOL 3350 17 G PO PACK
17.0000 g | PACK | Freq: Every day | ORAL | Status: DC
  Administered 2013-04-11: 17 g via ORAL
  Filled 2013-04-11 (×2): qty 1

## 2013-04-11 MED ORDER — PANTOPRAZOLE SODIUM 40 MG PO TBEC
40.0000 mg | DELAYED_RELEASE_TABLET | Freq: Every day | ORAL | Status: DC
  Administered 2013-04-12 – 2013-04-14 (×3): 40 mg via ORAL
  Filled 2013-04-11 (×3): qty 1

## 2013-04-11 MED ORDER — ENSURE COMPLETE PO LIQD
237.0000 mL | Freq: Two times a day (BID) | ORAL | Status: DC
  Administered 2013-04-12 – 2013-04-14 (×5): 237 mL via ORAL

## 2013-04-11 MED ORDER — FERROUS SULFATE 325 (65 FE) MG PO TABS: 325.0000 mg | ORAL_TABLET | Freq: Three times a day (TID) | ORAL | Status: DC

## 2013-04-11 MED ORDER — DSS 100 MG PO CAPS: 100.0000 mg | ORAL_CAPSULE | Freq: Two times a day (BID) | ORAL | Status: DC

## 2013-04-11 MED ORDER — ALUM & MAG HYDROXIDE-SIMETH 200-200-20 MG/5ML PO SUSP
15.0000 mL | ORAL | Status: DC | PRN
  Administered 2013-04-11: 15 mL via ORAL
  Filled 2013-04-11: qty 30

## 2013-04-11 NOTE — Progress Notes (Signed)
[facility] PROGRESS NOTE  HAYNES GIANNOTTI ZOX:[id number] DOB: 08/31/1928 DOA: 04/09/2013 PCP:  Duane Lope, MD  Assessment/Plan: Right femoral fracture  - s/p right hemiarthroplasty.  - Will place on very low dose of Dilaudid as needed   Parkinsonism:  - Continue Sinemet and selegiline  Hypertension:  - better controlled.  Hyperlipidemia  - Restart the Lipitor after the surgery  BPH (benign prostatic hypertrophy)  - Continue Flomax, if needed, his urologist is Dr. Jerilee Field  Orthostatic hypotension/autonomic dysfunction  - Will continue Florinef  Dysphagia:  - Patient had an outpatient swallowing study done on 03/25/2013, once he is able to tolerate diet he should be on dysphagia 3, thin liquids.  DVT prophylaxis: SCDs for now, place on Lovenox after surgery or per orthopedic recommendations  CODE STATUS: full code  Family Communication: none atbedside.  Disposition Plan: CIR   Consultants: Orthopedics from Dr [name].  Procedures: Right hip hemiarthroplasty using diffuse trial  lock prosthesis with monopolar head.  Antibiotics: none HPI/Subjective: Pain is controlled.  Objective: Filed Vitals:   04/11/13 1450  BP: 153/56  Pulse: 93  Temp: 98 F (36.7 C)  Resp: 16    Intake/Output Summary (Last 24 hours) at 04/11/13 1533 Last data filed at 04/11/13 1300  Gross per 24 hour  Intake 1959.17 ml  Output   1250 ml  Net 709.17 ml   Filed Weights   04/09/13 0931  Weight: 77.111 kg (170 lb)    Exam:   General:  Alert afebrile comfortable  Cardiovascular: s1s2  Respiratory: ctab  Abdomen: soft NT ND BS+  Musculoskeletal: no pedal edema.   Data Reviewed: Basic Metabolic Panel:  Recent Labs Lab 04/09/13 0944 04/10/13 0605 04/11/13 0855  NA 140 135 133*  K 3.8 4.0 3.6  CL 101 100 99  CO2 27 23 24   GLUCOSE 105* 125* 164*  BUN 23 24* 19  CREATININE 0.92 0.99 0.84  CALCIUM 9.3 8.4 8.5   Liver Function Tests: No results found  for this basename: AST, ALT, ALKPHOS, BILITOT, PROT, ALBUMIN,  in the last 168 hours No results found for this basename: LIPASE, AMYLASE,  in the last 168 hours No results found for this basename: AMMONIA,  in the last 168 hours CBC:  Recent Labs Lab 04/09/13 0944 04/10/13 0605 04/11/13 0855  WBC 8.3 12.5* 12.8*  NEUTROABS 5.9  --   --   HGB 14.0 11.4* 12.0*  HCT 39.8 32.9* 34.6*  MCV 91.1 90.9 90.8  PLT 222 211 181   Cardiac Enzymes: No results found for this basename: CKTOTAL, CKMB, CKMBINDEX, TROPONINI,  in the last 168 hours BNP (last 3 results) No results found for this basename: PROBNP,  in the last 8760 hours CBG: No results found for this basename: GLUCAP,  in the last 168 hours  No results found for this or any previous visit (from the past 240 hour(s)).   Studies: Dg Pelvis Portable  04/09/2013   CLINICAL DATA:  Postop  EXAM: PORTABLE PELVIS 1-2 VIEWS  COMPARISON:  1030 hr  FINDINGS: Right hip hemiarthroplasty has been placed. Anatomic alignment of the osseous and prostatic structures. No breakage or loosening of the hardware.  IMPRESSION: Right hip hemiarthroplasty anatomically aligned.   Electronically Signed   By: Maryclare Bean M.D.   On: 04/09/2013 17:35    Scheduled Meds: . amLODipine  5 mg Oral Daily  . aspirin EC  81 mg Oral QPC supper  . atorvastatin  20 mg Oral Daily  . B-complex  with vitamin C  1 tablet Oral Daily  . carbidopa-levodopa  1 tablet Oral QHS  . carbidopa-levodopa  1 tablet Oral TID WC  . cholecalciferol  2,000 Units Oral Daily  . docusate sodium  100 mg Oral BID  . dutasteride  0.5 mg Oral QPC supper  . enoxaparin (LOVENOX) injection  40 mg Subcutaneous Q24H  . fentaNYL  50-100 mcg Intravenous Once  . ferrous sulfate  325 mg Oral TID PC  . fludrocortisone  0.1 mg Oral Daily  . irbesartan  150 mg Oral BID  . multivitamin with minerals  1 tablet Oral Daily  . pneumococcal 23 valent vaccine  0.5 mL Intramuscular Tomorrow-1000  . selegiline  5  mg Oral BID WC  . tamsulosin  0.4 mg Oral QPC supper   Continuous Infusions: . lactated ringers 20 mL/hr at 04/09/13 1349    Principal Problem:   Right femoral fracture Active Problems:   Parkinsonism   Hypertension   Hyperlipidemia   BPH (benign prostatic hypertrophy)   Closed right hip fracture   Hip fracture, right   Femoral neck fracture    Time spent: 25 min    Ladesha Pacini  [facility] Pager [phone] If 7PM-7AM, please contact night-coverage at [email or web address], password [facility] 04/11/2013, 3:33 PM  LOS: 2 days

## 2013-04-11 NOTE — Progress Notes (Signed)
Physical Therapy Treatment Patient Details Name: Ivan Clark MRN: [id number] DOB: 07-12-1928 Today's Date: 04/11/2013 Time: 0912-0936 PT Time Calculation (min): 24 min  PT Assessment / Plan / Recommendation  History of Present Illness Patient is an 79 year old male with history of hypertension, hyperlipidemia, autonomic dysfunction from Parkinson's disease, orthostatic hypotension accident he lost his balance this morning in the kitchen. He has history of frequent falls. He landed on his right hip and was unable to get up or walk due to right hip pain. Patient only has pain when he moves his right hip. X-ray of his right hip showed a displaced right femoral subcapital fracture. Orthopedics was consulted in the ER and he scheduled to have surgery today. Now s/p R hip hemiarthroplasty   PT Comments   Pt very pleasant & willing to participate.  Pt with significant balance deficits & decreased strength.  Required increased assist for transfers today compared to previous PT session.  Pt does state pain is controlled at this time.     Follow Up Recommendations  CIR     Does the patient have the potential to tolerate intense rehabilitation     Barriers to Discharge        Equipment Recommendations  Rolling walker with 5" wheels;3in1 (PT)    Recommendations for Other Services    Frequency Min 5X/week   Progress towards PT Goals Progress towards PT goals: Progressing toward goals  Plan Current plan remains appropriate    Precautions / Restrictions Precautions Precautions: Posterior Hip;Fall Precaution Booklet Issued: Yes (comment) Precaution Comments: Reviewed posterior hip precautions.  Restrictions Weight Bearing Restrictions: Yes RLE Weight Bearing: Weight bearing as tolerated   Pertinent Vitals/Pain 1-2/10 Rt hip.      Mobility  Bed Mobility Bed Mobility: Supine to Sit;Sitting - Scoot to Edge of Bed Supine to Sit: 2: Max assist;With rails;HOB elevated Sitting - Scoot to  Edge of Bed: 3: Mod assist Details for Bed Mobility Assistance: Cues for sequencing & technique.  (A) for LE's management, use of draw pad to bring hips closer to EOB & to pivot hips.   Transfers Transfers: Sit to Stand;Stand to [employer] Transfers Sit to Stand: 2: Max assist;With upper extremity assist;From bed Stand to Sit: 2: Max assist;With upper extremity assist;With armrests;To chair/3-in-1 Stand Pivot Transfers: 2: Max assist Details for Transfer Assistance: (A) to achieve standing, balance, rotation of hips around to recliner, & controlled descent.  Pt very unsteady, poor balance as he leans posteriorly & to Lt side, & keep cervical in flexed position.    Ambulation/Gait Ambulation/Gait Assistance: Not tested (comment)    Exercises Total Joint Exercises Ankle Circles/Pumps: AROM;Both;10 reps Heel Slides: AAROM;Strengthening;Right;10 reps Hip ABduction/ADduction: AAROM;Strengthening;Right;10 reps Long Arc Quad: AROM;Strengthening;Right;10 reps     PT Goals (current goals can now be found in the care plan section) Acute Rehab PT Goals PT Goal Formulation: With patient Time For Goal Achievement: 04/17/13 Potential to Achieve Goals: Good  Visit Information  Last PT Received On: 04/11/13 Assistance Needed: +1 History of Present Illness: Patient is an 79 year old male with history of hypertension, hyperlipidemia, autonomic dysfunction from Parkinson's disease, orthostatic hypotension accident he lost his balance this morning in the kitchen. He has history of frequent falls. He landed on his right hip and was unable to get up or walk due to right hip pain. Patient only has pain when he moves his right hip. X-ray of his right hip showed a displaced right femoral subcapital fracture. Orthopedics was consulted in the ER  and he scheduled to have surgery today. Now s/p R hip hemiarthroplasty    Subjective Data      Cognition  Cognition Arousal/Alertness: Awake/alert Behavior  During Therapy: WFL for tasks assessed/performed Overall Cognitive Status: Within Functional Limits for tasks assessed Memory: Decreased recall of precautions    Balance     End of Session PT - End of Session Equipment Utilized During Treatment: Gait belt Activity Tolerance: Patient tolerated treatment well Patient left: in chair;with call bell/phone within reach;with family/visitor present Nurse Communication: Mobility status   GP     Lara Mulch 04/11/2013, 12:59 PM  Verdell Face, PTA [phone] 04/11/2013

## 2013-04-11 NOTE — Progress Notes (Signed)
I met with pt, his wife, and daughter at bedside. Discussed inpt rehab admission for his recovery. I will seek [facility] insurance approval for admission. [phone]

## 2013-04-11 NOTE — Progress Notes (Signed)
Orthopedics Progress Note  Subjective: I feel fine this morning.  Objective:  Filed Vitals:   04/11/13 0625  BP: 148/74  Pulse: 95  Temp: 98.6 F (37 C)  Resp: 18    General: Awake and alert  Musculoskeletal: right hip min swelling, no erythema, dressing spotted Neurovascularly intact  Lab Results  Component Value Date   WBC 12.5* 04/10/2013   HGB 11.4* 04/10/2013   HCT 32.9* 04/10/2013   MCV 90.9 04/10/2013   PLT 211 04/10/2013       Component Value Date/Time   NA 135 04/10/2013 0605   K 4.0 04/10/2013 0605   CL 100 04/10/2013 0605   CO2 23 04/10/2013 0605   GLUCOSE 125* 04/10/2013 0605   BUN 24* 04/10/2013 0605   CREATININE 0.99 04/10/2013 0605   CALCIUM 8.4 04/10/2013 0605   GFRNONAA 74* 04/10/2013 0605   GFRAA 86* 04/10/2013 0605    Lab Results  Component Value Date   INR 1.04 04/09/2013   INR 1.01 10/20/2010    Assessment/Plan: POD #3  s/p Procedure(s): ARTHROPLASTY BIPOLAR HIP Stable overnight, ok for d/c to SNF WBAT on the right LE Fresh Mepilex dressing every three days  Almedia Balls. [name], MD 04/11/2013 7:45 AM

## 2013-04-11 NOTE — Progress Notes (Signed)
INITIAL NUTRITION ASSESSMENT  DOCUMENTATION CODES Per approved criteria  -Not Applicable   INTERVENTION:  Downgrade diet to Dysphagia 3, thin liquids  Ensure Complete BID (350 kcals, 13 gm protein per 8 fl oz bottle RD to follow for nutrition care plan  NUTRITION DIAGNOSIS: Inadequate oral intake related to decreased appetite as evidenced by wife report  Goal: Pt to meet >/= 90% of their estimated nutrition needs   Monitor:  PO & supplemental intake, weight, labs, I/O's  Reason for Assessment: MAOI, poor PO intake  79 y.o. male  Admitting Dx: Right femoral fracture  ASSESSMENT: Patient with PMH of HTN, hyperlipidemia and Parkinson's disease; presented after falling in his kitchen landing on his R hip; X-ray showed a displaced right femoral subcapital fracture.   Patient s/p procedure 11/29: RIGHT HIP HEMIARTHROPLASTY  Patient known to this RD with previous hospitalization; patient's wife reports patient is eating a little better not not much; he still is having trouble with meats (chewing); PO intake at 50% per flowsheet records; he was agreeable to chocolate Ensure Complete supplements -- RD to order.  Noted patient is on MAOI selegiline (ELDEPRYL).  Current hospital diet is low in [place]ramine.  Please consult RD for education as/if needed.  Height: Ht Readings from Last 1 Encounters:  04/09/13 5\' 6"  (1.676 m)    Weight: Wt Readings from Last 1 Encounters:  04/09/13 170 lb (77.111 kg)    Ideal Body Weight: 142 lb  % Ideal Body Weight: 120%  Wt Readings from Last 10 Encounters:  04/09/13 170 lb (77.111 kg)  04/09/13 170 lb (77.111 kg)  03/08/13 168 lb (76.204 kg)  03/03/13 172 lb 6.4 oz (78.2 kg)  02/16/13 176 lb (79.833 kg)  11/10/12 180 lb (81.647 kg)  07/28/12 185 lb (83.915 kg)  06/28/12 189 lb (85.73 kg)  11/20/11 184 lb (83.462 kg)  11/20/11 184 lb (83.462 kg)    Usual Body Weight: 168 lb  % Usual Body Weight: 101%  BMI:  Body mass index is  27.45 kg/(m^2).  Estimated Nutritional Needs: Kcal: 1700-1900 Protein: 80-90 gm Fluid: 1.7-1.9 L  Skin: hip surgical incision   Diet Order: General  EDUCATION NEEDS: -No education needs identified at this time   Intake/Output Summary (Last 24 hours) at 04/11/13 1156 Last data filed at 04/11/13 0946  Gross per 24 hour  Intake 1719.17 ml  Output   1050 ml  Net 669.17 ml    Labs:   Recent Labs Lab 04/09/13 0944 04/10/13 0605 04/11/13 0855  NA 140 135 133*  K 3.8 4.0 3.6  CL 101 100 99  CO2 27 23 24   BUN 23 24* 19  CREATININE 0.92 0.99 0.84  CALCIUM 9.3 8.4 8.5  GLUCOSE 105* 125* 164*    Scheduled Meds: . amLODipine  5 mg Oral Daily  . aspirin EC  81 mg Oral QPC supper  . atorvastatin  20 mg Oral Daily  . B-complex with vitamin C  1 tablet Oral Daily  . carbidopa-levodopa  1 tablet Oral QHS  . carbidopa-levodopa  1 tablet Oral TID WC  . cholecalciferol  2,000 Units Oral Daily  . docusate sodium  100 mg Oral BID  . dutasteride  0.5 mg Oral QPC supper  . enoxaparin (LOVENOX) injection  40 mg Subcutaneous Q24H  . fentaNYL  50-100 mcg Intravenous Once  . ferrous sulfate  325 mg Oral TID PC  . fludrocortisone  0.1 mg Oral Daily  . irbesartan  150 mg Oral BID  .  multivitamin with minerals  1 tablet Oral Daily  . pneumococcal 23 valent vaccine  0.5 mL Intramuscular Tomorrow-1000  . selegiline  5 mg Oral BID WC  . tamsulosin  0.4 mg Oral QPC supper    Continuous Infusions: . sodium chloride 50 mL/hr at 04/11/13 0657  . lactated ringers 20 mL/hr at 04/09/13 1349    Past Medical History  Diagnosis Date  . Hypertension   . Hyperlipidemia   . Aneurysm of infrarenal abdominal aorta SUPRAILIAC  4CM PER CT 10-20-2010--  STABLE  . Bladder cancer RECURRENT  (FIRST DX  2001)    HX TURBT AND CHEMO BLADDER INSTILLATION TX'S  . Parkinson disease   . Frequency of urination   . Nocturia   . Orthostatic hypotension   . Peripheral neuropathy     Past Surgical  History  Procedure Laterality Date  . Tonsillectomy and adenoidectomy  1938  . Left inguinal hernia repair  1996  . Transurethral resection of bladder tumor  09-11-1999    BLADDER CANCER  . Cysto/ retrograde pyelogram/ bladder bx's  07-03-2004  . Cysto/ bladder bx/ fulgeration bladder tumor  10-09-2004  . Appendectomy  10-20-2010  . Cystoscopy with biopsy  11/28/2011    Procedure: CYSTOSCOPY WITH BIOPSY;  Surgeon: Antony Haste, MD;  Location: [facility];  Service: Urology;  Laterality: N/A;  . Vasectomy      Maureen Chatters, RD, LDN Pager #: [phone] After-Hours Pager #: [phone]

## 2013-04-11 NOTE — Consult Note (Signed)
Physical Medicine and Rehabilitation Consult Reason for Consult: Displaced right femoral neck fracture Referring Physician: [facility]   HPI: Ivan Clark is a 79 y.o. right-handed male with history of orthostatic hypotension as well as autonomic dysfunction from Parkinson's disease. Patient independent prior to admission at times using a walker. Admitted 04/09/2013 after a fall when he lost his balance landing on his right hip without loss of consciousness. X-rays and imaging revealed a displaced right femoral subcapital fracture. Underwent right hip hemiarthroplasty 04/09/2013 per Dr. [name]. Weightbearing as tolerated with posterior hip precautions. Postoperative pain management. Subcutaneous Lovenox for DVT prophylaxis. Physical therapy evaluation completed 04/10/2013 with recommendations for physical medicine rehabilitation consult to consider inpatient rehabilitation services.   Review of Systems  Gastrointestinal: Positive for constipation.  Genitourinary: Positive for dysuria.       Nocturia  Neurological: Positive for dizziness.  All other systems reviewed and are negative.   Past Medical History  Diagnosis Date  . Hypertension   . Hyperlipidemia   . Aneurysm of infrarenal abdominal aorta SUPRAILIAC  4CM PER CT 10-20-2010--  STABLE  . Bladder cancer RECURRENT  (FIRST DX  2001)    HX TURBT AND CHEMO BLADDER INSTILLATION TX'S  . Parkinson disease   . Frequency of urination   . Nocturia   . Orthostatic hypotension   . Peripheral neuropathy    Past Surgical History  Procedure Laterality Date  . Tonsillectomy and adenoidectomy  1938  . Left inguinal hernia repair  1996  . Transurethral resection of bladder tumor  09-11-1999    BLADDER CANCER  . Cysto/ retrograde pyelogram/ bladder bx's  07-03-2004  . Cysto/ bladder bx/ fulgeration bladder tumor  10-09-2004  . Appendectomy  10-20-2010  . Cystoscopy with biopsy  11/28/2011    Procedure: CYSTOSCOPY WITH BIOPSY;  Surgeon:  Antony Haste, MD;  Location: [facility];  Service: Urology;  Laterality: N/A;  . Vasectomy     Family History  Problem Relation Age of Onset  . Heart failure Father   . Alzheimer's disease Mother    Social History:  reports that he quit smoking about 13 years ago. His smoking use included Cigarettes. He has a 100 pack-year smoking history. He has never used smokeless tobacco. He reports that he does not drink alcohol or use illicit drugs. Allergies: No Known Allergies Medications Prior to Admission  Medication Sig Dispense Refill  . amLODipine (NORVASC) 5 MG tablet Take 5 mg by mouth daily.      [name] aspirin EC 81 MG tablet Take 81 mg by mouth daily after supper.       [name] atorvastatin (LIPITOR) 20 MG tablet Take 20 mg by mouth daily.      [name] b complex vitamins tablet Take 1 tablet by mouth daily.      . carbidopa-levodopa (SINEMET CR) 50-200 MG per tablet Take 1 tablet by mouth at bedtime.      . carbidopa-levodopa (SINEMET IR) 25-100 MG per tablet Take 1 tablet by mouth 3 (three) times daily with meals.       . Cholecalciferol (VITAMIN D) 2000 UNITS CAPS Take 2,000 Units by mouth daily.      [name] dutasteride (AVODART) 0.5 MG capsule Take 0.5 mg by mouth daily after supper.       . fludrocortisone (FLORINEF) 0.1 MG tablet Take 0.1 mg by mouth daily.      . irbesartan (AVAPRO) 150 MG tablet Take 150 mg by mouth 2 (two) times daily.       [name]  Multiple Vitamin (MULTIVITAMIN WITH MINERALS) TABS Take 1 tablet by mouth daily.      . selegiline (ELDEPRYL) 5 MG capsule Take 5 mg by mouth 2 (two) times daily. One in the morning and one at noon.      . tamsulosin (FLOMAX) 0.4 MG CAPS capsule Take 0.4 mg by mouth daily after supper.        Home: Home Living Family/patient expects to be discharged to:: Private residence Living Arrangements: Spouse/significant other Available Help at Discharge: Family;Available 24 hours/day Type of Home: House Home Access: Stairs to  enter [employer] of Steps: 2 Entrance Stairs-Rails: Right Home Layout: One level Home Equipment: Other (comment) (TBD)  Functional History:   Functional Status:  Mobility: Bed Mobility Bed Mobility: Supine to Sit;Sitting - Scoot to Edge of Bed Supine to Sit: 2: Max assist Sitting - Scoot to [employer] of Bed: 3: Mod assist Transfers Transfers: Stand to Sit;Sit to Stand Sit to Stand: 3: Mod assist;From bed Stand to Sit: 3: Mod assist;To chair/3-in-1;With upper extremity assist Ambulation/Gait Ambulation/Gait Assistance: 4: Min assist Ambulation Distance (Feet): 5 Feet Assistive device: Rolling walker Ambulation/Gait Assistance Details: Cues for gait sequence and posture Gait Pattern: Step-to pattern Gait velocity: slow    ADL:    Cognition: Cognition Overall Cognitive Status: Within Functional Limits for tasks assessed Orientation Level: Oriented X4 Cognition Arousal/Alertness: Awake/alert Behavior During Therapy: WFL for tasks assessed/performed Overall Cognitive Status: Within Functional Limits for tasks assessed  Blood pressure 148/74, pulse 95, temperature 98.6 F (37 C), temperature source Oral, resp. rate 18, height 5\' 6"  (1.676 m), weight 77.111 kg (170 lb), SpO2 98.00%. Physical Exam  Vitals reviewed. Constitutional: He is oriented to person, place, and time. He appears well-developed and well-nourished.  Eyes: EOM are normal.  Neck: Normal range of motion. Neck supple. No thyromegaly present.  Cardiovascular: Normal rate and regular rhythm.   Respiratory: Effort normal and breath sounds normal. No respiratory distress.  GI: Soft. Bowel sounds are normal. He exhibits no distension.  Neurological: He is alert and oriented to person, place, and time.  Follows full commands. Mild tremor. Head forward posture. No rigidity, minimal cogwheeling.  Right hip tender and affects motor testing. LLE is 3/5 prox to 4/5 distally. RLE 1/5 to 3/5 distally. UE's are 4- to  4/5. No gross sensory changes  Skin:  Hip incision is dressed and appropriately tender  Psychiatric:  Mood is flat but appropriate    Results for orders placed during the hospital encounter of 04/09/13 (from the past 24 hour(s))  CBC     Status: Abnormal   Collection Time    04/11/13  8:55 AM      Result Value Range   WBC 12.8 (*) 4.0 - 10.5 K/uL   RBC 3.81 (*) 4.22 - 5.81 MIL/uL   Hemoglobin 12.0 (*) 13.0 - 17.0 g/dL   HCT 40.9 (*) 81.1 - 91.4 %   MCV 90.8  78.0 - 100.0 fL   MCH 31.5  26.0 - 34.0 pg   MCHC 34.7  30.0 - 36.0 g/dL   RDW 78.2  95.6 - 21.3 %   Platelets 181  150 - 400 K/uL  BASIC METABOLIC PANEL     Status: Abnormal   Collection Time    04/11/13  8:55 AM      Result Value Range   Sodium 133 (*) 135 - 145 mEq/L   Potassium 3.6  3.5 - 5.1 mEq/L   Chloride 99  96 - 112 mEq/L  CO2 24  19 - 32 mEq/L   Glucose, Bld 164 (*) 70 - 99 mg/dL   BUN 19  6 - 23 mg/dL   Creatinine, Ser 1.61  0.50 - 1.35 mg/dL   Calcium 8.5  8.4 - 09.6 mg/dL   GFR calc non Af Amer 79 (*) >90 mL/min   GFR calc Af Amer >90  >90 mL/min   Dg Chest 1 View  04/09/2013   CLINICAL DATA:  Fall and hip pain.  EXAM: CHEST - 1 VIEW  COMPARISON:  03/01/2013  FINDINGS: The lungs are clear bilaterally. Stable appearance of the heart and mediastinum. The trachea is midline. No evidence for a pneumothorax. Bony thorax is intact.  IMPRESSION: No acute cardiopulmonary disease.   Electronically Signed   By: Richarda Overlie M.D.   On: 04/09/2013 11:28   Dg Hip Complete Right  04/09/2013   CLINICAL DATA:  Fall and right hip pain.  EXAM: RIGHT HIP - COMPLETE 2+ VIEW  COMPARISON:  None.  FINDINGS: There is a mildly displaced right subcapital femoral fracture. The right femoral trochanteric region is mildly displaced superiorly. The right femoral head is located. The pelvic bony ring is intact. No other definite fractures.  IMPRESSION: Displaced right femoral subcapital fracture.   Electronically Signed   By: Richarda Overlie  M.D.   On: 04/09/2013 11:21   Dg Pelvis Portable  04/09/2013   CLINICAL DATA:  Postop  EXAM: PORTABLE PELVIS 1-2 VIEWS  COMPARISON:  1030 hr  FINDINGS: Right hip hemiarthroplasty has been placed. Anatomic alignment of the osseous and prostatic structures. No breakage or loosening of the hardware.  IMPRESSION: Right hip hemiarthroplasty anatomically aligned.   Electronically Signed   By: Maryclare Bean M.D.   On: 04/09/2013 17:35    Assessment/Plan: Diagnosis: Right femoral neck fracture after fall, hx of PD 1. Does the need for close, 24 hr/day medical supervision in concert with the patient's rehab needs make it unreasonable for this patient to be served in a less intensive setting? Yes 2. Co-Morbidities requiring supervision/potential complications: htn, orthopnea, cervical dystonia 3. Due to bladder management, bowel management, safety and disease management, does the patient require 24 hr/day rehab nursing? Yes 4. Does the patient require coordinated care of a physician, rehab nurse, PT (1-2 hrs/day, 5 days/week) and OT (1-2 hrs/day, 5 days/week) to address physical and functional deficits in the context of the above medical diagnosis(es)? Yes Addressing deficits in the following areas: balance, endurance, locomotion, strength, transferring, bowel/bladder control, bathing, dressing, feeding, grooming, toileting and psychosocial support 5. Can the patient actively participate in an intensive therapy program of at least 3 hrs of therapy per day at least 5 days per week? Yes 6. The potential for patient to make measurable gains while on inpatient rehab is excellent 7. Anticipated functional outcomes upon discharge from inpatient rehab are supervision to min assist with PT, min assist with OT, mod I with SLP. 8. Estimated rehab length of stay to reach the above functional goals is: 14-18 days 9. Does the patient have adequate social supports to accommodate these discharge functional goals?  Yes 10. Anticipated D/C setting: Home 11. Anticipated post D/C treatments: HH therapy 12. Overall Rehab/Functional Prognosis: excellent  RECOMMENDATIONS: This patient's condition is appropriate for continued rehabilitative care in the following setting: CIR Patient has agreed to participate in recommended program. Yes Note that insurance prior authorization may be required for reimbursement for recommended care.  Comment: Rehab RN to follow up.   Ranelle Oyster,  MD, [place] [facility]     04/11/2013

## 2013-04-11 NOTE — Progress Notes (Signed)
RN unable to give PNA vaccine this am due to fridgerator being out of order. 2 other RN's also unable to open the fridgerator.

## 2013-04-11 NOTE — Progress Notes (Signed)
04/11/13 PT recommended inpatient rehab. Inpatient rehab to evaluate. Referral to CSW for SNF as backup plan. CM will continue to follow for d/c needs. Jacquelynn Cree RN, BSN, CCM

## 2013-04-11 NOTE — Evaluation (Signed)
Occupational Therapy Evaluation Patient Details Name: Ivan Clark MRN: [id number] DOB: Jul 17, 1928 Today's Date: 04/11/2013 Time: 6295-2841 OT Time Calculation (min): 40 min  OT Assessment / Plan / Recommendation History of present illness Patient is an 79 year old male with history of hypertension, hyperlipidemia, autonomic dysfunction from Parkinson's disease, orthostatic hypotension accident he lost his balance this morning in the kitchen. He has history of frequent falls. He landed on his right hip and was unable to get up or walk due to right hip pain. Patient only has pain when he moves his right hip. X-ray of his right hip showed a displaced right femoral subcapital fracture. Orthopedics was consulted in the ER and he scheduled to have surgery today. Now s/p R hip hemiarthroplasty   Clinical Impression   This 79 yo male admitted with above presents to acute OT with decreased AROM RLE, decreased memory for hip precautions, parkinsons, decreased neck strength and swallowing due to botox injections all affecting pt's PLOF of only needing A to tie his shoes and IADLs; otherwise for BADLs he as Independent. Will benefit from continued OT on acute with follow up on CIR.    OT Assessment  Patient needs continued OT Services    Follow Up Recommendations  CIR       Equipment Recommendations  3 in 1 bedside comode       Frequency  Min 2X/week    Precautions / Restrictions Precautions Precautions: Posterior Hip;Fall Precaution Booklet Issued: Yes (comment) Precaution Comments: Precaution sheet posted in room Restrictions Weight Bearing Restrictions: Yes RLE Weight Bearing: Weight bearing as tolerated       ADL  Eating/Feeding: Set up Where Assessed - Eating/Feeding: Chair Grooming: Set up;Supervision/safety Where Assessed - Grooming: Supported sitting Upper Body Bathing: Set up;Supervision/safety Where Assessed - Upper Body Bathing: Supported sitting Lower Body Bathing:  Maximal assistance Where Assessed - Lower Body Bathing: Supported sitting Upper Body Dressing: Moderate assistance Where Assessed - Upper Body Dressing: Supported sitting Lower Body Dressing: Maximal assistance (with AE) Where Assessed - Lower Body Dressing: Unsupported sitting Equipment Used: Reacher;Sock aid ADL Comments: Introduced AE and had pt practice doffing/donning socks and donning/doffing underwear. Made pt and family aware where they could get AE.    OT Diagnosis: Generalized weakness  OT Problem List: Decreased strength;Decreased range of motion;Impaired balance (sitting and/or standing);Decreased knowledge of precautions OT Treatment Interventions: Self-care/ADL training;Balance training;Therapeutic activities;DME and/or AE instruction;Patient/family education   OT Goals(Current goals can be found in the care plan section) Acute Rehab OT Goals OT Goal Formulation: With patient Time For Goal Achievement: 04/25/13 Potential to Achieve Goals: Good  Visit Information  Last OT Received On: 04/11/13 Assistance Needed: +1 History of Present Illness: Patient is an 79 year old male with history of hypertension, hyperlipidemia, autonomic dysfunction from Parkinson's disease, orthostatic hypotension accident he lost his balance this morning in the kitchen. He has history of frequent falls. He landed on his right hip and was unable to get up or walk due to right hip pain. Patient only has pain when he moves his right hip. X-ray of his right hip showed a displaced right femoral subcapital fracture. Orthopedics was consulted in the ER and he scheduled to have surgery today. Now s/p R hip hemiarthroplasty       Prior Functioning     Home Living Family/patient expects to be discharged to:: Private residence Living Arrangements: Spouse/significant other Available Help at Discharge: Family;Available 24 hours/day Type of Home: House Home Access: Stairs to enter [employer] of  Steps:  5 Entrance Stairs-Rails: Right Home Layout: One level Home Equipment: Walker - 2 wheels Prior Function Level of Independence: Needs assistance ADL's / Homemaking Assistance Needed: wife helps tie shoes and performs all homemaking tasks.   Communication Communication: No difficulties Dominant Hand: Right         Vision/Perception Vision - History Baseline Vision: Wears glasses all the time Patient Visual Report: No change from baseline   Cognition  Cognition Arousal/Alertness: Awake/alert Behavior During Therapy: WFL for tasks assessed/performed Overall Cognitive Status: Within Functional Limits for tasks assessed Memory: Decreased recall of precautions    Extremity/Trunk Assessment Upper Extremity Assessment Upper Extremity Assessment: Overall WFL for tasks assessed              End of Session OT - End of Session Equipment Utilized During Treatment:  (AE) Activity Tolerance: Patient tolerated treatment well Patient left: in chair;with chair alarm set;with family/visitor present       Evette Georges [phone] 04/11/2013, 12:14 PM

## 2013-04-12 ENCOUNTER — Encounter (HOSPITAL_COMMUNITY): Payer: Self-pay | Admitting: Orthopedic Surgery

## 2013-04-12 ENCOUNTER — Ambulatory Visit: Payer: Medicare Other | Admitting: Neurology

## 2013-04-12 LAB — CBC
HCT: 30.8 % — ABNORMAL LOW (ref 39.0–52.0)
MCV: 91.7 fL (ref 78.0–100.0)
Platelets: 201 10*3/uL (ref 150–400)
RDW: 13.1 % (ref 11.5–15.5)
WBC: 11.6 10*3/uL — ABNORMAL HIGH (ref 4.0–10.5)

## 2013-04-12 MED ORDER — PANTOPRAZOLE SODIUM 40 MG PO TBEC: 40.0000 mg | DELAYED_RELEASE_TABLET | Freq: Every day | ORAL | Status: DC

## 2013-04-12 MED ORDER — PNEUMOCOCCAL VAC POLYVALENT 25 MCG/0.5ML IJ INJ
0.5000 mL | INJECTION | INTRAMUSCULAR | Status: DC
  Filled 2013-04-12: qty 0.5

## 2013-04-12 MED ORDER — POLYETHYLENE GLYCOL 3350 17 G PO PACK
17.0000 g | PACK | Freq: Two times a day (BID) | ORAL | Status: DC
  Administered 2013-04-12 – 2013-04-13 (×3): 17 g via ORAL
  Filled 2013-04-12 (×8): qty 1

## 2013-04-12 MED ORDER — ENSURE COMPLETE PO LIQD: 237.0000 mL | Freq: Two times a day (BID) | ORAL | Status: DC

## 2013-04-12 MED ORDER — AMLODIPINE BESYLATE 10 MG PO TABS
10.0000 mg | ORAL_TABLET | Freq: Every day | ORAL | Status: DC
  Administered 2013-04-12 – 2013-04-14 (×3): 10 mg via ORAL
  Filled 2013-04-12 (×3): qty 1

## 2013-04-12 MED ORDER — POLYETHYLENE GLYCOL 3350 17 G PO PACK: 17.0000 g | PACK | Freq: Every day | ORAL | Status: DC | PRN

## 2013-04-12 NOTE — Discharge Summary (Signed)
Physician Discharge Summary  Ivan Clark JYN:[id number] DOB: 02/22/1929 DOA: 04/09/2013  PCP:  Duane Lope, MD  Admit date: 04/09/2013 Discharge date: 04/12/2013  Time spent: 28 minutes  Recommendations for Outpatient Follow-up:  1. Follow up with PCP in one week 2. Follow up with ORTHO as recommended.   Discharge Diagnoses:  Principal Problem:   Right femoral fracture Active Problems:   Parkinsonism   Hypertension   Hyperlipidemia   BPH (benign prostatic hypertrophy)   Closed right hip fracture   Hip fracture, right   Femoral neck fracture   Discharge Condition: improved  Diet recommendation: low sodium diet  Filed Weights   04/09/13 0931  Weight: 77.111 kg (170 lb)    History of present illness:  Patient is an 79 year old male with history of hypertension, hyperlipidemia, autonomic dysfunction from Parkinson's disease, orthostatic hypotension accident he lost his balance this morning in the kitchen. He has history of frequent falls. He landed on his right hip and was unable to get up or walk due to right hip pain. Patient only has pain when he moves his right hip. X-ray of his right hip showed a displaced right femoral subcapital fracture. Orthopedics was consulted in the ER and he scheduled to have surgery .    Hospital Course:  Right femoral fracture  - s/p right hemiarthroplasty.  - pain control and PT evaluated and recommended CIR.  - outpatient follow up with orthopedics.  Parkinsonism:  - Continue Sinemet and selegiline  Hypertension:  - better controlled.  Hyperlipidemia  - continue with lipitor on discharge.  BPH (benign prostatic hypertrophy)  - Continue Flomax, if needed, his urologist is Dr. Jerilee Field  Orthostatic hypotension/autonomic dysfunction  - Will continue Florinef  Dysphagia:  - Patient had an outpatient swallowing study done on 03/25/2013,  he should be on dysphagia 3, thin liquids.    Procedures:   Right hip  hemiarthroplasty using diffuse trial  lock prosthesis with monopolar head.  Consultations:  Orthopedics from Dr [name]  Discharge Exam: Ceasar Mons Vitals:   04/12/13 1339  BP: 148/53  Pulse: 87  Temp: 97.3 F (36.3 C)  Resp: 18    General: alert afebrile comfortable Cardiovascular: s1s2 Respiratory: ctab  Discharge Instructions      Discharge Orders   Future Appointments Provider Department Dept Phone   05/19/2013 1:15 PM Amy Aleatha Borer, PT [facility] [phone]   Future Orders Complete By Expires   Weight bearing as tolerated  As directed    Questions:     Laterality:  right   Extremity:  Lower       Medication List         amLODipine 5 MG tablet  Commonly known as:  NORVASC  Take 5 mg by mouth daily.     aspirin EC 81 MG tablet  Take 81 mg by mouth daily after supper.     atorvastatin 20 MG tablet  Commonly known as:  LIPITOR  Take 20 mg by mouth daily.     b complex vitamins tablet  Take 1 tablet by mouth daily.     carbidopa-levodopa 25-100 MG per tablet  Commonly known as:  SINEMET IR  Take 1 tablet by mouth 3 (three) times daily with meals.     carbidopa-levodopa 50-200 MG per tablet  Commonly known as:  SINEMET CR  Take 1 tablet by mouth at bedtime.     DSS 100 MG Caps  Take 100 mg by mouth 2 (two) times daily.  dutasteride 0.5 MG capsule  Commonly known as:  AVODART  Take 0.5 mg by mouth daily after supper.     enoxaparin 40 MG/0.4ML injection  Commonly known as:  LOVENOX  Inject 0.4 mLs (40 mg total) into the skin daily. 30 days post op     feeding supplement (ENSURE COMPLETE) Liqd  Take 237 mLs by mouth 2 (two) times daily between meals.     ferrous sulfate 325 (65 FE) MG tablet  Take 1 tablet (325 mg total) by mouth 3 (three) times daily after meals.     fludrocortisone 0.1 MG tablet  Commonly known as:  FLORINEF  Take 0.1 mg by mouth daily.     HYDROcodone-acetaminophen 5-325 MG per  tablet  Commonly known as:  NORCO  Take 1 tablet by mouth every 6 (six) hours as needed for moderate pain.     irbesartan 150 MG tablet  Commonly known as:  AVAPRO  Take 150 mg by mouth 2 (two) times daily.     multivitamin with minerals Tabs tablet  Take 1 tablet by mouth daily.     pantoprazole 40 MG tablet  Commonly known as:  PROTONIX  Take 1 tablet (40 mg total) by mouth daily at 6 (six) AM.     polyethylene glycol packet  Commonly known as:  MIRALAX / GLYCOLAX  Take 17 g by mouth daily as needed.     selegiline 5 MG capsule  Commonly known as:  ELDEPRYL  Take 5 mg by mouth 2 (two) times daily. One in the morning and one at noon.     tamsulosin 0.4 MG Caps capsule  Commonly known as:  FLOMAX  Take 0.4 mg by mouth daily after supper.     Vitamin D 2000 UNITS Caps  Take 2,000 Units by mouth daily.       No Known Allergies Follow-up Information   Follow up with NORRIS,STEVEN R, MD. Schedule an appointment as soon as possible for a visit in 2 weeks. [phone])    Specialty:  Orthopedic Surgery   Contact information:   [address] [place] [address] [phone]       Follow up with  Duane Lope, MD. Schedule an appointment as soon as possible for a visit in 1 week.   Specialty:  Family Medicine   Contact information:   [address] [place] [address] [phone]        The results of significant diagnostics from this hospitalization (including imaging, microbiology, ancillary and laboratory) are listed below for reference.    Significant Diagnostic Studies: Dg Chest 1 View  04/09/2013   CLINICAL DATA:  Fall and hip pain.  EXAM: CHEST - 1 VIEW  COMPARISON:  03/01/2013  FINDINGS: The lungs are clear bilaterally. Stable appearance of the heart and mediastinum. The trachea is midline. No evidence for a pneumothorax. Bony thorax is intact.  IMPRESSION: No acute cardiopulmonary disease.   Electronically Signed   By: Richarda Overlie M.D.    On: 04/09/2013 11:28   Dg Hip Complete Right  04/09/2013   CLINICAL DATA:  Fall and right hip pain.  EXAM: RIGHT HIP - COMPLETE 2+ VIEW  COMPARISON:  None.  FINDINGS: There is a mildly displaced right subcapital femoral fracture. The right femoral trochanteric region is mildly displaced superiorly. The right femoral head is located. The pelvic bony ring is intact. No other definite fractures.  IMPRESSION: Displaced right femoral subcapital fracture.   Electronically Signed   By: Madelaine Bhat  Lowella Dandy M.D.   On: 04/09/2013 11:21   Dg Pelvis Portable  04/09/2013   CLINICAL DATA:  Postop  EXAM: PORTABLE PELVIS 1-2 VIEWS  COMPARISON:  1030 hr  FINDINGS: Right hip hemiarthroplasty has been placed. Anatomic alignment of the osseous and prostatic structures. No breakage or loosening of the hardware.  IMPRESSION: Right hip hemiarthroplasty anatomically aligned.   Electronically Signed   By: Maryclare Bean M.D.   On: 04/09/2013 17:35   Dg Swallowing Func-speech Pathology  03/25/2013   Riley Nearing Deblois, CCC-SLP     03/25/2013  1:45 PM Objective Swallowing Evaluation: Modified Barium Swallowing Study   Patient Details  Name: BUNYAN BRIER MRN: [id number] Date of Birth: May 03, 1929  Today's Date: 03/25/2013 Time: 1140-1205 SLP Time Calculation (min): 25 min  Past Medical History:  Past Medical History  Diagnosis Date  . Hypertension   . Hyperlipidemia   . Aneurysm of infrarenal abdominal aorta SUPRAILIAC  4CM PER CT  10-20-2010--  STABLE  . Bladder cancer RECURRENT  (FIRST DX  2001)    HX TURBT AND CHEMO BLADDER INSTILLATION TX'S  . Parkinson disease   . Frequency of urination   . Nocturia   . Orthostatic hypotension    Past Surgical History:  Past Surgical History  Procedure Laterality Date  . Tonsillectomy and adenoidectomy  1938  . Left inguinal hernia repair  1996  . Transurethral resection of bladder tumor  09-11-1999    BLADDER CANCER  . Cysto/ retrograde pyelogram/ bladder bx's  07-03-2004  . Cysto/ bladder bx/  fulgeration bladder tumor  10-09-2004  . Appendectomy  10-20-2010  . Cystoscopy with biopsy  11/28/2011    Procedure: CYSTOSCOPY WITH BIOPSY;  Surgeon: Antony Haste, MD;  [facility]: [facility];  Service:  Urology;  Laterality: N/A;  . Vasectomy     HPI:  Pt is an 77 year olf male arriving for a follow up MBS after an  admission in 10/21. Pt has dx of Parkinsons, underwent botox  injections for neck spasms resulting in dysphagia. Pt had MBS  10/22 recommended puree and honey thick liquids due to severe  residuals and aspiration of residuals. Since d/c pt has been  drinking nectar thick liquids and some thin liquids and soft  solids. He feels that he has improved significantly. He has not  had any f/u with SLP.      Assessment / Plan / Recommendation Clinical Impression  Dysphagia Diagnosis: Mild pharyngeal phase dysphagia Clinical impression: Pt demonstrates improvement in quantity of  residuals present post swallow. Forward head posture and weakness  of hyolaryngeal mechanism still result in moderate pharyngeal  residuals, though they are no longer significant enough to  penetrate airway post swallow. With min verbal cues, pt was able  to successfully follow each moderately sized sip with an  immediate second swallow to clear residue. An occasional throat  clear is also suggested to further protect airway. Recommend pt  consume moist solids (mechanical soft texture) with thin liquids  with careful adherance to strategies. Pt should continue to take  pills in puree. No SLP f/u needed unless there is a significant  change in function/medical status. Discussed this with wife.     Treatment Recommendation  No treatment recommended at this time    Diet Recommendation Dysphagia 3 (Mechanical Soft);Thin liquid   Liquid Administration via: Cup Medication Administration: Whole meds with puree Supervision: Patient able to self feed;Intermittent supervision  to cue for compensatory strategies  Compensations: Slow rate;Small  sips/bites;Multiple dry swallows  after each bite/sip;Clear throat intermittently Postural Changes and/or Swallow Maneuvers: Seated upright 90  degrees    Other  Recommendations     Follow Up Recommendations  None    Frequency and Duration        Pertinent Vitals/Pain NA    SLP Swallow Goals     General HPI: Pt is an 18 year olf male arriving for a follow up  MBS after an admission in 10/21. Pt has dx of Parkinsons,  underwent botox injections for neck spasms resulting in  dysphagia. Pt had MBS 10/22 recommended puree and honey thick  liquids due to severe residuals and sensed aspiration of  residuals. Since d/c pt has been drinking nectar thick liquids  and some thin liquids and soft solids. He feels that he has  improved significantly. He has not had any f/u with SLP.  Type of Study: Modified Barium Swallowing Study Reason for Referral: Objectively evaluate swallowing function Diet Prior to this Study: Thin liquids;Dysphagia 3 (soft) Temperature Spikes Noted: N/A Respiratory Status: Room air History of Recent Intubation: No Behavior/Cognition: Alert;Cooperative;Pleasant mood Oral Cavity - Dentition: Missing dentition;Adequate natural  dentition (missing back dentition) Oral Motor / Sensory Function: Within functional limits Self-Feeding Abilities: Able to feed self Patient Positioning: Upright in chair Baseline Vocal Quality: Clear Volitional Cough: Strong Volitional Swallow: Able to elicit Anatomy:  (forward head posture) Pharyngeal Secretions: Not observed secondary MBS    Reason for Referral Objectively evaluate swallowing function   Oral Phase Oral Preparation/Oral Phase Oral Phase: Impaired Oral Phase - Comment Oral Phase - Comment: mild oral residuals, cleared with second  swallow   Pharyngeal Phase Pharyngeal Phase Pharyngeal Phase: Impaired Pharyngeal - Thin Pharyngeal - Thin Cup: Reduced pharyngeal peristalsis;Reduced  anterior laryngeal mobility;Reduced laryngeal  elevation;Reduced  tongue base retraction;Penetration/Aspiration after  swallow;Pharyngeal residue - valleculae;Pharyngeal residue -  pyriform sinuses Penetration/Aspiration details (thin cup): Material enters  airway, remains ABOVE vocal cords and not ejected out;Material  does not enter airway Pharyngeal - Solids Pharyngeal - Puree: Reduced pharyngeal peristalsis;Reduced  anterior laryngeal mobility;Reduced laryngeal elevation;Reduced  tongue base retraction;Pharyngeal residue - valleculae;Pharyngeal  residue - pyriform sinuses Penetration/Aspiration details (puree): Material does not enter  airway Pharyngeal - Regular: Reduced pharyngeal peristalsis;Reduced  anterior laryngeal mobility;Reduced laryngeal elevation;Reduced  tongue base retraction;Pharyngeal residue - valleculae;Pharyngeal  residue - pyriform sinuses Penetration/Aspiration details (regular): Material does not enter  airway Pharyngeal - Pill: Reduced pharyngeal peristalsis;Reduced  anterior laryngeal mobility;Reduced laryngeal elevation;Reduced  tongue base retraction;Pharyngeal residue - valleculae;Pharyngeal  residue - pyriform sinuses;Penetration/Aspiration during  swallow;Trace aspiration  Cervical Esophageal Phase    GO    Cervical Esophageal Phase Cervical Esophageal Phase: [facility]    Functional Assessment Tool Used: clinical judgement Functional Limitations: Swallowing Swallow Current Status ([id number]): At least 1 percent but less than  20 percent impaired, limited or restricted Swallow Goal Status [phone]): At least 1 percent but less than 20  percent impaired, limited or restricted Swallow Discharge Status [phone]): At least 1 percent but less  than 20 percent impaired, limited or restricted   [facility] [place] CCC-SLP [phone]  Claudine Mouton 03/25/2013, 1:42 PM     Microbiology: No results found for this or any previous visit (from the past 240 hour(s)).   Labs: Basic Metabolic Panel:  Recent Labs Lab 04/09/13 0944  04/10/13 0605 04/11/13 0855  NA 140 135 133*  K 3.8 4.0 3.6  CL 101 100 99  CO2 27 23 24   GLUCOSE 105* 125* 164*  BUN 23 24* 19  CREATININE 0.92 0.99 0.84  CALCIUM 9.3 8.4 8.5   Liver Function Tests: No results found for this basename: AST, ALT, ALKPHOS, BILITOT, PROT, ALBUMIN,  in the last 168 hours No results found for this basename: LIPASE, AMYLASE,  in the last 168 hours No results found for this basename: AMMONIA,  in the last 168 hours CBC:  Recent Labs Lab 04/09/13 0944 04/10/13 0605 04/11/13 0855 04/12/13 0435  WBC 8.3 12.5* 12.8* 11.6*  NEUTROABS 5.9  --   --   --   HGB 14.0 11.4* 12.0* 10.3*  HCT 39.8 32.9* 34.6* 30.8*  MCV 91.1 90.9 90.8 91.7  PLT 222 211 181 201   Cardiac Enzymes: No results found for this basename: CKTOTAL, CKMB, CKMBINDEX, TROPONINI,  in the last 168 hours BNP: BNP (last 3 results) No results found for this basename: PROBNP,  in the last 8760 hours CBG: No results found for this basename: GLUCAP,  in the last 168 hours     Signed:  Tegan Britain  [facility] 04/12/2013, 4:19 PM

## 2013-04-12 NOTE — Progress Notes (Signed)
I await decision by [facility] for admission to inpt rehab vs SNF. Wife is aware. She states if he can not get admitted to inpt rehab, she prefers [facility] or [place] as her second preference. I will follow up tomorrow. [phone]

## 2013-04-12 NOTE — Progress Notes (Signed)
Physical Therapy Treatment Patient Details Name: Ivan Clark MRN: [id number] DOB: June 29, 1928 Today's Date: 04/12/2013 Time: 8295-6213 PT Time Calculation (min): 30 min  PT Assessment / Plan / Recommendation  History of Present Illness Patient is an 79 year old male with history of hypertension, hyperlipidemia, autonomic dysfunction from Parkinson's disease, orthostatic hypotension accident he lost his balance this morning in the kitchen. He has history of frequent falls. He landed on his right hip and was unable to get up or walk due to right hip pain. Patient only has pain when he moves his right hip. X-ray of his right hip showed a displaced right femoral subcapital fracture. Orthopedics was consulted in the ER and he scheduled to have surgery today. Now s/p R hip hemiarthroplasty   PT Comments   Pt with improved stand-pivot transfer from bed>3-in-1 but with significant difficulty with pivoting from 3-in-1>recliner requiring +2 assist.  Pt cont's to report pain well controlled at this time.  Pt would strongly benefit from CIR to maximize strength, balance, safety, & independence with mobility.     Follow Up Recommendations  CIR     Does the patient have the potential to tolerate intense rehabilitation     Barriers to Discharge        Equipment Recommendations  Rolling walker with 5" wheels;3in1 (PT)    Recommendations for Other Services Rehab consult  Frequency Min 5X/week   Progress towards PT Goals Progress towards PT goals: Progressing toward goals  Plan Current plan remains appropriate    Precautions / Restrictions Precautions Precautions: Posterior Hip;Fall Precaution Comments: Reviewed posterior hip precautions.  Restrictions Weight Bearing Restrictions: Yes RLE Weight Bearing: Weight bearing as tolerated   Pertinent Vitals/Pain "it feels fine"    Mobility  Bed Mobility Bed Mobility: Supine to Sit;Sitting - Scoot to Edge of Bed Supine to Sit: 3: Mod assist;HOB  elevated;With rails Sitting - Scoot to Edge of Bed: 3: Mod assist Details for Bed Mobility Assistance: (A) for RLE management, to bring shoulders/trunk to sitting upright, use of draw pad to pivot hips & scoot closer to EOB.   Transfers Transfers: Sit to Stand;Stand to [employer] Transfers Sit to Stand: 3: Mod assist;With upper extremity assist;With armrests;From bed;From chair/3-in-1 Stand to Sit: 3: Mod assist;With upper extremity assist;With armrests;To chair/3-in-1;2: Max assist Stand Pivot Transfers: 3: Mod assist;2: total assist Details for Transfer Assistance: Cues for hand placement & technique.  Mod assist for stand-pivot from bed>3-in-1 but then required total assist for pivot from 3-in-1>recliner.   As he was pivoting from 3-in-1>recliner his feet were out front of his body, narrow BOS, & Lt lateral leaning requiring total (A) to pivot hips around to recliner & to maintain balance.   Ambulation/Gait Ambulation/Gait Assistance: 1: +2 Total assist Ambulation/Gait: Patient Percentage: 50% Ambulation Distance (Feet):  (5 steps forwards + 3 steps backwards) Assistive device: Rolling walker Ambulation/Gait Assistance Details: (A) for balance, lateral weight shift anteriorly & to Rt.  Cues for sequencing, use of RW, posture, foot placement/increase BOS.   BOS towards Rt & trunk leaning to Lt.  Wife states pt has neuropathy & has diffculty telling where his feet are.   Gait Pattern: Step-to pattern;Decreased step length - right;Decreased step length - left;Decreased weight shift to right;Lateral trunk lean to left      PT Goals (current goals can now be found in the care plan section) Acute Rehab PT Goals PT Goal Formulation: With patient Time For Goal Achievement: 04/17/13 Potential to Achieve Goals: Good  Visit Information  Last PT Received On: 04/12/13 Assistance Needed: +1 History of Present Illness: Patient is an 79 year old male with history of hypertension, hyperlipidemia,  autonomic dysfunction from Parkinson's disease, orthostatic hypotension accident he lost his balance this morning in the kitchen. He has history of frequent falls. He landed on his right hip and was unable to get up or walk due to right hip pain. Patient only has pain when he moves his right hip. X-ray of his right hip showed a displaced right femoral subcapital fracture. Orthopedics was consulted in the ER and he scheduled to have surgery today. Now s/p R hip hemiarthroplasty    Subjective Data      Cognition  Cognition Arousal/Alertness: Awake/alert Behavior During Therapy: WFL for tasks assessed/performed Overall Cognitive Status: Within Functional Limits for tasks assessed    Balance     End of Session PT - End of Session Equipment Utilized During Treatment: Gait belt;Oxygen (2L 02 via [place] due to pt c/o difficulty breathing) Activity Tolerance: Patient limited by fatigue;Patient tolerated treatment well Patient left: in chair;with call bell/phone within reach;with family/visitor present Nurse Communication: Mobility status   GP     Lara Mulch 04/12/2013, 11:42 AM   Verdell Face, PTA [phone] 04/12/2013

## 2013-04-12 NOTE — Progress Notes (Signed)
Ivan Clark PROGRESS NOTE  Ivan Clark WUJ:[id number] DOB: 08/13/1928 DOA: 04/09/2013 PCP:  Ivan Lope, MD  Assessment/Plan: Right femoral fracture  - s/p right hemiarthroplasty.  - Will place on very low dose of Dilaudid as needed   Parkinsonism:  - Continue Sinemet and selegiline  Hypertension:  - better controlled.  Hyperlipidemia  - Restart the Lipitor after the surgery  BPH (benign prostatic hypertrophy)  - Continue Flomax, if needed, his urologist is Ivan Clark  Orthostatic hypotension/autonomic dysfunction  - Will continue Florinef  Dysphagia:  - Patient had an outpatient swallowing study done on 03/25/2013, once he is able to tolerate diet he should be on dysphagia 3, thin liquids.  DVT prophylaxis: SCDs for now, place on Lovenox after surgery or per orthopedic recommendations  CODE STATUS: full code  Family Communication: none atbedside.  Disposition Plan: CIR   Consultants: Orthopedics from Ivan Clark.  Procedures: Right hip hemiarthroplasty using diffuse trial  lock prosthesis with monopolar head.  Antibiotics: none HPI/Subjective: Pain is controlled.  Objective: Filed Vitals:   04/12/13 1034  BP: 168/65  Pulse:   Temp:   Resp:     Intake/Output Summary (Last 24 hours) at 04/12/13 1037 Last data filed at 04/12/13 0830  Gross per 24 hour  Intake    720 ml  Output    700 ml  Net     20 ml   Filed Weights   04/09/13 0931  Weight: 77.111 kg (170 lb)    Exam:   General:  Alert afebrile comfortable  Cardiovascular: s1s2  Respiratory: ctab  Abdomen: soft NT ND BS+  Musculoskeletal: no pedal edema.   Data Reviewed: Basic Metabolic Panel:  Recent Labs Lab 04/09/13 0944 04/10/13 0605 04/11/13 0855  NA 140 135 133*  K 3.8 4.0 3.6  CL 101 100 99  CO2 27 23 24   GLUCOSE 105* 125* 164*  BUN 23 24* 19  CREATININE 0.92 0.99 0.84  CALCIUM 9.3 8.4 8.5   Liver Function Tests: No results found for this basename:  AST, ALT, ALKPHOS, BILITOT, PROT, ALBUMIN,  in the last 168 hours No results found for this basename: LIPASE, AMYLASE,  in the last 168 hours No results found for this basename: AMMONIA,  in the last 168 hours CBC:  Recent Labs Lab 04/09/13 0944 04/10/13 0605 04/11/13 0855 04/12/13 0435  WBC 8.3 12.5* 12.8* 11.6*  NEUTROABS 5.9  --   --   --   HGB 14.0 11.4* 12.0* 10.3*  HCT 39.8 32.9* 34.6* 30.8*  MCV 91.1 90.9 90.8 91.7  PLT 222 211 181 201   Cardiac Enzymes: No results found for this basename: CKTOTAL, CKMB, CKMBINDEX, TROPONINI,  in the last 168 hours BNP (last 3 results) No results found for this basename: PROBNP,  in the last 8760 hours CBG: No results found for this basename: GLUCAP,  in the last 168 hours  No results found for this or any previous visit (from the past 240 hour(s)).   Studies: No results found.  Scheduled Meds: . amLODipine  10 mg Oral Daily  . aspirin EC  81 mg Oral QPC supper  . atorvastatin  20 mg Oral Daily  . B-complex with vitamin C  1 tablet Oral Daily  . carbidopa-levodopa  1 tablet Oral QHS  . carbidopa-levodopa  1 tablet Oral TID WC  . cholecalciferol  2,000 Units Oral Daily  . docusate sodium  100 mg Oral BID  . dutasteride  0.5 mg Oral QPC supper  .  enoxaparin (LOVENOX) injection  40 mg Subcutaneous Q24H  . feeding supplement (ENSURE COMPLETE)  237 mL Oral BID BM  . fentaNYL  50-100 mcg Intravenous Once  . ferrous sulfate  325 mg Oral TID PC  . fludrocortisone  0.1 mg Oral Daily  . irbesartan  150 mg Oral BID  . multivitamin with minerals  1 tablet Oral Daily  . pantoprazole  40 mg Oral Q0600  . pneumococcal 23 valent vaccine  0.5 mL Intramuscular Tomorrow-1000  . polyethylene glycol  17 g Oral BID  . selegiline  5 mg Oral BID WC  . tamsulosin  0.4 mg Oral QPC supper   Continuous Infusions: . lactated ringers 20 mL/hr at 04/12/13 0250    Principal Problem:   Right femoral fracture Active Problems:   Parkinsonism    Hypertension   Hyperlipidemia   BPH (benign prostatic hypertrophy)   Closed right hip fracture   Hip fracture, right   Femoral neck fracture    Time spent: 25 min    Ivan Clark  Ivan Clark Pager [phone] If 7PM-7AM, please contact night-coverage at [email or web address], password [email or web address] 04/12/2013, 10:37 AM  LOS: 3 days

## 2013-04-13 LAB — PROCALCITONIN: Procalcitonin: <0.1 ng/mL

## 2013-04-13 MED ORDER — PNEUMOCOCCAL VAC POLYVALENT 25 MCG/0.5ML IJ INJ
0.5000 mL | INJECTION | INTRAMUSCULAR | Status: AC
  Administered 2013-04-14: 0.5 mL via INTRAMUSCULAR
  Filled 2013-04-13: qty 0.5

## 2013-04-13 MED ORDER — SELEGILINE HCL 5 MG PO TABS
5.0000 mg | ORAL_TABLET | Freq: Two times a day (BID) | ORAL | Status: DC
  Administered 2013-04-13 – 2013-04-14 (×4): 5 mg via ORAL
  Filled 2013-04-13 (×5): qty 1

## 2013-04-13 NOTE — Progress Notes (Signed)
Physical Therapy Treatment Patient Details Name: Ivan Clark MRN: [id number] DOB: 1928-09-13 Today's Date: 04/13/2013 Time: 1478-2956 PT Time Calculation (min): 23 min  PT Assessment / Plan / Recommendation  History of Present Illness Patient is an 79 year old male with history of hypertension, hyperlipidemia, autonomic dysfunction from Parkinson's disease, orthostatic hypotension accident he lost his balance this morning in the kitchen. He has history of frequent falls. He landed on his right hip and was unable to get up or walk due to right hip pain. Patient only has pain when he moves his right hip. X-ray of his right hip showed a displaced right femoral subcapital fracture. Orthopedics was consulted in the ER and he scheduled to have surgery today. Now s/p R hip hemiarthroplasty   PT Comments   Pt made great progress with mobility today.  Ambulated ~25' with RW but cont's to demonstrate decreased balance & independence with mobility.    Follow Up Recommendations  CIR     Does the patient have the potential to tolerate intense rehabilitation     Barriers to Discharge        Equipment Recommendations  Rolling walker with 5" wheels;3in1 (PT)    Recommendations for Other Services    Frequency Min 5X/week   Progress towards PT Goals Progress towards PT goals: Progressing toward goals  Plan Current plan remains appropriate    Precautions / Restrictions Precautions Precautions: Posterior Hip;Fall Precaution Comments: Reviewed posterior hip precautions.  Restrictions RLE Weight Bearing: Weight bearing as tolerated       Mobility  Bed Mobility Bed Mobility: Not assessed Rolling Right: 3: Mod assist;With rail Rolling Left: 4: Min assist;With rail (pillow between knees) Supine to Sit: 3: Mod assist;HOB flat;With rails Sitting - Scoot to Edge of Bed: 3: Mod assist;With rail Transfers Transfers: Sit to Stand;Stand to Sit Sit to Stand: 3: Mod assist;With upper extremity  assist;With armrests;From chair/3-in-1 Stand to Sit: 4: Min assist;With upper extremity assist;With armrests;To chair/3-in-1 Details for Transfer Assistance: (A) to achieve standing, shift weight anteriorly, balance, & controlled descent.   Ambulation/Gait Ambulation/Gait Assistance: 1: +2 Total assist (+2 to follow with recliner) Ambulation/Gait: Patient Percentage: 70% Ambulation Distance (Feet): 25 Feet Assistive device: Rolling walker Ambulation/Gait Assistance Details: Cues for sequencing, posture, safe use of RW,  (A) for balance, RW advancement.   Gait Pattern: Step-through pattern;Decreased step length - right;Decreased step length - left;Decreased stride length;Trunk flexed (decreased floor clearance) Gait velocity: slow Stairs: No Wheelchair Mobility Wheelchair Mobility: No    Exercises Total Joint Exercises Ankle Circles/Pumps: AROM;Both;10 reps Heel Slides: AROM;Strengthening;Right;10 reps Hip ABduction/ADduction: AAROM;Strengthening;Right;10 reps Straight Leg Raises: Right;10 reps;AAROM;Strengthening Long Arc Quad: AROM;Strengthening;Right;10 reps     PT Goals (current goals can now be found in the care plan section) Acute Rehab PT Goals PT Goal Formulation: With patient Time For Goal Achievement: 04/17/13 Potential to Achieve Goals: Good  Visit Information  Last PT Received On: 04/13/13 Assistance Needed: +1 History of Present Illness: Patient is an 79 year old male with history of hypertension, hyperlipidemia, autonomic dysfunction from Parkinson's disease, orthostatic hypotension accident he lost his balance this morning in the kitchen. He has history of frequent falls. He landed on his right hip and was unable to get up or walk due to right hip pain. Patient only has pain when he moves his right hip. X-ray of his right hip showed a displaced right femoral subcapital fracture. Orthopedics was consulted in the ER and he scheduled to have surgery today. Now s/p R hip  hemiarthroplasty  Subjective Data      Cognition  Cognition Arousal/Alertness: Awake/alert Behavior During Therapy: WFL for tasks assessed/performed Overall Cognitive Status: Within Functional Limits for tasks assessed    Balance     End of Session PT - End of Session Equipment Utilized During Treatment: Gait belt;Cervical collar Activity Tolerance: Patient tolerated treatment well Patient left: in chair;with call bell/phone within reach;with family/visitor present Nurse Communication: Mobility status   GP     Lara Mulch 04/13/2013, 2:54 PM   Verdell Face, PTA [phone] 04/13/2013

## 2013-04-13 NOTE — Progress Notes (Signed)
Occupational Therapy Treatment Patient Details Name: Ivan Clark MRN: [id number] DOB: November 23, 1928 Today's Date: 04/13/2013 Time: 6962-9528 OT Time Calculation (min): 28 min  OT Assessment / Plan / Recommendation  History of present illness Patient is an 79 year old male with history of hypertension, hyperlipidemia, autonomic dysfunction from Parkinson's disease, orthostatic hypotension accident he lost his balance this morning in the kitchen. He has history of frequent falls. He landed on his right hip and was unable to get up or walk due to right hip pain. Patient only has pain when he moves his right hip. X-ray of his right hip showed a displaced right femoral subcapital fracture. Orthopedics was consulted in the ER and he scheduled to have surgery today. Now s/p R hip hemiarthroplasty   OT comments  This patient making progress and would definitely benefit from an inpatient rehab stay to get to a S-Mod I level.  Follow Up Recommendations  CIR       Equipment Recommendations  3 in 1 bedside comode       Frequency Min 2X/week   Progress towards OT Goals Progress towards OT goals: Progressing toward goals  Plan Discharge plan remains appropriate    Precautions / Restrictions Precautions Precautions: Posterior Hip;Fall Restrictions RLE Weight Bearing: Weight bearing as tolerated   Pertinent Vitals/Pain None--just pressure per pt    ADL  Toilet Transfer: Moderate assistance Toilet Transfer Method: Sit to [employer]:  (Bed(raised)>8 steps forward>sit in recliner behind him) Transfers/Ambulation Related to ADLs: Mod A sit>stand from raised bed; Min A stand>sit to recliner; min A for ambulation with RW with max VCs for sequencing and not stepping too far into walker      OT Goals(current goals can now be found in the care plan section)    Visit Information  Last OT Received On: 04/13/13 Assistance Needed: +1 History of Present Illness: Patient is an  79 year old male with history of hypertension, hyperlipidemia, autonomic dysfunction from Parkinson's disease, orthostatic hypotension accident he lost his balance this morning in the kitchen. He has history of frequent falls. He landed on his right hip and was unable to get up or walk due to right hip pain. Patient only has pain when he moves his right hip. X-ray of his right hip showed a displaced right femoral subcapital fracture. Orthopedics was consulted in the ER and he scheduled to have surgery today. Now s/p R hip hemiarthroplasty          Cognition  Cognition Arousal/Alertness: Awake/alert Behavior During Therapy: WFL for tasks assessed/performed Overall Cognitive Status: Within Functional Limits for tasks assessed    Mobility  Bed Mobility Bed Mobility: Rolling Right;Rolling Left Rolling Right: 3: Mod assist;With rail Rolling Left: 4: Min assist;With rail (pillow between knees) Supine to Sit: 3: Mod assist;HOB flat;With rails Sitting - Scoot to Edge of Bed: 3: Mod assist;With rail Transfers Transfers: Sit to Stand;Stand to Sit Sit to Stand: 3: Mod assist;With upper extremity assist;From bed (tends to want to posterior lean) Stand to Sit: 4: Min assist;With upper extremity assist;With armrests;To chair/3-in-1 Details for Transfer Assistance: VCs for safe hand placement          End of Session OT - End of Session Equipment Utilized During Treatment: Gait belt;Rolling walker Activity Tolerance: Patient tolerated treatment well Patient left: in chair;with call bell/phone within reach;with family/visitor present    Evette Georges [phone] 04/13/2013, 11:13 AM

## 2013-04-13 NOTE — Progress Notes (Signed)
[facility]                                                                                Patient Demographics  Ivan Clark, is a 79 y.o. male, DOB - [birth year], NWG:[id number]  Admit date - 04/09/2013   Admitting Physician Ripudeep Jenna Luo, MD  Outpatient Primary MD for the patient is  Duane Lope, MD  LOS - 4   Chief Complaint  Patient presents with  . Fall  . Hip Pain    Right hip        Assessment & Plan  Principal Problem:   Right femoral fracture Active Problems:   Parkinsonism   Hypertension   Hyperlipidemia   BPH (benign prostatic hypertrophy)   Closed right hip fracture   Hip fracture, right   Femoral neck fracture  Right femoral fracture  - s/p right hemiarthroplasty.  - pain control and PT evaluated and recommended CIR.  - outpatient follow up with orthopedics.   Parkinsonism  - Continue Sinemet and selegiline   Hypertension:  - better controlled.   Hyperlipidemia  - continue with lipitor on discharge.   BPH (benign prostatic hypertrophy)  - Continue Flomax, if needed, his urologist is Dr. Jerilee Field   Orthostatic hypotension/autonomic dysfunction  - Will continue Florinef   Dysphagia:  - Patient had an outpatient swallowing study done on 03/25/2013, he should be on dysphagia 3, thin liquids   Code Status: Full  Family Communication: None at this time.  Disposition Plan: Patient is stable for discharge. Please see discharge summary conducted on 12 2014 by Dr. Blake Divine. Patient is currently pending inpatient rehabilitation placement.  Procedures  Right hip hemiarthroplasty using diffuse trial  lock prosthesis with monopolar head.  Consults   Orthopedics, Dr. [name]  DVT Prophylaxis  Lovenox   Lab Results  Component Value Date   PLT 201 04/12/2013    Medications  Scheduled Meds: . amLODipine  10 mg Oral Daily  . aspirin EC  81 mg Oral QPC supper  . atorvastatin  20 mg Oral Daily  . B-complex with vitamin C  1 tablet  Oral Daily  . carbidopa-levodopa  1 tablet Oral QHS  . carbidopa-levodopa  1 tablet Oral TID WC  . cholecalciferol  2,000 Units Oral Daily  . docusate sodium  100 mg Oral BID  . dutasteride  0.5 mg Oral QPC supper  . enoxaparin (LOVENOX) injection  40 mg Subcutaneous Q24H  . feeding supplement (ENSURE COMPLETE)  237 mL Oral BID BM  . fentaNYL  50-100 mcg Intravenous Once  . ferrous sulfate  325 mg Oral TID PC  . fludrocortisone  0.1 mg Oral Daily  . irbesartan  150 mg Oral BID  . multivitamin with minerals  1 tablet Oral Daily  . pantoprazole  40 mg Oral Q0600  . pneumococcal 23 valent vaccine  0.5 mL Intramuscular Tomorrow-1000  . polyethylene glycol  17 g Oral BID  . selegiline  5 mg Oral BID WC  . tamsulosin  0.4 mg Oral QPC supper   Continuous Infusions: . lactated ringers 20 mL/hr at 04/12/13 0250   PRN Meds:.acetaminophen, acetaminophen, alum & mag hydroxide-simeth, bisacodyl, HYDROcodone-acetaminophen, HYDROmorphone (DILAUDID) injection,  menthol-cetylpyridinium, methocarbamol (ROBAXIN) IV, methocarbamol, metoCLOPramide (REGLAN) injection, metoCLOPramide, midazolam, morphine injection, ondansetron (ZOFRAN) IV, ondansetron, phenol  Antibiotics    Anti-infectives   Start     Dose/Rate Route Frequency Ordered Stop   04/09/13 2100  ceFAZolin (ANCEF) IVPB 2 g/50 mL premix     2 g 100 mL/hr over 30 Minutes Intravenous Every 6 hours 04/09/13 1745 04/10/13 0400   04/09/13 1457  ceFAZolin (ANCEF) 2-3 GM-% IVPB SOLR    Comments:  Alanda Amass   : cabinet override      04/09/13 1457 04/09/13 1518       Time Spent in minutes   30 minutes   Arrick Dutton D.O. on 04/13/2013 at 1:15 PM  Between 7am to 7pm - Pager - [phone]  After 7pm go to [email or web address] - password [email or web address]  And look for the night coverage person covering for me after hours  [facility] Office  [phone]    Subjective:   Michaelpaul Apo seen and examined today.  Patient has no  complaints at this time. Patient denies dizziness, chest pain, shortness of breath, abdominal pain, N/V/D/C, new weakness, numbess, tingling.    Objective:   Filed Vitals:   04/13/13 0321 04/13/13 0504 04/13/13 1021 04/13/13 1242  BP:  182/60 182/60 148/61  Pulse:  80  85  Temp:  98.2 F (36.8 C)  98.9 F (37.2 C)  TempSrc:  Oral    Resp: 18 18  18   Height:      Weight:      SpO2: 94% 92%  93%    Wt Readings from Last 3 Encounters:  04/09/13 77.111 kg (170 lb)  04/09/13 77.111 kg (170 lb)  03/08/13 76.204 kg (168 lb)     Intake/Output Summary (Last 24 hours) at 04/13/13 1315 Last data filed at 04/13/13 0630  Gross per 24 hour  Intake    230 ml  Output    600 ml  Net   -370 ml    Exam  General: Well developed, well nourished, NAD, appears stated age  HEENT: NCAT, PERRLA, EOMI, Anicteic Sclera, mucous membranes moist. No pharyngeal erythema or exudates  Neck: Supple, no JVD, no masses  Cardiovascular: S1 S2 auscultated, no rubs, murmurs or gallops. Regular rate and rhythm.  Respiratory: Clear to auscultation bilaterally with equal chest rise  Abdomen: Soft, nontender, nondistended, + bowel sounds  Extremities: warm dry without cyanosis clubbing or edema  Neuro: AAOx3, cranial nerves grossly intact.   Psych: Normal affect and demeanor with intact judgement and insight  Data Review   Micro Results No results found for this or any previous visit (from the past 240 hour(s)).  Radiology Reports Dg Chest 1 View  04/09/2013   CLINICAL DATA:  Fall and hip pain.  EXAM: CHEST - 1 VIEW  COMPARISON:  03/01/2013  FINDINGS: The lungs are clear bilaterally. Stable appearance of the heart and mediastinum. The trachea is midline. No evidence for a pneumothorax. Bony thorax is intact.  IMPRESSION: No acute cardiopulmonary disease.   Electronically Signed   By: Richarda Overlie M.D.   On: 04/09/2013 11:28   Dg Hip Complete Right  04/09/2013   CLINICAL DATA:  Fall and right hip  pain.  EXAM: RIGHT HIP - COMPLETE 2+ VIEW  COMPARISON:  None.  FINDINGS: There is a mildly displaced right subcapital femoral fracture. The right femoral trochanteric region is mildly displaced superiorly. The right femoral head is located. The pelvic bony ring is intact. No other definite fractures.  IMPRESSION: Displaced right femoral subcapital fracture.   Electronically Signed   By: Richarda Overlie M.D.   On: 04/09/2013 11:21   Dg Pelvis Portable  04/09/2013   CLINICAL DATA:  Postop  EXAM: PORTABLE PELVIS 1-2 VIEWS  COMPARISON:  1030 hr  FINDINGS: Right hip hemiarthroplasty has been placed. Anatomic alignment of the osseous and prostatic structures. No breakage or loosening of the hardware.  IMPRESSION: Right hip hemiarthroplasty anatomically aligned.   Electronically Signed   By: Maryclare Bean M.D.   On: 04/09/2013 17:35   Dg Swallowing Func-speech Pathology  03/25/2013   Riley Nearing Deblois, CCC-SLP     03/25/2013  1:45 PM Objective Swallowing Evaluation: Modified Barium Swallowing Study   Patient Details  Name: LEANDER TOUT MRN: [id number] Date of Birth: May 21, 1928  Today's Date: 03/25/2013 Time: 1140-1205 SLP Time Calculation (min): 25 min  Past Medical History:  Past Medical History  Diagnosis Date  . Hypertension   . Hyperlipidemia   . Aneurysm of infrarenal abdominal aorta SUPRAILIAC  4CM PER CT  10-20-2010--  STABLE  . Bladder cancer RECURRENT  (FIRST DX  2001)    HX TURBT AND CHEMO BLADDER INSTILLATION TX'S  . Parkinson disease   . Frequency of urination   . Nocturia   . Orthostatic hypotension    Past Surgical History:  Past Surgical History  Procedure Laterality Date  . Tonsillectomy and adenoidectomy  1938  . Left inguinal hernia repair  1996  . Transurethral resection of bladder tumor  09-11-1999    BLADDER CANCER  . Cysto/ retrograde pyelogram/ bladder bx's  07-03-2004  . Cysto/ bladder bx/ fulgeration bladder tumor  10-09-2004  . Appendectomy  10-20-2010  . Cystoscopy with biopsy  11/28/2011     Procedure: CYSTOSCOPY WITH BIOPSY;  Surgeon: Antony Haste, MD;  Location: [facility];  Service:  Urology;  Laterality: N/A;  . Vasectomy     HPI:  Pt is an 37 year olf male arriving for a follow up MBS after an  admission in 10/21. Pt has dx of Parkinsons, underwent botox  injections for neck spasms resulting in dysphagia. Pt had MBS  10/22 recommended puree and honey thick liquids due to severe  residuals and aspiration of residuals. Since d/c pt has been  drinking nectar thick liquids and some thin liquids and soft  solids. He feels that he has improved significantly. He has not  had any f/u with SLP.      Assessment / Plan / Recommendation Clinical Impression  Dysphagia Diagnosis: Mild pharyngeal phase dysphagia Clinical impression: Pt demonstrates improvement in quantity of  residuals present post swallow. Forward head posture and weakness  of hyolaryngeal mechanism still result in moderate pharyngeal  residuals, though they are no longer significant enough to  penetrate airway post swallow. With min verbal cues, pt was able  to successfully follow each moderately sized sip with an  immediate second swallow to clear residue. An occasional throat  clear is also suggested to further protect airway. Recommend pt  consume moist solids (mechanical soft texture) with thin liquids  with careful adherance to strategies. Pt should continue to take  pills in puree. No SLP f/u needed unless there is a significant  change in function/medical status. Discussed this with wife.     Treatment Recommendation  No treatment recommended at this time    Diet Recommendation Dysphagia 3 (Mechanical Soft);Thin liquid   Liquid Administration via: Cup Medication Administration: Whole meds with puree Supervision: Patient  able to self feed;Intermittent supervision  to cue for compensatory strategies Compensations: Slow rate;Small sips/bites;Multiple dry swallows  after each bite/sip;Clear throat  intermittently Postural Changes and/or Swallow Maneuvers: Seated upright 90  degrees    Other  Recommendations     Follow Up Recommendations  None    Frequency and Duration        Pertinent Vitals/Pain NA    SLP Swallow Goals     General HPI: Pt is an 51 year olf male arriving for a follow up  MBS after an admission in 10/21. Pt has dx of Parkinsons,  underwent botox injections for neck spasms resulting in  dysphagia. Pt had MBS 10/22 recommended puree and honey thick  liquids due to severe residuals and sensed aspiration of  residuals. Since d/c pt has been drinking nectar thick liquids  and some thin liquids and soft solids. He feels that he has  improved significantly. He has not had any f/u with SLP.  Type of Study: Modified Barium Swallowing Study Reason for Referral: Objectively evaluate swallowing function Diet Prior to this Study: Thin liquids;Dysphagia 3 (soft) Temperature Spikes Noted: N/A Respiratory Status: Room air History of Recent Intubation: No Behavior/Cognition: Alert;Cooperative;Pleasant mood Oral Cavity - Dentition: Missing dentition;Adequate natural  dentition (missing back dentition) Oral Motor / Sensory Function: Within functional limits Self-Feeding Abilities: Able to feed self Patient Positioning: Upright in chair Baseline Vocal Quality: Clear Volitional Cough: Strong Volitional Swallow: Able to elicit Anatomy:  (forward head posture) Pharyngeal Secretions: Not observed secondary MBS    Reason for Referral Objectively evaluate swallowing function   Oral Phase Oral Preparation/Oral Phase Oral Phase: Impaired Oral Phase - Comment Oral Phase - Comment: mild oral residuals, cleared with second  swallow   Pharyngeal Phase Pharyngeal Phase Pharyngeal Phase: Impaired Pharyngeal - Thin Pharyngeal - Thin Cup: Reduced pharyngeal peristalsis;Reduced  anterior laryngeal mobility;Reduced laryngeal elevation;Reduced  tongue base retraction;Penetration/Aspiration after  swallow;Pharyngeal residue -  valleculae;Pharyngeal residue -  pyriform sinuses Penetration/Aspiration details (thin cup): Material enters  airway, remains ABOVE vocal cords and not ejected out;Material  does not enter airway Pharyngeal - Solids Pharyngeal - Puree: Reduced pharyngeal peristalsis;Reduced  anterior laryngeal mobility;Reduced laryngeal elevation;Reduced  tongue base retraction;Pharyngeal residue - valleculae;Pharyngeal  residue - pyriform sinuses Penetration/Aspiration details (puree): Material does not enter  airway Pharyngeal - Regular: Reduced pharyngeal peristalsis;Reduced  anterior laryngeal mobility;Reduced laryngeal elevation;Reduced  tongue base retraction;Pharyngeal residue - valleculae;Pharyngeal  residue - pyriform sinuses Penetration/Aspiration details (regular): Material does not enter  airway Pharyngeal - Pill: Reduced pharyngeal peristalsis;Reduced  anterior laryngeal mobility;Reduced laryngeal elevation;Reduced  tongue base retraction;Pharyngeal residue - valleculae;Pharyngeal  residue - pyriform sinuses;Penetration/Aspiration during  swallow;Trace aspiration  Cervical Esophageal Phase    GO    Cervical Esophageal Phase Cervical Esophageal Phase: [facility]    Functional Assessment Tool Used: clinical judgement Functional Limitations: Swallowing Swallow Current Status ([id number]): At least 1 percent but less than  20 percent impaired, limited or restricted Swallow Goal Status [phone]): At least 1 percent but less than 20  percent impaired, limited or restricted Swallow Discharge Status [phone]): At least 1 percent but less  than 20 percent impaired, limited or restricted   [facility], MA CCC-SLP [phone]  Claudine Mouton 03/25/2013, 1:42 PM     CBC  Recent Labs Lab 04/09/13 0944 04/10/13 0605 04/11/13 0855 04/12/13 0435  WBC 8.3 12.5* 12.8* 11.6*  HGB 14.0 11.4* 12.0* 10.3*  HCT 39.8 32.9* 34.6* 30.8*  PLT 222 211 181 201  MCV 91.1 90.9 90.8 91.7  MCH 32.0 31.5 31.5 30.7  MCHC 35.2 34.7 34.7 33.4   RDW 12.9 13.2 13.2 13.1  LYMPHSABS 1.6  --   --   --   MONOABS 0.6  --   --   --   EOSABS 0.2  --   --   --   BASOSABS 0.0  --   --   --     Chemistries   Recent Labs Lab 04/09/13 0944 04/10/13 0605 04/11/13 0855  NA 140 135 133*  K 3.8 4.0 3.6  CL 101 100 99  CO2 27 23 24   GLUCOSE 105* 125* 164*  BUN 23 24* 19  CREATININE 0.92 0.99 0.84  CALCIUM 9.3 8.4 8.5   ------------------------------------------------------------------------------------------------------------------ estimated creatinine clearance is 66.3 ml/min (by C-G formula based on Cr of 0.84). ------------------------------------------------------------------------------------------------------------------ No results found for this basename: HGBA1C,  in the last 72 hours ------------------------------------------------------------------------------------------------------------------ No results found for this basename: CHOL, HDL, LDLCALC, TRIG, CHOLHDL, LDLDIRECT,  in the last 72 hours ------------------------------------------------------------------------------------------------------------------ No results found for this basename: TSH, T4TOTAL, FREET3, T3FREE, THYROIDAB,  in the last 72 hours ------------------------------------------------------------------------------------------------------------------ No results found for this basename: VITAMINB12, FOLATE, FERRITIN, TIBC, IRON, RETICCTPCT,  in the last 72 hours  Coagulation profile  Recent Labs Lab 04/09/13 0944  INR 1.04    No results found for this basename: DDIMER,  in the last 72 hours  Cardiac Enzymes No results found for this basename: CK, CKMB, TROPONINI, MYOGLOBIN,  in the last 168 hours ------------------------------------------------------------------------------------------------------------------ No components found with this basename: POCBNP,

## 2013-04-13 NOTE — Progress Notes (Signed)
I await [employer] decision for inpt rehab vs SNF admission. [phone]

## 2013-04-14 ENCOUNTER — Encounter (HOSPITAL_COMMUNITY): Payer: Self-pay | Admitting: *Deleted

## 2013-04-14 ENCOUNTER — Inpatient Hospital Stay (HOSPITAL_COMMUNITY)
Admission: RE | Admit: 2013-04-14 | Discharge: 2013-04-23 | DRG: 945 | Disposition: A | Discharge status: Home Health | Payer: Medicare Other | Source: Intra-hospital | Attending: Physical Medicine & Rehabilitation | Admitting: Physical Medicine & Rehabilitation

## 2013-04-14 DIAGNOSIS — S72033A Displaced midcervical fracture of unspecified femur, initial encounter for closed fracture: Secondary | ICD-10-CM | POA: Diagnosis present

## 2013-04-14 DIAGNOSIS — R1310 Dysphagia, unspecified: Secondary | ICD-10-CM | POA: Diagnosis present

## 2013-04-14 DIAGNOSIS — S72009A Fracture of unspecified part of neck of unspecified femur, initial encounter for closed fracture: Secondary | ICD-10-CM

## 2013-04-14 DIAGNOSIS — G20 Parkinson's disease: Secondary | ICD-10-CM | POA: Diagnosis present

## 2013-04-14 DIAGNOSIS — Z8551 Personal history of malignant neoplasm of bladder: Secondary | ICD-10-CM

## 2013-04-14 DIAGNOSIS — G609 Hereditary and idiopathic neuropathy, unspecified: Secondary | ICD-10-CM | POA: Diagnosis present

## 2013-04-14 DIAGNOSIS — W1789XA Other fall from one level to another, initial encounter: Secondary | ICD-10-CM | POA: Diagnosis present

## 2013-04-14 DIAGNOSIS — Z5189 Encounter for other specified aftercare: Secondary | ICD-10-CM | POA: Diagnosis present

## 2013-04-14 DIAGNOSIS — K5900 Constipation, unspecified: Secondary | ICD-10-CM | POA: Diagnosis present

## 2013-04-14 DIAGNOSIS — S72001A Fracture of unspecified part of neck of right femur, initial encounter for closed fracture: Secondary | ICD-10-CM

## 2013-04-14 DIAGNOSIS — I951 Orthostatic hypotension: Secondary | ICD-10-CM | POA: Diagnosis present

## 2013-04-14 DIAGNOSIS — I10 Essential (primary) hypertension: Secondary | ICD-10-CM | POA: Diagnosis present

## 2013-04-14 DIAGNOSIS — D62 Acute posthemorrhagic anemia: Secondary | ICD-10-CM | POA: Diagnosis present

## 2013-04-14 DIAGNOSIS — E785 Hyperlipidemia, unspecified: Secondary | ICD-10-CM | POA: Diagnosis present

## 2013-04-14 DIAGNOSIS — R42 Dizziness and giddiness: Secondary | ICD-10-CM

## 2013-04-14 DIAGNOSIS — Z7982 Long term (current) use of aspirin: Secondary | ICD-10-CM

## 2013-04-14 DIAGNOSIS — N400 Enlarged prostate without lower urinary tract symptoms: Secondary | ICD-10-CM | POA: Diagnosis present

## 2013-04-14 DIAGNOSIS — G20A1 Parkinson's disease without dyskinesia, without mention of fluctuations: Secondary | ICD-10-CM | POA: Diagnosis present

## 2013-04-14 LAB — CBC
HCT: 34.8 % — ABNORMAL LOW (ref 39.0–52.0)
Hemoglobin: 11.8 g/dL — ABNORMAL LOW (ref 13.0–17.0)
MCHC: 33.9 g/dL (ref 30.0–36.0)
RBC: 3.77 MIL/uL — ABNORMAL LOW (ref 4.22–5.81)
RDW: 13 % (ref 11.5–15.5)
WBC: 11.2 10*3/uL — ABNORMAL HIGH (ref 4.0–10.5)

## 2013-04-14 LAB — CREATININE, SERUM
Creatinine, Ser: 0.84 mg/dL (ref 0.50–1.35)
GFR calc Af Amer: >90 mL/min (ref >90)
GFR calc non Af Amer: 79 mL/min — ABNORMAL LOW (ref >90)

## 2013-04-14 MED ORDER — ADULT MULTIVITAMIN W/MINERALS CH
1.0000 | ORAL_TABLET | Freq: Every day | ORAL | Status: DC
  Administered 2013-04-15 – 2013-04-23 (×9): 1 via ORAL
  Filled 2013-04-14 (×10): qty 1

## 2013-04-14 MED ORDER — ONDANSETRON HCL 4 MG PO TABS: 4.0000 mg | ORAL_TABLET | Freq: Four times a day (QID) | ORAL | Status: DC | PRN

## 2013-04-14 MED ORDER — IRBESARTAN 150 MG PO TABS
150.0000 mg | ORAL_TABLET | Freq: Two times a day (BID) | ORAL | Status: DC
  Administered 2013-04-14 – 2013-04-23 (×18): 150 mg via ORAL
  Filled 2013-04-14 (×20): qty 1

## 2013-04-14 MED ORDER — B COMPLEX-C PO TABS
1.0000 | ORAL_TABLET | Freq: Every day | ORAL | Status: DC
  Administered 2013-04-15 – 2013-04-23 (×9): 1 via ORAL
  Filled 2013-04-14 (×10): qty 1

## 2013-04-14 MED ORDER — ALUM & MAG HYDROXIDE-SIMETH 200-200-20 MG/5ML PO SUSP
15.0000 mL | ORAL | Status: DC | PRN
Start: 2013-04-14 — End: 2013-04-23

## 2013-04-14 MED ORDER — ASPIRIN EC 81 MG PO TBEC
81.0000 mg | DELAYED_RELEASE_TABLET | Freq: Every day | ORAL | Status: DC
  Administered 2013-04-14 – 2013-04-22 (×9): 81 mg via ORAL
  Filled 2013-04-14 (×10): qty 1

## 2013-04-14 MED ORDER — POLYETHYLENE GLYCOL 3350 17 G PO PACK
17.0000 g | PACK | Freq: Two times a day (BID) | ORAL | Status: DC
  Administered 2013-04-14 – 2013-04-20 (×7): 17 g via ORAL
  Filled 2013-04-14 (×20): qty 1

## 2013-04-14 MED ORDER — ENSURE COMPLETE PO LIQD
237.0000 mL | Freq: Two times a day (BID) | ORAL | Status: DC
  Administered 2013-04-15 – 2013-04-20 (×9): 237 mL via ORAL

## 2013-04-14 MED ORDER — DUTASTERIDE 0.5 MG PO CAPS
0.5000 mg | ORAL_CAPSULE | Freq: Every day | ORAL | Status: DC
  Administered 2013-04-14 – 2013-04-22 (×9): 0.5 mg via ORAL
  Filled 2013-04-14 (×10): qty 1

## 2013-04-14 MED ORDER — CARBIDOPA-LEVODOPA 25-100 MG PO TABS
1.0000 | ORAL_TABLET | Freq: Three times a day (TID) | ORAL | Status: DC
  Administered 2013-04-14 – 2013-04-23 (×26): 1 via ORAL
  Filled 2013-04-14 (×30): qty 1

## 2013-04-14 MED ORDER — PANTOPRAZOLE SODIUM 40 MG PO TBEC
40.0000 mg | DELAYED_RELEASE_TABLET | Freq: Every day | ORAL | Status: DC
  Administered 2013-04-15 – 2013-04-23 (×9): 40 mg via ORAL
  Filled 2013-04-14 (×10): qty 1

## 2013-04-14 MED ORDER — HYDROCODONE-ACETAMINOPHEN 5-325 MG PO TABS
1.0000 | ORAL_TABLET | Freq: Four times a day (QID) | ORAL | Status: DC | PRN
  Filled 2013-04-14: qty 2

## 2013-04-14 MED ORDER — FLUDROCORTISONE ACETATE 0.1 MG PO TABS
0.1000 mg | ORAL_TABLET | Freq: Every day | ORAL | Status: DC
  Administered 2013-04-15 – 2013-04-23 (×9): 0.1 mg via ORAL
  Filled 2013-04-14 (×10): qty 1

## 2013-04-14 MED ORDER — ACETAMINOPHEN 325 MG PO TABS
650.0000 mg | ORAL_TABLET | Freq: Four times a day (QID) | ORAL | Status: DC | PRN
  Filled 2013-04-14: qty 2

## 2013-04-14 MED ORDER — METHOCARBAMOL 500 MG PO TABS: 500.0000 mg | ORAL_TABLET | Freq: Four times a day (QID) | ORAL | Status: DC | PRN

## 2013-04-14 MED ORDER — ACETAMINOPHEN 650 MG RE SUPP: 650.0000 mg | Freq: Four times a day (QID) | RECTAL | Status: DC | PRN

## 2013-04-14 MED ORDER — AMLODIPINE BESYLATE 10 MG PO TABS
10.0000 mg | ORAL_TABLET | Freq: Every day | ORAL | Status: DC
  Administered 2013-04-15 – 2013-04-23 (×9): 10 mg via ORAL
  Filled 2013-04-14 (×10): qty 1

## 2013-04-14 MED ORDER — TAMSULOSIN HCL 0.4 MG PO CAPS
0.4000 mg | ORAL_CAPSULE | Freq: Every day | ORAL | Status: DC
  Administered 2013-04-14 – 2013-04-22 (×9): 0.4 mg via ORAL
  Filled 2013-04-14 (×10): qty 1

## 2013-04-14 MED ORDER — BISACODYL 10 MG RE SUPP: 10.0000 mg | Freq: Every day | RECTAL | Status: DC | PRN

## 2013-04-14 MED ORDER — SELEGILINE HCL 5 MG PO TABS
5.0000 mg | ORAL_TABLET | Freq: Two times a day (BID) | ORAL | Status: DC
  Administered 2013-04-15 – 2013-04-23 (×17): 5 mg via ORAL
  Filled 2013-04-14 (×19): qty 1

## 2013-04-14 MED ORDER — CARBIDOPA-LEVODOPA ER 50-200 MG PO TBCR
1.0000 | EXTENDED_RELEASE_TABLET | Freq: Every day | ORAL | Status: DC
  Administered 2013-04-14 – 2013-04-22 (×9): 1 via ORAL
  Filled 2013-04-14 (×11): qty 1

## 2013-04-14 MED ORDER — FERROUS SULFATE 325 (65 FE) MG PO TABS
325.0000 mg | ORAL_TABLET | Freq: Three times a day (TID) | ORAL | Status: DC
  Administered 2013-04-14 – 2013-04-23 (×26): 325 mg via ORAL
  Filled 2013-04-14 (×29): qty 1

## 2013-04-14 MED ORDER — VITAMIN D3 25 MCG (1000 UNIT) PO TABS
2000.0000 [IU] | ORAL_TABLET | Freq: Every day | ORAL | Status: DC
  Administered 2013-04-15 – 2013-04-23 (×9): 2000 [IU] via ORAL
  Filled 2013-04-14 (×10): qty 2

## 2013-04-14 MED ORDER — ONDANSETRON HCL 4 MG/2ML IJ SOLN: 4.0000 mg | Freq: Four times a day (QID) | INTRAMUSCULAR | Status: DC | PRN

## 2013-04-14 MED ORDER — SORBITOL 70 % SOLN: 30.0000 mL | Freq: Every day | Status: DC | PRN

## 2013-04-14 MED ORDER — DOCUSATE SODIUM 100 MG PO CAPS
100.0000 mg | ORAL_CAPSULE | Freq: Two times a day (BID) | ORAL | Status: DC
  Administered 2013-04-14: 100 mg via ORAL
  Filled 2013-04-14 (×4): qty 1

## 2013-04-14 MED ORDER — ENOXAPARIN SODIUM 40 MG/0.4ML ~~LOC~~ SOLN: 40.0000 mg | SUBCUTANEOUS | Status: DC

## 2013-04-14 MED ORDER — ATORVASTATIN CALCIUM 20 MG PO TABS
20.0000 mg | ORAL_TABLET | Freq: Every day | ORAL | Status: DC
  Administered 2013-04-15 – 2013-04-23 (×9): 20 mg via ORAL
  Filled 2013-04-14 (×11): qty 1

## 2013-04-14 MED ORDER — SELEGILINE HCL 5 MG PO TABS: 5.0000 mg | ORAL_TABLET | Freq: Two times a day (BID) | ORAL | Status: DC

## 2013-04-14 MED ORDER — ENOXAPARIN SODIUM 40 MG/0.4ML ~~LOC~~ SOLN
40.0000 mg | SUBCUTANEOUS | Status: DC
  Administered 2013-04-15 – 2013-04-22 (×8): 40 mg via SUBCUTANEOUS
  Filled 2013-04-14 (×9): qty 0.4

## 2013-04-14 NOTE — Progress Notes (Signed)
Patient has been approved for inpt rehab and bed is available today. I will arrange. [phone]

## 2013-04-14 NOTE — Progress Notes (Signed)
Physical Therapy Treatment Patient Details Name: OBEDIAH WELLES MRN: [id number] DOB: 06-01-1928 Today's Date: 04/14/2013 Time: 1191-4782 PT Time Calculation (min): 23 min  PT Assessment / Plan / Recommendation  History of Present Illness Patient is an 79 year old male with history of hypertension, hyperlipidemia, autonomic dysfunction from Parkinson's disease, orthostatic hypotension accident he lost his balance this morning in the kitchen. He has history of frequent falls. He landed on his right hip and was unable to get up or walk due to right hip pain. Patient only has pain when he moves his right hip. X-ray of his right hip showed a displaced right femoral subcapital fracture. Orthopedics was consulted in the ER and he scheduled to have surgery today. Now s/p R hip hemiarthroplasty   PT Comments   Pt cont's to make progress with mobility.  Increased ambulation distance & required decreased assist however cont to feel pt would strongly benefit from CIR to maximize strength, balance, safety, & independence with mobility prior to returning home with wife.  Pt very motivated!!  Follow Up Recommendations  CIR     Does the patient have the potential to tolerate intense rehabilitation     Barriers to Discharge        Equipment Recommendations       Recommendations for Other Services    Frequency Min 5X/week   Progress towards PT Goals Progress towards PT goals: Progressing toward goals  Plan Current plan remains appropriate    Precautions / Restrictions Precautions Precautions: Posterior Hip;Fall Precaution Comments: Reviewed posterior hip precautions.  Restrictions Weight Bearing Restrictions: Yes RLE Weight Bearing: Weight bearing as tolerated   Pertinent Vitals/Pain 6/10 Rt hip at beginning of session but then 2/10 at end of session.  Repositioned for comfort.       Mobility  Bed Mobility Bed Mobility: Supine to Sit;Sitting - Scoot to Edge of Bed Supine to Sit: 4: Min  assist;HOB flat;With rails Sitting - Scoot to Edge of Bed: 4: Min guard Details for Bed Mobility Assistance: cues for technique & hand placement.  (A) provided via pt grasping therapists' hand to pull shoulders/trunk to sitting upright.  Pt moved LLE independently.  Transfers Transfers: Sit to Stand;Stand to Sit Sit to Stand: 3: Mod assist;With upper extremity assist;From bed Stand to Sit: 4: Min assist;With upper extremity assist;With armrests;To chair/3-in-1 Details for Transfer Assistance: cues to reinforce hand placement & technique.  (A) to achieve standing, shift weight anteriorly over BOS, balance, & controlled descent.    Ambulation/Gait Ambulation/Gait Assistance: 4: Min assist (2nd person following with recliner) Ambulation Distance (Feet): 60 Feet Assistive device: Rolling walker Ambulation/Gait Assistance Details: cues for sequencing, RW advancement, & posture.  (A) for balance, & (A) to advance RW intially but then able to do so without assist.   Gait Pattern: Step-through pattern;Decreased weight shift to right;Decreased step length - left;Decreased hip/knee flexion - right;Decreased hip/knee flexion - left;Trunk flexed Gait velocity: slow General Gait Details: balance improving.   Stairs: No    Exercises Total Joint Exercises Ankle Circles/Pumps: AROM;Both;15 reps Heel Slides: AAROM;Strengthening;Right;15 reps Hip ABduction/ADduction: AAROM;Strengthening;Right;15 reps     PT Goals (current goals can now be found in the care plan section) Acute Rehab PT Goals PT Goal Formulation: With patient Time For Goal Achievement: 04/17/13 Potential to Achieve Goals: Good  Visit Information  Last PT Received On: 04/14/13 Assistance Needed: +1 History of Present Illness: Patient is an 79 year old male with history of hypertension, hyperlipidemia, autonomic dysfunction from Parkinson's disease, orthostatic hypotension accident he  lost his balance this morning in the kitchen. He has  history of frequent falls. He landed on his right hip and was unable to get up or walk due to right hip pain. Patient only has pain when he moves his right hip. X-ray of his right hip showed a displaced right femoral subcapital fracture. Orthopedics was consulted in the ER and he scheduled to have surgery today. Now s/p R hip hemiarthroplasty    Subjective Data      Cognition  Cognition Arousal/Alertness: Awake/alert Behavior During Therapy: WFL for tasks assessed/performed Overall Cognitive Status: Within Functional Limits for tasks assessed    Balance     End of Session PT - End of Session Equipment Utilized During Treatment: Gait belt;Cervical collar (cervical collar for support & comfort.  ) Activity Tolerance: Patient tolerated treatment well Patient left: in chair;with call bell/phone within reach;with family/visitor present Nurse Communication: Mobility status   GP     Lara Mulch 04/14/2013, 11:10 AM  Verdell Face, PTA [phone] 04/14/2013

## 2013-04-14 NOTE — Progress Notes (Addendum)
[facility]                                                                                Patient Demographics  Ivan Clark, is a 79 y.o. male, DOB - 1928/11/03, ZOX:[id number]  Admit date - 04/09/2013   Admitting Physician Ripudeep Jenna Luo, MD  Outpatient Primary MD for the patient is  Duane Lope, MD  LOS - 5   Chief Complaint  Patient presents with  . Fall  . Hip Pain    Right hip        Assessment & Plan  Principal Problem:   Right femoral fracture Active Problems:   Parkinsonism   Hypertension   Hyperlipidemia   BPH (benign prostatic hypertrophy)   Closed right hip fracture   Hip fracture, right   Femoral neck fracture  Right femoral fracture  - s/p right hemiarthroplasty.  - pain control and PT evaluated and recommended CIR.  - outpatient follow up with orthopedics.   Parkinsonism  - Continue Sinemet and selegiline   Hypertension:  - better controlled.   Hyperlipidemia  - continue with lipitor on discharge.   BPH (benign prostatic hypertrophy)  - Continue Flomax, if needed, his urologist is Dr. Jerilee Field   Orthostatic hypotension/autonomic dysfunction  - Will continue Florinef   Dysphagia:  - Patient had an outpatient swallowing study done on 03/25/2013, he should be on dysphagia 3, thin liquids   Code Status: Full  Family Communication: None at this time.  Disposition Plan: Patient is stable for discharge. Please see discharge summary conducted on 04/12/2013 by Dr. Blake Divine.  Follow up with primary care physician once discharged from Inpatient Rehab.  Patient will also need to follow up with orthopedics at the specified time.  He should continue physical and occupational therapy as set by the inpatient rehab.  Procedures  Right hip hemiarthroplasty using diffuse trial  lock prosthesis with monopolar head.  Consults   Orthopedics, Dr. [name]  DVT Prophylaxis  Lovenox   Lab Results  Component Value Date   PLT 201 04/12/2013     Medications  Scheduled Meds: . amLODipine  10 mg Oral Daily  . aspirin EC  81 mg Oral QPC supper  . atorvastatin  20 mg Oral Daily  . B-complex with vitamin C  1 tablet Oral Daily  . carbidopa-levodopa  1 tablet Oral QHS  . carbidopa-levodopa  1 tablet Oral TID WC  . cholecalciferol  2,000 Units Oral Daily  . docusate sodium  100 mg Oral BID  . dutasteride  0.5 mg Oral QPC supper  . enoxaparin (LOVENOX) injection  40 mg Subcutaneous Q24H  . feeding supplement (ENSURE COMPLETE)  237 mL Oral BID BM  . fentaNYL  50-100 mcg Intravenous Once  . ferrous sulfate  325 mg Oral TID PC  . fludrocortisone  0.1 mg Oral Daily  . irbesartan  150 mg Oral BID  . multivitamin with minerals  1 tablet Oral Daily  . pantoprazole  40 mg Oral Q0600  . pneumococcal 23 valent vaccine  0.5 mL Intramuscular Tomorrow-1000  . polyethylene glycol  17 g Oral BID  . selegiline  5 mg Oral BID WC  . tamsulosin  0.4 mg Oral QPC supper   Continuous Infusions: . lactated ringers 20 mL/hr at 04/12/13 0250   PRN Meds:.acetaminophen, acetaminophen, alum & mag hydroxide-simeth, bisacodyl, HYDROcodone-acetaminophen, HYDROmorphone (DILAUDID) injection, menthol-cetylpyridinium, methocarbamol (ROBAXIN) IV, methocarbamol, metoCLOPramide (REGLAN) injection, metoCLOPramide, midazolam, morphine injection, ondansetron (ZOFRAN) IV, ondansetron, phenol  Antibiotics    Anti-infectives   Start     Dose/Rate Route Frequency Ordered Stop   04/09/13 2100  ceFAZolin (ANCEF) IVPB 2 g/50 mL premix     2 g 100 mL/hr over 30 Minutes Intravenous Every 6 hours 04/09/13 1745 04/10/13 0400   04/09/13 1457  ceFAZolin (ANCEF) 2-3 GM-% IVPB SOLR    Comments:  Alanda Amass   : cabinet override      04/09/13 1457 04/09/13 1518       Time Spent in minutes   20 minutes   Violetta Lavalle D.O. on 04/14/2013 at 8:31 AM  Between 7am to 7pm - Pager - [phone]  After 7pm go to [email or web address] - password [email or web address]  And look for the night  coverage person covering for me after hours  [facility] Office  [phone]    Subjective:   Ivan Clark seen and examined today.  Patient has no complaints at this time.  Patient states he would really like to go to inpatient rehab. Patient denies dizziness, chest pain, shortness of breath, abdominal pain, N/V/D/C, new weakness, numbess, tingling.    Objective:   Filed Vitals:   04/13/13 1242 04/13/13 2000 04/13/13 2104 04/14/13 0546  BP: 148/61  173/65 166/65  Pulse: 85  83 78  Temp: 98.9 F (37.2 C)  98.7 F (37.1 C) 98 F (36.7 C)  TempSrc:      Resp: 18 18 16 16   Height:      Weight:      SpO2: 93% 95% 93% 93%    Wt Readings from Last 3 Encounters:  04/09/13 77.111 kg (170 lb)  04/09/13 77.111 kg (170 lb)  03/08/13 76.204 kg (168 lb)     Intake/Output Summary (Last 24 hours) at 04/14/13 0831 Last data filed at 04/14/13 2956  Gross per 24 hour  Intake    600 ml  Output    200 ml  Net    400 ml    Exam  General: Well developed, well nourished, NAD, appears stated age  HEENT: NCAT, mucous membranes moist.   Neck: Supple, no JVD, no masses  Cardiovascular: S1 S2 auscultated, no rubs, murmurs or gallops. Regular rate and rhythm.  Respiratory: Clear to auscultation bilaterally with equal chest rise  Abdomen: Soft, nontender, nondistended, + bowel sounds  Extremities: warm dry without cyanosis clubbing or edema  Neuro: AAOx3, cranial nerves grossly intact.   Psych: Normal affect and demeanor with intact judgement and insight  Data Review   Micro Results No results found for this or any previous visit (from the past 240 hour(s)).  Radiology Reports Dg Chest 1 View  04/09/2013   CLINICAL DATA:  Fall and hip pain.  EXAM: CHEST - 1 VIEW  COMPARISON:  03/01/2013  FINDINGS: The lungs are clear bilaterally. Stable appearance of the heart and mediastinum. The trachea is midline. No evidence for a pneumothorax. Bony thorax is intact.   IMPRESSION: No acute cardiopulmonary disease.   Electronically Signed   By: Richarda Overlie M.D.   On: 04/09/2013 11:28   Dg Hip Complete Right  04/09/2013   CLINICAL DATA:  Fall and right hip pain.  EXAM: RIGHT HIP - COMPLETE 2+ VIEW  COMPARISON:  None.  FINDINGS: There is a mildly displaced right subcapital femoral fracture. The right femoral trochanteric region is mildly displaced superiorly. The right femoral head is located. The pelvic bony ring is intact. No other definite fractures.  IMPRESSION: Displaced right femoral subcapital fracture.   Electronically Signed   By: Richarda Overlie M.D.   On: 04/09/2013 11:21   Dg Pelvis Portable  04/09/2013   CLINICAL DATA:  Postop  EXAM: PORTABLE PELVIS 1-2 VIEWS  COMPARISON:  1030 hr  FINDINGS: Right hip hemiarthroplasty has been placed. Anatomic alignment of the osseous and prostatic structures. No breakage or loosening of the hardware.  IMPRESSION: Right hip hemiarthroplasty anatomically aligned.   Electronically Signed   By: Maryclare Bean M.D.   On: 04/09/2013 17:35   Dg Swallowing Func-speech Pathology  03/25/2013   Riley Nearing Deblois, CCC-SLP     03/25/2013  1:45 PM Objective Swallowing Evaluation: Modified Barium Swallowing Study   Patient Details  Name: SHANNEN VERNON MRN: [id number] Date of Birth: 06/22/1928  Today's Date: 03/25/2013 Time: 1140-1205 SLP Time Calculation (min): 25 min  Past Medical History:  Past Medical History  Diagnosis Date  . Hypertension   . Hyperlipidemia   . Aneurysm of infrarenal abdominal aorta SUPRAILIAC  4CM PER CT  10-20-2010--  STABLE  . Bladder cancer RECURRENT  (FIRST DX  2001)    HX TURBT AND CHEMO BLADDER INSTILLATION TX'S  . Parkinson disease   . Frequency of urination   . Nocturia   . Orthostatic hypotension    Past Surgical History:  Past Surgical History  Procedure Laterality Date  . Tonsillectomy and adenoidectomy  1938  . Left inguinal hernia repair  1996  . Transurethral resection of bladder tumor  09-11-1999     BLADDER CANCER  . Cysto/ retrograde pyelogram/ bladder bx's  07-03-2004  . Cysto/ bladder bx/ fulgeration bladder tumor  10-09-2004  . Appendectomy  10-20-2010  . Cystoscopy with biopsy  11/28/2011    Procedure: CYSTOSCOPY WITH BIOPSY;  Surgeon: Antony Haste, MD;  Location: [facility];  Service:  Urology;  Laterality: N/A;  . Vasectomy     HPI:  Pt is an 64 year olf male arriving for a follow up MBS after an  admission in 10/21. Pt has dx of Parkinsons, underwent botox  injections for neck spasms resulting in dysphagia. Pt had MBS  10/22 recommended puree and honey thick liquids due to severe  residuals and aspiration of residuals. Since d/c pt has been  drinking nectar thick liquids and some thin liquids and soft  solids. He feels that he has improved significantly. He has not  had any f/u with SLP.      Assessment / Plan / Recommendation Clinical Impression  Dysphagia Diagnosis: Mild pharyngeal phase dysphagia Clinical impression: Pt demonstrates improvement in quantity of  residuals present post swallow. Forward head posture and weakness  of hyolaryngeal mechanism still result in moderate pharyngeal  residuals, though they are no longer significant enough to  penetrate airway post swallow. With min verbal cues, pt was able  to successfully follow each moderately sized sip with an  immediate second swallow to clear residue. An occasional throat  clear is also suggested to further protect airway. Recommend pt  consume moist solids (mechanical soft texture) with thin liquids  with careful adherance to strategies. Pt should continue to take  pills in puree. No SLP f/u needed unless there is a significant  change in function/medical status. Discussed this with  wife.     Treatment Recommendation  No treatment recommended at this time    Diet Recommendation Dysphagia 3 (Mechanical Soft);Thin liquid   Liquid Administration via: Cup Medication Administration: Whole meds with puree Supervision:  Patient able to self feed;Intermittent supervision  to cue for compensatory strategies Compensations: Slow rate;Small sips/bites;Multiple dry swallows  after each bite/sip;Clear throat intermittently Postural Changes and/or Swallow Maneuvers: Seated upright 90  degrees    Other  Recommendations     Follow Up Recommendations  None    Frequency and Duration        Pertinent Vitals/Pain NA    SLP Swallow Goals     General HPI: Pt is an 71 year olf male arriving for a follow up  MBS after an admission in 10/21. Pt has dx of Parkinsons,  underwent botox injections for neck spasms resulting in  dysphagia. Pt had MBS 10/22 recommended puree and honey thick  liquids due to severe residuals and sensed aspiration of  residuals. Since d/c pt has been drinking nectar thick liquids  and some thin liquids and soft solids. He feels that he has  improved significantly. He has not had any f/u with SLP.  Type of Study: Modified Barium Swallowing Study Reason for Referral: Objectively evaluate swallowing function Diet Prior to this Study: Thin liquids;Dysphagia 3 (soft) Temperature Spikes Noted: N/A Respiratory Status: Room air History of Recent Intubation: No Behavior/Cognition: Alert;Cooperative;Pleasant mood Oral Cavity - Dentition: Missing dentition;Adequate natural  dentition (missing back dentition) Oral Motor / Sensory Function: Within functional limits Self-Feeding Abilities: Able to feed self Patient Positioning: Upright in chair Baseline Vocal Quality: Clear Volitional Cough: Strong Volitional Swallow: Able to elicit Anatomy:  (forward head posture) Pharyngeal Secretions: Not observed secondary MBS    Reason for Referral Objectively evaluate swallowing function   Oral Phase Oral Preparation/Oral Phase Oral Phase: Impaired Oral Phase - Comment Oral Phase - Comment: mild oral residuals, cleared with second  swallow   Pharyngeal Phase Pharyngeal Phase Pharyngeal Phase: Impaired Pharyngeal - Thin Pharyngeal - Thin Cup: Reduced  pharyngeal peristalsis;Reduced  anterior laryngeal mobility;Reduced laryngeal elevation;Reduced  tongue base retraction;Penetration/Aspiration after  swallow;Pharyngeal residue - valleculae;Pharyngeal residue -  pyriform sinuses Penetration/Aspiration details (thin cup): Material enters  airway, remains ABOVE vocal cords and not ejected out;Material  does not enter airway Pharyngeal - Solids Pharyngeal - Puree: Reduced pharyngeal peristalsis;Reduced  anterior laryngeal mobility;Reduced laryngeal elevation;Reduced  tongue base retraction;Pharyngeal residue - valleculae;Pharyngeal  residue - pyriform sinuses Penetration/Aspiration details (puree): Material does not enter  airway Pharyngeal - Regular: Reduced pharyngeal peristalsis;Reduced  anterior laryngeal mobility;Reduced laryngeal elevation;Reduced  tongue base retraction;Pharyngeal residue - valleculae;Pharyngeal  residue - pyriform sinuses Penetration/Aspiration details (regular): Material does not enter  airway Pharyngeal - Pill: Reduced pharyngeal peristalsis;Reduced  anterior laryngeal mobility;Reduced laryngeal elevation;Reduced  tongue base retraction;Pharyngeal residue - valleculae;Pharyngeal  residue - pyriform sinuses;Penetration/Aspiration during  swallow;Trace aspiration  Cervical Esophageal Phase    GO    Cervical Esophageal Phase Cervical Esophageal Phase: [facility]    Functional Assessment Tool Used: clinical judgement Functional Limitations: Swallowing Swallow Current Status ([id number]): At least 1 percent but less than  20 percent impaired, limited or restricted Swallow Goal Status [phone]): At least 1 percent but less than 20  percent impaired, limited or restricted Swallow Discharge Status [phone]): At least 1 percent but less  than 20 percent impaired, limited or restricted   [facility], [place] CCC-SLP [phone]  Claudine Mouton 03/25/2013, 1:42 PM     CBC  Recent Labs Lab  04/09/13 0944 04/10/13 0605 04/11/13 0855 04/12/13 0435  WBC 8.3  12.5* 12.8* 11.6*  HGB 14.0 11.4* 12.0* 10.3*  HCT 39.8 32.9* 34.6* 30.8*  PLT 222 211 181 201  MCV 91.1 90.9 90.8 91.7  MCH 32.0 31.5 31.5 30.7  MCHC 35.2 34.7 34.7 33.4  RDW 12.9 13.2 13.2 13.1  LYMPHSABS 1.6  --   --   --   MONOABS 0.6  --   --   --   EOSABS 0.2  --   --   --   BASOSABS 0.0  --   --   --     Chemistries   Recent Labs Lab 04/09/13 0944 04/10/13 0605 04/11/13 0855  NA 140 135 133*  K 3.8 4.0 3.6  CL 101 100 99  CO2 27 23 24   GLUCOSE 105* 125* 164*  BUN 23 24* 19  CREATININE 0.92 0.99 0.84  CALCIUM 9.3 8.4 8.5   ------------------------------------------------------------------------------------------------------------------ estimated creatinine clearance is 66.3 ml/min (by C-G formula based on Cr of 0.84). ------------------------------------------------------------------------------------------------------------------ No results found for this basename: HGBA1C,  in the last 72 hours ------------------------------------------------------------------------------------------------------------------ No results found for this basename: CHOL, HDL, LDLCALC, TRIG, CHOLHDL, LDLDIRECT,  in the last 72 hours ------------------------------------------------------------------------------------------------------------------ No results found for this basename: TSH, T4TOTAL, FREET3, T3FREE, THYROIDAB,  in the last 72 hours ------------------------------------------------------------------------------------------------------------------ No results found for this basename: VITAMINB12, FOLATE, FERRITIN, TIBC, IRON, RETICCTPCT,  in the last 72 hours  Coagulation profile  Recent Labs Lab 04/09/13 0944  INR 1.04    No results found for this basename: DDIMER,  in the last 72 hours  Cardiac Enzymes No results found for this basename: CK, CKMB, TROPONINI, MYOGLOBIN,  in the last 168  hours ------------------------------------------------------------------------------------------------------------------ No components found with this basename: POCBNP,

## 2013-04-14 NOTE — Progress Notes (Signed)
Pt arrived to unit at 1540 with family at bedside.  Reviewed rehab process and booklet with pt and family with verbal understanding.  Pt aware of safety plan with no further questions at this time. Pt in bed call bell within reach, family at bedside.

## 2013-04-14 NOTE — PMR Pre-admission (Signed)
PMR Admission Coordinator Pre-Admission Assessment  Patient: Ivan Clark is an 79 y.o., male MRN: [id number] DOB: 04-30-1929 Height: 5\' 6"  (167.6 cm) Weight: 77.111 kg (170 lb)              Insurance Information HMO: yes    PPO:      PCP:      IPA:      80/20:      OTHER: medicare advantage plan PRIMARY: Blue Medicare      Policy#: [id number]      Subscriber: pt CM Name: Armen Pickup      Phone#: [phone]     Fax#: [phone] Pre-Cert#: [id number]      Employer: retired Benefits:  Phone #: [phone]     Name: 12/4 Eff. Date: 05/12/12     Deduct: none      Out of Pocket Max: $3400      Life Max: none CIR: $170 per day days 1-7      SNF: no copay days 1 -10; $50 per day days 11-100 Outpatient: $35 per visit     Co-Pay: no visit limit Home Health: 100%      Co-Pay: none DME: 80%     Co-Pay: 20% Providers: in network  SECONDARY: none        Emergency Contact Information Contact Information   Name Relation Home Work Mobile   [place] Spouse [phone]  [phone]   Sarita Haver Daughter [phone]  [phone]     Current Medical History  Patient Admitting Diagnosis: Right femoral neck fracture after fall, hx of PD  History of Present Illness: [name] is a 78 y.o. right-handed male with history of orthostatic hypotension as well as autonomic dysfunction from Parkinson's disease. Patient independent prior to admission at times using a walker. Admitted 04/09/2013 after a fall when he lost his balance landing on his right hip without loss of consciousness. X-rays and imaging revealed a displaced right femoral subcapital fracture. Underwent right hip hemiarthroplasty 04/09/2013 per Dr. [name]. Weightbearing as tolerated with posterior hip precautions. Postoperative pain management. Subcutaneous Lovenox for DVT prophylaxis.   Past Medical History  Past Medical History  Diagnosis Date  . Hypertension   . Hyperlipidemia   . Aneurysm of infrarenal abdominal  aorta SUPRAILIAC  4CM PER CT 10-20-2010--  STABLE  . Bladder cancer RECURRENT  (FIRST DX  2001)    HX TURBT AND CHEMO BLADDER INSTILLATION TX'S  . Parkinson disease   . Frequency of urination   . Nocturia   . Orthostatic hypotension   . Peripheral neuropathy     Family History  family history includes Alzheimer's disease in his mother; Heart failure in his father.  Prior Rehab/Hospitalizations: none  Current Medications  Current facility-administered medications:acetaminophen (TYLENOL) suppository 650 mg, 650 mg, Rectal, Q6H PRN, Verlee Rossetti, MD;  acetaminophen (TYLENOL) tablet 650 mg, 650 mg, Oral, Q6H PRN, Verlee Rossetti, MD, 650 mg at 04/10/13 1017;  alum & mag hydroxide-simeth (MAALOX/MYLANTA) 200-200-20 MG/5ML suspension 15 mL, 15 mL, Oral, Q4H PRN, Kathlen Mody, MD, 15 mL at 04/11/13 1725 amLODipine (NORVASC) tablet 10 mg, 10 mg, Oral, Daily, Kathlen Mody, MD, 10 mg at 04/14/13 1011;  aspirin EC tablet 81 mg, 81 mg, Oral, QPC supper, Ripudeep K Rai, MD, 81 mg at 04/13/13 1727;  atorvastatin (LIPITOR) tablet 20 mg, 20 mg, Oral, Daily, Ripudeep K Rai, MD, 20 mg at 04/14/13 1011;  B-complex with vitamin C tablet 1 tablet, 1 tablet, Oral, Daily, Ripudeep Jenna Luo, MD, 1 tablet  at 04/14/13 1011 bisacodyl (DULCOLAX) suppository 10 mg, 10 mg, Rectal, Daily PRN, Verlee Rossetti, MD, 10 mg at 04/13/13 1645;  carbidopa-levodopa (SINEMET CR) 50-200 MG per tablet controlled release 1 tablet, 1 tablet, Oral, QHS, Ripudeep K Rai, MD, 1 tablet at 04/13/13 2156;  carbidopa-levodopa (SINEMET IR) 25-100 MG per tablet immediate release 1 tablet, 1 tablet, Oral, TID WC, Ripudeep Jenna Luo, MD, 1 tablet at 04/14/13 1139 cholecalciferol (VITAMIN D) tablet 2,000 Units, 2,000 Units, Oral, Daily, Ripudeep K Rai, MD, 2,000 Units at 04/14/13 1011;  docusate sodium (COLACE) capsule 100 mg, 100 mg, Oral, BID, Ripudeep K Rai, MD, 100 mg at 04/14/13 1011;  dutasteride (AVODART) capsule 0.5 mg, 0.5 mg, Oral, QPC supper,  Ripudeep K Rai, MD, 0.5 mg at 04/13/13 1727;  enoxaparin (LOVENOX) injection 40 mg, 40 mg, Subcutaneous, Q24H, Verlee Rossetti, MD, 40 mg at 04/14/13 1139 feeding supplement (ENSURE COMPLETE) (ENSURE COMPLETE) liquid 237 mL, 237 mL, Oral, BID BM, Ailene Ards, RD, 237 mL at 04/14/13 1011;  fentaNYL (SUBLIMAZE) injection 50-100 mcg, 50-100 mcg, Intravenous, Once, Bedelia Person, MD;  ferrous sulfate tablet 325 mg, 325 mg, Oral, TID PC, Verlee Rossetti, MD, 325 mg at 04/14/13 1138;  fludrocortisone (FLORINEF) tablet 0.1 mg, 0.1 mg, Oral, Daily, Ripudeep K Rai, MD, 0.1 mg at 04/14/13 1011 HYDROcodone-acetaminophen (NORCO/VICODIN) 5-325 MG per tablet 1-2 tablet, 1-2 tablet, Oral, Q6H PRN, Verlee Rossetti, MD, 2 tablet at 04/11/13 1610;  HYDROmorphone (DILAUDID) injection 0.5 mg, 0.5 mg, Intravenous, Q3H PRN, Ripudeep K Rai, MD, 0.5 mg at 04/10/13 1314;  irbesartan (AVAPRO) tablet 150 mg, 150 mg, Oral, BID, Ripudeep K Rai, MD, 150 mg at 04/14/13 1011 lactated ringers infusion, , Intravenous, Continuous, Bedelia Person, MD, Last Rate: 20 mL/hr at 04/12/13 0250;  menthol-cetylpyridinium (CEPACOL) lozenge 3 mg, 1 lozenge, Oral, PRN, Verlee Rossetti, MD;  methocarbamol (ROBAXIN) 500 mg in dextrose 5 % 50 mL IVPB, 500 mg, Intravenous, Q6H PRN, Ripudeep K Rai, MD;  methocarbamol (ROBAXIN) tablet 500 mg, 500 mg, Oral, Q6H PRN, Ripudeep K Rai, MD metoCLOPramide (REGLAN) injection 5-10 mg, 5-10 mg, Intravenous, Q8H PRN, Verlee Rossetti, MD;  metoCLOPramide (REGLAN) tablet 5-10 mg, 5-10 mg, Oral, Q8H PRN, Verlee Rossetti, MD;  midazolam (VERSED) injection 1-2 mg, 1-2 mg, Intravenous, PRN, Bedelia Person, MD;  morphine 2 MG/ML injection 0.5 mg, 0.5 mg, Intravenous, Q2H PRN, Verlee Rossetti, MD multivitamin with minerals tablet 1 tablet, 1 tablet, Oral, Daily, Ripudeep K Rai, MD, 1 tablet at 04/14/13 1011;  ondansetron [facility]) injection 4 mg, 4 mg, Intravenous, Q6H PRN, Verlee Rossetti, MD, 4 mg at 04/09/13 2029;  ondansetron  (ZOFRAN) tablet 4 mg, 4 mg, Oral, Q6H PRN, Verlee Rossetti, MD;  pantoprazole (PROTONIX) EC tablet 40 mg, 40 mg, Oral, Q0600, Kathlen Mody, MD, 40 mg at 04/14/13 0620 phenol (CHLORASEPTIC) mouth spray 1 spray, 1 spray, Mouth/Throat, PRN, Verlee Rossetti, MD;  polyethylene glycol (MIRALAX / GLYCOLAX) packet 17 g, 17 g, Oral, BID, Kathlen Mody, MD, 17 g at 04/13/13 1022;  selegiline (ELDEPRYL) tablet 5 mg, 5 mg, Oral, BID WC, Maryann Mikhail, DO, 5 mg at 04/14/13 1138;  tamsulosin (FLOMAX) capsule 0.4 mg, 0.4 mg, Oral, QPC supper, Ripudeep K Rai, MD, 0.4 mg at 04/13/13 1727 Facility-Administered Medications Ordered in Other Encounters: mitomycin (MUTAMYCIN) chemo injection 40 mg, 40 mg, Bladder Instillation, Once, Antony Haste, MD  Patients Current Diet: Dysphagia 3 diet with thin liquids. Wears coft cervical collar due to Parkinsons  Precautions /  Restrictions Precautions Precautions: Posterior Hip;Fall Precaution Booklet Issued: Yes (comment) Precaution Comments: Reviewed posterior hip precautions.  Restrictions Weight Bearing Restrictions: Yes RLE Weight Bearing: Weight bearing as tolerated   Prior Activity Level   Home Assistive Devices / Equipment Home Assistive Devices/Equipment: Walker (specify type);Shower chair with back Home Equipment: Walker - 2 wheels  Prior Functional Level Prior Function Level of Independence: Needs assistance ADL's / Homemaking Assistance Needed: wife helps tie shoes and performs all homemaking tasks.    Current Functional Level Cognition  Overall Cognitive Status: Within Functional Limits for tasks assessed Orientation Level: Oriented X4    Extremity Assessment (includes Sensation/Coordination)          ADLs  Eating/Feeding: Set up Where Assessed - Eating/Feeding: Chair Grooming: Set up;Supervision/safety Where Assessed - Grooming: Supported sitting Upper Body Bathing: Set up;Supervision/safety Where Assessed - Upper Body Bathing:  Supported sitting Lower Body Bathing: Maximal assistance Where Assessed - Lower Body Bathing: Supported sitting Upper Body Dressing: Moderate assistance Where Assessed - Upper Body Dressing: Supported sitting Lower Body Dressing: Maximal assistance (with AE) Where Assessed - Lower Body Dressing: Unsupported sitting Toilet Transfer: Moderate assistance Toilet Transfer Method: Sit to Barista:  (Bed(raised)>8 steps forward>sit in recliner behind him) Equipment Used: Reacher;Sock aid Transfers/Ambulation Related to ADLs: Mod A sit>stand from raised bed; Min A stand>sit to recliner; min A for ambulation with RW with max VCs for sequencing and not stepping too far into walker ADL Comments: Introduced AE and had pt practice doffing/donning socks and donning/doffing underwear. Made pt and family aware where they could get AE.    Mobility  Bed Mobility: Supine to Sit;Sitting - Scoot to Edge of Bed Rolling Right: 3: Mod assist;With rail Rolling Left: 4: Min assist;With rail (pillow between knees) Supine to Sit: 4: Min assist;HOB flat;With rails Sitting - Scoot to Edge of Bed: 4: Min guard    Transfers  Transfers: Sit to Stand;Stand to Sit Sit to Stand: 3: Mod assist;With upper extremity assist;From bed Stand to Sit: 4: Min assist;With upper extremity assist;With armrests;To chair/3-in-1 Stand Pivot Transfers: 3: Mod assist;2: Max assist    Ambulation / Gait / Stairs / Psychologist, prison and probation services  Ambulation/Gait Ambulation/Gait Assistance: 4: Min assist (2nd person following with recliner) Ambulation/Gait: Patient Percentage: 70% Ambulation Distance (Feet): 60 Feet Assistive device: Rolling walker Ambulation/Gait Assistance Details: cues for sequencing, RW advancement, & posture.  (A) for balance, & (A) to advance RW intially but then able to do so without assist.   Gait Pattern: Step-through pattern;Decreased weight shift to right;Decreased step length - left;Decreased  hip/knee flexion - right;Decreased hip/knee flexion - left;Trunk flexed Gait velocity: slow General Gait Details: balance improving.   Stairs: No Corporate treasurer: No    Posture / Balance      Special needs/care consideration Bowel mgmt:continent Bladder mgmt:continent   Previous Home Environment Living Arrangements: Spouse/significant other  Lives With: Spouse Available Help at Discharge: Family;Available 24 hours/day Type of Home: House Home Layout: One level Home Access: Stairs to enter Entrance Stairs-Rails: Right Entrance Stairs-Number of Steps: 5 Bathroom Shower/Tub: Walk-in shower;Door [employer] Toilet: Standard Bathroom Accessibility: Yes How Accessible: Accessible via walker Home Care Services: No Additional Comments: Has done op therapy before  Discharge Living Setting Plans for Discharge Living Setting: Patient's home;Lives with (comment);Other (Comment) (spouse) Type of Home at Discharge: House Discharge Home Layout: One level Discharge Home Access: Stairs to enter Entrance Stairs-Rails: Right Entrance Stairs-Number of Steps: 5 Discharge Bathroom Shower/Tub: Walk-in shower Discharge Bathroom Toilet:  Standard Discharge Bathroom Accessibility: Yes How Accessible: Accessible via walker Does the patient have any problems obtaining your medications?: No  Social/Family/Support Systems Patient Roles: Spouse;Parent Contact Information: Abner Ardis, wife Anticipated Caregiver: wife and children Anticipated Caregiver's Contact Information: see above Ability/Limitations of Caregiver: min assist level Caregiver Availability: 24/7 Discharge Plan Discussed with Primary Caregiver: Yes Is Caregiver In Agreement with Plan?: Yes Does Caregiver/Family have Issues with Lodging/Transportation while Pt is in Rehab?: No    Goals/Additional Needs Patient/Family Goal for Rehab: supervision with PT, supervision to min assist OT, supervision  SLP Expected length of stay: ELOS 10 to 14 days Dietary Needs: D3 with thin liquids, wears soft collar to neck due to Parkinsons   Decrease burden of Care through IP rehab admission: n/a  Possible need for SNF placement upon discharge:n/a  Patient Condition: This patient's medical and functional status has changed since the consult dated: 04/11/13 in which the Rehabilitation Physician determined and documented that the patient's condition is appropriate for intensive rehabilitative care in an inpatient rehabilitation facility. See "History of Present Illness" (above) for medical update. Functional changes are: min to moda assist. Patient's medical and functional status update has been discussed with the Rehabilitation physician and patient remains appropriate for inpatient rehabilitation. Will admit to inpatient rehab today.  Preadmission Screen Completed By:  [name], 04/14/2013 2:22 PM ______________________________________________________________________   Discussed status with Dr. Wynn Banker on 04/14/13 at  1421 and received telephone approval for admission today.  Admission Coordinator:  [name], time 0865 Date 04/14/13.

## 2013-04-14 NOTE — H&P (Signed)
Physical Medicine and Rehabilitation Admission H&P  Chief Complaint   Patient presents with   .  Fall   .  Hip Pain     Right hip   :  Chief complaint: Right leg pain  HPI: Ivan Clark is a 79 y.o. right-handed male with history of orthostatic hypotension as well as autonomic dysfunction from Parkinson's disease. Patient independent prior to admission at times using a walker. Admitted 04/09/2013 after a fall when he lost his balance landing on his right hip without loss of consciousness. X-rays and imaging revealed a displaced right femoral subcapital fracture. Underwent right hip hemiarthroplasty 04/09/2013 per Dr. [name]. Weightbearing as tolerated with posterior hip precautions. Postoperative pain management. Subcutaneous Lovenox for DVT prophylaxis. Acute blood loss anemia 10.3 and monitored. He remains on Sinemet for history of Parkinson's disease. Physical and occupational therapy evaluations completed 04/10/2013 with recommendations for physical medicine rehabilitation consult to consider inpatient rehabilitation services. Patient was felt to be a good candidate for inpatient rehabilitation services and was admitted for comprehensive rehabilitation program   Try to have a bowel movement this afternoon but was not successful. Did have a bowel movement yesterday after a laxative Wife reports that constipation is a problem at home as well  Also gives a history of Botox injection for anterocollis 02/16/2013,  75 units to R and 25 units to Left SCM, developed dysphagia requiring  Temporary TF , this is improving with time  ROS Review of Systems  Gastrointestinal: Positive for constipation.  Genitourinary: Positive for dysuria.  Nocturia  Neurological: Positive for dizziness.  All other systems reviewed and are negative  Past Medical History   Diagnosis  Date   .  Hypertension    .  Hyperlipidemia    .  Aneurysm of infrarenal abdominal aorta  SUPRAILIAC 4CM PER CT 10-20-2010--  STABLE   .  Bladder cancer  RECURRENT (FIRST DX 2001)     HX TURBT AND CHEMO BLADDER INSTILLATION TX'S   .  Parkinson disease    .  Frequency of urination    .  Nocturia    .  Orthostatic hypotension    .  Peripheral neuropathy     Past Surgical History   Procedure  Laterality  Date   .  Tonsillectomy and adenoidectomy   1938   .  Left inguinal hernia repair   1996   .  Transurethral resection of bladder tumor   09-11-1999     BLADDER CANCER   .  Cysto/ retrograde pyelogram/ bladder bx's   07-03-2004   .  Cysto/ bladder bx/ fulgeration bladder tumor   10-09-2004   .  Appendectomy   10-20-2010   .  Cystoscopy with biopsy   11/28/2011     Procedure: CYSTOSCOPY WITH BIOPSY; Surgeon: Antony Haste, MD; Location: [facility]; Service: Urology; Laterality: N/A;   .  Vasectomy      Family History   Problem  Relation  Age of Onset   .  Heart failure  Father    .  Alzheimer's disease  Mother     Social History: reports that he quit smoking about 13 years ago. His smoking use included Cigarettes. He has a 100 pack-year smoking history. He has never used smokeless tobacco. He reports that he does not drink alcohol or use illicit drugs.  Allergies: No Known Allergies  Medications Prior to Admission   Medication  Sig  Dispense  Refill   .  amLODipine (NORVASC) 5 MG  tablet  Take 5 mg by mouth daily.     [name]  aspirin EC 81 MG tablet  Take 81 mg by mouth daily after supper.     [name]  atorvastatin (LIPITOR) 20 MG tablet  Take 20 mg by mouth daily.     [name]  b complex vitamins tablet  Take 1 tablet by mouth daily.     .  carbidopa-levodopa (SINEMET CR) 50-200 MG per tablet  Take 1 tablet by mouth at bedtime.     .  carbidopa-levodopa (SINEMET IR) 25-100 MG per tablet  Take 1 tablet by mouth 3 (three) times daily with meals.     .  Cholecalciferol (VITAMIN D) 2000 UNITS CAPS  Take 2,000 Units by mouth daily.     [name]  dutasteride (AVODART) 0.5 MG capsule  Take 0.5 mg by mouth daily  after supper.     .  fludrocortisone (FLORINEF) 0.1 MG tablet  Take 0.1 mg by mouth daily.     .  irbesartan (AVAPRO) 150 MG tablet  Take 150 mg by mouth 2 (two) times daily.     .  Multiple Vitamin (MULTIVITAMIN WITH MINERALS) TABS  Take 1 tablet by mouth daily.     .  selegiline (ELDEPRYL) 5 MG capsule  Take 5 mg by mouth 2 (two) times daily. One in the morning and one at noon.     .  tamsulosin (FLOMAX) 0.4 MG CAPS capsule  Take 0.4 mg by mouth daily after supper.      Home:  Home Living  Family/patient expects to be discharged to:: Private residence  Living Arrangements: Spouse/significant other  Available Help at Discharge: Family;Available 24 hours/day  Type of Home: House  Home Access: Stairs to enter  [employer] of Steps: 5  Entrance Stairs-Rails: Right  Home Layout: One level  Home Equipment: Walker - 2 wheels  Functional History:   Functional Status:  Mobility:  Bed Mobility  Bed Mobility: Supine to Sit;Sitting - Scoot to Edge of Bed  Supine to Sit: 2: Max assist;With rails;HOB elevated  Sitting - Scoot to [employer] of Bed: 3: Mod assist  Transfers  Transfers: Sit to Stand;Stand to [employer] Transfers  Sit to Stand: 2: Max assist;With upper extremity assist;From bed  Stand to Sit: 2: Max assist;With upper extremity assist;With armrests;To chair/3-in-1  Stand Pivot Transfers: 2: Max assist  Ambulation/Gait  Ambulation/Gait Assistance: Not tested (comment)  Ambulation Distance (Feet): 5 Feet  Assistive device: Rolling walker  Ambulation/Gait Assistance Details: Cues for gait sequence and posture  Gait Pattern: Step-to pattern  Gait velocity: slow   ADL:  ADL  Eating/Feeding: Set up  Where Assessed - Eating/Feeding: Chair  Grooming: Set up;Supervision/safety  Where Assessed - Grooming: Supported sitting  Upper Body Bathing: Set up;Supervision/safety  Where Assessed - Upper Body Bathing: Supported sitting  Lower Body Bathing: Maximal assistance   Where Assessed - Lower Body Bathing: Supported sitting  Upper Body Dressing: Moderate assistance  Where Assessed - Upper Body Dressing: Supported sitting  Lower Body Dressing: Maximal assistance (with AE)  Where Assessed - Lower Body Dressing: Unsupported sitting  Equipment Used: Reacher;Sock aid  ADL Comments: Introduced AE and had pt practice doffing/donning socks and donning/doffing underwear. Made pt and family aware where they could get AE.  Cognition:  Cognition  Overall Cognitive Status: Within Functional Limits for tasks assessed  Orientation Level: Oriented X4  Cognition  Arousal/Alertness: Awake/alert  Behavior During Therapy: WFL for tasks assessed/performed  Overall Cognitive Status: Within Functional  Limits for tasks assessed  Memory: Decreased recall of precautions  Physical Exam:  Blood pressure 168/65, pulse 79, temperature 98.4 F (36.9 C), temperature source Oral, resp. rate 16, height 5\' 6"  (1.676 m), weight 77.111 kg (170 lb), SpO2 92.00%.  Physical Exam  Constitutional: He is oriented to person, place, and time. He appears well-developed and well-nourished.  Eyes: EOM are normal.  Neck: Normal range of motion. Neck supple. No thyromegaly present.  Cardiovascular: Normal rate and regular rhythm.  Respiratory: Effort normal and breath sounds normal. No respiratory distress.  GI: Soft. Bowel sounds are normal. He exhibits no distension.  Neurological: He is alert and oriented to person, place, and time.  Follows full commands. Mild tremor. Head forward posture. No rigidity, minimal cogwheeling. Right hip mildly tender . LLE is  4/5 in the hip flexor knee extensor ankle dorsiflexor plantar flexor. RLE  3/5 in the hip flexors knee extensors ankle dorsiflexor plantar flexor UE's are 5/5. No gross sensory changes  Skin:  Hip incision Staples intact no drainage no erythema and appropriately tender  Psychiatric:  Mood is flat but appropriate , masked facies Results for  orders placed during the hospital encounter of 04/09/13 (from the past 48 hour(s))   CBC Status: Abnormal    Collection Time    04/11/13 8:55 AM   Result  Value  Range    WBC  12.8 (*)  4.0 - 10.5 K/uL    RBC  3.81 (*)  4.22 - 5.81 MIL/uL    Hemoglobin  12.0 (*)  13.0 - 17.0 g/dL    HCT  16.1 (*)  09.6 - 52.0 %    MCV  90.8  78.0 - 100.0 fL    MCH  31.5  26.0 - 34.0 pg    MCHC  34.7  30.0 - 36.0 g/dL    RDW  04.5  40.9 - 81.1 %    Platelets  181  150 - 400 K/uL   BASIC METABOLIC PANEL Status: Abnormal    Collection Time    04/11/13 8:55 AM   Result  Value  Range    Sodium  133 (*)  135 - 145 mEq/L    Potassium  3.6  3.5 - 5.1 mEq/L    Chloride  99  96 - 112 mEq/L    CO2  24  19 - 32 mEq/L    Glucose, Bld  164 (*)  70 - 99 mg/dL    BUN  19  6 - 23 mg/dL    Creatinine, Ser  9.14  0.50 - 1.35 mg/dL    Calcium  8.5  8.4 - 10.5 mg/dL    GFR calc non Af Amer  79 (*)  >90 mL/min    GFR calc Af Amer  >90  >90 mL/min    Comment:  (NOTE)     The eGFR has been calculated using the CKD EPI equation.     This calculation has not been validated in all clinical situations.     eGFR's persistently <90 mL/min signify possible Chronic Kidney     Disease.   PROCALCITONIN Status: None    Collection Time    04/11/13 5:00 PM   Result  Value  Range    Procalcitonin  0.32     Comment:      Interpretation:     PCT (Procalcitonin) <= 0.5 ng/mL:     Systemic infection (sepsis) is not likely.     Local bacterial infection is possible.     (  NOTE)     ICU PCT Algorithm Non ICU PCT Algorithm     ---------------------------- ------------------------------     PCT < 0.25 ng/mL PCT < 0.1 ng/mL     Stopping of antibiotics Stopping of antibiotics     strongly encouraged. strongly encouraged.     ---------------------------- ------------------------------     PCT level decrease by PCT < 0.25 ng/mL     >= 80% from peak PCT     OR PCT 0.25 - 0.5 ng/mL Stopping of antibiotics     encouraged.      Stopping of antibiotics     encouraged.     ---------------------------- ------------------------------     PCT level decrease by PCT >= 0.25 ng/mL     < 80% from peak PCT     AND PCT >= 0.5 ng/mL Continuing antibiotics     encouraged.     Continuing antibiotics     encouraged.     ---------------------------- ------------------------------     PCT level increase compared PCT > 0.5 ng/mL     with peak PCT AND     PCT >= 0.5 ng/mL Escalation of antibiotics     strongly encouraged.     Escalation of antibiotics     strongly encouraged.   URINALYSIS, ROUTINE W REFLEX MICROSCOPIC Status: Abnormal    Collection Time    04/11/13 6:17 PM   Result  Value  Range    Color, Urine  AMBER (*)  YELLOW    Comment:  BIOCHEMICALS MAY BE AFFECTED BY COLOR    APPearance  CLOUDY (*)  CLEAR    Specific Gravity, Urine  1.022  1.005 - 1.030    pH  5.5  5.0 - 8.0    Glucose, UA  NEGATIVE  NEGATIVE mg/dL    Hgb urine dipstick  LARGE (*)  NEGATIVE    Bilirubin Urine  NEGATIVE  NEGATIVE    Ketones, ur  15 (*)  NEGATIVE mg/dL    Protein, ur  295 (*)  NEGATIVE mg/dL    Urobilinogen, UA  1.0  0.0 - 1.0 mg/dL    Nitrite  NEGATIVE  NEGATIVE    Leukocytes, UA  NEGATIVE  NEGATIVE   URINE MICROSCOPIC-ADD ON Status: Abnormal    Collection Time    04/11/13 6:17 PM   Result  Value  Range    Squamous Epithelial / LPF  RARE  RARE    WBC, UA  0-2  <3 WBC/hpf    RBC / HPF  11-20  <3 RBC/hpf    Bacteria, UA  FEW (*)  RARE    Casts  HYALINE CASTS (*)  NEGATIVE   CBC Status: Abnormal    Collection Time    04/12/13 4:35 AM   Result  Value  Range    WBC  11.6 (*)  4.0 - 10.5 K/uL    RBC  3.36 (*)  4.22 - 5.81 MIL/uL    Hemoglobin  10.3 (*)  13.0 - 17.0 g/dL    HCT  62.1 (*)  30.8 - 52.0 %    MCV  91.7  78.0 - 100.0 fL    MCH  30.7  26.0 - 34.0 pg    MCHC  33.4  30.0 - 36.0 g/dL    RDW  65.7  84.6 - 96.2 %    Platelets  201  150 - 400 K/uL    No results found.  Post Admission Physician Evaluation:   1. Functional deficits secondary to right femoral neck  fracture in a patient with Parkinson's disease. 2. Patient is admitted to receive collaborative, interdisciplinary care between the physiatrist, rehab nursing staff, and therapy team. 3. Patient's level of medical complexity and substantial therapy needs in context of that medical necessity cannot be provided at a lesser intensity of care such as a SNF. 4. Patient has experienced substantial functional loss from his/her baseline which was documented above under the "Functional History" and "Functional Status" headings. Judging by the patient's diagnosis, physical exam, and functional history, the patient has potential for functional progress which will result in measurable gains while on inpatient rehab. These gains will be of substantial and practical use upon discharge in facilitating mobility and self-care at the household level. 5. Physiatrist will provide 24 hour management of medical needs as well as oversight of the therapy plan/treatment and provide guidance as appropriate regarding the interaction of the two. 6. 24 hour rehab nursing will assist with bladder management, bowel management, safety, skin/wound care, disease management, medication administration, pain management and patient education and help integrate therapy concepts, techniques,education, etc. 7. PT will assess and treat for/with: pre gait, gait training, endurance , safety, equipment, neuromuscular re education. Goals are: Sup/Min A. 8. OT will assess and treat for/with: ADLs, Cognitive perceptual skills, Neuromuscular re education, safety, endurance, equipment. Goals are: Sup/MinA. 9. SLP will assess and treat for/with: swallow retraining, eval cognition. Goals are: safe and adequate po intake. 10. Case Management and Social Worker will assess and treat for psychological issues and discharge planning. 11. Team conference will be held weekly to assess progress toward goals  and to determine barriers to discharge. 12. Patient will receive at least 3 hours of therapy per day at least 5 days per week. 13. ELOS: 2-2.5 wks  14. Prognosis: good Medical Problem List and Plan:  1. Displaced right femoral neck fracture. Status post hemiarthroplasty 04/09/2013  2. DVT Prophylaxis/Anticoagulation: Subcutaneous Lovenox. Check vascular studies and  3. Pain Management: Hydrocodone and Robaxin as needed. Monitor with increased mobility  4. Neuropsych: This patient is capable of making decisions on his  own behalf.  5. Acute blood loss anemia. Continue iron supplement followup CBC  6. History of orthostatic hypotension. Continue Florinef. Monitor closely with increased mobility  7. Parkinson's disease. Sinemet as directed as well as Eldepryl 5 mg twice a day.Follow up Dr. Terrace Arabia  8.BPH. Continue Avodart as well as Flomax. Need to watch blood pressures closely. Discuss home regimen with patient. Check PVRs x3  9. Hyperlipidemia. Lipitor   Erick Colace M.D. [facility] FAAPM&R (Sports Med, Neuromuscular Med) Diplomate Am Board of Electrodiagnostic Med Diplomate Am Board of Pain Medicine Fellow Am Board of Interventional Pain Physicians  04/14/2013

## 2013-04-15 ENCOUNTER — Inpatient Hospital Stay (HOSPITAL_COMMUNITY): Payer: Medicare Other | Admitting: Occupational Therapy

## 2013-04-15 ENCOUNTER — Inpatient Hospital Stay (HOSPITAL_COMMUNITY): Payer: Medicare Other

## 2013-04-15 ENCOUNTER — Inpatient Hospital Stay (HOSPITAL_COMMUNITY): Payer: Medicare Other | Admitting: Physical Therapy

## 2013-04-15 DIAGNOSIS — R1310 Dysphagia, unspecified: Secondary | ICD-10-CM

## 2013-04-15 DIAGNOSIS — G20 Parkinson's disease: Secondary | ICD-10-CM

## 2013-04-15 DIAGNOSIS — M7989 Other specified soft tissue disorders: Secondary | ICD-10-CM

## 2013-04-15 DIAGNOSIS — S72009A Fracture of unspecified part of neck of unspecified femur, initial encounter for closed fracture: Secondary | ICD-10-CM

## 2013-04-15 LAB — CBC WITH DIFFERENTIAL/PLATELET
Basophils Absolute: 0.1 10*3/uL (ref 0.0–0.1)
Basophils Relative: 1 % (ref 0–1)
Eosinophils Absolute: 0.5 10*3/uL (ref 0.0–0.7)
Eosinophils Relative: 5 % (ref 0–5)
HCT: 32.1 % — ABNORMAL LOW (ref 39.0–52.0)
Hemoglobin: 10.9 g/dL — ABNORMAL LOW (ref 13.0–17.0)
Lymphs Abs: 1.6 10*3/uL (ref 0.7–4.0)
MCH: 30.9 pg (ref 26.0–34.0)
MCHC: 34 g/dL (ref 30.0–36.0)
Monocytes Relative: 9 % (ref 3–12)
Neutro Abs: 7.6 10*3/uL (ref 1.7–7.7)
Neutrophils Relative %: 71 % (ref 43–77)
Platelets: 290 10*3/uL (ref 150–400)

## 2013-04-15 LAB — COMPREHENSIVE METABOLIC PANEL
ALT: <5 U/L (ref 0–53)
Albumin: 2.5 g/dL — ABNORMAL LOW (ref 3.5–5.2)
Alkaline Phosphatase: 60 U/L (ref 39–117)
BUN: 20 mg/dL (ref 6–23)
Potassium: 3.5 mEq/L (ref 3.5–5.1)
Sodium: 137 mEq/L (ref 135–145)
Total Protein: 5.8 g/dL — ABNORMAL LOW (ref 6.0–8.3)

## 2013-04-15 MED ORDER — SENNOSIDES-DOCUSATE SODIUM 8.6-50 MG PO TABS
2.0000 | ORAL_TABLET | Freq: Two times a day (BID) | ORAL | Status: DC
  Administered 2013-04-15 – 2013-04-23 (×13): 2 via ORAL
  Filled 2013-04-15 (×18): qty 2

## 2013-04-15 NOTE — Progress Notes (Addendum)
INITIAL NUTRITION ASSESSMENT  DOCUMENTATION CODES Per approved criteria  -Not Applicable   INTERVENTION: 1.  Supplements; continue Ensure Complete BID (350 kcals, 13 gm protein per 8 fl oz bottle.  2.  General healthful diet; encourage intake of foods and beverages as able.  RD to follow and assess for nutritional adequacy.   NUTRITION DIAGNOSIS: Increased nutrient needs related to healing, increased energy expenditure as evidenced by hip fx, rehab participant  Goal: Pt to meet >/= 90% of their estimated nutrition needs, likely met with improved intake  Monitor:  PO & supplemental intake, weight, labs, I/O's  Reason for Assessment: MAOI, poor PO intake  79 y.o. male  Admitting Dx: Fracture of femoral neck, right  ASSESSMENT: Patient with PMH of HTN, hyperlipidemia and Parkinson's disease; presented after falling in his kitchen landing on his R hip; X-ray showed a displaced right femoral subcapital fracture.   Patient s/p procedure 11/29: RIGHT HIP HEMIARTHROPLASTY  Pt's intake has improved since transition to inpatient rehab.  Now consuming 60-100% of meals consistently. He is also drinking his Ensure.   Noted patient is on MAOI selegiline (ELDEPRYL).  Current hospital diet is low in Tyramine.  Please consult RD for education as/if needed.  Height: Ht Readings from Last 1 Encounters:  04/09/13 5\' 6"  (1.676 m)    Weight: Wt Readings from Last 1 Encounters:  04/09/13 170 lb (77.111 kg)    Ideal Body Weight: 142 lb  % Ideal Body Weight: 120%  Wt Readings from Last 10 Encounters:  04/09/13 170 lb (77.111 kg)  04/09/13 170 lb (77.111 kg)  03/08/13 168 lb (76.204 kg)  03/03/13 172 lb 6.4 oz (78.2 kg)  02/16/13 176 lb (79.833 kg)  11/10/12 180 lb (81.647 kg)  07/28/12 185 lb (83.915 kg)  06/28/12 189 lb (85.73 kg)  11/20/11 184 lb (83.462 kg)  11/20/11 184 lb (83.462 kg)    Usual Body Weight: 180 lbs  % Usual Body Weight: 95%  BMI:  There is no weight on  file to calculate BMI.  Estimated Nutritional Needs: Kcal: 1700-1900 Protein: 80-90 gm Fluid: 1.7-1.9 L  Skin: hip surgical incision   Diet Order: Dysphagia 3, thin  EDUCATION NEEDS: -No education needs identified at this time   Intake/Output Summary (Last 24 hours) at 04/15/13 1212 Last data filed at 04/15/13 0730  Gross per 24 hour  Intake    720 ml  Output    590 ml  Net    130 ml    Labs:   Recent Labs Lab 04/10/13 0605 04/11/13 0855 04/14/13 1730 04/15/13 0520  NA 135 133*  --  137  K 4.0 3.6  --  3.5  CL 100 99  --  101  CO2 23 24  --  27  BUN 24* 19  --  20  CREATININE 0.99 0.84 0.84 0.73  CALCIUM 8.4 8.5  --  8.5  GLUCOSE 125* 164*  --  107*    Scheduled Meds: . amLODipine  10 mg Oral Daily  . aspirin EC  81 mg Oral QPC supper  . atorvastatin  20 mg Oral Daily  . B-complex with vitamin C  1 tablet Oral Daily  . carbidopa-levodopa  1 tablet Oral QHS  . carbidopa-levodopa  1 tablet Oral TID WC  . cholecalciferol  2,000 Units Oral Daily  . dutasteride  0.5 mg Oral QPC supper  . enoxaparin (LOVENOX) injection  40 mg Subcutaneous Q24H  . feeding supplement (ENSURE COMPLETE)  237 mL Oral BID  BM  . ferrous sulfate  325 mg Oral TID PC  . fludrocortisone  0.1 mg Oral Daily  . irbesartan  150 mg Oral BID  . multivitamin with minerals  1 tablet Oral Daily  . pantoprazole  40 mg Oral Q0600  . polyethylene glycol  17 g Oral BID  . selegiline  5 mg Oral BID WC  . senna-docusate  2 tablet Oral BID  . tamsulosin  0.4 mg Oral QPC supper    Continuous Infusions:    Past Medical History  Diagnosis Date  . Hypertension   . Hyperlipidemia   . Aneurysm of infrarenal abdominal aorta SUPRAILIAC  4CM PER CT 10-20-2010--  STABLE  . Bladder cancer RECURRENT  (FIRST DX  2001)    HX TURBT AND CHEMO BLADDER INSTILLATION TX'S  . Parkinson disease   . Frequency of urination   . Nocturia   . Orthostatic hypotension   . Peripheral neuropathy     Past Surgical  History  Procedure Laterality Date  . Tonsillectomy and adenoidectomy  1938  . Left inguinal hernia repair  1996  . Transurethral resection of bladder tumor  09-11-1999    BLADDER CANCER  . Cysto/ retrograde pyelogram/ bladder bx's  07-03-2004  . Cysto/ bladder bx/ fulgeration bladder tumor  10-09-2004  . Appendectomy  10-20-2010  . Cystoscopy with biopsy  11/28/2011    Procedure: CYSTOSCOPY WITH BIOPSY;  Surgeon: Antony Haste, MD;  Location: [facility];  Service: Urology;  Laterality: N/A;  . Vasectomy    . Hip arthroplasty Right 04/09/2013    Procedure: ARTHROPLASTY BIPOLAR HIP;  Surgeon: Verlee Rossetti, MD;  Location: [facility] OR;  Service: Orthopedics;  Laterality: Right;    Loyce Dys, MS RD LDN Clinical Inpatient Dietitian Pager: [phone] Weekend/After hours pager: [phone]

## 2013-04-15 NOTE — Care Management Note (Signed)
Inpatient Rehabilitation Center Individual Statement of Services  Patient Name:  Ivan Clark  Date:  04/15/2013  Welcome to the Inpatient Rehabilitation Center.  Our goal is to provide you with an individualized program based on your diagnosis and situation, designed to meet your specific needs.  With this comprehensive rehabilitation program, you will be expected to participate in at least 3 hours of rehabilitation therapies Monday-Friday, with modified therapy programming on the weekends.  Your rehabilitation program will include the following services:  Physical Therapy (PT), Occupational Therapy (OT), 24 hour per day rehabilitation nursing, Case Management (Social Worker), Rehabilitation Medicine, Nutrition Services and Pharmacy Services  Weekly team conferences will be held on Wednesday to discuss your progress.  Your Social Worker will talk with you frequently to get your input and to update you on team discussions.  Team conferences with you and your family in attendance may also be held.  Expected length of stay: 10-14 days Overall anticipated outcome: supervision with cueing and min for shower transfers  Depending on your progress and recovery, your program may change. Your Social Worker will coordinate services and will keep you informed of any changes. Your Social Worker's name and contact numbers are listed  below.  The following services may also be recommended but are not provided by the Inpatient Rehabilitation Center:   Home Health Rehabiltiation Services  Outpatient Rehabilitation Services    Arrangements will be made to provide these services after discharge if needed.  Arrangements include referral to agencies that provide these services.  Your insurance has been verified to be:  [employer] Your primary doctor is:  Dr  Duane Lope  Pertinent information will be shared with your doctor and your insurance company.  Social Worker:  Dossie Der, [phone] or  [phone]  Information discussed with and copy given to patient by: Lucy Chris, 04/15/2013, 8:49 AM

## 2013-04-15 NOTE — Evaluation (Signed)
Physical Therapy Assessment and Plan  Patient Details  Name: Ivan Clark MRN: [id number] Date of Birth: February 25, 1929  PT Diagnosis: Abnormal posture, Abnormality of gait, Difficulty walking, Impaired sensation and Muscle weakness  Rehab Potential: Good ELOS: 12-14 days   Today's Date: 04/15/2013 Time: 1107-1200 Time Calculation (min): 53 min  Problem List:  Patient Active Problem List   Diagnosis Date Noted  . Fracture of femoral neck, right 04/14/2013  . Right femoral fracture 04/09/2013  . Hypertension 04/09/2013  . Hyperlipidemia 04/09/2013  . BPH (benign prostatic hypertrophy) 04/09/2013  . Closed right hip fracture 04/09/2013  . Hip fracture, right 04/09/2013  . Femoral neck fracture 04/09/2013  . Dysphagia, pharyngeal 03/02/2013  . Cervical dystonia 07/28/2012  . Sialorrhea 07/28/2012  . Personal history of malignant neoplasm of bladder 07/27/2012  . Unspecified essential hypertension 07/27/2012  . Dizziness and giddiness 07/27/2012  . Parkinsonism 07/27/2012    Past Medical History:  Past Medical History  Diagnosis Date  . Hypertension   . Hyperlipidemia   . Aneurysm of infrarenal abdominal aorta SUPRAILIAC  4CM PER CT 10-20-2010--  STABLE  . Bladder cancer RECURRENT  (FIRST DX  2001)    HX TURBT AND CHEMO BLADDER INSTILLATION TX'S  . Parkinson disease   . Frequency of urination   . Nocturia   . Orthostatic hypotension   . Peripheral neuropathy    Past Surgical History:  Past Surgical History  Procedure Laterality Date  . Tonsillectomy and adenoidectomy  1938  . Left inguinal hernia repair  1996  . Transurethral resection of bladder tumor  09-11-1999    BLADDER CANCER  . Cysto/ retrograde pyelogram/ bladder bx's  07-03-2004  . Cysto/ bladder bx/ fulgeration bladder tumor  10-09-2004  . Appendectomy  10-20-2010  . Cystoscopy with biopsy  11/28/2011    Procedure: CYSTOSCOPY WITH BIOPSY;  Surgeon: Antony Haste, MD;  Location: [facility];  Service: Urology;  Laterality: N/A;  . Vasectomy    . Hip arthroplasty Right 04/09/2013    Procedure: ARTHROPLASTY BIPOLAR HIP;  Surgeon: Verlee Rossetti, MD;  Location: [facility] OR;  Service: Orthopedics;  Laterality: Right;    Assessment & Plan Clinical Impression: Patient is a 79 y.o. right-handed male with history of orthostatic hypotension as well as autonomic dysfunction from Parkinson's disease. Patient independent prior to admission at times using a walker. Admitted 04/09/2013 after a fall when he lost his balance landing on his right hip without loss of consciousness. X-rays and imaging revealed a displaced right femoral subcapital fracture. Underwent right hip hemiarthroplasty 04/09/2013 per Dr. [name]. Weightbearing as tolerated with posterior hip precautions. Postoperative pain management. Subcutaneous Lovenox for DVT prophylaxis. Acute blood loss anemia 10.3 and monitored. He remains on Sinemet for history of Parkinson's disease.  Patient transferred to CIR on 04/14/2013 .   Patient currently requires max with mobility secondary to muscle weakness and muscle joint tightness, decreased cardiorespiratoy endurance, impaired timing and sequencing, unbalanced muscle activation and decreased coordination and decreased sitting balance, decreased standing balance, decreased postural control, decreased balance strategies and difficulty maintaining precautions.  Prior to hospitalization, patient was modified independent  with mobility and lived with Spouse in a House home.  Home access is 5 to porch + 1 to enter houseStairs to enter.  Patient will benefit from skilled PT intervention to maximize safe functional mobility, minimize fall risk and decrease caregiver burden for planned discharge home with 24 hour supervision.  Anticipate patient will benefit from follow up OP at discharge.  PT - End of Session Activity Tolerance: Tolerates 30+ min activity with multiple rests Endurance  Deficit: Yes Endurance Deficit Description: SOB with minimal activity requires rest breaks and vcs for pursed lip breathing PT Assessment Rehab Potential: Good PT Patient demonstrates impairments in the following area(s): Balance;Endurance;Motor;Sensory;Other (comment);Pain (Posture) PT Transfers Functional Problem(s): Bed Mobility;Bed to Chair;Car;Furniture PT Locomotion Functional Problem(s): Ambulation;Stairs;Wheelchair Mobility PT Plan PT Intensity: Minimum of 1-2 x/day ,45 to 90 minutes PT Frequency: 5 out of 7 days PT Duration Estimated Length of Stay: 12-14 days PT Treatment/Interventions: Ambulation/gait training;Balance/vestibular training;Discharge planning;Disease management/prevention;DME/adaptive equipment instruction;Functional mobility training;Neuromuscular re-education;Pain management;Patient/family education;Stair training;Therapeutic Activities;Therapeutic Exercise;UE/LE Strength taining/ROM;UE/LE Coordination activities;Wheelchair propulsion/positioning PT Transfers Anticipated Outcome(s): supervision PT Locomotion Anticipated Outcome(s): supervision PT Recommendation Follow Up Recommendations: Outpatient PT;24 hour supervision/assistance (Parkinson's balance/gait program at outpatient) Patient destination: Home Equipment Recommended: Rolling walker with 5" wheels;Wheelchair cushion (measurements);Wheelchair (measurements)  Skilled Therapeutic Intervention Reviewed hip precautions with pt; pt only able to verbalize 1/3 hip precautions.  Also verbalized and demonstrated to pt various movements in sitting, standing and supine that are restricted secondary to hip precautions.  Pt verbalized understanding.  Pt wife reports that pt has had an increase in LOB/falls (4 falls) over the past year; pt reports that this most recent fall was not due to orthostasis as he is able to recognize symptoms of orthostasis and transition to sitting before passing out.  Pt reports this fall he was  reaching up high into cabinet for cereal but denied dizziness.  Educated wife and pt on the impact that PN, posture and Parkinson's has on balance and balance reactions.  Will continue to address.  PT Evaluation Precautions/Restrictions Precautions Precautions: Posterior Hip;Fall Restrictions RLE Weight Bearing: Weight bearing as tolerated General Chart Reviewed: Yes Response to Previous Treatment: Patient with no complaints from previous session. Family/Caregiver Present: Yes Sales executive) Vital Signs  Pain Pain Assessment Pain Assessment: No/denies pain Home Living/Prior Functioning Home Living Living Arrangements: Spouse/significant other Available Help at Discharge: Family;Available 24 hours/day Type of Home: House Home Access: Stairs to enter [employer] of Steps: 5 to porch + 1 to enter house Entrance Stairs-Rails: Right;Left;Can reach both Home Layout: One level Additional Comments: Use of 4WW some in the home but mostly in community so that he can use seat for rest breaks  Lives With: Spouse Prior Function Level of Independence: Independent with gait;Independent with transfers;Requires assistive device for independence  Able to Take Stairs?: Yes Driving: No Vocation: Retired Optometrist - History Baseline Vision: Wears glasses all the time Patient Visual Report: No change from baseline Perception Perception: Within Functional Limits Praxis Praxis: Intact  Cognition Overall Cognitive Status: Within Functional Limits for tasks assessed Orientation Level: Oriented X4 Sensation Sensation Light Touch: Impaired Detail Light Touch Impaired Details: Absent RLE;Absent LLE (h/o peripheral neuropathy; bilat feet numb) Proprioception: Appears Intact Coordination Gross Motor Movements are Fluid and Coordinated: No Fine Motor Movements are Fluid and Coordinated: No (BUEs, more deficits on right) Coordination and Movement Description:  Volitional/action tremors noted with BUE use and patient with BUE decreased speed, accuracy and rhythm, most noted during left GMC and right FMC. Motor  Motor Motor: Abnormal postural alignment and control;Other (comment) Motor - Skilled Clinical Observations: BUE volitional/action tremors; rigidity associated with Parkinson's; flexed trunk with forward head posture, neck rigidity  Mobility Bed Mobility Bed Mobility: Not assessed Supine to Sit: 4: Min assist;HOB flat Sitting - Scoot to Edge of Bed: 5: Supervision Transfers Sit to Stand: 3: Mod assist;With upper extremity assist;From bed  Sit to Stand Details: Tactile cues for placement;Verbal cues for precautions/safety;Verbal cues for technique Stand to Sit: With upper extremity assist;With armrests;To chair/3-in-1;5: Supervision Stand to Sit Details (indicate cue type and reason): Tactile cues for placement;Verbal cues for technique;Verbal cues for precautions/safety Stand Pivot Transfers: 2: Max assist Stand Pivot Transfer Details (indicate cue type and reason): Pt required max A, lifting and lowering assistance, for stand pivot from lower surface secondary to hip precautions and assistance for balance and weight shifting while pivoting recliner > w/c. Locomotion  Ambulation Ambulation: Yes Ambulation/Gait Assistance: 3: Mod assist Ambulation Distance (Feet): 10 Feet Ambulation/Gait Assistance Details: Pt and wife report that pt has 4WW at home but he mainly used it out in the community so he can rest on the seat.  Performed gait assessment with mod R HHA x 10' with impairments listed below.  Also performed gait with RW x 40' in controlled environment with min A with continued trunk flexion and downward gaze at floor but with improved stability to allow for increased R lateral weight shift, stance time and increased L step length; when cued to look ahead pt returned to step to gait sequence; gait velocity decreased overall.  Mild shuffling  noted but wife reports it worsens when pt due for medication.   Gait Gait: Yes Gait Pattern: Impaired Gait Pattern: Step-to pattern;Decreased step length - left;Decreased stance time - right;Decreased stride length;Decreased weight shift to right;Shuffle;Trunk flexed Stairs / Additional Locomotion Stairs: Yes Stairs Assistance: 3: Mod assist Stairs Assistance Details (indicate cue type and reason): Pt required mod A for stair negotiation with assistance for lateral weight shifting and verbal cues for safe stepping sequence. Stair Management Technique: Two rails;Step to pattern;Forwards Number of Stairs: 3 Height of Stairs: 4 Wheelchair Mobility Wheelchair Mobility: No Distance: 100  Trunk/Postural Assessment  Pt wearing soft cervical collar to maintain head position at rest; pt reports that with botox injections neck mobility and ROM has improved but since 3rd injection the muscles have been very weak.   Cervical Assessment Cervical Assessment: Exceptions to [facility] (forward head, R lateral flexion, L rotation, rigidity) Thoracic Assessment Thoracic Assessment: Exceptions to [facility] (kyphotic) Lumbar Assessment Lumbar Assessment: Exceptions to [facility] (decreased lordosis; sacral sitting) Postural Control Postural Control: Deficits on evaluation (LOB posterior; decreased/delayed ankle and hip strategies)  Balance Static Sitting Balance Static Sitting - Balance Support: Feet supported;Bilateral upper extremity supported Static Sitting - Level of Assistance: 6: Modified independent (Device/Increase time) Static Sitting - Comment/# of Minutes: With back supported Dynamic Sitting Balance Dynamic Sitting - Balance Support: Right upper extremity supported;Left upper extremity supported;Feet supported Dynamic Sitting - Level of Assistance: 3: Mod assist Dynamic Sitting - Comments: Assistance needed to lean away from back of w/c and maintain Static Standing Balance Static Standing - Balance Support:  Right upper extremity supported;Left upper extremity supported Static Standing - Level of Assistance: 4: Min assist Dynamic Standing Balance Dynamic Standing - Balance Support: Right upper extremity supported;Left upper extremity supported Dynamic Standing - Level of Assistance: 3: Mod assist Extremity Assessment  RUE Assessment RUE Assessment: Within Functional Limits (AROM and MMT) LUE Assessment LUE Assessment: Within Functional Limits (AROM and MMT) RLE Assessment RLE Assessment: Exceptions to [facility] RLE Strength RLE Overall Strength: Deficits;Due to precautions RLE Overall Strength Comments: Hip flexion not tested secondary to hip precautions; knee flexion/extension, ankle DF 4/5 LLE Assessment LLE Assessment: Exceptions to [facility] LLE Strength LLE Overall Strength: Deficits;Due to premorbid status LLE Overall Strength Comments: Hip flexion 3/5, knee extension 5/5, knee  flexion and ankle DF 4-/5  FIM:  FIM - Bed/Chair Transfer Bed/Chair Transfer Assistive Devices: Arm rests Bed/Chair Transfer: 2: Chair or W/C > Bed: Max A (lift and lower assist);2: Bed > Chair or W/C: Max A (lift and lower assist) FIM - Locomotion: Wheelchair Distance: 100 Locomotion: Wheelchair: 1: Total Assistance/staff pushes wheelchair (Pt<25%) FIM - Locomotion: Ambulation Ambulation/Gait Assistance: 3: Mod assist Locomotion: Ambulation: 1: Travels less than 50 ft with moderate assistance (Pt: 50 - 74%) FIM - Locomotion: Stairs Locomotion: Building control surveyor: Hand rail - 2 Locomotion: Stairs: 1: Up and Down < 4 stairs with moderate assistance (Pt: 50 - 74%)   Refer to Care Plan for Long Term Goals  Recommendations for other services: None  Discharge Criteria: Patient will be discharged from PT if patient refuses treatment 3 consecutive times without medical reason, if treatment goals not met, if there is a change in medical status, if patient makes no progress towards goals or if patient is discharged  from hospital.  The above assessment, treatment plan, treatment alternatives and goals were discussed and mutually agreed upon: by patient and by family  Houghton Desanctis 04/15/2013, 12:42 PM

## 2013-04-15 NOTE — Progress Notes (Signed)
Orthopedics Progress Note  Subjective: Patient reports no pain in the hip.  Objective:  Filed Vitals:   04/15/13 0530  BP: 169/80  Pulse: 73  Temp: 97.5 F (36.4 C)  Resp: 18    General: Awake and alert  Musculoskeletal: right hip dressing CDI, no erythema, no swelling, leg lengths equal, no pain with AROM and PROM of the right hip Neurovascularly intact  Lab Results  Component Value Date   WBC 10.6* 04/15/2013   HGB 10.9* 04/15/2013   HCT 32.1* 04/15/2013   MCV 90.9 04/15/2013   PLT 290 04/15/2013       Component Value Date/Time   NA 137 04/15/2013 0520   K 3.5 04/15/2013 0520   CL 101 04/15/2013 0520   CO2 27 04/15/2013 0520   GLUCOSE 107* 04/15/2013 0520   BUN 20 04/15/2013 0520   CREATININE 0.73 04/15/2013 0520   CALCIUM 8.5 04/15/2013 0520   GFRNONAA 84* 04/15/2013 0520   GFRAA >90 04/15/2013 0520    Lab Results  Component Value Date   INR 1.04 04/09/2013   INR 1.01 10/20/2010    Assessment/Plan:  s/p right hip hemi-arthroplasty for fracture, doing well from ortho standpoint appreciate CIR with this highly motivated patient and to this point he is having an outstanding recovery. Called his wife and gave her an update.  Ivan Clark. [name], MD 04/15/2013 10:17 AM

## 2013-04-15 NOTE — Progress Notes (Signed)
Bilateral lower extremity venous duplex:  No evidence of DVT, superficial thrombosis, or Baker's Cyst.   

## 2013-04-15 NOTE — Evaluation (Signed)
Occupational Therapy Assessment and Plan  Patient Details  Name: Ivan Clark MRN: [id number] Date of Birth: 1929-01-14  OT Diagnosis: abnormal posture, acute pain, muscle weakness (generalized) and limitations due to THPs Rehab Potential: Rehab Potential: Good ELOS: 10-12 days   Today's Date: 04/15/2013 Time: 0800-0900 Time Calculation (min): 60 min  Problem List:  Patient Active Problem List   Diagnosis Date Noted  . Fracture of femoral neck, right 04/14/2013  . Right femoral fracture 04/09/2013  . Hypertension 04/09/2013  . Hyperlipidemia 04/09/2013  . BPH (benign prostatic hypertrophy) 04/09/2013  . Closed right hip fracture 04/09/2013  . Hip fracture, right 04/09/2013  . Femoral neck fracture 04/09/2013  . Dysphagia, pharyngeal 03/02/2013  . Cervical dystonia 07/28/2012  . Sialorrhea 07/28/2012  . Personal history of malignant neoplasm of bladder 07/27/2012  . Unspecified essential hypertension 07/27/2012  . Dizziness and giddiness 07/27/2012  . Parkinsonism 07/27/2012    Past Medical History:  Past Medical History  Diagnosis Date  . Hypertension   . Hyperlipidemia   . Aneurysm of infrarenal abdominal aorta SUPRAILIAC  4CM PER CT 10-20-2010--  STABLE  . Bladder cancer RECURRENT  (FIRST DX  2001)    HX TURBT AND CHEMO BLADDER INSTILLATION TX'S  . Parkinson disease   . Frequency of urination   . Nocturia   . Orthostatic hypotension   . Peripheral neuropathy    Past Surgical History:  Past Surgical History  Procedure Laterality Date  . Tonsillectomy and adenoidectomy  1938  . Left inguinal hernia repair  1996  . Transurethral resection of bladder tumor  09-11-1999    BLADDER CANCER  . Cysto/ retrograde pyelogram/ bladder bx's  07-03-2004  . Cysto/ bladder bx/ fulgeration bladder tumor  10-09-2004  . Appendectomy  10-20-2010  . Cystoscopy with biopsy  11/28/2011    Procedure: CYSTOSCOPY WITH BIOPSY;  Surgeon: Antony Haste, MD;  Location:  [facility];  Service: Urology;  Laterality: N/A;  . Vasectomy    . Hip arthroplasty Right 04/09/2013    Procedure: ARTHROPLASTY BIPOLAR HIP;  Surgeon: Verlee Rossetti, MD;  Location: [facility] OR;  Service: Orthopedics;  Laterality: Right;    Assessment & Plan Clinical Impression: Ivan Clark is a 79 y.o. right-handed male with history of orthostatic hypotension as well as autonomic dysfunction from Parkinson's disease. Patient independent prior to admission at times using a walker. Admitted 04/09/2013 after a fall when he lost his balance landing on his right hip without loss of consciousness. X-rays and imaging revealed a displaced right femoral subcapital fracture. Underwent right hip hemiarthroplasty 04/09/2013 per Dr. [name]. Weightbearing as tolerated with posterior hip precautions.  Patient transferred to CIR on 04/14/2013 .    Patient currently requires min- max assist with basic self-care skills secondary to muscle weakness, decreased cardiorespiratoy endurance and decreased standing balance, decreased postural control, decreased balance strategies and difficulty maintaining precautions.  Prior to hospitalization, patient could complete BADL with overall Modified Independence except wife assisted with socks and shoes.  Patient will benefit from skilled intervention to decrease level of assist with basic self-care skills prior to discharge home with care partner.  Anticipate patient will require 24 hour supervision and minimal physical assistance and follow up home health.  OT - End of Session Activity Tolerance: Tolerates 30+ min activity with multiple rests Endurance Deficit: Yes Endurance Deficit Description: SOB with minimal activity requires rest breaks and vcs for pursed lip breathing OT Assessment Rehab Potential: Good Barriers to Discharge: Decreased caregiver support Barriers  to Discharge Comments: Wife has "breathing problems". OT Patient demonstrates impairments  in the following area(s): Balance;Endurance;Safety OT Basic ADL's Functional Problem(s): Grooming;Bathing;Dressing;Toileting OT Transfers Functional Problem(s): Toilet;Tub/Shower OT Plan OT Intensity: Minimum of 1-2 x/day, 45 to 90 minutes OT Frequency: 5 out of 7 days OT Treatment/Interventions: Social worker;Discharge planning;Functional mobility training;Patient/family education;Self Care/advanced ADL retraining;Therapeutic Activities OT Basic Self-Care Anticipated Outcome(s): Supervision, Min assist with LB dressing (wife performed PTA) OT Toileting Anticipated Outcome(s): Supervision OT Bathroom Transfers Anticipated Outcome(s): Supervision  Skilled Therapeutic Intervention OT evaluation and self care retraining to include sponge bath, dress, and groom.  Focused session on activity tolerance, adhering to THPs with all functional mobility and during BADL tasks, bed mobility, transfers, and sit><stands.  Patient would benefit from practicing with AE to improve his independence with LB bath & dress.  Patient required rest breaks and vcs for pursed lip breathing during session.  Patient resting in recliner upon completion of session with all items within reach and soft cervical collar in place.   OT Evaluation Precautions/Restrictions  Precautions Precautions: Posterior Hip;Fall Restrictions RLE Weight Bearing: Weight bearing as tolerated Pain Denies pain Home Living/Prior Functioning Home Living Available Help at Discharge: Family;Available 24 hours/day Type of Home: House Additional Comments: Patient showered every other day and has walk in shower with door and a shower chair.  Recomment install grab bars and HH shower.  Lives With: Spouse IADL History Occupation: Retired Education officer, community) IADL Comments: Wife performs all IADLs Prior Function Comments: "Wife put lotion all over me after a shower and tied my shoes" ADL Refer to FIM  below Vision/Perception  Vision - History Baseline Vision: Wears glasses all the time ("for reading") Patient Visual Report: No change from baseline  Cognition Overall Cognitive Status: Within Functional Limits for tasks assessed Orientation Level: Oriented X4 Sensation Sensation Light Touch: Appears Intact Coordination Gross Motor Movements are Fluid and Coordinated: No Fine Motor Movements are Fluid and Coordinated: No (BUEs, more deficits on right) Coordination and Movement Description: Volitional/action tremors noted with BUE use and patient with BUE decreased speed, accuracy and rhythm, most noted during left GMC and right FMC. Motor  Motor Motor - Skilled Clinical Observations: BUE volitional/action tremors Mobility  Bed Mobility Supine to Sit: 4: Min assist;HOB flat Sitting - Scoot to Edge of Bed: 5: Supervision Transfers Sit to Stand: 3: Mod assist;With upper extremity assist;From bed Sit to Stand Details: Tactile cues for placement;Verbal cues for precautions/safety;Verbal cues for technique Stand to Sit: With upper extremity assist;With armrests;To chair/3-in-1;5: Supervision Stand to Sit Details (indicate cue type and reason): Tactile cues for placement;Verbal cues for technique;Verbal cues for precautions/safety  Trunk/Postural Assessment  Cervical Assessment Cervical Assessment: Exceptions to [facility] (forward head, R lateral flexion, L rotation, rigidity) Thoracic Assessment Thoracic Assessment: Exceptions to [facility] (kyphotic) Lumbar Assessment Lumbar Assessment: Exceptions to [facility] (decreased lordosis; sacral sitting) Postural Control Postural Control: Deficits on evaluation (LOB posterior; decreased/delayed ankle and hip strategies)  Balance Static Sitting Balance Static Sitting - Balance Support: Feet supported;Bilateral upper extremity supported Static Sitting - Level of Assistance: 6: Modified independent (Device/Increase time) Static Sitting - Comment/# of Minutes:  With back supported Dynamic Sitting Balance Dynamic Sitting - Balance Support: Right upper extremity supported;Left upper extremity supported;Feet supported Dynamic Sitting - Level of Assistance: 3: Mod assist Dynamic Sitting - Comments: Assistance needed to lean away from back of w/c and maintain Static Standing Balance Static Standing - Balance Support: Right upper extremity supported;Left upper extremity supported Static Standing - Level of Assistance:  4: Min assist Dynamic Standing Balance Dynamic Standing - Balance Support: Right upper extremity supported;Left upper extremity supported Dynamic Standing - Level of Assistance: 3: Mod assist Extremity/Trunk Assessment RUE Assessment RUE Assessment: Within Functional Limits (AROM and MMT) LUE Assessment LUE Assessment: Within Functional Limits (AROM and MMT)  FIM:  FIM - Eating Eating Activity: 6: Modified consistency diet: (comment);6: More than reasonable amount of time;5: Set-up assist for open containers (Dys 3) FIM - Grooming Grooming Steps: Wash, rinse, dry face;Wash, rinse, dry hands;Oral care, brush teeth, clean dentures;Brush, comb hair Grooming: 4: Steadying assist  or patient completes 3 of 4 or 4 of 5 steps FIM - Bathing Bathing Steps Patient Completed: Chest;Right Arm;Left Arm;Abdomen;Front perineal area;Buttocks;Right upper leg;Left upper leg Bathing: 4: Min-Patient completes 8-9 74f 10 parts or 75+ percent FIM - Upper Body Dressing/Undressing Upper body dressing/undressing steps patient completed: Thread/unthread right sleeve of pullover shirt/dresss;Thread/unthread left sleeve of pullover shirt/dress;Put head through opening of pull over shirt/dress;Pull shirt over trunk Upper body dressing/undressing: 5: Set-up assist to: Obtain clothing/put away FIM - Lower Body Dressing/Undressing Lower body dressing/undressing steps patient completed: Thread/unthread left underwear leg;Thread/unthread left pants leg;Pull pants  up/down FIM - Banker Devices: Arm rests Bed/Chair Transfer: 4: Supine > Sit: Min A (steadying Pt. > 75%/lift 1 leg);2: Bed > Chair or W/C: Max A (lift and lower assist)   Refer to Care Plan for Long Term Goals  Recommendations for other services: None  Discharge Criteria: Patient will be discharged from OT if patient refuses treatment 3 consecutive times without medical reason, if treatment goals not met, if there is a change in medical status, if patient makes no progress towards goals or if patient is discharged from hospital.  The above assessment, treatment plan, treatment alternatives and goals were discussed and mutually agreed upon: by patient  Javyn Havlin 04/15/2013, 4:05 PM

## 2013-04-15 NOTE — Progress Notes (Signed)
Social Work Assessment and Plan Social Work Assessment and Plan  Patient Details  Name: Ivan Clark MRN: [id number] Date of Birth: 10/16/1928  Today's Date: 04/15/2013  Problem List:  Patient Active Problem List   Diagnosis Date Noted  . Fracture of femoral neck, right 04/14/2013  . Right femoral fracture 04/09/2013  . Hypertension 04/09/2013  . Hyperlipidemia 04/09/2013  . BPH (benign prostatic hypertrophy) 04/09/2013  . Closed right hip fracture 04/09/2013  . Hip fracture, right 04/09/2013  . Femoral neck fracture 04/09/2013  . Dysphagia, pharyngeal 03/02/2013  . Cervical dystonia 07/28/2012  . Sialorrhea 07/28/2012  . Personal history of malignant neoplasm of bladder 07/27/2012  . Unspecified essential hypertension 07/27/2012  . Dizziness and giddiness 07/27/2012  . Parkinsonism 07/27/2012   Past Medical History:  Past Medical History  Diagnosis Date  . Hypertension   . Hyperlipidemia   . Aneurysm of infrarenal abdominal aorta SUPRAILIAC  4CM PER CT 10-20-2010--  STABLE  . Bladder cancer RECURRENT  (FIRST DX  2001)    HX TURBT AND CHEMO BLADDER INSTILLATION TX'S  . Parkinson disease   . Frequency of urination   . Nocturia   . Orthostatic hypotension   . Peripheral neuropathy    Past Surgical History:  Past Surgical History  Procedure Laterality Date  . Tonsillectomy and adenoidectomy  1938  . Left inguinal hernia repair  1996  . Transurethral resection of bladder tumor  09-11-1999    BLADDER CANCER  . Cysto/ retrograde pyelogram/ bladder bx's  07-03-2004  . Cysto/ bladder bx/ fulgeration bladder tumor  10-09-2004  . Appendectomy  10-20-2010  . Cystoscopy with biopsy  11/28/2011    Procedure: CYSTOSCOPY WITH BIOPSY;  Surgeon: Antony Haste, MD;  Location: [facility];  Service: Urology;  Laterality: N/A;  . Vasectomy    . Hip arthroplasty Right 04/09/2013    Procedure: ARTHROPLASTY BIPOLAR HIP;  Surgeon: Verlee Rossetti, MD;   Location: [facility] OR;  Service: Orthopedics;  Laterality: Right;   Social History:  reports that he quit smoking about 13 years ago. His smoking use included Cigarettes. He has a 100 pack-year smoking history. He has never used smokeless tobacco. He reports that he does not drink alcohol or use illicit drugs.  Family / Support Systems Marital Status: Married How Long?: 50 years Patient Roles: Spouse;Parent Spouse/Significant Other: Dois Davenport  [phone]  [phone] Children: Lear Ng  [phone]  [phone] Other Supports: Three other children Anticipated Caregiver: Wife Ability/Limitations of Caregiver: Wife has some respiratory issues but has assisted pt prior to admission.  can not provide physical care Caregiver Availability: 24/7 Family Dynamics: Close knit family who are willing to assist Mom with Dad.  But they do work and have their families.  Children are very involved and supportive of both parents.  Social History Preferred language: English Religion: Non-Denominational Cultural Background: No issues Education: Automotive engineer Educated Read: Yes Employment Status: Retired Fish farm manager Issues: No issues Guardian/Conservator: None-according to MD pt is capable of making his own decisions while here.   Abuse/Neglect Physical Abuse: Denies Verbal Abuse: Denies Sexual Abuse: Denies Exploitation of patient/patient's resources: Denies Self-Neglect: Denies  Emotional Status Pt's affect, behavior adn adjustment status: Pt is motivated to improve and will work hard in therapies.  He is very glad to be here and knows the next step is to go home.  He has been challenged with his Parkinson's Disease and feels this is one more challenge. Recent Psychosocial Issues: Other health issues-Parkinson's Pyschiatric History: No  history deferred depression screen due to pt felt it was not necessary.  He feels he is doing well with everything, wife agrees.  Will monitor  while here and will intervene if something changes. Substance Abuse History: No issues-quit tobacco years ago  Patient / Family Perceptions, Expectations & Goals Pt/Family understanding of illness & functional limitations: Pt and wife have a good understanding of his hip fracture and WB precautions.  Both talk with MD daily and feel they have a good understanding of his treatment plan.  Wife is here daily and will participate in his care. Premorbid pt/family roles/activities: Husband, Father, grandfather, retiree, Home owner, Church member, etc Anticipated changes in roles/activities/participation: resume Pt/family expectations/goals: Pt states: " I want to be able to move around with a walker by the time I leave here."  Wife states: " I hope he can be more mobile because I can't lift him."  [employer]: Other (Comment) (Been to OP before) Premorbid Home Care/DME Agencies: Other (Comment) (had in the past) Transportation available at discharge: Wife Resource referrals recommended: Support group (specify) (Parkinsons Support group)  Discharge Planning Living Arrangements: Spouse/significant other Support Systems: Spouse/significant other;Children;Friends/neighbors;Church/faith community Type of Residence: Private residence Insurance Resources: Media planner (specify) Theatre manager Medicare) Financial Resources: Restaurant manager, fast food Screen Referred: No Living Expenses: Lives with family Money Management: Spouse Does the patient have any problems obtaining your medications?: No Home Management: Wife Patient/Family Preliminary Plans: Return home with wife who can provide some assistance-no lifting.  Wife plans to be here daily and participate in his care.  Their children are supportive and involved. Social Work Anticipated Follow Up Needs: HH/OP;Support Group  Clinical Impression Pleasant gentleman who is motivated to improve and regain his independence.  He does  not want to burden his wife he feels she already does enough for him.  Wife is involved and willing to assist him. Both glad to be here on rehab.  Will work on a safe discharge plan.  Lucy Chris 04/15/2013, 11:13 AM

## 2013-04-15 NOTE — Progress Notes (Signed)
79 y.o. right-handed male with history of orthostatic hypotension as well as autonomic dysfunction from Parkinson's disease. Patient independent prior to admission at times using a walker. Admitted 04/09/2013 after a fall when he lost his balance landing on his right hip without loss of consciousness. X-rays and imaging revealed a displaced right femoral subcapital fracture. Underwent right hip hemiarthroplasty 04/09/2013 per Dr. [name]. Weightbearing as tolerated with posterior hip precautions. Postoperative pain management. Subcutaneous Lovenox for DVT prophylaxis. Acute blood loss anemia 10.3 and monitored. He remains on Sinemet for history of Parkinson's disease  Subjective/Complaints: No issues overnight. Denies any significant hip pain.  Review of Systems - Negative except Weakness  Objective: Vital Signs: Blood pressure 169/80, pulse 73, temperature 97.5 F (36.4 C), temperature source Oral, resp. rate 18, SpO2 94.00%. No results found. Results for orders placed during the hospital encounter of 04/14/13 (from the past 72 hour(s))  CBC     Status: Abnormal   Collection Time    04/14/13  5:30 PM      Result Value Range   WBC 11.2 (*) 4.0 - 10.5 K/uL   RBC 3.77 (*) 4.22 - 5.81 MIL/uL   Hemoglobin 11.8 (*) 13.0 - 17.0 g/dL   HCT 11.9 (*) 14.7 - 82.9 %   MCV 92.3  78.0 - 100.0 fL   MCH 31.3  26.0 - 34.0 pg   MCHC 33.9  30.0 - 36.0 g/dL   RDW 56.2  13.0 - 86.5 %   Platelets 323  150 - 400 K/uL  CREATININE, SERUM     Status: Abnormal   Collection Time    04/14/13  5:30 PM      Result Value Range   Creatinine, Ser 0.84  0.50 - 1.35 mg/dL   GFR calc non Af Amer 79 (*) >90 mL/min   GFR calc Af Amer >90  >90 mL/min   Comment: (NOTE)     The eGFR has been calculated using the CKD EPI equation.     This calculation has not been validated in all clinical situations.     eGFR's persistently <90 mL/min signify possible Chronic Kidney     Disease.  CBC WITH DIFFERENTIAL     Status:  Abnormal   Collection Time    04/15/13  5:20 AM      Result Value Range   WBC 10.6 (*) 4.0 - 10.5 K/uL   RBC 3.53 (*) 4.22 - 5.81 MIL/uL   Hemoglobin 10.9 (*) 13.0 - 17.0 g/dL   HCT 78.4 (*) 69.6 - 29.5 %   MCV 90.9  78.0 - 100.0 fL   MCH 30.9  26.0 - 34.0 pg   MCHC 34.0  30.0 - 36.0 g/dL   RDW 28.4  13.2 - 44.0 %   Platelets 290  150 - 400 K/uL   Neutrophils Relative % 71  43 - 77 %   Neutro Abs 7.6  1.7 - 7.7 K/uL   Lymphocytes Relative 15  12 - 46 %   Lymphs Abs 1.6  0.7 - 4.0 K/uL   Monocytes Relative 9  3 - 12 %   Monocytes Absolute 0.9  0.1 - 1.0 K/uL   Eosinophils Relative 5  0 - 5 %   Eosinophils Absolute 0.5  0.0 - 0.7 K/uL   Basophils Relative 1  0 - 1 %   Basophils Absolute 0.1  0.0 - 0.1 K/uL  COMPREHENSIVE METABOLIC PANEL     Status: Abnormal   Collection Time    04/15/13  5:20 AM      Result Value Range   Sodium 137  135 - 145 mEq/L   Potassium 3.5  3.5 - 5.1 mEq/L   Comment: SLIGHT HEMOLYSIS   Chloride 101  96 - 112 mEq/L   CO2 27  19 - 32 mEq/L   Glucose, Bld 107 (*) 70 - 99 mg/dL   BUN 20  6 - 23 mg/dL   Creatinine, Ser 4.78  0.50 - 1.35 mg/dL   Calcium 8.5  8.4 - 29.5 mg/dL   Total Protein 5.8 (*) 6.0 - 8.3 g/dL   Albumin 2.5 (*) 3.5 - 5.2 g/dL   AST 38 (*) 0 - 37 U/L   Comment: SLIGHT HEMOLYSIS   ALT <5  0 - 53 U/L   Alkaline Phosphatase 60  39 - 117 U/L   Total Bilirubin 1.2  0.3 - 1.2 mg/dL   GFR calc non Af Amer 84 (*) >90 mL/min   GFR calc Af Amer >90  >90 mL/min   Comment: (NOTE)     The eGFR has been calculated using the CKD EPI equation.     This calculation has not been validated in all clinical situations.     eGFR's persistently <90 mL/min signify possible Chronic Kidney     Disease.     HEENT: normal Cardio: RRR and No murmurs Resp: CTA B/L and Unlabored GI: BS positive and Nondistended, nontender Extremity:  No Edema Skin:   Intact and Wound Right hip with staples Neuro: Alert/Oriented, Flat, Cranial Nerve II-XII normal, Normal  Sensory and Abnormal Motor 4 minus right hip flexor knee extensor and 5/5 ankle dorsiflexor plantar flexor5/5 left hip flexor knee extensor ankle the flexor plantarflexion5/5 bilateral upper extremity Musc/Skel:  Swelling Left thigh, no erythema or tenderness General no acute distress   Assessment/Plan: 1. Functional deficits secondary to  right femoral neck fracture in a patient with Parkinson's disease after a fall 04/09/2013  which require 3+ hours per day of interdisciplinary therapy in a comprehensive inpatient rehab setting. Physiatrist is providing close team supervision and 24 hour management of active medical problems listed below. Physiatrist and rehab team continue to assess barriers to discharge/monitor patient progress toward functional and medical goals. FIM:          FIM - Diplomatic Services operational officer Devices: Bedside commode Toilet Transfers: 2-To toilet/BSC: Max A (lift and lower assist);2-From toilet/BSC: Max A (lift and lower assist)        Comprehension Comprehension Mode: Auditory Comprehension: 7-Follows complex conversation/direction: With no assist  Expression Expression Mode: Verbal Expression: 7-Expresses complex ideas: With no assist  Social Interaction Social Interaction: 7-Interacts appropriately with others - No medications needed.  Problem Solving Problem Solving: 6-Solves complex problems: With extra time  Memory Memory: 6-More than reasonable amt of time  Medical Problem List and Plan:  1. Displaced right femoral neck fracture. Status post hemiarthroplasty 04/09/2013  2. DVT Prophylaxis/Anticoagulation: Subcutaneous Lovenox. Check vascular studies and  3. Pain Management: Hydrocodone and Robaxin as needed. Monitor with increased mobility  4. Neuropsych: This patient is capable of making decisions on his own behalf.  5. Acute blood loss anemia. Continue iron supplement followup CBC  6. History of orthostatic hypotension.  Continue Florinef. Monitor closely with increased mobility  7. Parkinson's disease. Sinemet as directed as well as Eldepryl 5 mg twice a day.Follow up Dr. Terrace Arabia  8.BPH. Continue Avodart as well as Flomax. Need to watch blood pressures closely. Discuss home regimen with patient. Check PVRs x3  9. Hyperlipidemia. Lipitor   10. Dysphagia secondary to Parkinson's disease plus Botox injection   LOS (Days) 1 A FACE TO FACE EVALUATION WAS PERFORMED  Akesha Uresti E 04/15/2013, 7:44 AM

## 2013-04-15 NOTE — Evaluation (Signed)
Speech Language Pathology Assessment and Plan  Patient Details  Name: Ivan Clark MRN: [id number] Date of Birth: 05/02/1929  SLP Diagnosis:   baseline of mild dysarthria and dysphagia Rehab Potential:  N/A ELOS:   N/A  Today's Date: 04/15/2013 Time: 1107-1200 Time Calculation (min): 53 min  Problem List:  Patient Active Problem List   Diagnosis Date Noted  . Fracture of femoral neck, right 04/14/2013  . Right femoral fracture 04/09/2013  . Hypertension 04/09/2013  . Hyperlipidemia 04/09/2013  . BPH (benign prostatic hypertrophy) 04/09/2013  . Closed right hip fracture 04/09/2013  . Hip fracture, right 04/09/2013  . Femoral neck fracture 04/09/2013  . Dysphagia, pharyngeal 03/02/2013  . Cervical dystonia 07/28/2012  . Sialorrhea 07/28/2012  . Personal history of malignant neoplasm of bladder 07/27/2012  . Unspecified essential hypertension 07/27/2012  . Dizziness and giddiness 07/27/2012  . Parkinsonism 07/27/2012   Past Medical History:  Past Medical History  Diagnosis Date  . Hypertension   . Hyperlipidemia   . Aneurysm of infrarenal abdominal aorta SUPRAILIAC  4CM PER CT 10-20-2010--  STABLE  . Bladder cancer RECURRENT  (FIRST DX  2001)    HX TURBT AND CHEMO BLADDER INSTILLATION TX'S  . Parkinson disease   . Frequency of urination   . Nocturia   . Orthostatic hypotension   . Peripheral neuropathy    Past Surgical History:  Past Surgical History  Procedure Laterality Date  . Tonsillectomy and adenoidectomy  1938  . Left inguinal hernia repair  1996  . Transurethral resection of bladder tumor  09-11-1999    BLADDER CANCER  . Cysto/ retrograde pyelogram/ bladder bx's  07-03-2004  . Cysto/ bladder bx/ fulgeration bladder tumor  10-09-2004  . Appendectomy  10-20-2010  . Cystoscopy with biopsy  11/28/2011    Procedure: CYSTOSCOPY WITH BIOPSY;  Surgeon: Antony Haste, MD;  Location: [facility];  Service: Urology;  Laterality: N/A;  .  Vasectomy    . Hip arthroplasty Right 04/09/2013    Procedure: ARTHROPLASTY BIPOLAR HIP;  Surgeon: Verlee Rossetti, MD;  [facility] OR;  Service: Orthopedics;  Laterality: Right;    Assessment / Plan / Recommendation Clinical Impression  Pt is a 79 y.o. right-handed male with history of orthostatic hypotension as well as autonomic dysfunction from Parkinson's disease. Patient independent PTA, at times using a walker. Admitted 04/09/13 after a fall when he lost his balance landing on his right hip without loss of consciousness. X-rays and imaging revealed a displaced right femoral subcapital fracture. Underwent right hip hemiarthroplasty 04/09/13 per Dr. [name]. Weightbearing as tolerated with posterior hip precautions. Postoperative pain management. Subcutaneous Lovenox for DVT prophylaxis. Acute blood loss anemia 10.3 and monitored. He remains on Sinemet for history of Parkinson's disease. PT/OT evaluations completed 04/10/13 with recommendations for physical medicine rehabilitation consult to consider inpatient rehabilitation services. Patient was felt to be a good candidate for inpatient rehabilitation services and was admitted for comprehensive rehabilitation program 12/4; SLP bedside swallow and cognitive-linguistic evaluations completed 12/5. Pt presents with a baseline of dysphagia, consuming dys 3 textures and thin liquids which he appears to be tolerating with adequate awareness of safe swallowing strategies. He also presents with a mild baseline dysarthria, which he has received prior OP SLP services for, and is marked by mildly decreased vocal intensity and breath support that does not impact speech intelligibility at the conversational level. He appears to be within gross functional limits with cognitive-linguistic function. Pt appears to be at his baseline level of  functioning and therefore no further SLP f/u is recommend at this time.     SLP Assessment  Patient does not need any further  Speech Lanaguage Pathology Services    Recommendations  Diet Recommendations: Dysphagia 3 (Mechanical Soft);Thin liquid Liquid Administration via: Cup;Straw;No straw Medication Administration: Whole meds with puree Supervision: Patient able to self feed;Intermittent supervision to cue for compensatory strategies Compensations: Slow rate;Small sips/bites;Multiple dry swallows after each bite/sip;Clear throat intermittently Postural Changes and/or Swallow Maneuvers: Seated upright 90 degrees Oral Care Recommendations: Oral care BID    SLP Frequency   N/A  SLP Treatment/Interventions   N/A   Pain Pain Assessment Pain Assessment: No/denies pain Prior Functioning Cognitive/Linguistic Baseline: Baseline deficits (pt with h/o PD, per pt has had speech therapy in the past for dysarthria) Baseline deficit details: Per pt has had speech therapy in the past for dysarthria secondary to dx of PD Type of Home: House  Lives With: Spouse Available Help at Discharge: Family;Available 24 hours/day Vocation: Retired  Teacher, music Term Goals: Week 1:  N/A  See FIM for current functional status Refer to Care Plan for Long Term Goals  Recommendations for other services: None  Discharge Criteria: Patient will be discharged from SLP if patient refuses treatment 3 consecutive times without medical reason, if treatment goals not met, if there is a change in medical status, if patient makes no progress towards goals or if patient is discharged from hospital.  The above assessment, treatment plan, treatment alternatives and goals were discussed and mutually agreed upon: by patient   Maxcine Ham, M.A. CCC-SLP [phone]   Maxcine Ham 04/15/2013, 12:28 PM

## 2013-04-15 NOTE — Progress Notes (Signed)
Physical Therapy Session Note  Patient Details  Name: Ivan Clark MRN: [id number] Date of Birth: 06-13-1928  Today's Date: 04/15/2013 Time: 1400-1430 Time Calculation (min): 30 min  Short Term Goals: Week 1:  PT Short Term Goal 1 (Week 1): Pt will perform bed mobility on flat bed, no rail to L and R with min A and 25% cues for hip precautions PT Short Term Goal 2 (Week 1): Pt will perform stand pivot transfers bed <> chair with min A overall and 25% cues for hip precautions PT Short Term Goal 3 (Week 1): Pt will perform w/c mobility x 150' in controlled environment with min A  PT Short Term Goal 4 (Week 1): Pt will be able to sit upright in supported sitting and maintain head/trunk positioning without soft neck collar x 5 minutes  PT Short Term Goal 5 (Week 1): Pt will perform gait x 25' without AD and 100' with RW with min A and improved weight shifting, step and stride length and decreased shuffling  Skilled Therapeutic Interventions/Progress Updates:  Pt received in w/c with spouse present. Pt and spouse education on Hip precautions with written handout provided, first trial 1/3 correct and by trial 4 able to get 3/3 correct, will however require continued reinforcement. Sit to stand with instruction to have R LE slightly forward, R UE on armrest and L on RW to improve technique and independence, by trial 3 pt required min A to complete task. Gait with RW x 60 feet with min A with good advancement of R LE. Pt returned to room in w/c with all needs within reach, with spouse present.  Therapy Documentation Precautions:  Precautions Precautions: Posterior Hip;Fall Restrictions Weight Bearing Restrictions: Yes RLE Weight Bearing: Weight bearing as tolerated    Pain: No pain/ denies pain          Trunk/Postural Assessment : Cervical Assessment Cervical Assessment: Exceptions to [facility] (forward head, R lateral flexion, L rotation, rigidity) Thoracic Assessment Thoracic  Assessment: Exceptions to [facility] (kyphotic) Lumbar Assessment Lumbar Assessment: Exceptions to [facility] (decreased lordosis; sacral sitting) Postural Control Postural Control: Deficits on evaluation (LOB posterior; decreased/delayed ankle and hip strategies)  Balance: Static Sitting Balance Static Sitting - Balance Support: Feet supported;Bilateral upper extremity supported Static Sitting - Level of Assistance: 6: Modified independent (Device/Increase time) Static Sitting - Comment/# of Minutes: With back supported Dynamic Sitting Balance Dynamic Sitting - Balance Support: Right upper extremity supported;Left upper extremity supported;Feet supported Dynamic Sitting - Level of Assistance: 3: Mod assist Dynamic Sitting - Comments: Assistance needed to lean away from back of w/c and maintain Static Standing Balance Static Standing - Balance Support: Right upper extremity supported;Left upper extremity supported Static Standing - Level of Assistance: 4: Min assist Dynamic Standing Balance Dynamic Standing - Balance Support: Right upper extremity supported;Left upper extremity supported Dynamic Standing - Level of Assistance: 3: Mod assist     See FIM for current functional status  Therapy/Group: Individual Therapy  Jackelyn Knife 04/15/2013, 4:25 PM

## 2013-04-15 NOTE — IPOC Note (Addendum)
Overall Plan of Care [facility]) Patient Details Name: LENNYN BELLANCA MRN: [id number] DOB: May 17, 1928  Admitting Diagnosis: FEMORAL FX/PARKINSONS DZ  Hospital Problems: Principal Problem:   Fracture of femoral neck, right Active Problems:   Femoral neck fracture     Functional Problem List: Nursing Bowel;Medication Management;Pain;Safety;Skin Integrity  PT Balance;Endurance;Motor;Sensory;Other (comment);Pain (Posture)  OT Balance;Endurance;Safety  SLP    TR         Basic ADL's: OT Grooming;Bathing;Dressing;Toileting     Advanced  ADL's: OT  n/a     Transfers: PT Bed Mobility;Bed to Chair;Car;Furniture  OT Toilet;Tub/Shower     Locomotion: PT Ambulation;Stairs;Wheelchair Mobility     Additional Impairments: OT  n/a  SLP        TR      Anticipated Outcomes Item Anticipated Outcome  Self Feeding    Swallowing      Basic self-care  Supervision, Min assist with LB dressing (wife performed PTA)  Toileting  Supervision   Bathroom Transfers Supervision  Bowel/Bladder  remain continent of bowel and bladder with minimal assist  Transfers  supervision  Locomotion  supervision  Communication     Cognition     Pain  3 or less out of 10  Safety/Judgment  supervision   Therapy Plan: PT Intensity: Minimum of 1-2 x/day ,45 to 90 minutes PT Frequency: 5 out of 7 days PT Duration Estimated Length of Stay: 12-14 days OT Intensity: Minimum of 1-2 x/day, 45 to 90 minutes OT Frequency: 5 out of 7 days OT Duration/Estimated Length of Stay: 10-12 days         Team Interventions: Nursing Interventions Patient/Family Education;Bowel Management;Disease Management/Prevention;Pain Management;Medication Management;Skin Care/Wound Management  PT interventions Ambulation/gait training;Balance/vestibular training;Discharge planning;Disease management/prevention;DME/adaptive equipment instruction;Functional mobility training;Neuromuscular re-education;Pain  management;Patient/family education;Stair training;Therapeutic Activities;Therapeutic Exercise;UE/LE Strength taining/ROM;UE/LE Coordination activities;Wheelchair propulsion/positioning  OT Interventions Social worker;Discharge planning;Functional mobility training;Patient/family education;Self Care/advanced ADL retraining;Therapeutic Activities  SLP Interventions    TR Interventions    SW/CM Interventions Discharge Planning;Psychosocial Support;Patient/Family Education    Team Discharge Planning: Destination: PT-Home ,OT-  Home , SLP-  Projected Follow-up: PT-Outpatient PT;24 hour supervision/assistance (Parkinson's balance/gait program at outpatient), OT- Outpatient OT vs HHOT; 27 hour supervision/assistance  , SLP-  Projected Equipment Needs: PT-Rolling walker with 5" wheels;Wheelchair cushion (measurements);Wheelchair (measurements), OT- 3 in 1  , SLP-  Equipment Details: PT- , OT-  Patient/family involved in discharge planning: PT- Patient;Family member/caregiver,  OT-Patient, SLP-Patient  MD ELOS: 12 days Medical Rehab Prognosis:  Excellent Assessment: The patient has been admitted for CIR therapies. The team will be addressing, functional mobility, strength, stamina, balance, safety, adaptive techniques/equipment, self-care, bowel and bladder mgt, patient and caregiver education, ortho precautions, pain mgt. Goals have been set at supervision.    Ranelle Oyster, MD, FAAPMR      See Team Conference Notes for weekly updates to the plan of care

## 2013-04-16 DIAGNOSIS — G030 Nonpyogenic meningitis: Secondary | ICD-10-CM

## 2013-04-16 DIAGNOSIS — S72009A Fracture of unspecified part of neck of unspecified femur, initial encounter for closed fracture: Secondary | ICD-10-CM

## 2013-04-16 NOTE — Plan of Care (Signed)
Problem: RH PAIN MANAGEMENT Goal: RH STG PAIN MANAGED AT OR BELOW PT'S PAIN GOAL 3 or less out of 10  Outcome: Progressing Reports pain, if present, is very mild and doesn't require analgesics

## 2013-04-16 NOTE — Progress Notes (Signed)
79 y.o. right-handed male with history of orthostatic hypotension as well as autonomic dysfunction from Parkinson's disease. Patient independent prior to admission at times using a walker. Admitted 04/09/2013 after a fall when he lost his balance landing on his right hip without loss of consciousness. X-rays and imaging revealed a displaced right femoral subcapital fracture. Underwent right hip hemiarthroplasty 04/09/2013 per Dr. [name]. Weightbearing as tolerated with posterior hip precautions. Postoperative pain management. Subcutaneous Lovenox for DVT prophylaxis. Acute blood loss anemia 10.3 and monitored. He remains on Sinemet for history of Parkinson's disease  Subjective/Complaints: Had a good night. Right hip a little sore. Not really using anything for pain.  Review of Systems - Negative except Weakness  Objective: Vital Signs: Blood pressure 174/69, pulse 79, temperature 98.3 F (36.8 C), temperature source Oral, resp. rate 18, SpO2 93.00%. No results found. Results for orders placed during the hospital encounter of 04/14/13 (from the past 72 hour(s))  CBC     Status: Abnormal   Collection Time    04/14/13  5:30 PM      Result Value Range   WBC 11.2 (*) 4.0 - 10.5 K/uL   RBC 3.77 (*) 4.22 - 5.81 MIL/uL   Hemoglobin 11.8 (*) 13.0 - 17.0 g/dL   HCT 16.1 (*) 09.6 - 04.5 %   MCV 92.3  78.0 - 100.0 fL   MCH 31.3  26.0 - 34.0 pg   MCHC 33.9  30.0 - 36.0 g/dL   RDW 40.9  81.1 - 91.4 %   Platelets 323  150 - 400 K/uL  CREATININE, SERUM     Status: Abnormal   Collection Time    04/14/13  5:30 PM      Result Value Range   Creatinine, Ser 0.84  0.50 - 1.35 mg/dL   GFR calc non Af Amer 79 (*) >90 mL/min   GFR calc Af Amer >90  >90 mL/min   Comment: (NOTE)     The eGFR has been calculated using the CKD EPI equation.     This calculation has not been validated in all clinical situations.     eGFR's persistently <90 mL/min signify possible Chronic Kidney     Disease.  CBC WITH  DIFFERENTIAL     Status: Abnormal   Collection Time    04/15/13  5:20 AM      Result Value Range   WBC 10.6 (*) 4.0 - 10.5 K/uL   RBC 3.53 (*) 4.22 - 5.81 MIL/uL   Hemoglobin 10.9 (*) 13.0 - 17.0 g/dL   HCT 78.2 (*) 95.6 - 21.3 %   MCV 90.9  78.0 - 100.0 fL   MCH 30.9  26.0 - 34.0 pg   MCHC 34.0  30.0 - 36.0 g/dL   RDW 08.6  57.8 - 46.9 %   Platelets 290  150 - 400 K/uL   Neutrophils Relative % 71  43 - 77 %   Neutro Abs 7.6  1.7 - 7.7 K/uL   Lymphocytes Relative 15  12 - 46 %   Lymphs Abs 1.6  0.7 - 4.0 K/uL   Monocytes Relative 9  3 - 12 %   Monocytes Absolute 0.9  0.1 - 1.0 K/uL   Eosinophils Relative 5  0 - 5 %   Eosinophils Absolute 0.5  0.0 - 0.7 K/uL   Basophils Relative 1  0 - 1 %   Basophils Absolute 0.1  0.0 - 0.1 K/uL  COMPREHENSIVE METABOLIC PANEL     Status: Abnormal  Collection Time    04/15/13  5:20 AM      Result Value Range   Sodium 137  135 - 145 mEq/L   Potassium 3.5  3.5 - 5.1 mEq/L   Comment: SLIGHT HEMOLYSIS   Chloride 101  96 - 112 mEq/L   CO2 27  19 - 32 mEq/L   Glucose, Bld 107 (*) 70 - 99 mg/dL   BUN 20  6 - 23 mg/dL   Creatinine, Ser 4.09  0.50 - 1.35 mg/dL   Calcium 8.5  8.4 - 81.1 mg/dL   Total Protein 5.8 (*) 6.0 - 8.3 g/dL   Albumin 2.5 (*) 3.5 - 5.2 g/dL   AST 38 (*) 0 - 37 U/L   Comment: SLIGHT HEMOLYSIS   ALT <5  0 - 53 U/L   Alkaline Phosphatase 60  39 - 117 U/L   Total Bilirubin 1.2  0.3 - 1.2 mg/dL   GFR calc non Af Amer 84 (*) >90 mL/min   GFR calc Af Amer >90  >90 mL/min   Comment: (NOTE)     The eGFR has been calculated using the CKD EPI equation.     This calculation has not been validated in all clinical situations.     eGFR's persistently <90 mL/min signify possible Chronic Kidney     Disease.     HEENT: normal Cardio: RRR and No murmurs Resp: CTA B/L and Unlabored GI: BS positive and Nondistended, nontender Extremity:  No Edema Skin:   Intact and Wound Right hip with staples Neuro: Alert/Oriented, Flat, Cranial  Nerve II-XII normal, Normal Sensory and Abnormal Motor 4 minus right hip flexor knee extensor and 5/5 ankle dorsiflexor plantar flexor5/5 left hip flexor knee extensor ankle the flexor plantarflexion5/5 bilateral upper extremity. Mild intentional tremor Musc/Skel:  Swelling Left thigh, no erythema or tenderness. No drainage. Head forward position General no acute distress   Assessment/Plan: 1. Functional deficits secondary to  right femoral neck fracture in a patient with Parkinson's disease after a fall 04/09/2013  which require 3+ hours per day of interdisciplinary therapy in a comprehensive inpatient rehab setting. Physiatrist is providing close team supervision and 24 hour management of active medical problems listed below. Physiatrist and rehab team continue to assess barriers to discharge/monitor patient progress toward functional and medical goals. FIM: FIM - Bathing Bathing Steps Patient Completed: Chest;Right Arm;Left Arm;Abdomen;Front perineal area;Buttocks;Right upper leg;Left upper leg Bathing: 4: Min-Patient completes 8-9 51f 10 parts or 75+ percent  FIM - Upper Body Dressing/Undressing Upper body dressing/undressing steps patient completed: Thread/unthread right sleeve of pullover shirt/dresss;Thread/unthread left sleeve of pullover shirt/dress;Put head through opening of pull over shirt/dress;Pull shirt over trunk Upper body dressing/undressing: 5: Set-up assist to: Obtain clothing/put away FIM - Lower Body Dressing/Undressing Lower body dressing/undressing steps patient completed: Thread/unthread left underwear leg;Thread/unthread left pants leg;Pull pants up/down Lower body dressing/undressing: 2: Max-Patient completed 25-49% of tasks  FIM - Toileting Toileting steps completed by patient: Performs perineal hygiene Toileting: 1: Total-Patient completed zero steps, helper did all 3  FIM - Diplomatic Services operational officer Devices: Bedside commode Toilet Transfers:  2-To toilet/BSC: Max A (lift and lower assist);2-From toilet/BSC: Max A (lift and lower assist)  FIM - Banker Devices: Arm rests Bed/Chair Transfer: 4: Supine > Sit: Min A (steadying Pt. > 75%/lift 1 leg);2: Bed > Chair or W/C: Max A (lift and lower assist)  FIM - Locomotion: Wheelchair Distance: 100 Locomotion: Wheelchair: 1: Total Assistance/staff pushes wheelchair (Pt<25%) FIM - Locomotion:  Ambulation Ambulation/Gait Assistance: 3: Mod assist Locomotion: Ambulation: 1: Travels less than 50 ft with moderate assistance (Pt: 50 - 74%)  Comprehension Comprehension Mode: Auditory Comprehension: 6-Follows complex conversation/direction: With extra time/assistive device  Expression Expression Mode: Verbal Expression: 6-Expresses complex ideas: With extra time/assistive device  Social Interaction Social Interaction: 7-Interacts appropriately with others - No medications needed.  Problem Solving Problem Solving: 6-Solves complex problems: With extra time  Memory Memory: 6-More than reasonable amt of time  Medical Problem List and Plan:  1. Displaced right femoral neck fracture. Status post hemiarthroplasty 04/09/2013  2. DVT Prophylaxis/Anticoagulation: Subcutaneous Lovenox. Check vascular studies and  3. Pain Management: Hydrocodone and Robaxin as needed. Monitor with increased mobility  4. Neuropsych: This patient is capable of making decisions on his own behalf.  5. Acute blood loss anemia. Continue iron supplement  6. History of orthostatic hypotension. Continue Florinef. Monitor closely with increased mobility  7. Parkinson's disease. Sinemet as directed as well as Eldepryl 5 mg twice a day.Follow up Dr. Terrace Arabia  8.BPH. Continue Avodart as well as Flomax. Need to watch blood pressures closely. Discuss home regimen with patient. Check PVRs x3  9. Hyperlipidemia. Lipitor   10. Dysphagia secondary to Parkinson's disease plus Botox injection    LOS (Days) 2 A FACE TO FACE EVALUATION WAS PERFORMED  SWARTZ,ZACHARY T 04/16/2013, 7:44 AM

## 2013-04-17 ENCOUNTER — Inpatient Hospital Stay (HOSPITAL_COMMUNITY): Payer: Medicare Other | Admitting: Physical Therapy

## 2013-04-17 ENCOUNTER — Inpatient Hospital Stay (HOSPITAL_COMMUNITY): Payer: Medicare Other | Admitting: Occupational Therapy

## 2013-04-17 NOTE — Progress Notes (Signed)
Occupational Therapy Session Note  Patient Details  Name: Ivan Clark MRN: [id number] Date of Birth: 10-18-1928  Today's Date: 04/17/2013 Time: 7829-5621 Time Calculation (min): 60 min  Skilled Therapeutic Interventions/Progress Updates: Patient transferred supine to EOB with cues and Mod A; bed to chair with extra time and Min A.  He completed oral care and grooming and  Batheing and dressing sit to stand in w/c at sink with extra time and fatigued with all movements.  Though patient was too fatigued and out of his schedule allotted time to complete LB bathing and dressing, he was able to stand at sink for about 5 minutes to wash periarea with fair balance and with hyper flexed cervical neck.  He stated that he did not feel the need for his soft collar during the ADL, but he donned after the session was over (in order to support his neck for sitting up until lunch).  He attempted to wash buttocks, but it tended to cause him to internally rotate at hips.  Patient stated that his wife will bring back his AE kit so that he can use his long handled sponge to wash buttocks and feet, etc.     Therapy Documentation Precautions:  Precautions Precautions: Posterior Hip;Fall Restrictions Weight Bearing Restrictions: Yes RLE Weight Bearing: Weight bearing as tolerated   Pain: denied   See FIM for current functional status  Therapy/Group: Individual Therapy  Bud Face [facility] 04/17/2013, 12:45 PM

## 2013-04-17 NOTE — Progress Notes (Signed)
Occupational Therapy Session Note  Patient Details  Name: RAEL YO MRN: [id number] Date of Birth: 1929/03/20  Today's Date: 04/17/2013 Time: 4098-1191 Time Calculation (min): 45 min  Skilled Therapeutic Interventions/Progress Updates: Patient and wife seen this afternoon for Adaptive Equipment (AE) education.  He was able to use reacher and sockaide with mod A and cues to don and doff shoes, underwear and pants.  His wife offered a few suggestions to her husband during the training session.  Patient very SOB during the session; however when O2 and Heart rate checked, they will within normal limits of O2 was 98 and Heart rate was 82.  Patient only required one cue to not let his right knee or right toes lean or rotate internally during the AE use.    Therapy Documentation Precautions:  Precautions Precautions: Posterior Hip;Fall Restrictions Weight Bearing Restrictions: Yes RLE Weight Bearing: Weight bearing as tolerated  Pain: denied   See FIM for current functional status  Therapy/Group: Individual Therapy  Bud Face [facility] 04/17/2013, 3:57 PM

## 2013-04-17 NOTE — Progress Notes (Signed)
Physical Therapy Note  Patient Details  Name: Ivan Clark MRN: [id number] Date of Birth: Sep 07, 1928 Today's Date: 04/17/2013  1055-1150 (55 minutes) individual Pain: no reported pain Precautions: THA (posterior) WBAT RT LE (pt recalls 2/3) Focus of treatment : Therapeutic exercises focused on bilateral LE strengthening/ AROM post RT THA ; gait training  Treatment: Pt in wc upon arrival; sit to stand from wc min to SBA with vcs for hand placement; sit to supine (mat) SBA with increased difficulty RT LE; supine to sit (mat) SBA with increased time; therapeutic exercises X 20  Bilateral LEs - ankle pumps, heel slides, hip abduction (AA on right), SAQs; gait 30 feet x 2 RW SBA ; returned to room with all needs within reach.  1300-1325 (25 minutes) individual Pain: no complaint of pain Focus of treatment: Gait training / steps/ curb Treatment: Pt up in wc upon arrival; wc mobility - 120 feet SBA on level surfaces with increased time; gait up/down 2-3 steps with bilateral rails SBA (pt recalls sequencing without cues); up/down curb with RW min to SBA. ; wc >< practice car RW min assist to place LEs in car and min sit to strand from low surface.    Sylena Lotter,JIM 04/17/2013, 11:26 AM

## 2013-04-17 NOTE — Progress Notes (Signed)
79 y.o. right-handed male with history of orthostatic hypotension as well as autonomic dysfunction from Parkinson's disease. Patient independent prior to admission at times using a walker. Admitted 04/09/2013 after a fall when he lost his balance landing on his right hip without loss of consciousness. X-rays and imaging revealed a displaced right femoral subcapital fracture. Underwent right hip hemiarthroplasty 04/09/2013 per Dr. [name]. Weightbearing as tolerated with posterior hip precautions. Postoperative pain management. Subcutaneous Lovenox for DVT prophylaxis. Acute blood loss anemia 10.3 and monitored. He remains on Sinemet for history of Parkinson's disease  Subjective/Complaints: No complaints. Eating breakfast, has good appetite. Pain controlled Review of Systems - Negative except Weakness  Objective: Vital Signs: Blood pressure 179/78, pulse 78, temperature 98 F (36.7 C), temperature source Oral, resp. rate 18, SpO2 93.00%. No results found. Results for orders placed during the hospital encounter of 04/14/13 (from the past 72 hour(s))  CBC     Status: Abnormal   Collection Time    04/14/13  5:30 PM      Result Value Range   WBC 11.2 (*) 4.0 - 10.5 K/uL   RBC 3.77 (*) 4.22 - 5.81 MIL/uL   Hemoglobin 11.8 (*) 13.0 - 17.0 g/dL   HCT 16.1 (*) 09.6 - 04.5 %   MCV 92.3  78.0 - 100.0 fL   MCH 31.3  26.0 - 34.0 pg   MCHC 33.9  30.0 - 36.0 g/dL   RDW 40.9  81.1 - 91.4 %   Platelets 323  150 - 400 K/uL  CREATININE, SERUM     Status: Abnormal   Collection Time    04/14/13  5:30 PM      Result Value Range   Creatinine, Ser 0.84  0.50 - 1.35 mg/dL   GFR calc non Af Amer 79 (*) >90 mL/min   GFR calc Af Amer >90  >90 mL/min   Comment: (NOTE)     The eGFR has been calculated using the CKD EPI equation.     This calculation has not been validated in all clinical situations.     eGFR's persistently <90 mL/min signify possible Chronic Kidney     Disease.  CBC WITH DIFFERENTIAL      Status: Abnormal   Collection Time    04/15/13  5:20 AM      Result Value Range   WBC 10.6 (*) 4.0 - 10.5 K/uL   RBC 3.53 (*) 4.22 - 5.81 MIL/uL   Hemoglobin 10.9 (*) 13.0 - 17.0 g/dL   HCT 78.2 (*) 95.6 - 21.3 %   MCV 90.9  78.0 - 100.0 fL   MCH 30.9  26.0 - 34.0 pg   MCHC 34.0  30.0 - 36.0 g/dL   RDW 08.6  57.8 - 46.9 %   Platelets 290  150 - 400 K/uL   Neutrophils Relative % 71  43 - 77 %   Neutro Abs 7.6  1.7 - 7.7 K/uL   Lymphocytes Relative 15  12 - 46 %   Lymphs Abs 1.6  0.7 - 4.0 K/uL   Monocytes Relative 9  3 - 12 %   Monocytes Absolute 0.9  0.1 - 1.0 K/uL   Eosinophils Relative 5  0 - 5 %   Eosinophils Absolute 0.5  0.0 - 0.7 K/uL   Basophils Relative 1  0 - 1 %   Basophils Absolute 0.1  0.0 - 0.1 K/uL  COMPREHENSIVE METABOLIC PANEL     Status: Abnormal   Collection Time    04/15/13  5:20 AM      Result Value Range   Sodium 137  135 - 145 mEq/L   Potassium 3.5  3.5 - 5.1 mEq/L   Comment: SLIGHT HEMOLYSIS   Chloride 101  96 - 112 mEq/L   CO2 27  19 - 32 mEq/L   Glucose, Bld 107 (*) 70 - 99 mg/dL   BUN 20  6 - 23 mg/dL   Creatinine, Ser 4.09  0.50 - 1.35 mg/dL   Calcium 8.5  8.4 - 81.1 mg/dL   Total Protein 5.8 (*) 6.0 - 8.3 g/dL   Albumin 2.5 (*) 3.5 - 5.2 g/dL   AST 38 (*) 0 - 37 U/L   Comment: SLIGHT HEMOLYSIS   ALT <5  0 - 53 U/L   Alkaline Phosphatase 60  39 - 117 U/L   Total Bilirubin 1.2  0.3 - 1.2 mg/dL   GFR calc non Af Amer 84 (*) >90 mL/min   GFR calc Af Amer >90  >90 mL/min   Comment: (NOTE)     The eGFR has been calculated using the CKD EPI equation.     This calculation has not been validated in all clinical situations.     eGFR's persistently <90 mL/min signify possible Chronic Kidney     Disease.     HEENT: normal Cardio: RRR and No murmurs Resp: CTA B/L and Unlabored GI: BS positive and Nondistended, nontender Extremity:  No Edema Skin:   Intact and Wound Right hip Neuro: Alert/Oriented, Flat, Cranial Nerve II-XII normal, Normal  Sensory and Abnormal Motor 4 minus right hip flexor knee extensor and 5/5 ankle dorsiflexor plantar flexor5/5 left hip flexor knee extensor ankle the flexor plantarflexion5/5 bilateral upper extremity. Mild intentional tremor Musc/Skel:  Swelling Left thigh, no erythema or tenderness. No drainage. Head forward position General no acute distress   Assessment/Plan: 1. Functional deficits secondary to  right femoral neck fracture in a patient with Parkinson's disease after a fall 04/09/2013  which require 3+ hours per day of interdisciplinary therapy in a comprehensive inpatient rehab setting. Physiatrist is providing close team supervision and 24 hour management of active medical problems listed below. Physiatrist and rehab team continue to assess barriers to discharge/monitor patient progress toward functional and medical goals. FIM: FIM - Bathing Bathing Steps Patient Completed: Chest;Right Arm;Left Arm;Abdomen;Front perineal area;Buttocks;Right upper leg;Left upper leg Bathing: 0: Activity did not occur  FIM - Upper Body Dressing/Undressing Upper body dressing/undressing steps patient completed: Thread/unthread right sleeve of pullover shirt/dresss;Thread/unthread left sleeve of pullover shirt/dress;Put head through opening of pull over shirt/dress;Pull shirt over trunk Upper body dressing/undressing: 0: Activity did not occur FIM - Lower Body Dressing/Undressing Lower body dressing/undressing steps patient completed: Thread/unthread left underwear leg;Thread/unthread left pants leg;Pull pants up/down Lower body dressing/undressing: 0: Activity did not occur  FIM - Toileting Toileting steps completed by patient: Performs perineal hygiene Toileting: 1: Total-Patient completed zero steps, helper did all 3  FIM - Diplomatic Services operational officer Devices: Psychiatrist Transfers: 0-Activity did not occur  FIM - Banker Devices:  Arm rests;Bed rails Bed/Chair Transfer: 3: Bed > Chair or W/C: Mod A (lift or lower assist);3: Supine > Sit: Mod A (lifting assist/Pt. 50-74%/lift 2 legs;3: Chair or W/C > Bed: Mod A (lift or lower assist)  FIM - Locomotion: Wheelchair Distance: 100 Locomotion: Wheelchair: 0: Activity did not occur FIM - Locomotion: Ambulation Ambulation/Gait Assistance: 3: Mod assist Locomotion: Ambulation: 0: Activity did not occur  Comprehension Comprehension Mode: Auditory  Comprehension: 6-Follows complex conversation/direction: With extra time/assistive device  Expression Expression Mode: Verbal Expression: 6-Expresses complex ideas: With extra time/assistive device  Social Interaction Social Interaction: 7-Interacts appropriately with others - No medications needed.  Problem Solving Problem Solving: 6-Solves complex problems: With extra time  Memory Memory: 6-More than reasonable amt of time  Medical Problem List and Plan:  1. Displaced right femoral neck fracture. Status post hemiarthroplasty 04/09/2013  2. DVT Prophylaxis/Anticoagulation: Subcutaneous Lovenox.    3. Pain Management: Hydrocodone and Robaxin as needed. Monitor with increased mobility  4. Neuropsych: This patient is capable of making decisions on his own behalf.  5. Acute blood loss anemia. Continue iron supplement  6. History of orthostatic hypotension. Continue Florinef. Resting bp's high--probably need to tolerate these given orthostasis 7. Parkinson's disease. Sinemet as directed as well as Eldepryl 5 mg twice a day.Follow up Dr. Terrace Arabia  8.BPH. Continue Avodart as well as Flomax. Need to watch blood pressures closely. Discuss home regimen with patient. Check PVRs x3  9. Hyperlipidemia. Lipitor   10. Dysphagia secondary to Parkinson's disease plus Botox injection   LOS (Days) 3 A FACE TO FACE EVALUATION WAS PERFORMED  Latasha Puskas T 04/17/2013, 7:36 AM

## 2013-04-18 ENCOUNTER — Inpatient Hospital Stay (HOSPITAL_COMMUNITY): Payer: Medicare Other | Admitting: Occupational Therapy

## 2013-04-18 ENCOUNTER — Inpatient Hospital Stay (HOSPITAL_COMMUNITY): Payer: Medicare Other | Admitting: Physical Therapy

## 2013-04-18 ENCOUNTER — Encounter (HOSPITAL_COMMUNITY): Payer: Medicare Other | Admitting: Occupational Therapy

## 2013-04-18 DIAGNOSIS — I10 Essential (primary) hypertension: Secondary | ICD-10-CM

## 2013-04-18 NOTE — Progress Notes (Signed)
Physical Therapy Session Note  Patient Details  Name: Ivan Clark MRN: [id number] Date of Birth: 1929-02-23  Today's Date: 04/18/2013 Time: 1345-1430 Time Calculation (min): 45 min  Short Term Goals: Week 1:  PT Short Term Goal 1 (Week 1): Pt will perform bed mobility on flat bed, no rail to L and R with min A and 25% cues for hip precautions PT Short Term Goal 2 (Week 1): Pt will perform stand pivot transfers bed <> chair with min A overall and 25% cues for hip precautions PT Short Term Goal 3 (Week 1): Pt will perform w/c mobility x 150' in controlled environment with min A  PT Short Term Goal 4 (Week 1): Pt will be able to sit upright in supported sitting and maintain head/trunk positioning without soft neck collar x 5 minutes  PT Short Term Goal 5 (Week 1): Pt will perform gait x 25' without AD and 100' with RW with min A and improved weight shifting, step and stride length and decreased shuffling  Skilled Therapeutic Interventions/Progress Updates:    Pt received seated in w/c in room with wife present; agreeable to PT. Session focused on improving pt utilization of balance recovery reactions and increasing pt independence with gait, stair negotiation, and sit<>stand transfers.  See below for detailed description of interventions addressing balance recovery strategies (focus on A/P balance perturbations). No cueing required during this session for pt  adherence to hip precautions.  Gait x80' in controlled environment with R HHA, min/mod A, and verbal cueing to increase pt awareness of excessive weight shift to R side during LLE swing phase of gait. Manual facilitation provided for normalized weight shift; pt required min verbal reinforcement during remainder of session. Negotiation of 8 stairs with bilat hand rails, step-to pattern requiring min A, min verbal cueing for sequencing with effective carryover. Due to pt/wife report of additional single step without hand rail to enter home,  pt negotiated single step/curb with R HHA with min A to ascend, mod A to descend. Gait x80' with R HHA to return to pt room, where pt performed sit<>stand transfers from w/c x7 reps total. During initial 2 transfers, pt required min A, verbal cueing, and demonstraion for setup/technique due to pt tendency to extend bilat knees prior to extending bilat hips. Pt performed remaining 5 transfers with close supervision to min guard. Therapist departed with pt seated in w/c with wife present and all needs within reach.  Therapy Documentation Precautions:  Precautions Precautions: Posterior Hip;Fall Precaution Comments: Reviewed posterior hip precautions.  Restrictions Weight Bearing Restrictions: No RLE Weight Bearing: Weight bearing as tolerated Pain: Pain Assessment Pain Assessment: No/denies pain Pain Score: 0-No pain Locomotion : Ambulation Ambulation/Gait Assistance: 3: Mod assist  Balance:   Therapist verbally explained/demonstrated use of hip and ankle strategy to bring COM anterior during posterior balance perturbation. With pt standing with back against wall, pt attempted to bring COM anterior using hip and ankle strategies. Pt noted to have effective hip strategy; however, ankle strategy initially delayed, limited by decreased bilat ankle dorsiflexion AROM. Stepping strategy not elicited/assessed. See FIM for current functional status  Therapy/Group: Individual Therapy  Hobble, Lorenda Ishihara 04/18/2013, 2:50 PM

## 2013-04-18 NOTE — Progress Notes (Signed)
Occupational Therapy Session Note  Patient Details  Name: Ivan Clark MRN: [id number] Date of Birth: Feb 05, 1929  Today's Date: 04/18/2013 Time: 4782-9562 Time Calculation (min): 25 min  Short Term Goals: Week 1:  OT Short Term Goal 1 (Week 1): STG=LTG  Skilled Therapeutic Interventions/Progress Updates:    Pt seen for 1:1 OT with focus on functional ambulation in ADL apt with and without RW.  Provided close supervision with ambulation with RW and min/steady assist without RW on carpeted surface.  Sit <> stand x4 throughout session from couch with supervision.  Pt reports having a walk-in shower at home, educated on sidestepping and stepping backwards over 3" lip of simulated walk-in shower.  Pt required min/steady assist with transfer both when sidestepping and stepping backwards.  Discussed with pt and wife shower equipment and suggested installing at least one grab bar on Rt side of shower.  Wife asked about shower chair with arm rests, followed up with SWK to recommend shower chair with arm rests.    Therapy Documentation Precautions:  Precautions Precautions: Posterior Hip;Fall Precaution Comments: Reviewed posterior hip precautions.  Restrictions Weight Bearing Restrictions: No RLE Weight Bearing: Weight bearing as tolerated Pain: Pain Assessment Pain Assessment: No/denies pain  See FIM for current functional status  Therapy/Group: Individual Therapy  Rosalio Loud 04/18/2013, 2:25 PM

## 2013-04-18 NOTE — Progress Notes (Signed)
Occupational Therapy Session Note  Patient Details  Name: [place] PECHACEK MRN: [id number] Date of Birth: 01-05-1929  Today's Date: 04/18/2013 Time: 7829-5621 Time Calculation (min): 53 min  Short Term Goals: Week 1:  OT Short Term Goal 1 (Week 1): STG=LTG  Skilled Therapeutic Interventions/Progress Updates:    Pt performed bathing and dressing at the sink during this session.  Pt stood during LB bathing and to pull pants over his hips with min assist.  He utilized AE for washing and drying his feet as well as doffing and donning his clothing.  Needed only min instructional cueing to utilize the reacher and mod instructional cueing for the sockaide.  Pt also able to state and follow 3/3 THR precautions with only one instructional cue during session.  Pt maintains cervical flexion during most of session during sitting and standing tasks.    Therapy Documentation Precautions:  Precautions Precautions: Posterior Hip;Fall Precaution Comments: Reviewed posterior hip precautions.  Restrictions Weight Bearing Restrictions: No RLE Weight Bearing: Weight bearing as tolerated  Pain: Pain Assessment Pain Assessment: No/denies pain ADL: See FIM for current functional status  Therapy/Group: Individual Therapy  Marquin Patino OTR/L 04/18/2013, 12:34 PM

## 2013-04-18 NOTE — Progress Notes (Signed)
Physical Therapy Session Note  Patient Details  Name: Ivan Clark MRN: [id number] Date of Birth: 08-06-1928  Today's Date: 04/18/2013 Time: 430 830 7841 Time Calculation (min): 59 min  Short Term Goals: Week 1:  PT Short Term Goal 1 (Week 1): Pt will perform bed mobility on flat bed, no rail to L and R with min A and 25% cues for hip precautions PT Short Term Goal 2 (Week 1): Pt will perform stand pivot transfers bed <> chair with min A overall and 25% cues for hip precautions PT Short Term Goal 3 (Week 1): Pt will perform w/c mobility x 150' in controlled environment with min A  PT Short Term Goal 4 (Week 1): Pt will be able to sit upright in supported sitting and maintain head/trunk positioning without soft neck collar x 5 minutes  PT Short Term Goal 5 (Week 1): Pt will perform gait x 25' without AD and 100' with RW with min A and improved weight shifting, step and stride length and decreased shuffling  Skilled Therapeutic Interventions/Progress Updates:    Pt received sitting in w/c; no c/o pain but does report hip being sore from therapy yesterday.  Assisted pt with donning of underwear and pants in sitting; pt performed sit > stand and maintained standing with one > no UE support to pull up pants with min A.  Performed gait x 100' in controlled environment with RW and min A with improved step and stride length bilaterally but as pt fatigued he presented with decreased weight shift to R and decreased stance time RLE with decreased step length LLE.  Pt required intermittent verbal and tactile cues to maintain upright gaze and head control.  Performed sit > supine with min-mod A to bring RLE onto mat and lower trunk to supine.  In supine performed PROM and AROM/strengthening for increased postural control of head and trunk: performed PROM of bilat pectoralis muscles and active strengthening of deep neck flexors with abdominal activation, scapular retraction and depression, R hip ER and bilat  glute activation for hip extension.  Performed sit > supine with rolling to L side with pillow between knees with mod A and side > sit with mod-max A to bring bilat LE off bed and lift trunk to upright position.  In sitting performed gentle transverse process mobilization at T1, T2 on R with L head rotation, UE flexion and inhale for increased thoracic mobility during head/neck rotation.  Returned to room with RW and placed ice on R hip seated in w/c.    Therapy Documentation Precautions:  Precautions Precautions: Posterior Hip;Fall Precaution Comments: Reviewed posterior hip precautions.  Restrictions Weight Bearing Restrictions: No RLE Weight Bearing: Weight bearing as tolerated General:   Vital Signs: Therapy Vitals Temp: 98.1 F (36.7 C) Temp src: Oral Pulse Rate: 81 BP: 130/65 mmHg Patient Position, if appropriate: Sitting Pain: Pain Assessment Pain Assessment: No/denies pain Pain Score: 0-No pain  See FIM for current functional status  Therapy/Group: Individual Therapy  [name] [facility] 04/18/2013, 3:36 PM

## 2013-04-18 NOTE — Progress Notes (Signed)
79 y.o. right-handed male with history of orthostatic hypotension as well as autonomic dysfunction from Parkinson's disease. Patient independent prior to admission at times using a walker. Admitted 04/09/2013 after a fall when he lost his balance landing on his right hip without loss of consciousness. X-rays and imaging revealed a displaced right femoral subcapital fracture. Underwent right hip hemiarthroplasty 04/09/2013 per Dr. [name]. Weightbearing as tolerated with posterior hip precautions. Postoperative pain management. Subcutaneous Lovenox for DVT prophylaxis. Acute blood loss anemia 10.3 and monitored. He remains on Sinemet for history of Parkinson's disease  Subjective/Complaints: Bladder wakes him up at noc.  Same problem at home no dysuria. Denies any significant hip pain.  Review of Systems - Negative except Weakness  Objective: Vital Signs: Blood pressure 171/69, pulse 82, temperature 98 F (36.7 C), temperature source Oral, resp. rate 18, SpO2 94.00%. No results found. No results found for this or any previous visit (from the past 72 hour(s)).   HEENT: normal Cardio: RRR and No murmurs Resp: CTA B/L and Unlabored GI: BS positive and Nondistended, nontender Extremity:  No Edema Skin:   Intact and Wound Right hip with staples Neuro: Alert/Oriented, Flat, Cranial Nerve II-XII normal, Normal Sensory and Abnormal Motor 4 minus right hip flexor knee extensor and 5/5 ankle dorsiflexor plantar flexor5/5 left hip flexor knee extensor ankle the flexor plantarflexion5/5 bilateral upper extremity Musc/Skel:  Swelling Left thigh, no erythema or tenderness General no acute distress   Assessment/Plan: 1. Functional deficits secondary to  right femoral neck fracture in a patient with Parkinson's disease after a fall 04/09/2013  which require 3+ hours per day of interdisciplinary therapy in a comprehensive inpatient rehab setting. Physiatrist is providing close team supervision and 24 hour  management of active medical problems listed below. Physiatrist and rehab team continue to assess barriers to discharge/monitor patient progress toward functional and medical goals. FIM: FIM - Bathing Bathing Steps Patient Completed: Chest;Right Arm;Left Arm;Abdomen;Front perineal area;Right upper leg;Left upper leg Bathing: 3: Mod-Patient completes 5-7 27f 10 parts or 50-74%  FIM - Upper Body Dressing/Undressing Upper body dressing/undressing steps patient completed: Pull shirt over trunk;Put head through opening of pull over shirt/dress;Thread/unthread left sleeve of pullover shirt/dress Upper body dressing/undressing: 5: Set-up assist to: Obtain clothing/put away FIM - Lower Body Dressing/Undressing Lower body dressing/undressing steps patient completed: Thread/unthread left underwear leg;Thread/unthread left pants leg;Pull pants up/down Lower body dressing/undressing: 0: Activity did not occur  FIM - Toileting Toileting steps completed by patient: Performs perineal hygiene Toileting Assistive Devices: Grab bar or rail for support Toileting: 0: Activity did not occur  FIM - Diplomatic Services operational officer Devices: Grab bars Toilet Transfers: 0-Activity did not occur  FIM - Banker Devices: Arm rests;Bed rails Bed/Chair Transfer: 3: Bed > Chair or W/C: Mod A (lift or lower assist);3: Supine > Sit: Mod A (lifting assist/Pt. 50-74%/lift 2 legs;3: Chair or W/C > Bed: Mod A (lift or lower assist)  FIM - Locomotion: Wheelchair Distance: 100 Locomotion: Wheelchair: 0: Activity did not occur FIM - Locomotion: Ambulation Ambulation/Gait Assistance: 3: Mod assist Locomotion: Ambulation: 0: Activity did not occur  Comprehension Comprehension Mode: Auditory Comprehension: 6-Follows complex conversation/direction: With extra time/assistive device  Expression Expression Mode: Verbal Expression: 6-Expresses complex ideas: With extra  time/assistive device  Social Interaction Social Interaction: 7-Interacts appropriately with others - No medications needed.  Problem Solving Problem Solving: 6-Solves complex problems: With extra time  Memory Memory: 6-More than reasonable amt of time  Medical Problem List and Plan:  1. Displaced right femoral neck fracture. Status post hemiarthroplasty 04/09/2013  2. DVT Prophylaxis/Anticoagulation: Subcutaneous Lovenox. Check vascular studies and  3. Pain Management: Hydrocodone and Robaxin as needed. Monitor with increased mobility  4. Neuropsych: This patient is capable of making decisions on his own behalf.  5. Acute blood loss anemia. Continue iron supplement followup CBC  6. History of orthostatic hypotension. Continue Florinef. Monitor closely with increased mobility  7. Parkinson's disease. Sinemet as directed as well as Eldepryl 5 mg twice a day.Follow up Dr. Terrace Arabia  8.BPH. Continue Avodart as well as Flomax. Need to watch blood pressures closely. Discuss home regimen with patient. Has chronic urinary freq 9. Hyperlipidemia. Lipitor   10. Dysphagia secondary to Parkinson's disease plus Botox injection   LOS (Days) 4 A FACE TO FACE EVALUATION WAS PERFORMED  Ivan Clark E 04/18/2013, 6:08 AM

## 2013-04-19 ENCOUNTER — Inpatient Hospital Stay (HOSPITAL_COMMUNITY): Payer: Medicare Other | Admitting: Physical Therapy

## 2013-04-19 ENCOUNTER — Inpatient Hospital Stay (HOSPITAL_COMMUNITY): Payer: Medicare Other | Admitting: Occupational Therapy

## 2013-04-19 ENCOUNTER — Inpatient Hospital Stay (HOSPITAL_COMMUNITY): Payer: Medicare Other | Admitting: Rehabilitation

## 2013-04-19 DIAGNOSIS — R1310 Dysphagia, unspecified: Secondary | ICD-10-CM

## 2013-04-19 DIAGNOSIS — I10 Essential (primary) hypertension: Secondary | ICD-10-CM

## 2013-04-19 DIAGNOSIS — S72009A Fracture of unspecified part of neck of unspecified femur, initial encounter for closed fracture: Secondary | ICD-10-CM

## 2013-04-19 DIAGNOSIS — G20 Parkinson's disease: Secondary | ICD-10-CM

## 2013-04-19 NOTE — Progress Notes (Signed)
Physical Therapy Session Note  Patient Details  Name: Ivan Clark MRN: [id number] Date of Birth: [birth year]  Today's Date: 04/19/2013 Time: 4098-1191 Time Calculation (min): 43 min  Short Term Goals: Week 1:  PT Short Term Goal 1 (Week 1): Pt will perform bed mobility on flat bed, no rail to L and R with min A and 25% cues for hip precautions PT Short Term Goal 2 (Week 1): Pt will perform stand pivot transfers bed <> chair with min A overall and 25% cues for hip precautions PT Short Term Goal 3 (Week 1): Pt will perform w/c mobility x 150' in controlled environment with min A  PT Short Term Goal 4 (Week 1): Pt will be able to sit upright in supported sitting and maintain head/trunk positioning without soft neck collar x 5 minutes  PT Short Term Goal 5 (Week 1): Pt will perform gait x 25' without AD and 100' with RW with min A and improved weight shifting, step and stride length and decreased shuffling  Skilled Therapeutic Interventions/Progress Updates:   Pt received sitting in w/c in room and agreeable to therapy this afternoon.  Ambulated approx 100' to/from gym with RW at min assist.  Provided tactile cues for upright posture, as well as verbal cues for posture and increased stride length.  Once in gym, questioned pt about hip precautions.  He was able to recall 2/3 with min cues for no internal rotation.  Provided pt with OTAGO HEP and performed each exercise x 10 reps (BLE LAQs, BLE standing hip abd, BLE standing knee flex, mini squats (with cues for abiding hip precautions), heel and toe raises BLE).  Educated to perform exercises standing at counter or sturdy table and to have chair behind him for safety.  Also educated on how to progress exercises with decreased UE support, however to have someone with him when attempting these.  Pt verbalized understanding.  Performed side stepping and backwards walking x 25' with min/mod assist (with therapist facing pt) with cues for increased step  length bilaterally and continued upright posture throughout.  Ended session with gentle stretching lying on wedge with feet propped up to stretch B pectoral muscles and to increased neck flexibility.  He was unable to let head touch single pillow, therefore placed another pillow and applied gentle stretch to forehead.  Pt tolerated well and noted mild improvement during gait back to room.  Pt left in wc in room with all needs in reach.   Therapy Documentation Precautions:  Precautions Precautions: Posterior Hip;Fall Precaution Booklet Issued: Yes (comment) Precaution Comments: Reviewed posterior hip precautions.  Restrictions Weight Bearing Restrictions: Yes RLE Weight Bearing: Weight bearing as tolerated   Pain: Pain Assessment Pain Assessment: No/denies pain (pt requesting ice pack at end of session)   Locomotion : Ambulation Ambulation/Gait Assistance: 4: Min assist   See FIM for current functional status  Therapy/Group: Individual Therapy  Vista Deck 04/19/2013, 3:40 PM

## 2013-04-19 NOTE — Progress Notes (Signed)
Occupational Therapy Session Note  Patient Details  Name: Ivan Clark MRN: [id number] Date of Birth: [birth year]  Today's Date: 04/19/2013 Time: 4098-1191 and 1330-1400 Time Calculation (min): 53 min and 30 min  Short Term Goals: Week 1:  OT Short Term Goal 1 (Week 1): STG=LTG  Skilled Therapeutic Interventions/Progress Updates:    1) Pt seen for ADL retraining with focus on functional transfers with RW and use of AE for LB bathing and dressing to adhere to hip precautions.  Transferred to walk in shower with RW and min assist with min cues for transfer technique.  Engaged in bathing at sit> stand level in walk-in shower with appropriate use of AE without any cues to incorporate.  Pt occasional steady assist during bathing.  Dressing completed at sit > stand level again with appropriate use of AE to complete LB dressing, required one instance of assist to adjust sock due to new socks.  Pt ambulated to sink and completed grooming in standing with supervision.  2) Pt seen for 1:1 OT with focus on functional mobility with RW.  Engaged in obstacle negotiation to simulate navigating small spaces in home environment and side stepping over obstacles to simulate stepping in to walk-in shower.  Pt required light min assist with all mobility, especially sidestepping.  Pt reports most difficulty upon d/c will be bed mobility and car transfer - wife to measure bed height to allow more accurate simulation.    Therapy Documentation Precautions:  Precautions Precautions: Posterior Hip;Fall Precaution Comments: Reviewed posterior hip precautions.  Restrictions Weight Bearing Restrictions: Yes RLE Weight Bearing: Weight bearing as tolerated General:   Vital Signs: Therapy Vitals Pulse Rate: 91 BP: 133/67 mmHg Patient Position, if appropriate: Standing Pain: Pain Assessment Pain Assessment: No/denies pain  See FIM for current functional status  Therapy/Group: Individual Therapy  Rosalio Loud 04/19/2013, 9:50 AM

## 2013-04-19 NOTE — Progress Notes (Signed)
79 y.o. right-handed male with history of orthostatic hypotension as well as autonomic dysfunction from Parkinson's disease. Patient independent prior to admission at times using a walker. Admitted 04/09/2013 after a fall when he lost his balance landing on his right hip without loss of consciousness. X-rays and imaging revealed a displaced right femoral subcapital fracture. Underwent right hip hemiarthroplasty 04/09/2013 per Dr. [name]. Weightbearing as tolerated with posterior hip precautions. Postoperative pain management. Subcutaneous Lovenox for DVT prophylaxis. Acute blood loss anemia 10.3 and monitored. He remains on Sinemet for history of Parkinson's disease  Subjective/Complaints: Therapy going ok per pt, no pain c/o.  Cont of bladder  Review of Systems - Negative except Weakness  Objective: Vital Signs: Blood pressure 176/68, pulse 77, temperature 99.2 F (37.3 C), temperature source Oral, resp. rate 18, SpO2 96.00%. No results found. No results found for this or any previous visit (from the past 72 hour(s)).   HEENT: normal Cardio: RRR and No murmurs Resp: CTA B/L and Unlabored GI: BS positive and Nondistended, nontender Extremity:  No Edema Skin:   Intact and Wound Right hip with staples, no drainage or erythema Neuro: Alert/Oriented, Flat, Cranial Nerve II-XII normal, Normal Sensory and Abnormal Motor 4 minus right hip flexor knee extensor and 5/5 ankle dorsiflexor plantar flexor5/5 left hip flexor knee extensor ankle the flexor plantarflexion5/5 bilateral upper extremity Musc/Skel:  Swelling Left thigh, no erythema or tenderness General no acute distress   Assessment/Plan: 1. Functional deficits secondary to  right femoral neck fracture in a patient with Parkinson's disease after a fall 04/09/2013  which require 3+ hours per day of interdisciplinary therapy in a comprehensive inpatient rehab setting. Physiatrist is providing close team supervision and 24 hour management of  active medical problems listed below. Physiatrist and rehab team continue to assess barriers to discharge/monitor patient progress toward functional and medical goals. FIM: FIM - Bathing Bathing Steps Patient Completed: Chest;Right Arm;Left Arm;Abdomen;Front perineal area;Right upper leg;Left upper leg;Buttocks;Right lower leg (including foot);Left lower leg (including foot) Bathing: 4: Steadying assist  FIM - Upper Body Dressing/Undressing Upper body dressing/undressing steps patient completed: Pull shirt over trunk;Put head through opening of pull over shirt/dress;Thread/unthread left sleeve of pullover shirt/dress Upper body dressing/undressing: 5: Set-up assist to: Obtain clothing/put away FIM - Lower Body Dressing/Undressing Lower body dressing/undressing steps patient completed: Thread/unthread left underwear leg;Thread/unthread left pants leg;Pull pants up/down;Pull underwear up/down;Thread/unthread right pants leg;Thread/unthread right underwear leg;Fasten/unfasten pants;Don/Doff right sock;Don/Doff left sock Lower body dressing/undressing: 4: Steadying Assist  FIM - Toileting Toileting steps completed by patient: Performs perineal hygiene Toileting Assistive Devices: Grab bar or rail for support Toileting: 0: Activity did not occur  FIM - Diplomatic Services operational officer Devices: Therapist, music Transfers: 0-Activity did not occur  FIM - Banker Devices: Environmental consultant;Arm rests Bed/Chair Transfer: 2: Supine > Sit: Max A (lifting assist/Pt. 25-49%);3: Sit > Supine: Mod A (lifting assist/Pt. 50-74%/lift 2 legs);4: Bed > Chair or W/C: Min A (steadying Pt. > 75%);4: Chair or W/C > Bed: Min A (steadying Pt. > 75%)  FIM - Locomotion: Wheelchair Distance: 100 Locomotion: Wheelchair: 0: Activity did not occur FIM - Locomotion: Ambulation Locomotion: Ambulation Assistive Devices: Designer, industrial/product Ambulation/Gait Assistance: 4: Min  assist Locomotion: Ambulation: 2: Travels 50 - 149 ft with minimal assistance (Pt.>75%)  Comprehension Comprehension Mode: Auditory Comprehension: 6-Follows complex conversation/direction: With extra time/assistive device  Expression Expression Mode: Verbal Expression: 6-Expresses complex ideas: With extra time/assistive device  Social Interaction Social Interaction: 7-Interacts appropriately with others -  No medications needed.  Problem Solving Problem Solving: 6-Solves complex problems: With extra time  Memory Memory: 6-More than reasonable amt of time  Medical Problem List and Plan:  1. Displaced right femoral neck fracture. Status post hemiarthroplasty 04/09/2013  2. DVT Prophylaxis/Anticoagulation: Subcutaneous Lovenox. Check vascular studies and  3. Pain Management: Hydrocodone and Robaxin as needed. Monitor with increased mobility  4. Neuropsych: This patient is capable of making decisions on his own behalf.  5. Acute blood loss anemia. Continue iron supplement followup CBC  6. History of orthostatic hypotension. Continue Florinef. Monitor closely with increased mobility  7. Parkinson's disease. Sinemet as directed as well as Eldepryl 5 mg twice a day.Follow up Dr. Terrace Arabia  8.BPH. Continue Avodart as well as Flomax. Need to watch blood pressures closely. Discuss home regimen with patient. Has chronic urinary freq 9. Hyperlipidemia. Lipitor   10. Dysphagia secondary to Parkinson's disease plus Botox injection  11.  Elevated sys BP will check orthostatics LOS (Days) 5 A FACE TO FACE EVALUATION WAS PERFORMED  Marialice Newkirk E 04/19/2013, 6:20 AM

## 2013-04-19 NOTE — Progress Notes (Signed)
Physical Therapy Session Note  Patient Details  Name: Ivan Clark MRN: [id number] Date of Birth: [birth year]  Today's Date: 04/19/2013 Time: 1100-1200 Time Calculation (min): 60 min  Short Term Goals: Week 1:  PT Short Term Goal 1 (Week 1): Pt will perform bed mobility on flat bed, no rail to L and R with min A and 25% cues for hip precautions PT Short Term Goal 2 (Week 1): Pt will perform stand pivot transfers bed <> chair with min A overall and 25% cues for hip precautions PT Short Term Goal 3 (Week 1): Pt will perform w/c mobility x 150' in controlled environment with min A  PT Short Term Goal 4 (Week 1): Pt will be able to sit upright in supported sitting and maintain head/trunk positioning without soft neck collar x 5 minutes  PT Short Term Goal 5 (Week 1): Pt will perform gait x 25' without AD and 100' with RW with min A and improved weight shifting, step and stride length and decreased shuffling  Skilled Therapeutic Interventions/Progress Updates:   Pt received sitting in w/c; educated pt on use and purpose of Kinesiotape to posterior neck and upper back to facilitate shortening of posterior neck muscles for improved head control and positioning.  Pt agreeable to taping.  Performed taping of neck extensors in sitting with +2 to assist with positioning during taping.  Performed transfers from w/c with min A and ambulation in controlled environment x 100' with min A and RW.  Performed sit > supine to R side with min A to bring RLE onto mat.  In supine on mat performed NMR for deep neck flexor and abdominal motor control training; see below for details.  Performed supine > sit training on elevated mat with focus on rolling sequencing; pt reported increased pressure on R hip during roll to R; performed half roll to R and assisted with controlled lowering of RLE off mat and mod A to position R elbow under pt and to bring trunk to upright position.  Required extra time at edge of mat with LE  elevated to allow BP to adjust (pt reporting dizziness upon arising).  Once asymptomatic performed ambulation with RW and verbal and tactile cues for cervical extension, thoracic extension and scapular retraction to maintain improved head positioning and upright gaze during ambulation x 100' back to room.  Ice pack requested for R hip at end of session.    Therapy Documentation Precautions:  Precautions Precautions: Posterior Hip;Fall Precaution Booklet Issued: Yes (comment) Precaution Comments: Reviewed posterior hip precautions.  Restrictions Weight Bearing Restrictions: Yes RLE Weight Bearing: Weight bearing as tolerated Pain: Pain Assessment Pain Assessment: No/denies pain (pt requesting ice pack at end of session) Locomotion : Ambulation Ambulation/Gait Assistance: 4: Min assist  Other Treatments: Treatments Neuromuscular Facilitation: Activity to increase motor control;Activity to increase timing and sequencing;Activity to increase sustained activation with focus on activation of deep neck flexors and scapular retraction and maintaining activation during dynamic UE and LE movements x 5-6 reps each exercise with verbal and tactile cues for sequencing of activation and minimizing use of SCM.    See FIM for current functional status  Therapy/Group: Individual Therapy  [name] [facility] 04/19/2013, 12:16 PM

## 2013-04-20 ENCOUNTER — Inpatient Hospital Stay (HOSPITAL_COMMUNITY): Payer: Medicare Other | Admitting: Occupational Therapy

## 2013-04-20 ENCOUNTER — Inpatient Hospital Stay (HOSPITAL_COMMUNITY): Payer: Medicare Other

## 2013-04-20 NOTE — Progress Notes (Signed)
79 y.o. right-handed male with history of orthostatic hypotension as well as autonomic dysfunction from Parkinson's disease. Patient independent prior to admission at times using a walker. Admitted 04/09/2013 after a fall when he lost his balance landing on his right hip without loss of consciousness. X-rays and imaging revealed a displaced right femoral subcapital fracture. Underwent right hip hemiarthroplasty 04/09/2013 per Dr. [name]. Weightbearing as tolerated with posterior hip precautions. Postoperative pain management. Subcutaneous Lovenox for DVT prophylaxis. Acute blood loss anemia 10.3 and monitored. He remains on Sinemet for history of Parkinson's disease  Subjective/Complaints: No dizziness with standing Bladder freq at noc not inc per pt  Review of Systems - Negative except Weakness  Objective: Vital Signs: Blood pressure 181/72, pulse 75, temperature 97.7 F (36.5 C), temperature source Oral, resp. rate 18, SpO2 99.00%. No results found. No results found for this or any previous visit (from the past 72 hour(s)).   HEENT: normal Cardio: RRR and No murmurs Resp: CTA B/L and Unlabored GI: BS positive and Nondistended, nontender Extremity:  No Edema Skin:   Intact and Wound Right hip with staples, no drainage or erythema Neuro: Alert/Oriented, Flat, Cranial Nerve II-XII normal, Normal Sensory and Abnormal Motor 4 minus right hip flexor knee extensor and 5/5 ankle dorsiflexor plantar flexor5/5 left hip flexor knee extensor ankle the flexor plantarflexion5/5 bilateral upper extremity Musc/Skel:  Swelling Left thigh, no erythema or tenderness General no acute distress   Assessment/Plan: 1. Functional deficits secondary to  right femoral neck fracture in a patient with Parkinson's disease after a fall 04/09/2013  which require 3+ hours per day of interdisciplinary therapy in a comprehensive inpatient rehab setting. Physiatrist is providing close team supervision and 24 hour  management of active medical problems listed below. Physiatrist and rehab team continue to assess barriers to discharge/monitor patient progress toward functional and medical goals. Team conference today please see physician documentation under team conference tab, met with team face-to-face to discuss problems,progress, and goals. Formulized individual treatment plan based on medical history, underlying problem and comorbidities. FIM: FIM - Bathing Bathing Steps Patient Completed: Chest;Right Arm;Left Arm;Abdomen;Front perineal area;Right upper leg;Left upper leg;Buttocks;Right lower leg (including foot);Left lower leg (including foot) Bathing: 5: Set-up assist to: Obtain items  FIM - Upper Body Dressing/Undressing Upper body dressing/undressing steps patient completed: Pull shirt over trunk;Put head through opening of pull over shirt/dress;Thread/unthread left sleeve of pullover shirt/dress;Thread/unthread right sleeve of pullover shirt/dresss Upper body dressing/undressing: 5: Set-up assist to: Obtain clothing/put away FIM - Lower Body Dressing/Undressing Lower body dressing/undressing steps patient completed: Thread/unthread left underwear leg;Thread/unthread left pants leg;Pull pants up/down;Pull underwear up/down;Thread/unthread right pants leg;Thread/unthread right underwear leg;Fasten/unfasten pants;Don/Doff right sock;Don/Doff left sock Lower body dressing/undressing: 5: Set-up assist to: Don/Doff TED stocking  FIM - Toileting Toileting steps completed by patient: Adjust clothing prior to toileting;Performs perineal hygiene;Adjust clothing after toileting Toileting Assistive Devices: Grab bar or rail for support Toileting: 4: Steadying assist  FIM - Diplomatic Services operational officer Devices: Grab bars;Walker Toilet Transfers: 5-To toilet/BSC: Supervision (verbal cues/safety issues);5-From toilet/BSC: Supervision (verbal cues/safety issues)  FIM - Physiological scientist Devices: Walker;Arm rests Bed/Chair Transfer: 3: Supine > Sit: Mod A (lifting assist/Pt. 50-74%/lift 2 legs;4: Sit > Supine: Min A (steadying pt. > 75%/lift 1 leg);4: Bed > Chair or W/C: Min A (steadying Pt. > 75%);4: Chair or W/C > Bed: Min A (steadying Pt. > 75%)  FIM - Locomotion: Wheelchair Distance: 100 Locomotion: Wheelchair: 0: Activity did not occur FIM - Locomotion: Ambulation Locomotion:  Ambulation Assistive Devices: Designer, industrial/product Ambulation/Gait Assistance: 4: Min assist Locomotion: Ambulation: 2: Travels 50 - 149 ft with minimal assistance (Pt.>75%)  Comprehension Comprehension Mode: Auditory Comprehension: 6-Follows complex conversation/direction: With extra time/assistive device  Expression Expression Mode: Verbal Expression: 6-Expresses complex ideas: With extra time/assistive device  Social Interaction Social Interaction: 7-Interacts appropriately with others - No medications needed.  Problem Solving Problem Solving: 6-Solves complex problems: With extra time  Memory Memory: 6-More than reasonable amt of time  Medical Problem List and Plan:  1. Displaced right femoral neck fracture. Status post hemiarthroplasty 04/09/2013  2. DVT Prophylaxis/Anticoagulation: Subcutaneous Lovenox. Check vascular studies and  3. Pain Management: Hydrocodone and Robaxin as needed. Monitor with increased mobility  4. Neuropsych: This patient is capable of making decisions on his own behalf.  5. Acute blood loss anemia. Continue iron supplement followup CBC  6. History of orthostatic hypotension. Continue Florinef. Monitor closely with increased mobility  7. Parkinson's disease. Sinemet as directed as well as Eldepryl 5 mg twice a day.Follow up Dr. Terrace Arabia  8.BPH. Continue Avodart as well as Flomax. Need to watch blood pressures closely. Discuss home regimen with patient. Has chronic urinary freq 9. Hyperlipidemia. Lipitor   10. Dysphagia  secondary to Parkinson's disease plus Botox injection  11.  Elevated sys BP positive orthostatics, asymptomatic, will adjust meds to keep normotensive in standing position LOS (Days) 6 A FACE TO FACE EVALUATION WAS PERFORMED  Baden Betsch E 04/20/2013, 7:01 AM

## 2013-04-20 NOTE — Patient Care Conference (Signed)
Inpatient RehabilitationTeam Conference and Plan of Care Update Date: 04/20/2013   Time: 11:30 AM    Patient Name: Ivan Clark      Medical Record Number: [id number]  Date of Birth: 1929-04-28 Sex: Male         Room/Bed: 4M01C/4M01C-01 Payor Info: Payor: [employer] [place] [employer] / Plan: BLUE MEDICARE / Product Type: *No Product type* /    Admitting Diagnosis: FEMORAL FX/PARKINSONS DZ  Admit Date/Time:  04/14/2013  3:38 PM Admission Comments: No comment available   Primary Diagnosis:  Fracture of femoral neck, right Principal Problem: Fracture of femoral neck, right  Patient Active Problem List   Diagnosis Date Noted  . Fracture of femoral neck, right 04/14/2013  . Right femoral fracture 04/09/2013  . Hypertension 04/09/2013  . Hyperlipidemia 04/09/2013  . BPH (benign prostatic hypertrophy) 04/09/2013  . Closed right hip fracture 04/09/2013  . Hip fracture, right 04/09/2013  . Femoral neck fracture 04/09/2013  . Dysphagia, pharyngeal 03/02/2013  . Cervical dystonia 07/28/2012  . Sialorrhea 07/28/2012  . Personal history of malignant neoplasm of bladder 07/27/2012  . Unspecified essential hypertension 07/27/2012  . Dizziness and giddiness 07/27/2012  . Parkinsonism 07/27/2012    Expected Discharge Date: Expected Discharge Date: 04/23/13  Team Members Present: Physician leading conference: Dr. Claudette Laws Social Worker Present: Dossie Der, LCSW Nurse Present: Gregor Hams, RN PT Present: Wanda Plump, PT OT Present: Scherrie November, Felipa Eth, OT SLP Present: Maxcine Ham, SLP PPS Coordinator present : Tora Duck, RN, CRRN     Current Status/Progress Goal Weekly Team Focus  Medical   hip pain controlled bladder freq at night , no bowel issues, orthostatic Hypotension asymptomatic  return to home with wife assit  increase activity tolerance   Bowel/Bladder   Pt is continent of bowel and bladder.  Uses urinal.  On Senokot for  BM.   remain continent of B/B  Continue medications for decreased risk of constipation.    Swallow/Nutrition/ Hydration     regular diet        ADL's   min assist transfers, min-supervision bathing, min-supervision dressing  supervision overall  postural control, balance, family education   Mobility   Min-mod A overall   Supervision overall except min A bed mobility and stairs  Postural control and head control/strengthening, balance, gait, LE ROM/strength   Communication   Able to make all needs known appropriately.  Alert and oriented.   remain alert and oriented.   cognition checks q shift.    Safety/Cognition/ Behavioral Observations  No safety issues at this time.   remain safe   remind patient to call for assistance with transfers.    Pain   no pain reported  pain will remain less than 3 on 1/10 scale  assess pain q shift and PRN   Skin   Right hip incision with staples intact  remain free of infection and breakdown  assess q shift and PRN       *See Care Plan and progress notes for long and short-term goals.  Barriers to Discharge: has parkinson's in addition to Hip fx    Possible Resolutions to Barriers:  cont rehab, caregiver training    Discharge Planning/Teaching Needs:  Home with wife who can provide minimal physical care.  Here daily participating in pt's care      Team Discussion:  Pain controlled, Blood pressure issues MD addressing.  Making good progress, need to do family education with wife-actual car and sit/stand.  Revisions to Treatment Plan:  No issues   Continued Need for Acute Rehabilitation Level of Care: The patient requires daily medical management by a physician with specialized training in physical medicine and rehabilitation for the following conditions: Daily direction of a multidisciplinary physical rehabilitation program to ensure safe treatment while eliciting the highest outcome that is of practical value to the patient.: Yes Daily medical  management of patient stability for increased activity during participation in an intensive rehabilitation regime.: Yes Daily analysis of laboratory values and/or radiology reports with any subsequent need for medication adjustment of medical intervention for : Neurological problems;Post surgical problems  Natahlia Hoggard, Lemar Livings 04/20/2013, 1:05 PM

## 2013-04-20 NOTE — Progress Notes (Signed)
Patient has slept quietly and without incident this shift.    Ivan Clark M

## 2013-04-20 NOTE — Progress Notes (Signed)
Physical Therapy Note  Patient Details  Name: Ivan Clark MRN: [id number] Date of Birth: 02/01/1929 Today's Date: 04/20/2013  10:00 - 10:52 52 minutes Individual session. Patient denies pain.  Patient sitting in wheelchair upon entering room. Patient sit to stand and ambulated with rolling walker 180 feet with close supervision. Patient performed bilateral LE exercises including LAQ's in sitting; heel slides, hip abduction/adduction, SAQ's, towel squeezes, glut sets, ankle pumps, and hip external rotation x 12 in supine. Patient ambulated around and stepped over obstacles with rolling walker and close supervision. Patient ambulated up and down 10 steps with bilateral rails and close supervision. Patient remembered sequencing for steps without assist. Patient performed supine <> sit on regular double bed with supervision. Patient performed car transfer with min assist for right LE entering car. Patient ambulated 180 feet with rolling walker and close supervision back to room and left in wheelchair with all items in reach.   Arelia Longest M 04/20/2013, 11:28 AM

## 2013-04-20 NOTE — Progress Notes (Signed)
Occupational Therapy Session Note  Patient Details  Name: Ivan Clark MRN: [id number] Date of Birth: Jul 01, 1928  Today's Date: 04/20/2013 Time: [phone] and [phone] Time Calculation (min): 55 min and 45 min  Short Term Goals: Week 1:  OT Short Term Goal 1 (Week 1): STG=LTG  Skilled Therapeutic Interventions/Progress Updates:    1) Pt seen for ADL retraining with focus on functional transfers with RW and use of AE for LB bathing and dressing to adhere to hip precautions.  Pt on toilet upon arrival, completed hygiene and clothing management without physical assist.  Pt gathered clothing from dresser with sidestepping to access dresser with supervision with RW.  Pt completed walk-in shower transfer with supervision, and completed bathing at supervision level with use of long handled sponge without requiring any verbal cues for hip precautions and use of AE.  Pt completed dressing with use of AE, requiring assist to don shoes as he was unable to manipulate long handled shoe horn to fix back of shoes.  Pt reports wife would occasionally assist with donning shoes and would tie them.  Pt completed grooming in standing with distant supervision.  2) Pt seen for 1:1 OT with focus on functional mobility with RW in home environment.  Pt's wife present and discussion regarding ongoing bathroom modifications occurred with recommendation for BSC to place over toilet as pt requires grab bar or arm rests to push up from.  Engaged in simulated walk-in shower transfer with 3" ledge with pt sidestepping and stepping backwards in to simulated shower.  Pt demonstrated increased safety with transition with backward stepping and pt and pt's wife report increased confidence with this method.  Discussed bed mobility with 25" bed to floor height and plan to attempt during next session.  Discussed real car transfer with wife and plan to arrange for Friday in preparation for d/c home.   Therapy  Documentation Precautions:  Precautions Precautions: Posterior Hip;Fall Precaution Booklet Issued: Yes (comment) Precaution Comments: Reviewed posterior hip precautions.  Restrictions Weight Bearing Restrictions: Yes RLE Weight Bearing: Weight bearing as tolerated Pain: Pain Assessment Pain Assessment: 0-10 Pain Score: 0-No pain Patients Stated Pain Goal: 3 Multiple Pain Sites: No  See FIM for current functional status  Therapy/Group: Individual Therapy  Rosalio Loud 04/20/2013, 11:19 AM

## 2013-04-20 NOTE — Progress Notes (Signed)
Physical Therapy Note  Patient Details  Name: Ivan Clark MRN: [id number] Date of Birth: [birth year] Today's Date: 04/20/2013  1:00 - 1:30 30 minutes Individual session Patient denies pain.  Treatment focused on ambulation independence and endurance. Patient ambulated 180 - 200 feet x 3 on tile and carpeted surfaces to simulate home environment. Patient supervision for all ambulation. Patient practiced sit to stand from varying furniture surfaces. Patient supervision for all except min assist from kitchen straight back chair without arms. Patient reports that he can use a chair with arms at home at the kitchen table. Patient left in wheelchair in room with all items in reach.  Arelia Longest M 04/20/2013, 2:51 PM

## 2013-04-20 NOTE — Progress Notes (Signed)
Social Work Patient ID: Ivan Clark, male   DOB: 31-Mar-1929, 79 y.o.   MRN: [id number] Met with wife when here to inform of team conference goals and discharge date.  Have scheduled family education for Friday at 11;00 to do actual car transfer and sit/stand. Both pleased with how well he has done and plan to go on outing tomorrow.  Will work toward discharge needs.

## 2013-04-21 ENCOUNTER — Encounter (HOSPITAL_COMMUNITY): Payer: Medicare Other | Admitting: *Deleted

## 2013-04-21 ENCOUNTER — Inpatient Hospital Stay (HOSPITAL_COMMUNITY): Payer: Medicare Other | Admitting: Occupational Therapy

## 2013-04-21 ENCOUNTER — Inpatient Hospital Stay (HOSPITAL_COMMUNITY): Payer: Medicare Other | Admitting: Physical Therapy

## 2013-04-21 DIAGNOSIS — R1310 Dysphagia, unspecified: Secondary | ICD-10-CM

## 2013-04-21 DIAGNOSIS — S72009A Fracture of unspecified part of neck of unspecified femur, initial encounter for closed fracture: Secondary | ICD-10-CM

## 2013-04-21 DIAGNOSIS — I10 Essential (primary) hypertension: Secondary | ICD-10-CM

## 2013-04-21 DIAGNOSIS — G20 Parkinson's disease: Secondary | ICD-10-CM

## 2013-04-21 LAB — CREATININE, SERUM
Creatinine, Ser: 0.9 mg/dL (ref 0.50–1.35)
GFR calc non Af Amer: 77 mL/min — ABNORMAL LOW (ref >90)

## 2013-04-21 MED ORDER — ENSURE COMPLETE PO LIQD
237.0000 mL | ORAL | Status: DC
  Administered 2013-04-22: 237 mL via ORAL

## 2013-04-21 NOTE — Progress Notes (Signed)
Physical Therapy Session Note  Patient Details  Name: Ivan Clark MRN: [id number] Date of Birth: Jun 09, 1928  Today's Date: 04/21/2013 Time: 1230-1430 Time Calculation (min): 120 min  Skilled Therapeutic Interventions/Progress Updates:    Pt participated in community lunch outing to [employer] with focus on community mobility with RW, energy conservation techniques, problem solving through various scenarios in the community, navigating [place] steps and curb step with RW, and education with pt's wife. Pt overall S with mobility, min A for stair negotiation.  Therapy Documentation Precautions:  Precautions Precautions: Posterior Hip;Fall Precaution Comments: Reviewed posterior hip precautions.  RLE Weight Bearing: Weight bearing as tolerated  See FIM for current functional status  Therapy/Group: Community Reintegration with Recreational Therapy  Karolee Stamps [facility] 04/21/2013, 3:00 PM

## 2013-04-21 NOTE — Progress Notes (Signed)
Occupational Therapy Session Note  Patient Details  Name: GARRITT MOLYNEUX MRN: [id number] Date of Birth: 07-Feb-1929  Today's Date: 04/21/2013 Time: 1018-1100 Time Calculation (min): 42 min  Short Term Goals: Week 1:  OT Short Term Goal 1 (Week 1): STG=LTG  Skilled Therapeutic Interventions/Progress Updates:    Pt seen for ADL retraining with focus on functional transfers with RW and use of AE for LB bathing and dressing to adhere to hip precautions.  Pt gathered clothing from dresser with sidestepping to access dresser with supervision with RW. Pt completed walk-in shower transfer with supervision, and completed bathing at supervision level with use of long handled sponge without requiring any verbal cues for hip precautions and appropriate use of AE. Pt completed dressing with use of AE, requiring assist to don and tie shoes. Pt completed grooming in standing with distant supervision.  Therapy Documentation Precautions:  Precautions Precautions: Posterior Hip;Fall Precaution Booklet Issued: Yes (comment) Precaution Comments: Reviewed posterior hip precautions.  Restrictions Weight Bearing Restrictions: Yes RLE Weight Bearing: Weight bearing as tolerated Pain:  Pt with no c/o pain this session.  See FIM for current functional status  Therapy/Group: Individual Therapy  Rosalio Loud 04/21/2013, 12:23 PM

## 2013-04-21 NOTE — Progress Notes (Signed)
NUTRITION FOLLOW UP  Intervention:   1. Supplements; decrease Ensure Complete to once daily (350 kcals, 13 gm protein per 8 fl oz bottle.  2. General healthful diet; encourage intake of foods and beverages as able. RD to follow and assess for nutritional adequacy.   NUTRITION DIAGNOSIS:  Increased nutrient needs related to healing, increased energy expenditure as evidenced by hip fx, rehab participant   Goal:  Pt to meet >/= 90% of their estimated nutrition needs, met.  Monitor:  PO & supplemental intake, weight, labs, I/O's  Assessment:   Patient with PMH of HTN, hyperlipidemia and Parkinson's disease; presented after falling in his kitchen landing on his R hip; X-ray showed a displaced right femoral subcapital fracture.   Patient s/p procedure 11/29:  RIGHT HIP HEMIARTHROPLASTY   Pt's intake has remained adequate with 75-100% meal completion consistently.  HE states he is drinking Ensure 1-2 times daily which is appropriate given improved intake. Pt believes his intake has improved since transition to Regular diet citing "food tastes better." His dysphagia r/t botox injection has resolved per his report.  He is excited about his lunch outing today.   Noted patient is on MAOI selegiline (ELDEPRYL). Current hospital diet is low in [place]. Please consult RD for education as/if needed.   Height: Ht Readings from Last 1 Encounters:  04/09/13 5\' 6"  (1.676 m)    Weight Status:   Wt Readings from Last 1 Encounters:  04/20/13 171 lb 1.6 oz (77.61 kg)    Re-estimated needs:  Kcal: 1700-1900 Protein: 80-90g Fluid: ~2.0 L/day  Skin:  Surgical incision to hip  Diet Order: General   Intake/Output Summary (Last 24 hours) at 04/21/13 1150 Last data filed at 04/21/13 0750  Gross per 24 hour  Intake   1080 ml  Output   1450 ml  Net   -370 ml    Last BM: 12/11  Labs:   Recent Labs Lab 04/14/13 1730 04/15/13 0520 04/21/13 0505  NA  --  137  --   K  --  3.5  --   CL   --  101  --   CO2  --  27  --   BUN  --  20  --   CREATININE 0.84 0.73 0.90  CALCIUM  --  8.5  --   GLUCOSE  --  107*  --     CBG (last 3)  No results found for this basename: GLUCAP,  in the last 72 hours  Scheduled Meds: . amLODipine  10 mg Oral Daily  . aspirin EC  81 mg Oral QPC supper  . atorvastatin  20 mg Oral Daily  . B-complex with vitamin C  1 tablet Oral Daily  . carbidopa-levodopa  1 tablet Oral QHS  . carbidopa-levodopa  1 tablet Oral TID WC  . cholecalciferol  2,000 Units Oral Daily  . dutasteride  0.5 mg Oral QPC supper  . enoxaparin (LOVENOX) injection  40 mg Subcutaneous Q24H  . feeding supplement (ENSURE COMPLETE)  237 mL Oral BID BM  . ferrous sulfate  325 mg Oral TID PC  . fludrocortisone  0.1 mg Oral Daily  . irbesartan  150 mg Oral BID  . multivitamin with minerals  1 tablet Oral Daily  . pantoprazole  40 mg Oral Q0600  . polyethylene glycol  17 g Oral BID  . selegiline  5 mg Oral BID WC  . senna-docusate  2 tablet Oral BID  . tamsulosin  0.4 mg Oral QPC supper  Continuous Infusions:   Brynda Greathouse, MS RD LDN Clinical Inpatient Dietitian Pager: [phone] Weekend/After hours pager: [phone]

## 2013-04-21 NOTE — Progress Notes (Signed)
Physical Therapy Session Note  Patient Details  Name: Ivan Clark MRN: [id number] Date of Birth: [birth year]  Today's Date: 04/21/2013 Time: 3086-5784 Time Calculation (min): 45 min  Short Term Goals: Week 1:  PT Short Term Goal 1 (Week 1): Pt will perform bed mobility on flat bed, no rail to L and R with min A and 25% cues for hip precautions PT Short Term Goal 1 - Progress (Week 1): Met PT Short Term Goal 2 (Week 1): Pt will perform stand pivot transfers bed <> chair with min A overall and 25% cues for hip precautions PT Short Term Goal 2 - Progress (Week 1): Met PT Short Term Goal 3 (Week 1): Pt will perform w/c mobility x 150' in controlled environment with min A  PT Short Term Goal 3 - Progress (Week 1): Met PT Short Term Goal 4 (Week 1): Pt will be able to sit upright in supported sitting and maintain head/trunk positioning without soft neck collar x 5 minutes  PT Short Term Goal 4 - Progress (Week 1): Partly met PT Short Term Goal 5 (Week 1): Pt will perform gait x 25' without AD and 100' with RW with min A and improved weight shifting, step and stride length and decreased shuffling PT Short Term Goal 5 - Progress (Week 1): Met  Skilled Therapeutic Interventions/Progress Updates:   Pt reporting that he is excited about lunch outing later today and about D/C date this weekend.  He states he feels ready.  Performed sit > stand and ambulation in controlled environment x 150' with RW supervision.  Began with NMR for head and postural control, see below for details.  Also reviewed standing RLE HEP with bilat UE support on RW for balance during 10 reps each: hip flexion to 90, hip ABD, hip extension and HS curls with verbal and tactile cues for more upright posture during exercise.  Performed gait back to room without AD x 100' with min A overall with intermittent verbal and tactile cues for thoracic extension and cervical extension to maintain upright gaze during gait.  Pt with one  episode of lateral LOB around obstacles and required min A to correct.  Ice pack placed on R hip at end of session for pain management.   Therapy Documentation Precautions:  Precautions Precautions: Posterior Hip;Fall Precaution Booklet Issued: Yes (comment) Precaution Comments: Reviewed posterior hip precautions.  Restrictions Weight Bearing Restrictions: Yes RLE Weight Bearing: Weight bearing as tolerated Pain:  No reports of pain but did request ice pack at end of session  Other Treatments: Treatments Neuromuscular Facilitation: Activity to increase motor control;Activity to increase timing and sequencing;Activity to increase sustained activation beginning in reclined position on wedge with towel roll along spine for increased extension/elongation and chest expansion while performing core activation and deep neck flexor training.  Transitioned to sitting in chair with back supported and towel roll along spine and performing core and deep neck flexor activation during cervical rotation and bilat UE dynamic movements.  Pt tolerated well with no reports of neck or back pain.    See FIM for current functional status  Therapy/Group: Individual Therapy  [name] [facility] 04/21/2013, 12:09 PM

## 2013-04-21 NOTE — Progress Notes (Signed)
79 y.o. right-handed male with history of orthostatic hypotension as well as autonomic dysfunction from Parkinson's disease. Patient independent prior to admission at times using a walker. Admitted 04/09/2013 after a fall when he lost his balance landing on his right hip without loss of consciousness. X-rays and imaging revealed a displaced right femoral subcapital fracture. Underwent right hip hemiarthroplasty 04/09/2013 per Dr. [name]. Weightbearing as tolerated with posterior hip precautions. Postoperative pain management. Subcutaneous Lovenox for DVT prophylaxis. Acute blood loss anemia 10.3 and monitored. He remains on Sinemet for history of Parkinson's disease  Subjective/Complaints: Excited about outing Pleased re D/C date  Review of Systems - Negative except Weakness  Objective: Vital Signs: Blood pressure 175/63, pulse 83, temperature 98.1 F (36.7 C), temperature source Oral, resp. rate 18, weight 77.61 kg (171 lb 1.6 oz), SpO2 96.00%. No results found. Results for orders placed during the hospital encounter of 04/14/13 (from the past 72 hour(s))  CREATININE, SERUM     Status: Abnormal   Collection Time    04/21/13  5:05 AM      Result Value Range   Creatinine, Ser 0.90  0.50 - 1.35 mg/dL   GFR calc non Af Amer 77 (*) >90 mL/min   GFR calc Af Amer 89 (*) >90 mL/min   Comment: (NOTE)     The eGFR has been calculated using the CKD EPI equation.     This calculation has not been validated in all clinical situations.     eGFR's persistently <90 mL/min signify possible Chronic Kidney     Disease.     HEENT: normal Cardio: RRR and No murmurs Resp: CTA B/L and Unlabored GI: BS positive and Nondistended, nontender Extremity:  No Edema Skin:   Intact and Wound Right hip with staples, no drainage or erythema Neuro: Alert/Oriented, Flat, Cranial Nerve II-XII normal, Normal Sensory and Abnormal Motor 4 minus right hip flexor knee extensor and 5/5 ankle dorsiflexor plantar flexor5/5  left hip flexor knee extensor ankle the flexor plantarflexion5/5 bilateral upper extremity Musc/Skel:  Swelling Left thigh, no erythema or tenderness General no acute distress   Assessment/Plan: 1. Functional deficits secondary to  right femoral neck fracture in a patient with Parkinson's disease after a fall 04/09/2013  which require 3+ hours per day of interdisciplinary therapy in a comprehensive inpatient rehab setting. Physiatrist is providing close team supervision and 24 hour management of active medical problems listed below. Physiatrist and rehab team continue to assess barriers to discharge/monitor patient progress toward functional and medical goals.  FIM: FIM - Bathing Bathing Steps Patient Completed: Chest;Right Arm;Left Arm;Abdomen;Front perineal area;Right upper leg;Left upper leg;Buttocks;Right lower leg (including foot);Left lower leg (including foot) Bathing: 5: Set-up assist to: Obtain items  FIM - Upper Body Dressing/Undressing Upper body dressing/undressing steps patient completed: Pull shirt over trunk;Put head through opening of pull over shirt/dress;Thread/unthread left sleeve of pullover shirt/dress;Thread/unthread right sleeve of pullover shirt/dresss Upper body dressing/undressing: 5: Supervision: Safety issues/verbal cues FIM - Lower Body Dressing/Undressing Lower body dressing/undressing steps patient completed: Thread/unthread left underwear leg;Thread/unthread left pants leg;Pull pants up/down;Pull underwear up/down;Thread/unthread right pants leg;Thread/unthread right underwear leg;Fasten/unfasten pants;Don/Doff right sock;Don/Doff left sock;Don/Doff right shoe;Don/Doff left shoe Lower body dressing/undressing: 4: Min-Patient completed 75 plus % of tasks  FIM - Toileting Toileting steps completed by patient: Adjust clothing prior to toileting;Performs perineal hygiene;Adjust clothing after toileting Toileting Assistive Devices: Grab bar or rail for  support Toileting: 5: Supervision: Safety issues/verbal cues  FIM - Diplomatic Services operational officer Devices: Psychiatrist Transfers:  2-To toilet/BSC: Max A (lift and lower assist);2-From toilet/BSC: Max A (lift and lower assist)  FIM - Banker Devices: Walker;Arm rests Bed/Chair Transfer: 5: Supine > Sit: Supervision (verbal cues/safety issues);5: Sit > Supine: Supervision (verbal cues/safety issues);5: Bed > Chair or W/C: Supervision (verbal cues/safety issues);5: Chair or W/C > Bed: Supervision (verbal cues/safety issues)  FIM - Locomotion: Wheelchair Distance: 100 Locomotion: Wheelchair: 0: Activity did not occur FIM - Locomotion: Ambulation Locomotion: Ambulation Assistive Devices: Designer, industrial/product Ambulation/Gait Assistance: 5: Supervision Locomotion: Ambulation: 0: Activity did not occur  Comprehension Comprehension Mode: Auditory Comprehension: 6-Follows complex conversation/direction: With extra time/assistive device  Expression Expression Mode: Verbal Expression: 6-Expresses complex ideas: With extra time/assistive device  Social Interaction Social Interaction: 7-Interacts appropriately with others - No medications needed.  Problem Solving Problem Solving: 6-Solves complex problems: With extra time  Memory Memory: 6-More than reasonable amt of time  Medical Problem List and Plan:  1. Displaced right femoral neck fracture. Status post hemiarthroplasty 04/09/2013, plan staple removal prior to D/C  2. DVT Prophylaxis/Anticoagulation: Subcutaneous Lovenox. Check vascular studies and  3. Pain Management: Hydrocodone and Robaxin as needed. Monitor with increased mobility  4. Neuropsych: This patient is capable of making decisions on his own behalf.  5. Acute blood loss anemia. Continue iron supplement followup CBC  6. History of orthostatic hypotension. Continue Florinef. Monitor closely with increased mobility   7. Parkinson's disease. Sinemet as directed as well as Eldepryl 5 mg twice a day.Follow up Dr. Terrace Arabia  8.BPH. Continue Avodart as well as Flomax. Need to watch blood pressures closely. Discuss home regimen with patient. Has chronic urinary freq 9. Hyperlipidemia. Lipitor   10. Dysphagia secondary to Parkinson's disease plus Botox injection improved 11.  Elevated sys BP positive orthostatics, asymptomatic, will adjust meds to keep normotensive in standing position LOS (Days) 7 A FACE TO FACE EVALUATION WAS PERFORMED  Pawel Soules E 04/21/2013, 6:33 AM

## 2013-04-22 ENCOUNTER — Inpatient Hospital Stay (HOSPITAL_COMMUNITY): Payer: Medicare Other | Admitting: Occupational Therapy

## 2013-04-22 ENCOUNTER — Inpatient Hospital Stay (HOSPITAL_COMMUNITY): Payer: Medicare Other | Admitting: *Deleted

## 2013-04-22 ENCOUNTER — Inpatient Hospital Stay (HOSPITAL_COMMUNITY): Payer: Medicare Other | Admitting: Physical Therapy

## 2013-04-22 MED ORDER — ATORVASTATIN CALCIUM 20 MG PO TABS: 20.0000 mg | ORAL_TABLET | Freq: Every day | ORAL | Status: AC

## 2013-04-22 MED ORDER — DUTASTERIDE 0.5 MG PO CAPS: 0.5000 mg | ORAL_CAPSULE | Freq: Every day | ORAL | Status: DC

## 2013-04-22 MED ORDER — METHOCARBAMOL 500 MG PO TABS: 500.0000 mg | ORAL_TABLET | Freq: Four times a day (QID) | ORAL | Status: DC | PRN

## 2013-04-22 MED ORDER — AMLODIPINE BESYLATE 10 MG PO TABS: 10.0000 mg | ORAL_TABLET | Freq: Every day | ORAL | Status: DC

## 2013-04-22 MED ORDER — PANTOPRAZOLE SODIUM 40 MG PO TBEC: 40.0000 mg | DELAYED_RELEASE_TABLET | Freq: Every day | ORAL | Status: DC

## 2013-04-22 MED ORDER — IRBESARTAN 150 MG PO TABS: 150.0000 mg | ORAL_TABLET | Freq: Two times a day (BID) | ORAL | Status: DC

## 2013-04-22 MED ORDER — FERROUS SULFATE 325 (65 FE) MG PO TABS: 325.0000 mg | ORAL_TABLET | Freq: Three times a day (TID) | ORAL | Status: DC

## 2013-04-22 MED ORDER — ASPIRIN EC 81 MG PO TBEC: 81.0000 mg | DELAYED_RELEASE_TABLET | Freq: Every day | ORAL | Status: DC

## 2013-04-22 MED ORDER — TAMSULOSIN HCL 0.4 MG PO CAPS: 0.4000 mg | ORAL_CAPSULE | Freq: Every day | ORAL | Status: AC

## 2013-04-22 MED ORDER — SELEGILINE HCL 5 MG PO CAPS: 5.0000 mg | ORAL_CAPSULE | Freq: Two times a day (BID) | ORAL | Status: DC

## 2013-04-22 MED ORDER — HYDROCODONE-ACETAMINOPHEN 5-325 MG PO TABS: 1.0000 | ORAL_TABLET | Freq: Four times a day (QID) | ORAL | Status: DC | PRN

## 2013-04-22 NOTE — Progress Notes (Signed)
Social Work Patient ID: Ivan Clark, male   DOB: 26-Nov-1928, 79 y.o.   MRN: [id number] Met with pt, daughter and wife to discuss discharge tomorrow.  Wife wants a rolling walker added to the DME order. Family education went well and they feel ready for discharge.

## 2013-04-22 NOTE — Progress Notes (Signed)
79 y.o. right-handed male with history of orthostatic hypotension as well as autonomic dysfunction from Parkinson's disease. Patient independent prior to admission at times using a walker. Admitted 04/09/2013 after a fall when he lost his balance landing on his right hip without loss of consciousness. X-rays and imaging revealed a displaced right femoral subcapital fracture. Underwent right hip hemiarthroplasty 04/09/2013 per Dr. [name]. Weightbearing as tolerated with posterior hip precautions. Postoperative pain management. Subcutaneous Lovenox for DVT prophylaxis. Acute blood loss anemia 10.3 and monitored. He remains on Sinemet for history of Parkinson's disease  Subjective/Complaints: Good outing, no new c/os  Review of Systems - Negative except Weakness  Objective: Vital Signs: Blood pressure 172/70, pulse 78, temperature 97.8 F (36.6 C), temperature source Oral, resp. rate 18, weight 77.61 kg (171 lb 1.6 oz), SpO2 94.00%. No results found. Results for orders placed during the hospital encounter of 04/14/13 (from the past 72 hour(s))  CREATININE, SERUM     Status: Abnormal   Collection Time    04/21/13  5:05 AM      Result Value Range   Creatinine, Ser 0.90  0.50 - 1.35 mg/dL   GFR calc non Af Amer 77 (*) >90 mL/min   GFR calc Af Amer 89 (*) >90 mL/min   Comment: (NOTE)     The eGFR has been calculated using the CKD EPI equation.     This calculation has not been validated in all clinical situations.     eGFR's persistently <90 mL/min signify possible Chronic Kidney     Disease.     HEENT: normal Cardio: RRR and No murmurs Resp: CTA B/L and Unlabored GI: BS positive and Nondistended, nontender Extremity:  No Edema Skin:   Intact and Wound Right hip with staples, no drainage or erythema Neuro: Alert/Oriented, Flat, Cranial Nerve II-XII normal, Normal Sensory and Abnormal Motor 4 minus right hip flexor knee extensor and 5/5 ankle dorsiflexor plantar flexor5/5 left hip flexor  knee extensor ankle the flexor plantarflexion5/5 bilateral upper extremity Musc/Skel:  Swelling Left thigh, no erythema or tenderness General no acute distress   Assessment/Plan: 1. Functional deficits secondary to  right femoral neck fracture in a patient with Parkinson's disease after a fall 04/09/2013  which require 3+ hours per day of interdisciplinary therapy in a comprehensive inpatient rehab setting. Physiatrist is providing close team supervision and 24 hour management of active medical problems listed below. Physiatrist and rehab team continue to assess barriers to discharge/monitor patient progress toward functional and medical goals. Should be ready for D/c in am FIM: FIM - Bathing Bathing Steps Patient Completed: Chest;Right Arm;Left Arm;Abdomen;Front perineal area;Right upper leg;Left upper leg;Buttocks;Right lower leg (including foot);Left lower leg (including foot) Bathing: 5: Supervision: Safety issues/verbal cues  FIM - Upper Body Dressing/Undressing Upper body dressing/undressing steps patient completed: Pull shirt over trunk;Put head through opening of pull over shirt/dress;Thread/unthread left sleeve of pullover shirt/dress;Thread/unthread right sleeve of pullover shirt/dresss Upper body dressing/undressing: 5: Supervision: Safety issues/verbal cues FIM - Lower Body Dressing/Undressing Lower body dressing/undressing steps patient completed: Thread/unthread left underwear leg;Thread/unthread left pants leg;Pull pants up/down;Pull underwear up/down;Thread/unthread right pants leg;Thread/unthread right underwear leg;Fasten/unfasten pants;Don/Doff right sock;Don/Doff left sock Lower body dressing/undressing: 4: Min-Patient completed 75 plus % of tasks  FIM - Toileting Toileting steps completed by patient: Adjust clothing prior to toileting;Performs perineal hygiene;Adjust clothing after toileting Toileting Assistive Devices: Grab bar or rail for support Toileting: 5:  Supervision: Safety issues/verbal cues  FIM - Diplomatic Services operational officer Devices: Grab bars;Walker Toilet Transfers: 6-Assistive  device: No helper;6-From toilet/BSC  FIM - Banker Devices: Walker;Arm rests Bed/Chair Transfer: 5: Chair or W/C > Bed: Supervision (verbal cues/safety issues);5: Bed > Chair or W/C: Supervision (verbal cues/safety issues)  FIM - Locomotion: Wheelchair Distance: 100 Locomotion: Wheelchair: 1: Total Assistance/staff pushes wheelchair (Pt<25%) FIM - Locomotion: Ambulation Locomotion: Ambulation Assistive Devices: Designer, industrial/product Ambulation/Gait Assistance: 5: Supervision Locomotion: Ambulation: 5: Travels 150 ft or more with supervision/safety issues  Comprehension Comprehension Mode: Auditory Comprehension: 6-Follows complex conversation/direction: With extra time/assistive device  Expression Expression Mode: Verbal Expression: 6-Expresses complex ideas: With extra time/assistive device  Social Interaction Social Interaction: 7-Interacts appropriately with others - No medications needed.  Problem Solving Problem Solving: 6-Solves complex problems: With extra time  Memory Memory: 6-More than reasonable amt of time  Medical Problem List and Plan:  1. Displaced right femoral neck fracture. Status post hemiarthroplasty 04/09/2013, plan staple removal prior to D/C  2. DVT Prophylaxis/Anticoagulation: Subcutaneous Lovenox. Check vascular studies and  3. Pain Management: Hydrocodone and Robaxin as needed. Monitor with increased mobility  4. Neuropsych: This patient is capable of making decisions on his own behalf.  5. Acute blood loss anemia. Continue iron supplement followup CBC  6. History of orthostatic hypotension. Continue Florinef. Monitor closely with increased mobility  7. Parkinson's disease. Sinemet as directed as well as Eldepryl 5 mg twice a day.Follow up Dr. Terrace Arabia  8.BPH. Continue  Avodart as well as Flomax. Need to watch blood pressures closely. Discuss home regimen with patient. Has chronic urinary freq 9. Hyperlipidemia. Lipitor   10. Dysphagia secondary to Parkinson's disease plus Botox injection improved 11.  Elevated sys BP positive orthostatics, asymptomatic, will adjust meds to keep normotensive in standing position LOS (Days) 8 A FACE TO FACE EVALUATION WAS PERFORMED  Ivan Clark 04/22/2013, 6:40 AM

## 2013-04-22 NOTE — Progress Notes (Signed)
Occupational Therapy Discharge Summary  Patient Details  Name: Ivan Clark MRN: [id number] Date of Birth: 1928-12-09  Today's Date: 04/22/2013 Time: 1100-1147 Time Calculation (min): 47 min  Patient has met 8 of 8 long term goals due to improved activity tolerance, improved balance, ability to compensate for deficits and adhering to THPs.  Patient to discharge at overall Supervision level.  Patient's care partner assisted patient with BADL tasks and shower transfer PTA has observed OT sessions independent to provide the necessary physical and vcs for THPs. assistance at discharge.    Reasons goals not met: n/a secondary to all goals met  Recommendation:  Patient will benefit from ongoing skilled OT services in home health setting to continue to advance functional skills in the area of BADL and Reduce care partner burden.  Equipment: 3 in 1 commode and AE.  Reasons for discharge: treatment goals met and discharge from hospital  Patient/family agrees with progress made and goals achieved: Yes  OT Discharge Precautions/Restrictions  Restrictions Weight Bearing Restrictions: Yes RLE Weight Bearing: Weight bearing as tolerated Pain Pain Assessment Pain Assessment: No/denies pain ADL Overall supervision-min assist, refer to FIM for details Cognition Overall Cognitive Status: Within Functional Limits for tasks assessed Orientation Level: Oriented X4 Memory:  (occasional vcs to adhere to THP during BADL tasks.) Sensation Sensation Light Touch: Appears Intact BUEs Coordination Gross Motor Movements are Fluid and Coordinated: No Fine Motor Movements are Fluid and Coordinated: No (BUEs, more deficits on right) Coordination and Movement Description: Volitional/action tremors noted with BUE use and patient with BUE decreased speed, accuracy and rhythm, most noted during left GMC and right FMC. Motor  Motor Motor: Abnormal postural alignment and control;Other (comment) Motor -  Discharge Observations: BUE volitional/action tremors; rigidity associated with Parkinson's; flexed trunk with forward head posture, neck rigidity Mobility  Transfers Sit to Stand: 5: Supervision;With upper extremity assist;From chair/3-in-1 Stand to Sit: 5: Supervision;Without upper extremity assist;To chair/3-in-1  Trunk/Postural Assessment  Cervical Assessment Cervical Assessment: Exceptions to [facility] (Continues with significant cervical flexion, capital extension, limited rotation and extension) Thoracic Assessment Thoracic Assessment: Exceptions to [facility] (increased kyphosis) Lumbar Assessment Lumbar Assessment: Exceptions to WFL (decreased lordosis) Postural Control Postural Control: Deficits on evaluation (delayed/impaired balance reactions; falls posterior with reaching)  Balance Static Sitting Balance Static Sitting - Balance Support: Feet supported;Feet unsupported Static Sitting - Level of Assistance: 6: Modified independent (Device/Increase time) Dynamic Sitting Balance Dynamic Sitting - Balance Support: Feet supported;Feet unsupported Dynamic Sitting - Level of Assistance: 6: Modified independent (Device/Increase time) Static Standing Balance Static Standing - Balance Support: Right upper extremity supported;Left upper extremity supported Static Standing - Level of Assistance: 6: Modified independent (Device/Increase time) Dynamic Standing Balance Dynamic Standing - Balance Support: Right upper extremity supported;Left upper extremity supported Dynamic Standing - Level of Assistance: 5: Stand by assistance Extremity/Trunk Assessment RUE Assessment RUE Assessment: Within Functional Limits (AROM and MMT) LUE Assessment LUE Assessment: Within Functional Limits (AROM and MMT)  See FIM for current functional status  Ranell Finelli 04/22/2013, 4:42 PM

## 2013-04-22 NOTE — Discharge Summary (Signed)
Discharge summary job [phone]

## 2013-04-22 NOTE — Progress Notes (Signed)
Social Work Discharge Note Discharge Note  The overall goal for the admission was met for:   Discharge location: Yes-HOME WITH WIFE WHO CAN PROVIDE ASSIST  Length of Stay: Yes-8 DAYS  Discharge activity level: Yes-SUPERVISION/MIN LEVEL  Home/community participation: Yes  Services provided included: MD, RD, PT, OT, RN, Pharmacy and SW  Financial Services: Private Insurance: BLUE MEDICARE  Follow-up services arranged: Home Health: [facility]-PT,OT, DME: [facility]-B[facility] and Patient/Family has no preference for HH/DME agencies ADDED ROLLING WALKER  Comments (or additional information):FAMILY EDUCATION COMPLETED TODAY-DID ACTUAL CAR TRANSFER WITH WIFE, BOTH COMFORTABLE WITH CARE AND DISCHARGE TOMORROW  Patient/Family verbalized understanding of follow-up arrangements: Yes  Individual responsible for coordination of the follow-up plan: SELF & SANDRA-WIFE  Confirmed correct DME delivered: Lucy Chris 04/22/2013    Lucy Chris

## 2013-04-22 NOTE — Progress Notes (Signed)
Physical Therapy Discharge Summary  Patient Details  Name: Ivan Clark MRN: [id number] Date of Birth: 28-Jan-1929  Today's Date: 04/22/2013 Time: 0805-0900 and 1100-1147 Time Calculation (min): 55 min and 47 min   Patient has met 11 of 11 long term goals due to improved activity tolerance, improved balance, improved postural control, increased strength, decreased pain, improved awareness and improved memory of total hip precautions.  Patient to discharge at an ambulatory level Supervision with RW with intermittent min A for stair negotiation.   Patient's care partner is independent to provide the necessary physical and supervision assistance at discharge.  Reasons goals not met: All goals met  Recommendation:  Patient will benefit from ongoing skilled PT services in home health setting to continue to advance safe functional mobility, address ongoing impairments in LE weakness, core and neck weakness leading to impaired postural control, balance, gait, and minimize fall risk.  Equipment: 2WW  Reasons for discharge: treatment goals met and discharge from hospital  Patient/family agrees with progress made and goals achieved: Yes  PT Discharge Precautions/Restrictions  WBAT RLE with posterior hip precautions; hand out given Vital Signs Therapy Vitals Temp: 97.8 F (36.6 C) Temp src: Oral Pulse Rate: 78 Resp: 18 BP: 172/70 mmHg Patient Position, if appropriate: Lying Oxygen Therapy SpO2: 94 % O2 Device: None (Room air) Pain Pain Assessment Pain Assessment: No/denies pain Sensation Sensation Light Touch: Impaired Detail Light Touch Impaired Details: Absent RLE;Absent LLE Stereognosis: Not tested Hot/Cold: Not tested Proprioception: Not tested Additional Comments: Pt reports h/o numbness bilat feet secondary to neuropathy Coordination Gross Motor Movements are Fluid and Coordinated: Not tested Fine Motor Movements are Fluid and Coordinated: Not tested Motor   Motor Motor: Abnormal postural alignment and control;Other (comment) Motor - Discharge Observations: BUE volitional/action tremors; rigidity associated with Parkinson's; flexed trunk with forward head posture, neck rigidity  Mobility Transfers Stand Pivot Transfers: 5: Supervision Stand Pivot Transfer Details (indicate cue type and reason): With RW supervision with no verbal cues needed to adhere to hip precautions; stand pivot without AD with min A for balance Locomotion  Ambulation Ambulation/Gait Assistance: 5: Supervision Ambulation Distance (Feet): 150 Feet Assistive device: Rolling walker Ambulation/Gait Assistance Details: Tactile cues for posture Ambulation/Gait Assistance Details: Supervision with RW, min A without AD in controlled environment. Still requires verbal cues for thoracic and cervical extension and upright head/gaze Gait Gait Pattern: Step-through pattern;Decreased step length - left;Decreased stance time - right;Decreased weight shift to right;Trunk flexed;Lateral trunk lean to right;Lateral trunk lean to left;Decreased trunk rotation High Level Ambulation High Level Ambulation: Side stepping;Backwards walking Side Stepping: without AD with min A x 25' to L and 25' to R, decreased weight shift to R; verbal cues for increased stance time R Backwards Walking: without AD with min A x 50'; verbal cues for increased stance time R  Trunk/Postural Assessment  Cervical Assessment Cervical Assessment: Exceptions to [facility] (Continues with significant cervical flexion, capital extension, limited rotation and extension) Thoracic Assessment Thoracic Assessment: Exceptions to [facility] (increased kyphosis) Lumbar Assessment Lumbar Assessment: Exceptions to WFL (decreased lordosis) Postural Control Postural Control: Deficits on evaluation (delayed/impaired balance reactions; falls posterior with reaching)  Balance Static Sitting Balance Static Sitting - Balance Support: Feet  supported;Feet unsupported Static Sitting - Level of Assistance: 6: Modified independent (Device/Increase time) Dynamic Sitting Balance Dynamic Sitting - Balance Support: Feet supported;Feet unsupported Dynamic Sitting - Level of Assistance: 6: Modified independent (Device/Increase time) Static Standing Balance Static Standing - Balance Support: Right upper extremity supported;Left upper extremity supported Static  Standing - Level of Assistance: 6: Modified independent (Device/Increase time) Dynamic Standing Balance Dynamic Standing - Balance Support: Right upper extremity supported;Left upper extremity supported Dynamic Standing - Level of Assistance: 5: Stand by assistance Dynamic Standing - Balance Activities: Forward lean/weight shifting;Reaching for objects;Lateral lean/weight shifting;Reaching across midline;Other (comment) (2 x 4 balance beam) Dynamic Standing - Comments: Focused balance training on ankle and hip strategies with anterior/posterior leans from wall with feet close and far away from wall to elicit ankle closed chain PF and DF and hip flexion <> extension; also performed balance training standing on 2x4 and reaching up to place checkers on tall magnetic board alternating R and LUE reaching forwards and across midline beginning with one UE support for balance progressing to no UE support for balance but min A Extremity Assessment  RLE Strength RLE Overall Strength: Deficits;Due to precautions RLE Overall Strength Comments: Hip flexion not tested past 90 deg secondary to hip precautions; knee flexion/extension, ankle DF 4+/5 LLE Strength LLE Overall Strength Comments: Improved to 5/5  Second session:  Pt wife and daughter present for family education and functional mobility training.  Performed actual car transfer with stand pivot with RW with pt requiring min A to place RLE into car but pt able to take RLE out of car with supervision.  Advised family to begin with seat in  forward position and then once LLE is inside of car sliding seat as far back as possible to minimize amount of hip flexion needed to place RLE into car; demonstrated and verbalized sequence to family and had wife give repeat demonstration.  Performed stair negotiation training up/down 4 stairs with bilat rails and up/down one step with RW and supervision with verbal cues for RW management and for family to place RW up on landing or down on ground for pt; pt able to verbalize and demonstrate safe stepping sequence.  Elevated mat to height of bed at home and had pt demonstrate sit > supine sequence with supervision.  When performing supine > sit pt lowered bilat LE with wife's assistance but then required wife's mod A to sit straight up from bed; wife reported this was very strenuous for her.  Demonstrated to pt and family half roll and use of UE to push to sitting.  Pt gave repeat demonstration of rolling sequence and side > sit with min A.  Returned to room with RW and supervision.  Discussed equipment and f/u therapy with pt and family.  Pt and family with no further questions or concerns.    See FIM for current functional status  [name] [facility] 04/22/2013, 9:06 AM

## 2013-04-22 NOTE — Progress Notes (Signed)
;.  Occupational Therapy Note  Patient Details  Name: Ivan Clark MRN: [id number] Date of Birth: [birth year] Today's Date: 04/22/2013  Time: 1445- 1630 (45 min) Pain:  none Individual session  Engaged in transfers, sit to stand, functional mobility.  Pt. Ambulated to bathroom with supervision using RW.  Was supervision with clothes management and peri care.  Pt. Ambulated to sink and washed hands and brushed teeth in standing.  Pt. Ambulated to wc.  Used AE to doff shoes and donn footies.  Needed minimal cues with donning socks.   Needed assistance with untying  Shoes.  Left pt in wc with call belll in reach and phone.     Humberto Seals  04/22/2013, 4:12 PM

## 2013-04-23 NOTE — Progress Notes (Signed)
Pt discharged to home with wife and family. Discharge instructions given by Deatra Ina, PA with verbal understanding.

## 2013-04-23 NOTE — Progress Notes (Signed)
79 y.o. right-handed male with history of orthostatic hypotension as well as autonomic dysfunction from Parkinson's disease. Patient independent prior to admission at times using a walker. Admitted 04/09/2013 after a fall when he lost his balance landing on his right hip without loss of consciousness. X-rays and imaging revealed a displaced right femoral subcapital fracture. Underwent right hip hemiarthroplasty 04/09/2013 per Dr. [name]. Weightbearing as tolerated with posterior hip precautions. Postoperative pain management. Subcutaneous Lovenox for DVT prophylaxis. Acute blood loss anemia 10.3 and monitored. He remains on Sinemet for history of Parkinson's disease  Subjective/Complaints: Staples out yesterday  Review of Systems - Negative except Weakness  Objective: Vital Signs: Blood pressure 131/65, pulse 80, temperature 98.2 F (36.8 C), temperature source Oral, resp. rate 17, weight 77.61 kg (171 lb 1.6 oz), SpO2 96.00%. No results found. Results for orders placed during the hospital encounter of 04/14/13 (from the past 72 hour(s))  CREATININE, SERUM     Status: Abnormal   Collection Time    04/21/13  5:05 AM      Result Value Range   Creatinine, Ser 0.90  0.50 - 1.35 mg/dL   GFR calc non Af Amer 77 (*) >90 mL/min   GFR calc Af Amer 89 (*) >90 mL/min   Comment: (NOTE)     The eGFR has been calculated using the CKD EPI equation.     This calculation has not been validated in all clinical situations.     eGFR's persistently <90 mL/min signify possible Chronic Kidney     Disease.     HEENT: normal Cardio: RRR and No murmurs Resp: CTA B/L and Unlabored GI: BS positive and Nondistended, nontender Extremity:  No Edema Skin:   Intact and Wound Right hip with steristrips no drainage or erythema Neuro: Alert/Oriented, Flat, Cranial Nerve II-XII normal, Normal Sensory and Abnormal Motor 4 minus right hip flexor knee extensor and 5/5 ankle dorsiflexor plantar flexor5/5 left hip flexor  knee extensor ankle the flexor plantarflexion5/5 bilateral upper extremity Musc/Skel:  Swelling Left thigh, no erythema or tenderness General no acute distress   Assessment/Plan: 1. Functional deficits secondary to  right femoral neck fracture in a patient with Parkinson's disease after a fall 04/09/2013  which require 3+ hours per day of interdisciplinary therapy in a comprehensive inpatient rehab setting. Physiatrist is providing close team supervision and 24 hour management of active medical problems listed below. Physiatrist and rehab team continue to assess barriers to discharge/monitor patient progress toward functional and medical goals. Stable for D/C today F/u PCP in 1-2 weeks F/u ortho See D/C summary See D/C instructions FIM: FIM - Bathing Bathing Steps Patient Completed: Chest;Right Arm;Left Arm;Abdomen;Front perineal area;Right upper leg;Left upper leg;Buttocks;Right lower leg (including foot);Left lower leg (including foot) Bathing: 5: Supervision: Safety issues/verbal cues  FIM - Upper Body Dressing/Undressing Upper body dressing/undressing steps patient completed: Pull shirt over trunk;Put head through opening of pull over shirt/dress;Thread/unthread left sleeve of pullover shirt/dress;Thread/unthread right sleeve of pullover shirt/dresss Upper body dressing/undressing: 5: Supervision: Safety issues/verbal cues FIM - Lower Body Dressing/Undressing Lower body dressing/undressing steps patient completed: Thread/unthread left underwear leg;Thread/unthread left pants leg;Pull pants up/down;Pull underwear up/down;Thread/unthread right pants leg;Thread/unthread right underwear leg;Fasten/unfasten pants;Don/Doff right sock;Don/Doff left sock Lower body dressing/undressing: 4: Min-Patient completed 75 plus % of tasks  FIM - Toileting Toileting steps completed by patient: Adjust clothing prior to toileting;Performs perineal hygiene;Adjust clothing after toileting Toileting  Assistive Devices: Grab bar or rail for support Toileting: 5: Supervision: Safety issues/verbal cues  FIM - Archivist  Transfers Assistive Devices: Grab bars;Walker Toilet Transfers: 5-To toilet/BSC: Supervision (verbal cues/safety issues);5-From toilet/BSC: Supervision (verbal cues/safety issues)  FIM - Banker Devices: Therapist, occupational: 5: Chair or W/C > Bed: Supervision (verbal cues/safety issues);5: Bed > Chair or W/C: Supervision (verbal cues/safety issues);4: Supine > Sit: Min A (steadying Pt. > 75%/lift 1 leg);5: Sit > Supine: Supervision (verbal cues/safety issues)  FIM - Locomotion: Wheelchair Distance: 100 Locomotion: Wheelchair: 0: Activity did not occur (pt ambulatory on unit) FIM - Locomotion: Ambulation Locomotion: Ambulation Assistive Devices: Designer, industrial/product Ambulation/Gait Assistance: 5: Supervision Locomotion: Ambulation: 5: Travels 150 ft or more with supervision/safety issues  Comprehension Comprehension Mode: Auditory Comprehension: 6-Follows complex conversation/direction: With extra time/assistive device  Expression Expression Mode: Verbal Expression: 6-Expresses complex ideas: With extra time/assistive device  Social Interaction Social Interaction: 7-Interacts appropriately with others - No medications needed.  Problem Solving Problem Solving: 6-Solves complex problems: With extra time  Memory Memory: 6-More than reasonable amt of time  Medical Problem List and Plan:  1. Displaced right femoral neck fracture. Status post hemiarthroplasty 04/09/2013,f/u ortho 2. DVT Prophylaxis/Anticoagulation:D/C Subcutaneous Lovenox.  3. Pain Management: Hydrocodone and Robaxin as needed. Monitor with increased mobility  4. Neuropsych: This patient is capable of making decisions on his own behalf.  5. Acute blood loss anemia. Continue iron supplement followup CBC as outpt 6. History of orthostatic  hypotension. Continue Florinef. Monitor closely with increased mobility  7. Parkinson's disease. Sinemet as directed as well as Eldepryl 5 mg twice a day.Follow up Dr. Terrace Arabia  8.BPH. Continue Avodart as well as Flomax. Need to watch blood pressures closely. Discuss home regimen with patient. Has chronic urinary freq 9. Hyperlipidemia. Lipitor   10. Dysphagia secondary to Parkinson's disease plus Botox injection improved 11.  Elevated sys BP positive orthostatics, asymptomatic, will adjust meds to keep normotensive in standing position LOS (Days) 9 A FACE TO FACE EVALUATION WAS PERFORMED  Dannis Deroche E 04/23/2013, 6:07 AM

## 2013-04-23 NOTE — Discharge Summary (Signed)
NAMEMarland Kitchen  ISSACC, MERLO NO.:  [id number]  MEDICAL RECORD NO.:  [id number]  LOCATION:  4M01C                        FACILITY:  [facility]  PHYSICIAN:  Erick Colace, M.D.DATE OF BIRTH:  1929-04-02  DATE OF ADMISSION:  04/14/2013 DATE OF DISCHARGE:                              DISCHARGE SUMMARY   DISCHARGE DIAGNOSES: 1. Displaced right femoral neck fracture, status post hemiarthroplasty     April 09, 2013. 2. Subcutaneous Lovenox for deep vein thrombosis prophylaxis. 3. Pain management. 4. Acute blood loss anemia. 5. History of orthostatic hypotension. 6. Parkinson disease. 7. Benign prostatic hypertrophy. 8. Hyperlipidemia.  HISTORY OF PRESENT ILLNESS:  This 79 year old right-handed male with history or orthostatic hypotension as well as Parkinson disease.  He was independent prior to admission using a walker.  Admitted April 09, 2013, after a fall when he lost his balance landing on his right hip without loss of consciousness.  X-rays and imaging revealed a displaced right femoral subcapital fracture.  Underwent right hip hemiarthroplasty April 09, 2013, per Dr. [name].  Weightbearing as tolerated with posterior hip precautions.  Postoperative pain management.  Subcutaneous Lovenox for DVT prophylaxis.  Acute blood loss anemia 10.3 and monitored.  He remained on Sinemet for history of Parkinson disease. Physical and occupational therapy ongoing.  The patient was admitted for a comprehensive rehab program.  PAST MEDICAL HISTORY:  See discharge diagnoses.  SOCIAL HISTORY:  Lives with spouse.  FUNCTIONAL HISTORY:  Prior to admission independent with a walker.  FUNCTIONAL STATUS:  Upon admission to rehab services was max assist for stand pivot transfers.  PHYSICAL EXAMINATION:  VITAL SIGNS:  Blood pressure 168/65, pulse 79, temperature 98.4, respirations 16. GENERAL:  This was an alert male, oriented x3.  Flat affect.  He made good eye contact  with examiner.  Pupils round and reactive to light. LUNGS:  Clear to auscultation. CARDIAC:  Regular rate and rhythm. ABDOMEN:  Soft, nontender.  Good bowel sounds. EXTREMITIES:  He exhibited a mild tremor.  No rigidity with minimal cogwheeling.  Hip incision was clean and dry.  Staples intact.  REHABILITATION HOSPITAL COURSE:  The patient was admitted to [facility]patient [facility] with therapies initiated on a 3-hour daily basis consisting of physical therapy, occupational therapy, and rehabilitation nursing.  The following issues were addressed during the patient's rehabilitation stay.  Pertaining to Mr. Sturdy's displaced right femoral neck fracture, he had undergone hemiarthroplasty April 09, 2013, per Dr. [name].  Weightbearing as tolerated with posterior hip precautions.  Neurovascular sensation intact.  He had undergone venous Doppler studies showing no signs of DVT.  He remained on subcutaneous Lovenox for DVT prophylaxis throughout his rehab course.  Pain management ongoing with good results.  Minimal use of narcotics.  Acute blood loss anemia.  No bleeding episodes.  Latest hemoglobin of 11.8. His blood pressures remained well controlled.  He did have a documented history of orthostatic hypotension.  He remained on Florinef.  He continued on Sinemet for his Parkinson disease, followed by Neurology Service, Dr. Terrace Arabia.  Benign prostatic hypertrophy with Avodart as well as Flomax voiding without any difficulty.  The patient received weekly collaborative interdisciplinary team conferences to discuss estimated length of stay, family teaching,  and any barriers to discharge.  The patient participated in community launchings as well as day passes with good results ambulating with a rolling walker with energy conservation techniques.  He was able to problem solve through various scenarios in the community navigating steps and curb with rolling walker.  Still needing some assist for  lower body activities of daily living.  Full family teaching was completed and plan was to be discharged to home with his wife.  DISCHARGE MEDICATIONS: 1. Norvasc 10 mg p.o. daily. 2. Aspirin 81 mg p.o. daily. 3. Lipitor 20 mg p.o. daily. 4. Vitamin B complex 1 tablet daily. 5. Sinemet 50-200, 1 tablet at bedtime and Sinemet 25-100, 1 tablet     t.i.d. 6. Vitamin D 2000 units p.o. daily. 7. Avodart 0.5 mg p.o. daily. 8. Ferrous sulfate 325 mg p.o. t.i.d. hold for constipation. 9. Florinef 0.1 mg p.o. daily. 10.Hydrocodone 1-2 tablets every 6 hours as needed for pain, dispense     of 90 tablets. 11.Avapro 150 mg p.o. b.i.d. 12.Robaxin 500 mg p.o. every 6 hours as needed for muscle spasms. 13.Multivitamin 1 tablet daily. 14.Protonix 40 mg p.o. daily. 15.MiraLax 17 g p.o. b.i.d. hold for loose stools. 16.Eldepryl 5 mg p.o. b.i.d. 17.Senokot-S tablets 2 p.o. b.i.d. 18.Flomax 0.4 mg p.o. at bedtime.  DIET:  Regular.  SPECIAL INSTRUCTIONS:  Weightbearing as tolerated with posterior hip precautions.  The patient will [facility]  Dr. Duane Lope medical management, May 03, 2013.  Dr. Patsy Lager 2 weeks call for appointment. Ongoing therapies were arranged as per rehab services.     Mariam Dollar, P.A.   ______________________________ Erick Colace, M.D.    DA/MEDQ  D:  04/22/2013  T:  04/23/2013  Job:  [id number]  cc:   Magnus Sinning) Tenny Craw, M.D. Erick Colace, M.D. Almedia Balls. [name], M.D.

## 2013-05-09 ENCOUNTER — Other Ambulatory Visit: Payer: Self-pay | Admitting: Neurology

## 2013-05-16 ENCOUNTER — Other Ambulatory Visit: Payer: Self-pay | Admitting: Neurology

## 2013-05-19 ENCOUNTER — Ambulatory Visit: Payer: Medicare Other

## 2013-05-19 ENCOUNTER — Ambulatory Visit: Payer: Medicare Other | Admitting: Physical Therapy

## 2013-05-19 ENCOUNTER — Ambulatory Visit: Payer: Medicare Other | Admitting: Occupational Therapy

## 2013-05-24 DIAGNOSIS — IMO0001 Reserved for inherently not codable concepts without codable children: Secondary | ICD-10-CM

## 2013-05-24 DIAGNOSIS — Z9181 History of falling: Secondary | ICD-10-CM

## 2013-05-24 DIAGNOSIS — G20 Parkinson's disease: Secondary | ICD-10-CM

## 2013-05-24 DIAGNOSIS — M1610 Unilateral primary osteoarthritis, unspecified hip: Secondary | ICD-10-CM

## 2013-05-24 DIAGNOSIS — S72009D Fracture of unspecified part of neck of unspecified femur, subsequent encounter for closed fracture with routine healing: Secondary | ICD-10-CM

## 2013-07-08 ENCOUNTER — Ambulatory Visit: Payer: Medicare Other | Admitting: Neurology

## 2013-07-10 ENCOUNTER — Other Ambulatory Visit: Payer: Self-pay | Admitting: Neurology

## 2013-07-11 ENCOUNTER — Telehealth: Payer: Self-pay

## 2013-07-11 NOTE — Telephone Encounter (Signed)
Pharmacy aware to contact pcp for tamsulosin refill.

## 2013-07-11 NOTE — Telephone Encounter (Signed)
Please contact PCP Filbert Schilder.D.

## 2013-07-11 NOTE — Telephone Encounter (Signed)
Refill request from [facility] for tamsulosin 0.4mg .  Please advise.

## 2013-07-13 ENCOUNTER — Ambulatory Visit (INDEPENDENT_AMBULATORY_CARE_PROVIDER_SITE_OTHER): Payer: Medicare Other | Admitting: Neurology

## 2013-07-13 ENCOUNTER — Encounter: Payer: Self-pay | Admitting: Neurology

## 2013-07-13 VITALS — Ht 65.0 in | Wt 186.0 lb

## 2013-07-13 DIAGNOSIS — S72009A Fracture of unspecified part of neck of unspecified femur, initial encounter for closed fracture: Secondary | ICD-10-CM

## 2013-07-13 DIAGNOSIS — S72001A Fracture of unspecified part of neck of right femur, initial encounter for closed fracture: Secondary | ICD-10-CM

## 2013-07-13 DIAGNOSIS — G20 Parkinson's disease: Secondary | ICD-10-CM

## 2013-07-13 DIAGNOSIS — G243 Spasmodic torticollis: Secondary | ICD-10-CM

## 2013-07-13 MED ORDER — CARBIDOPA-LEVODOPA 25-100 MG PO TABS: 1.0000 | ORAL_TABLET | Freq: Three times a day (TID) | ORAL | Status: DC

## 2013-07-13 MED ORDER — CARBIDOPA-LEVODOPA ER 50-200 MG PO TBCR: 1.0000 | EXTENDED_RELEASE_TABLET | Freq: Every day | ORAL | Status: DC

## 2013-07-13 MED ORDER — SELEGILINE HCL 5 MG PO CAPS
5.0000 mg | ORAL_CAPSULE | Freq: Two times a day (BID) | ORAL | Status: DC
Start: 2013-07-13 — End: 2014-07-18

## 2013-07-13 NOTE — Progress Notes (Signed)
Ivan Clark is a 80 year old Caucasian male, accompanied by his wife at today's clinical visit. His primary care physician is [name],   History: He has past medical history of bladder cancer, hypertension, prostate hypertrophy, over the past 20 years, he also developed gradual onset of anterocollois.  Since 2012, wife noticed that he has slowed down significantly, has difficulty initiate gait, some small shuffling gait, occasionally bilateral hands tremor, recent few months, also developed frequent lightheadedness, most often when getting up from sitting position, no passing out,  patient described lightheaded funny feelings throughout his body during those episode.  He also has chronic constipation, acting out of dreams, loss of sense of smell for a long time period. He contributed to his long time smoking history, he quit 11 years ago he used to smoke 2 packs a day.  MRI of brain in August 2011: mild atrophy,small vessel disease, no acute lesions. MRA of brain was normal  He did have mild parkinsonian features, and I have started him on Sinemet 25/100 3 times a day, he tolerated the medication well, there is mild improvement in his gait, he underwent emergency appendectomy in June 2013, recovered well from his surgery. He denies memory trouble.  He was started on selegiline since 04/2012, did very well, but had few episodes of dizziness,   He had one fainting spell in March 5th 2014, he got up early morning, around 5:45 AM, he was in bathroom, he felt funny after taking few steps, landed on the floor, he was taken to [name], was found to have orthostatic hypotension, was sent to the emergency room, reported normal CT scan.  He continues to complain of lightheadedness when standing up quickly,  He began to receive EMG guided butulinum toxin injection for his anterocollis and sialorrhea since March 19th 2014, the first one was in March 2014, the second injection was in July 2nd 2014, each time,  he has mild improvement in straighting up his neck better, so he can walk better,  he denies significant side effect with each injection  The third injection was October 10th 2014 , the same amount of xeomin 100 units total, 50 units to sternocleidal mastoid muscles, 5 days after the injection, he began to notice difficulty swallowing, especially liquid, and hard object, eventually was admitted to hospital in October 20, swallowing study has demonstrated moderate pharyngeal and cervical esophageal dysphagia, characterized by decreased laryngeal elevation and hyoid excursion during the swallow, resulting in moderate residue with most consistencies on the upper esophagus and in the pyriform sinues. The thin and nectar thick liquids spill into the airway after the swallow before the patient was able to dry swallow in effort to clear the residue. Patient had good sensation, and consistently coughed with penetration/aspiration. With multiple dry swallows. The patient was able to clear purees, soft solids, and honey thick liquids without penetration/aspiration. He require NG tube for one day, now he is discharged to home.  His wife is taking good care of him, He can eat oatmeal, tomato soup, eggs, half banana, lemon pie,pear cups, apple sauce, cream potato, carrot, scrumbled eggs, banana yogurt,  [place] dressing. But he could not eat regular food.  He does not like the food thickening in his drink.  He occasionally has coughing, he can get rid off it quickly.  He denies significant pain, he was able to raise up his neck better than any of the previous injection.  UPDATE March 4th 2015: He fell in April 09, 2013, with  right hip fracture, he had a right hemiarthroplasty, recovered very well, is ambulating without assistance again, no significant pain, he continued to have gradual worsening anterocollis, is wearing a soft collar, which has been helpful,  He has no dysphagia, he is taking Sinemet IR 25/100 3  times a day, Sinemet CR 50/200 one tablet every night. He has no significant memory trouble, mild REM sleep disorder     Physical Exam    Neurologic Exam  Mental Status:  awake, alert, cooperative to history, talking, and casual conversation, moderate to severe anterocollis, moderate, left turn, mild right tilt Cranial Nerves: CN II-XII pupils were equal round reactive to light.  Extraocular movements were full.  Visual fields were full on confrontational test.  Facial sensation and strength were normal.  Hearing was intact to finger rubbing bilaterally.  Uvula tongue were midline.  Head turning and shoulder shrugging were normal and symmetric.  Tongue protrusion into the cheeks strength were normal.  Motor:  mild to moderate, right more left limb, and nuchal moderate rigidity. no weakness, no sigificant tremor noticed. early fatiguability on rapid wrist opening and closure, finger tapping.  He was able to raise his neck up to 90 Sensory:  length-dependent decreased  light touch, pinprick,  and decreased vibratory sensation at toes. Coordination: Normal finger-to-nose, heel-to-shin.  There was no dysmetria noticed. Gait and Station: Can arise from chair without use of chairarm,  Narrow based gait, he has decreased  arm swing, no  shuffling gait, smooth turning.  Reflexes: Deep tendon reflexes: Biceps: 2/2, Brachioradialis: 2/2, Triceps: 2/2, Pateller: 2/2, Achilles: 1/1.  Plantar responses are flexor.  Assessment and plan 79 year old gentleman presenting with idiopathic parkinsonism, he had developed dysphagia after Xeomin injection in October 2014, he has no breathing difficulties, recent fall, and right femoral fracture, right hemiarthroplasty, doing very well,  Refill his medications, Sinemet IR 25/100 3 times a day, Sinemet CR 50/200 mg once every night, selegiline 5 mg twice a day  Return to clinic in 9 months with Ivan Clark

## 2013-07-28 ENCOUNTER — Other Ambulatory Visit (HOSPITAL_COMMUNITY): Payer: Self-pay | Admitting: Physician Assistant

## 2013-08-28 ENCOUNTER — Other Ambulatory Visit: Payer: Self-pay | Admitting: Neurology

## 2013-08-29 ENCOUNTER — Telehealth: Payer: Self-pay | Admitting: Neurology

## 2013-08-29 NOTE — Telephone Encounter (Signed)
A one year Rx was sent to the pharmacy last month when patient was in for OV.  I called the pharmacy.  Spoke with Santiago Glad.  She said they do have the Rx we sent, however they say the patient did not call them to request refill.  They will fill it now.  I called the patient to advise refills were sent when he was in for OV.  They had not contacted the pharmacy.  He will follow up with them and call us back if needed.

## 2013-08-29 NOTE — Telephone Encounter (Signed)
Pt's wife called asking for refill on pt's medication carbidopa-levodopa (SINEMET IR) 25-100 MG per tablet, please call pt when this has been called in. Thanks

## 2013-08-31 ENCOUNTER — Other Ambulatory Visit: Payer: Self-pay | Admitting: Neurology

## 2013-08-31 ENCOUNTER — Other Ambulatory Visit (HOSPITAL_COMMUNITY): Payer: Self-pay | Admitting: Physician Assistant

## 2013-10-19 ENCOUNTER — Telehealth: Payer: Self-pay

## 2013-10-20 NOTE — Telephone Encounter (Signed)
Refill should go to PCP [name]. He has refilled in the past.

## 2013-11-11 ENCOUNTER — Other Ambulatory Visit: Payer: Self-pay | Admitting: Neurology

## 2013-11-17 ENCOUNTER — Encounter: Payer: Self-pay | Admitting: Interventional Cardiology

## 2014-02-16 ENCOUNTER — Telehealth: Payer: Self-pay | Admitting: *Deleted

## 2014-02-16 NOTE — Telephone Encounter (Signed)
Spoke with patient to r/s appointment time on 12/4, patient's time was changed to 9:30 am with NP LL. NP CM admin time.

## 2014-02-24 ENCOUNTER — Other Ambulatory Visit: Payer: Self-pay

## 2014-04-14 ENCOUNTER — Ambulatory Visit: Payer: Self-pay | Admitting: Nurse Practitioner

## 2014-04-21 ENCOUNTER — Ambulatory Visit (INDEPENDENT_AMBULATORY_CARE_PROVIDER_SITE_OTHER): Payer: Medicare Other | Admitting: Neurology

## 2014-04-21 ENCOUNTER — Encounter: Payer: Self-pay | Admitting: Neurology

## 2014-04-21 VITALS — BP 169/85 | HR 78 | Ht 67.0 in | Wt 192.5 lb

## 2014-04-21 DIAGNOSIS — G20 Parkinson's disease: Secondary | ICD-10-CM

## 2014-04-21 DIAGNOSIS — I10 Essential (primary) hypertension: Secondary | ICD-10-CM

## 2014-04-21 DIAGNOSIS — N400 Enlarged prostate without lower urinary tract symptoms: Secondary | ICD-10-CM

## 2014-04-21 DIAGNOSIS — S72001D Fracture of unspecified part of neck of right femur, subsequent encounter for closed fracture with routine healing: Secondary | ICD-10-CM

## 2014-04-21 DIAGNOSIS — G243 Spasmodic torticollis: Secondary | ICD-10-CM

## 2014-04-21 MED ORDER — CARBIDOPA-LEVODOPA 25-100 MG PO TABS: 1.0000 | ORAL_TABLET | Freq: Three times a day (TID) | ORAL | Status: DC

## 2014-04-21 MED ORDER — RASAGILINE MESYLATE 1 MG PO TABS: 1.0000 mg | ORAL_TABLET | Freq: Every day | ORAL | Status: DC

## 2014-04-21 MED ORDER — CARBIDOPA-LEVODOPA ER 50-200 MG PO TBCR: 1.0000 | EXTENDED_RELEASE_TABLET | Freq: Every day | ORAL | Status: DC

## 2014-04-21 NOTE — Progress Notes (Signed)
Ivan Clark is a 80 year old Caucasian male, follow up for Parkinson's disease, cervical dystonia. His primary care physician is [name],   History: He has past medical history of bladder cancer, hypertension, prostate hypertrophy, over the past 20 years, he also developed gradual onset of anterocollois.  Since 2012, wife noticed that he has slowed down significantly, has difficulty initiate gait, some small shuffling gait, occasionally bilateral hands tremor, he also developed frequent lightheadedness, happen when he gets up from sitting position, no passing out, consistent with a orthostatic blood pressure changes.  He also has chronic constipation, acting out of dreams, loss of sense of smell for a long time period. He contributed to his long time smoking history, he quit 11 years ago he used to smoke 2 packs a day.  MRI of brain in August 2011: mild atrophy,small vessel disease, no acute lesions. MRA of brain was normal  On examination he has mild parkinsonian features, Sinemet 25/100 3 times a day since 2012, he tolerated the medication well, there is mild improvement in his gait, he underwent emergency appendectomy in June 2013, recovered well from his surgery. He denies memory trouble.  Selegiline since 04/2012, did very well, but had few episodes of dizziness,   Fainting spell in March 5th 2014, he got up early morning, around 5:45 AM, he was in bathroom, he felt funny after taking few steps, landed on the floor, he was taken to [name], was found to have orthostatic hypotension, was sent to the emergency room, reported normal CT scan.  He continues to complain of lightheadedness when standing up quickly,  He began to receive EMG guided butulinum toxin injection for his anterocollis and sialorrhea since March 19th 2014, the first one was in March 2014, the second injection was in July 2nd 2014, each time, he has mild improvement in straighting up his neck better, so he can walk better,  he  denies significant side effect with each injection  The third injection was October 10th 2014 , the same amount of xeomin 100 units total, 50 units to sternocleidal mastoid muscles, 5 days after the injection, he began to notice difficulty swallowing, especially liquid, and hard object, eventually was admitted to hospital in February 28, 2013 swallowing study has demonstrated moderate pharyngeal and cervical esophageal dysphagia.  He require NG tube for one day, dysphagia gradually resolved  He fell in April 09, 2013, with right hip fracture, he had a right hemiarthroplasty, recovered very well, is ambulating without assistance again, He is taking Sinemet IR 25/100 3 times a day, Sinemet CR 50/200 one tablet every night. He has no significant memory trouble, mild REM sleep disorder    UPDATE Apr 21 2014: He is doing good, exercise regularly with a personal  trainier twice a week, stretching his legs,  he continue have gradual worsening neck flexion forward, denies significant swallowing difficulty, has occasionally choking episode on certain texture of food, such as grapes skin, he still eats regular food, drinking water without difficulty  Over the past few months, he noticed early morning calf muscle cramping, worsening low back pain, bilateral feet numbness, no bowel and bladder incontinence.   Physical Exam    Neurologic Exam  Mental Status:  awake, alert, cooperative to history, talking, and casual conversation, moderate to severe anterocollis, mild left turn, moderate right tilt Cranial Nerves: CN II-XII pupils were equal round reactive to light.  Extraocular movements were full.  Visual fields were full on confrontational test.  Facial sensation and strength  were normal.  Hearing was intact to finger rubbing bilaterally.  Uvula tongue were midline.  Head turning and shoulder shrugging were normal and symmetric.  Tongue protrusion into the cheeks strength were normal.  Motor:  mild to  moderate, right more left limb, and nuchal moderate rigidity. no weakness, no sigificant tremor noticed. early fatiguability on rapid wrist opening and closure, finger tapping.  He was able to raise his neck up to 90 maximum Sensory:  length-dependent decreased  light touch, pinprick,  and decreased vibratory sensation to ankle level Coordination: Normal finger-to-nose, heel-to-shin.  There was no dysmetria noticed. Gait and Station: Can arise from chair without use of chairarm,  Narrow based gait, he has decreased  arm swing, no  shuffling gait, smooth turning. He could not stand up on his tiptoe, or heels, Reflexes: Deep tendon reflexes: Biceps: 2/2, Brachioradialis: 2/2, Triceps: 2/2, Pateller: 3/3, Achilles: 1/1.  Plantar responses are flexor.  Assessment and plan 80 year old gentleman presenting with idiopathic parkinsonism, severe anterocollis, he developed dysphagia after 100 units Xeomin injection in October 2014,  and right femoral fracture, right hemiarthroplasty in Nov 2014, he complains few months history of worsening low back pain, early morning bilateral calf muscle cramping, on examination, he continue have mild parkinsonian features, profound anterocollis, moderate right tilt, mild left turn,  1. 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 He and his wife has went to [place],  he wants movement disorder specialist for second opinion,I will refer him to [name].   Refill his medications, Sinemet IR 25/100 3 times a day, Sinemet CR 50/200 mg once every night, I have written Azilect 1 mg every day, if his insurance covers well, he will taper off selegiline in one week, and then start on Azilect 1 tablets daily   3. Return  to clinic 6 months.  Marcial Pacas, M.D. Ph.D.  [facility] [place], [place] [address] Phone: [phone] Fax:      [phone]

## 2014-05-25 ENCOUNTER — Telehealth: Payer: Self-pay | Admitting: Neurology

## 2014-05-25 NOTE — Telephone Encounter (Signed)
I called back.  Ivan Clark said the insurance will pay for Azilect, and they would like to proceed with getting that Rx.  She has found the paper with instruction for taper written by Dr Krista Blue.

## 2014-05-25 NOTE — Telephone Encounter (Signed)
Patient's spouse requesting instructions on how to transition from Rx carbamazepine (TEGRETOL) 100 MG chewable tablet by end of January or 1st of February to Rx rasagiline (AZILECT) 1 MG TABS tablet.  Please call and advise.

## 2014-06-13 NOTE — Telephone Encounter (Signed)
I called back.  Left message.

## 2014-06-13 NOTE — Telephone Encounter (Signed)
Patient's spouse stated she was unable to cut tablet in half as instructed for Rx rasagiline (AZILECT) 1 MG TABS tablet.  Questioning what should she do?  Please call and advise.

## 2014-07-14 ENCOUNTER — Other Ambulatory Visit: Payer: Self-pay | Admitting: Neurology

## 2014-07-18 ENCOUNTER — Encounter: Payer: Self-pay | Admitting: Neurology

## 2014-07-18 ENCOUNTER — Ambulatory Visit (INDEPENDENT_AMBULATORY_CARE_PROVIDER_SITE_OTHER): Payer: Medicare Other | Admitting: Neurology

## 2014-07-18 VITALS — BP 155/71 | HR 74 | Temp 96.7°F | Resp 20 | Ht 65.0 in | Wt 188.0 lb

## 2014-07-18 DIAGNOSIS — G20 Parkinson's disease: Secondary | ICD-10-CM | POA: Diagnosis not present

## 2014-07-18 DIAGNOSIS — Z9181 History of falling: Secondary | ICD-10-CM | POA: Diagnosis not present

## 2014-07-18 DIAGNOSIS — S72001D Fracture of unspecified part of neck of right femur, subsequent encounter for closed fracture with routine healing: Secondary | ICD-10-CM

## 2014-07-18 DIAGNOSIS — K117 Disturbances of salivary secretion: Secondary | ICD-10-CM | POA: Diagnosis not present

## 2014-07-18 DIAGNOSIS — G243 Spasmodic torticollis: Secondary | ICD-10-CM

## 2014-07-18 NOTE — Progress Notes (Signed)
Subjective:    Patient ID: Ivan Clark is a 81 y.o. male.  HPI      Dear Aliene Beams,   I saw your patient, Ivan Clark, upon your kind request in my clinic today for second opinion of his parkinsonism. The patient is accompanied by his wife today. As you know, Ivan Clark is a 81 year old right-handed gentleman with an underlying complex medical history of bladder cancer, hypertension, prostate hypertrophy, fainting spell in March 2014, fall in November 2015, with right hip fracture, status post right hemiarthroplasty who was noted to have problems with his gait, hand tremors, and orthostatic lightheadedness in 2012. He's currently on Sinemet for parkinsonism. He had a brain MRI in August 2011 which showed mild atrophy and small vessel disease, MRA brain was reported to be normal. He has been on Sinemet since 2012 and has been able to tolerate the medication with improvement in his gait and tremors reported. He developed signs and symptoms of cervical dystonia and has received botulinum toxin injections. These have helped in the past, but with the last injection, in October 2014 he developed dysphagia and needed to be admitted for observation and treatment. He had briefly needed a nasogastric tube. He has recovered well from this. He eats by mouth. He does have some occasional constipation for which she takes medication as needed. He's currently on Sinemet immediate release 1 tablet 3 times a day at breakfast lunch and dinner. He takes a CR at night, 50-200 milligrams strength. He felt he has done well with it. 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 He does not exercise very much in between. He has trouble getting out of bed. He uses a urinal at night and his wife has to assist him. Memory and mood wise he feels quite stable. Thankfully, he has not fallen recently. He has a 2 wheeled walker at the house which he does not always use. He ha brought in a cane today.  There is no report of hallucinations, or dyskinesias. Sometimes his legs feel heavy especially when he first gets up.  His Past Medical History Is Significant For: Past Medical History  Diagnosis Date  . Hypertension   . Hyperlipidemia   . Aneurysm of infrarenal abdominal aorta SUPRAILIAC  4CM PER CT 10-20-2010--  STABLE  . Bladder cancer RECURRENT  (FIRST DX  2001)    HX TURBT AND CHEMO BLADDER INSTILLATION TX'S  . Parkinson disease   . Frequency of urination   . Nocturia   . Orthostatic hypotension   . Peripheral neuropathy     His Past Surgical History Is Significant For: Past Surgical History  Procedure Laterality Date  . Tonsillectomy and adenoidectomy  1938  . Left inguinal hernia repair  1996  . Transurethral resection of bladder tumor  09-11-1999    BLADDER CANCER  . Cysto/ retrograde pyelogram/ bladder bx's  07-03-2004  . Cysto/ bladder bx/ fulgeration bladder tumor  10-09-2004  . Appendectomy  10-20-2010  . Cystoscopy with biopsy  11/28/2011    Procedure: CYSTOSCOPY WITH BIOPSY;  Surgeon: Fredricka Bonine, MD;  Location: [facility];  Service: Urology;  Laterality: N/A;  . Vasectomy    . Hip arthroplasty Right 04/09/2013    Procedure: ARTHROPLASTY BIPOLAR HIP;  Surgeon: Augustin Schooling, MD;  Location: [place];  Service: Orthopedics;  Laterality: Right;    His Family History Is Significant For: Family History  Problem Relation Age of Onset  .  Heart failure Father   . Alzheimer's disease Mother     His Social History Is Significant For: History   Social History  . Marital Status: Married    Spouse Name: Katharine Look  . Number of Children: 4  . Years of Education: college   Occupational History  .      retired   Social History Main Topics  . Smoking status: Former Smoker -- 2.00 packs/day for 50 years    Types: Cigarettes    Quit date: 05/13/1999  . Smokeless tobacco: Never Used  . Alcohol Use: No  . Drug Use: No  . Sexual Activity: Not  on file   Other Topics Concern  . None   Social History Narrative   Patient lives at home with his wife Katharine Look).   Education- college education.   Retired.   Right handed.   Caffeine- Very Little    His Allergies Are:  No Known Allergies:   His Current Medications Are:  Outpatient Encounter Prescriptions as of 07/18/2014  Medication Sig  . amLODipine (NORVASC) 10 MG tablet Take 1 tablet (10 mg total) by mouth daily. (Patient taking differently: Take 5 mg by mouth daily. )  . aspirin EC 81 MG tablet Take 1 tablet (81 mg total) by mouth daily after supper.  [name] atorvastatin (LIPITOR) 20 MG tablet Take 1 tablet (20 mg total) by mouth daily.  [name] b complex vitamins tablet Take 1 tablet by mouth daily.  . carbidopa-levodopa (SINEMET CR) 50-200 MG per tablet Take 1 tablet by mouth at bedtime.  . carbidopa-levodopa (SINEMET IR) 25-100 MG per tablet Take 1 tablet by mouth 3 (three) times daily.  . finasteride (PROSCAR) 5 MG tablet   . irbesartan (AVAPRO) 150 MG tablet Take 1 tablet (150 mg total) by mouth 2 (two) times daily. (Patient taking differently: Take 150 mg by mouth daily. )  . Multiple Vitamin (MULTIVITAMIN WITH MINERALS) TABS Take 1 tablet by mouth daily.  . polyethylene glycol (MIRALAX / GLYCOLAX) packet Take 17 g by mouth daily as needed.  . rasagiline (AZILECT) 1 MG TABS tablet Take 1 tablet (1 mg total) by mouth daily.  . tamsulosin (FLOMAX) 0.4 MG CAPS capsule Take 1 capsule (0.4 mg total) by mouth daily after supper.  . [DISCONTINUED] Cholecalciferol (VITAMIN D) 2000 UNITS CAPS Take 5,000 Units by mouth daily.   . [DISCONTINUED] Docusate Calcium (STOOL SOFTENER PO) Take 2 capsules by mouth daily.   . [DISCONTINUED] dutasteride (AVODART) 0.5 MG capsule Take 1 capsule (0.5 mg total) by mouth daily after supper.  . [DISCONTINUED] selegiline (ELDEPRYL) 5 MG capsule Take 1 capsule (5 mg total) by mouth 2 (two) times daily. One in the morning and one at noon.  :  Review of Systems:   Out of a complete 14 point review of systems, all are reviewed and negative with the exception of these symptoms as listed below:   Review of Systems  All other systems reviewed and are negative.   Objective:  Neurologic Exam  Physical Exam Physical Examination:   Filed Vitals:   07/18/14 1057  BP: 155/71  Pulse: 74  Temp: 96.7 F (35.9 C)  Resp: 20   General Examination: The patient is a very pleasant 81 y.o. male in no acute distress.  HEENT: Normocephalic, atraumatic, pupils are equal, round and reactive to light and accommodation. Funduscopic exam is normal with sharp disc margins noted. Extraocular tracking shows mild saccadic breakdown without nystagmus noted. There is limitation to upper gaze. There is  mild decrease in eye blink rate. Hearing is intact. Face is symmetric with moderate facial masking and normal facial sensation. There is no lip, neck or jaw tremor. Neck is moderately rigid with evidence of severe anterocollis. ROM is decreased in his neck. While there is overall mild mouth dryness there is some accumulation of saliva under his tongue. Tongue protrudes centrally and palate elevates symmetrically. He has no upper body dyskinesias or orofacial dyskinesias.  Chest: is clear to auscultation without wheezing, rhonchi or crackles noted.  Heart: sounds are regular and normal without murmurs, rubs or gallops noted.   Abdomen: is soft, non-tender and non-distended with normal bowel sounds appreciated on auscultation.  Extremities: There is trace pitting edema in the right ankle, otherwise he has no pitting edema.   Skin: is warm and dry with no trophic changes noted. Age-related changes are noted on the skin.   Musculoskeletal: exam reveals no obvious joint deformities, tenderness, joint swelling or erythema.  Neurologically:  Mental status: The patient is awake and alert, paying good  attention. He is able to partially provide the history. His wife provides  details. He is oriented to: person, place, time/date, situation, day of week, month of year and year. His memory, attention, language and knowledge are intact. There is no aphasia, agnosia, apraxia or anomia. There is a mild degree of bradyphrenia. Speech is moderately hypophonic with minimal dysarthria noted. Mood is congruent and affect is slightly labile. 2 times he burst out into tears.   Cranial nerves are as described above under HEENT exam. In addition, shoulder shrug is normal with equal shoulder height noted.  Motor exam: Normal bulk, and strength for age is noted. There are no dyskinesias noted.  Tone is moderately rigid on the right side with presence of cogwheeling in the right upper extremity. There is overall mild bradykinesia, no drift, no rebound, and not much in the way of resting tremor, postural or action tremor. There is minimal postural tremor in the right upper extremity only. He has moderate difficulty with finger taps, hand movements and rapid alternating patting with the right upper extremity and mild difficulty with the left upper extremity. He has mild difficulty with fine motor skills in the right lower extremity and minimal to mild difficulty with the left lower extremity. He stands up with mild difficulty and pushes himself up. His posture is moderately stooped. He walks well with his cane held on the left side. He has slight trouble turning. He has mild decrease in arm swing on the right. Balance is mildly impaired. He turns in 3 steps. Romberg is essentially negative. Sensory exam is intact to all modalities in the upper and lower extremities. Reflexes are 1+ throughout.   Assessment and Plan:   In summary, Ivan Clark is a very pleasant 81 y.o.-year old male with an underlying complex medical history of bladder cancer, hypertension, prostate hypertrophy, fainting spell in March 2014, fall in November 2015, with right hip fracture, status post right hemiarthroplasty  who was noted to have problems with his gait, hand tremors, and orthostatic lightheadedness in 2012. He was diagnosed with parkinsonism or Parkinson's disease in 2012 and has been on immediate release Sinemet since then. He is currently on Sinemet 25-100 milligrams strength one pill 3 times a day with his meals. He is also taking a CR at night. 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 He has responded to levodopa therapy. I talked  at length with the patient and his wife today. I agree with your treatment approach. I do suggest that he take his immediate release Sinemet 3 times a day namely at 8, 12 and 4 PM. He is advised to take it away from his mealtimes for better absorption and not to combine it with any protein milkshake or drink. He is advised to stay active mentally and physically and try to exercise daily, particularly in the form of walking regularly. He is commended on going to the gym twice a week. He is advised to drink more water, at least 6 glasses a day. He is discouraged from drinking sodas every day. Down the road, he may want to consider physical therapy again with him. At this juncture, I would not increase his medication and keep the CR at bedtime as is. We talked about maintaining a healthy lifestyle in general. I encouraged the patient to eat healthy, exercise daily and keep well hydrated, to keep a scheduled bedtime and wake time routine, to not skip any meals and eat healthy snacks in between meals and to have protein with every meal. In particular, I stressed the importance of regular exercise, within of course the patient's own mobility limitations.   Furthermore, he is advised to use his walker at all times because he his at fall risk and has fallen and injured himself in the past. The patient is scheduled to see you in about 3 months. I asked him to keep this appointment.   I answered all their  questions today and the patient and his wife were in agreement with the above outlined plan.  Thank you very much for allowing me to participate in the care of this nice patient. If I can be of any further assistance to you please do not hesitate to talk to me.  Sincerely,   Star Age, MD, PhD

## 2014-07-18 NOTE — Patient Instructions (Addendum)
I think your Parkinson's disease has remained fairly stable, which is reassuring. Nevertheless, as you know, this disease does progress with time. It can affect your balance, your memory, your mood, your bowel and bladder function, your posture, balance and walking. Overall you are doing fairly well but I do want to suggest a few things today:   Remember to drink plenty of fluid, eat healthy meals and do not skip any meals. Try to eat protein with a every meal and eat a healthy snack such as fruit or nuts in between meals. Try to keep a regular sleep-wake schedule and try to exercise daily, particularly in the form of walking, 15-20 minutes a day, if you can. Use your walker at all times.   Taking your medication on schedule is key.   Try to stay active physically and mentally. Engage in social activities in your community and with your family and try to keep up with current events by reading the newspaper or watching the news. Try to do word puzzles and you may like to do word puzzles and brain games on the computer such as on [email or web address]   As far as your medications are concerned, I would like to suggest that you take your current medication with the following additional changes: take the 25/100 mg sinemet at 8 AM, 12 and 4 PM and the CR at bedtime. Please try to take the medication away from you mealtimes, that is, ideally either one hour before or 2 hours after your meal to ensure optimal absorption. The medication can interfere with the protein content of your meal and trying to the protein in your food and therefore not get fully absorbed. Common side effects reported are: Nausea, vomiting, sedation, confusion, lightheadedness. Rare side effects include hallucinations, severe nausea or vomiting, diarrhea and significant drop in blood pressure especially when going from lying to standing or from sitting to standing.   I will let you follow up with Dr. Krista Blue as scheduled.   Our phone number is  [phone]. We also have an after hours call service for urgent matters and there is a physician on-call for urgent questions, that cannot wait till the next work day. For any emergencies you know to call 911 or go to the nearest emergency room.

## 2014-08-02 ENCOUNTER — Other Ambulatory Visit: Payer: Self-pay | Admitting: Neurology

## 2014-08-08 ENCOUNTER — Other Ambulatory Visit: Payer: Self-pay | Admitting: Neurology

## 2014-10-18 ENCOUNTER — Encounter: Payer: Self-pay | Admitting: Neurology

## 2014-10-18 ENCOUNTER — Ambulatory Visit (INDEPENDENT_AMBULATORY_CARE_PROVIDER_SITE_OTHER): Payer: Medicare Other | Admitting: Neurology

## 2014-10-18 ENCOUNTER — Ambulatory Visit: Payer: Self-pay | Admitting: Neurology

## 2014-10-18 VITALS — BP 191/89 | HR 71 | Ht 65.0 in | Wt 185.0 lb

## 2014-10-18 DIAGNOSIS — G243 Spasmodic torticollis: Secondary | ICD-10-CM | POA: Diagnosis not present

## 2014-10-18 DIAGNOSIS — G20 Parkinson's disease: Secondary | ICD-10-CM | POA: Diagnosis not present

## 2014-10-18 DIAGNOSIS — R1310 Dysphagia, unspecified: Secondary | ICD-10-CM | POA: Diagnosis not present

## 2014-10-18 NOTE — Progress Notes (Signed)
PATIENT: Ivan Clark DOB: [birth year]  Chief Complaint  Patient presents with  . Parkinson's Disease    He is here with his wife, Katharine Look.  He is concerned about increased difficulty swallowing.  Says he easily gets choked.  He is still eating solid foods.    HISTORICAL  Ivan Clark is 81 years old male, accompanied by his wife, initially seen in consultation by his primary care physician [name] for cervical dystonia, mild parkinsonian features.  He has past medical history of bladder cancer, hypertension, prostate hypertrophy, over the past 20 years, he also developed gradual onset of anterocollois.  Since 2012, wife noticed that he has slowed down significantly, had difficulty initiate gait, small shuffling gait, occasionally bilateral hands tremor, he also developed frequent lightheadedness, happen when he gets up quickly from sitting position, no passing out, consistent with a orthostatic blood pressure changes.  He also has chronic constipation, acting out of dreams, loss of sense of smell for a long time period. He contributed to his loss of smell to his long time smoking history, he quit smoking 11 years ago, he used to smoke 2 packs a day.  MRI of brain in August 2011: mild atrophy,small vessel disease, no acute lesions. MRA of brain was normal  On examination he has mild parkinsonian features, Sinemet 25/100 3 times a day since 2012, he tolerated the medication well, there is mild improvement in his gait, he underwent emergency appendectomy in June 2013, recovered well from his surgery. He denies memory trouble.  Selegiline since 04/2012, did very well, but had few episodes of dizziness,   Fainting spell in March 5th 2014, he got up early morning, around 5:45 AM, he was in bathroom, he felt funny after taking few steps, landed on the floor, he was taken to [name], was found to have orthostatic hypotension, was sent to the emergency room, reported normal CT  scan.  He continues to complain of lightheadedness when standing up quickly,  He began to receive EMG guided butulinum toxin injection for his anterocollis and sialorrhea since March 19th 2014, the first one was in March 2014, the second injection was in July 2nd 2014, each time, he has mild improvement in straighting up his neck better, so he can walk better.  The third injection was October 10th 2014 , the same amount of xeomin 100 units total, 50 units to sternocleidal mastoid muscles, 5 days after the injection, he began to notice difficulty swallowing, especially with liquid, and hard solid food, eventually was admitted to hospital in February 28, 2013 swallowing study has demonstrated moderate pharyngeal and cervical esophageal dysphagia.  He require NG tube for one day, dysphagia gradually resolved  He fell in April 09, 2013, with right hip fracture, he had a right hemiarthroplasty, recovered very well, is ambulating without assistance again, He is taking Sinemet IR 25/100 3 times a day, Sinemet CR 50/200 one tablet every night. He has no significant memory trouble, mild REM sleep disorder    UPDATE Apr 21 2014: He is doing good, exercise regularly with a personal  trainier twice a week, stretching his legs,  he continue have gradual worsening neck flexion forward, denies significant swallowing difficulty, has occasionally choking episode on certain texture of food, such as grapes skin, he still eats regular food, drinking water without difficulty  Over the past few months, he noticed early morning calf muscle cramping, worsening low back pain, bilateral feet numbness, no bowel and bladder incontinence.  UPDATE June 8th 2016: I have reviewed consult note from movement specialist [name] in March 2016, who concurred the diagnosis of mild Parkinson's disease, cervical dystonia, agree on current management of Sinemet immediate release 25/100 mg 3 times a day, Sinemet CR 50/200 mg once every  night  He has mild trouble swallowing, with big chunk solid food, he is still able to eat regular food, acting out of dreams, worsening anterocollis, still able to ambulate without assistance    His wife is using 24-hour oxygen now because of bronchiectasis     REVIEW OF SYSTEMS: Full 14 system review of systems performed and notable only for as above  ALLERGIES: No Known Allergies  HOME MEDICATIONS: Current Outpatient Prescriptions  Medication Sig Dispense Refill  . amLODipine (NORVASC) 10 MG tablet Take 1 tablet (10 mg total) by mouth daily. (Patient taking differently: Take 5 mg by mouth daily. ) 30 tablet 1  . aspirin EC 81 MG tablet Take 1 tablet (81 mg total) by mouth daily after supper.    [name] atorvastatin (LIPITOR) 20 MG tablet Take 1 tablet (20 mg total) by mouth daily. 30 tablet 1  . b complex vitamins tablet Take 1 tablet by mouth daily.    . carbidopa-levodopa (SINEMET CR) 50-200 MG per tablet Take 1 tablet by mouth at bedtime. 30 tablet 11  . carbidopa-levodopa (SINEMET IR) 25-100 MG per tablet Take 1 tablet by mouth 3 (three) times daily. 90 tablet 11  . Cholecalciferol (VITAMIN D-3 PO) Take 5,000 Units by mouth daily.    . finasteride (PROSCAR) 5 MG tablet   0  . irbesartan (AVAPRO) 150 MG tablet Take 1 tablet (150 mg total) by mouth 2 (two) times daily. (Patient taking differently: Take 150 mg by mouth daily. ) 60 tablet 1  . Multiple Vitamin (MULTIVITAMIN WITH MINERALS) TABS Take 1 tablet by mouth daily.    . polyethylene glycol (MIRALAX / GLYCOLAX) packet Take 17 g by mouth daily as needed. 14 each 0  . rasagiline (AZILECT) 1 MG TABS tablet Take 1 tablet (1 mg total) by mouth daily. 30 tablet 11  . tamsulosin (FLOMAX) 0.4 MG CAPS capsule Take 1 capsule (0.4 mg total) by mouth daily after supper. 30 capsule 1   No current facility-administered medications for this visit.   Facility-Administered Medications Ordered in Other Visits  Medication Dose Route Frequency  Provider Last Rate Last Dose  . mitomycin (MUTAMYCIN) chemo injection 40 mg  40 mg Bladder Instillation Once Festus Aloe, MD        PAST MEDICAL HISTORY: Past Medical History  Diagnosis Date  . Hypertension   . Hyperlipidemia   . Aneurysm of infrarenal abdominal aorta SUPRAILIAC  4CM PER CT 10-20-2010--  STABLE  . Bladder cancer RECURRENT  (FIRST DX  2001)    HX TURBT AND CHEMO BLADDER INSTILLATION TX'S  . Parkinson disease   . Frequency of urination   . Nocturia   . Orthostatic hypotension   . Peripheral neuropathy     PAST SURGICAL HISTORY: Past Surgical History  Procedure Laterality Date  . Tonsillectomy and adenoidectomy  1938  . Left inguinal hernia repair  1996  . Transurethral resection of bladder tumor  09-11-1999    BLADDER CANCER  . Cysto/ retrograde pyelogram/ bladder bx's  07-03-2004  . Cysto/ bladder bx/ fulgeration bladder tumor  10-09-2004  . Appendectomy  10-20-2010  . Cystoscopy with biopsy  11/28/2011    Procedure: CYSTOSCOPY WITH BIOPSY;  Surgeon: Fredricka Bonine, MD;  Location: [facility];  Service: Urology;  Laterality: N/A;  . Vasectomy    . Hip arthroplasty Right 04/09/2013    Procedure: ARTHROPLASTY BIPOLAR HIP;  Surgeon: Augustin Schooling, MD;  Location: [facility];  Service: Orthopedics;  Laterality: Right;    FAMILY HISTORY: Family History  Problem Relation Age of Onset  . Heart failure Father   . Alzheimer's disease Mother     SOCIAL HISTORY:  History   Social History  . Marital Status: Married    Spouse Name: Katharine Look  . Number of Children: 4  . Years of Education: college   Occupational History  .      retired   Social History Main Topics  . Smoking status: Former Smoker -- 2.00 packs/day for 50 years    Types: Cigarettes    Quit date: 05/13/1999  . Smokeless tobacco: Never Used  . Alcohol Use: No  . Drug Use: No  . Sexual Activity: Not on file   Other Topics Concern  . Not on file   Social History  Narrative   Patient lives at home with his wife Katharine Look).   Education- college education.   Retired.   Right handed.   Caffeine- Very Little     PHYSICAL EXAM   Filed Vitals:   10/18/14 1345  BP: 191/89  Pulse: 71  Height: 5\' 5"  (1.651 m)  Weight: 185 lb (83.915 kg)    Not recorded      Body mass index is 30.79 kg/(m^2).  PHYSICAL EXAMNIATION:  Gen: NAD, conversant, well nourised, obese, well groomed                     Cardiovascular: Regular rate rhythm, no peripheral edema, warm, nontender. Eyes: Conjunctivae clear without exudates or hemorrhage Neck: Supple, no carotid bruise. Pulmonary: Clear to auscultation bilaterally   NEUROLOGICAL EXAM:  MENTAL STATUS: Speech:    Speech is normal; fluent and spontaneous with normal comprehension.  Cognition:    The patient is oriented to person, place, and time;     recent and remote memory intact;     language fluent;     normal attention, concentration,     fund of knowledge.  CRANIAL NERVES: He has moderate anterocollis, moderate right tilt, mileft turn, maximum neck erection to 90 degree.  CN II: Visual fields are full to confrontation. Pupil equal round reactive to light  CN III, IV, VI: extraocular movement are normal. No ptosis. CN V: Facial sensation is intact to pinprick in all 3 divisions bilaterally. Corneal responses are intact.  CN VII: Face is symmetric with normal eye closure and smile. CN VIII: Hearing is normal to rubbing fingers CN IX, X: Palate elevates symmetrically. Phonation is normal. CN XI: Head turning and shoulder shrug are intac CN XII: Tongue is midline with normal movements and no atrophy.  MOTOR: There is no pronator drift of out-stretched arms. He has mild bilateral limb, nuchal rigidity,   REFLEXES: Reflexes are 2+ and symmetric at the biceps, triceps, knees, and ankles. Plantar responses are flexor.  SENSORY: Light touch, pinprick, position sense, and vibration sense are intact in  fingers and toes.  COORDINATION: Rapid alternating movements and fine finger movements are intact. There is no dysmetria on finger-to-nose and heel-knee-shin. There are no abnormal or extraneous movements.   GAIT/STANCE: Need to push up to get up from seated position, good stride, mildly unsteady,   DIAGNOSTIC DATA (LABS, IMAGING, TESTING) - I reviewed patient records, labs, notes,  testing and imaging myself where available.  Lab Results  Component Value Date   WBC 10.6* 04/15/2013   HGB 10.9* 04/15/2013   HCT 32.1* 04/15/2013   MCV 90.9 04/15/2013   PLT 290 04/15/2013      Component Value Date/Time   NA 137 04/15/2013 0520   K 3.5 04/15/2013 0520   CL 101 04/15/2013 0520   CO2 27 04/15/2013 0520   GLUCOSE 107* 04/15/2013 0520   BUN 20 04/15/2013 0520   CREATININE 0.90 04/21/2013 0505   CALCIUM 8.5 04/15/2013 0520   PROT 5.8* 04/15/2013 0520   ALBUMIN 2.5* 04/15/2013 0520   AST 38* 04/15/2013 0520   ALT <5 04/15/2013 0520   ALKPHOS 60 04/15/2013 0520   BILITOT 1.2 04/15/2013 0520   GFRNONAA 77* 04/21/2013 0505   GFRAA 89* 04/21/2013 0505   No results found for: CHOL, HDL, LDLCALC, LDLDIRECT, TRIG, CHOLHDL No results found for: HGBA1C No results found for: VITAMINB12 Lab Results  Component Value Date   TSH 1.241 03/01/2013    ASSESSMENT AND PLAN  CHRISHAWN BOLEY is a 81 y.o. male    1, mild parkinsonism, keep current dose of Sinemet   25/100 mg 3 times a day, Sinemet CR 50/200 mg once every night 2. Orthostatic hypotension, I advised patient to keep well hydration moderate exercise 3, worsening antercollis, he developed dysphagia with previous low dose xeomin injection, continues neck stretching exercise 4. RTC in 6 month   Marcial Pacas, M.D. Ph.D.  [facility] [address], [place], [place] [address] Ph: [phone] Fax: [phone]

## 2014-10-20 ENCOUNTER — Ambulatory Visit: Payer: Medicare Other | Admitting: Neurology

## 2014-11-06 ENCOUNTER — Other Ambulatory Visit: Payer: Self-pay

## 2014-11-07 ENCOUNTER — Telehealth: Payer: Self-pay | Admitting: Neurology

## 2014-11-07 NOTE — Telephone Encounter (Signed)
Patients wife called and states that medication rasagiline (AZILECT) 1 MG TABS tablet will cost to much per month and is requesting a different medication, selegiline 5mg  tablet will be covered by insurance. Please call and advise wife Katharine Look can be reached at [phone]. SS

## 2014-11-07 NOTE — Telephone Encounter (Signed)
Spoke to [place] - the cost of his generic Azilect has increased to $282 for a 30 day supply and may need authorization.  She said if the cost cannot be reduced, then selegiline (tablets not capsules) are covered at a lower cost.  I told her our office could check with his insurance company and get back to her with the findings.

## 2014-11-07 NOTE — Telephone Encounter (Signed)
I contacted ins and provided clinical info.  Request is currently under review Ref # [facility]

## 2014-11-08 NOTE — Telephone Encounter (Signed)
Ins will cover Azilect, however, they have denied our request for a tier reduction (lower co-pay).  Patient is unable to afford the current co-pay of $282 per month.  I called back to provide info for the [facility], [phone].  Ms Katharine Look said she will contact them and call us back if anything further is needed.

## 2014-11-08 NOTE — Telephone Encounter (Signed)
Portia with [employer] is calling to advise that medication rasagiline (AZILECT) 1 MG TABS tablet for the patient has been denied.

## 2014-11-14 ENCOUNTER — Telehealth: Payer: Self-pay | Admitting: Neurology

## 2014-11-14 NOTE — Telephone Encounter (Signed)
I called back.  Spoke with Ivan Clark.  She is aware our portion of the application has been faxed directly to the program at [phone].  She will send their portion to the program and call us back if anything further is needed.

## 2014-11-14 NOTE — Telephone Encounter (Signed)
Pts wife called and wants to know  if Dr. Krista Blue has filled out Application form for patient assistants. Please call and [phone], pts wife wants to know if it could be mailed to her.

## 2015-01-24 ENCOUNTER — Other Ambulatory Visit: Payer: Self-pay | Admitting: Neurology

## 2015-04-19 ENCOUNTER — Ambulatory Visit (INDEPENDENT_AMBULATORY_CARE_PROVIDER_SITE_OTHER): Payer: Medicare Other | Admitting: Nurse Practitioner

## 2015-04-19 ENCOUNTER — Encounter: Payer: Self-pay | Admitting: Nurse Practitioner

## 2015-04-19 VITALS — BP 158/79 | HR 84 | Ht 65.0 in | Wt 181.8 lb

## 2015-04-19 DIAGNOSIS — G218 Other secondary parkinsonism: Secondary | ICD-10-CM | POA: Diagnosis not present

## 2015-04-19 DIAGNOSIS — G243 Spasmodic torticollis: Secondary | ICD-10-CM

## 2015-04-19 DIAGNOSIS — K117 Disturbances of salivary secretion: Secondary | ICD-10-CM | POA: Diagnosis not present

## 2015-04-19 DIAGNOSIS — R1313 Dysphagia, pharyngeal phase: Secondary | ICD-10-CM | POA: Diagnosis not present

## 2015-04-19 MED ORDER — CARBIDOPA-LEVODOPA 25-100 MG PO TABS: 1.0000 | ORAL_TABLET | Freq: Three times a day (TID) | ORAL | Status: DC

## 2015-04-19 MED ORDER — CARBIDOPA-LEVODOPA ER 50-200 MG PO TBCR: 1.0000 | EXTENDED_RELEASE_TABLET | Freq: Every day | ORAL | Status: DC

## 2015-04-19 NOTE — Progress Notes (Signed)
I have reviewed and agreed above plan. 

## 2015-04-19 NOTE — Progress Notes (Signed)
[facility]  PATIENT: Ivan Clark DOB: 15-Apr-1929   REASON FOR VISIT: Follow-up for Parkinson's disease, dysphagia, cervical dystonia HISTORY FROM: Patient and son in law    HISTORY OF PRESENT ILLNESS:Ivan Clark is 81 years old male, accompanied by his wife, initially seen in consultation by his primary care physician Dr. Melinda Clark for cervical dystonia, mild parkinsonian features. He has past medical history of bladder cancer, hypertension, prostate hypertrophy, over the past 20 years, he also developed gradual onset of anterocollois. Since 2012, wife noticed that he has slowed down significantly, had difficulty initiate gait, small shuffling gait, occasionally bilateral hands tremor, he also developed frequent lightheadedness, happen when he gets up quickly from sitting position, no passing out, consistent with a orthostatic blood pressure changes. He also has chronic constipation, acting out of dreams, loss of sense of smell for a long time period. He contributed to his loss of smell to his long time smoking history, he quit smoking 11 years ago, he used to smoke 2 packs a day. MRI of brain in August 2011: mild atrophy,small vessel disease, no acute lesions. MRA of brain was normal On examination he has mild parkinsonian features, Sinemet 25/100 3 times a day since 2012, he tolerated the medication well, there is mild improvement in his gait, he underwent emergency appendectomy in June 2013, recovered well from his surgery. He denies memory trouble. Selegiline since 04/2012, did very well, but had few episodes of dizziness,   Fainting spell in March 5th 2014, he got up early morning, around 5:45 AM, he was in bathroom, he felt funny after taking few steps, landed on the floor, he was taken to Dr. Harrington Clark, was found to have orthostatic hypotension, was sent to the emergency room, reported normal CT scan. He continues to complain of lightheadedness when standing  up quickly,  He began to receive EMG guided butulinum toxin injection for his anterocollis and sialorrhea since March 19th 2014, the first one was in March 2014, the second injection was in July 2nd 2014, each time, he has mild improvement in straighting up his neck better, so he can walk better.  The third injection was October 10th 2014 , the same amount of xeomin 100 units total, 50 units to sternocleidal mastoid muscles, 5 days after the injection, he began to notice difficulty swallowing, especially with liquid, and hard solid food, eventually was admitted to hospital in February 28, 2013 swallowing study has demonstrated moderate pharyngeal and cervical esophageal dysphagia. He require NG tube for one day, dysphagia gradually resolved  He fell in April 09, 2013, with right hip fracture, he had a right hemiarthroplasty, recovered very well, is ambulating without assistance again, He is taking Sinemet IR 25/100 3 times a day, Sinemet CR 50/200 one tablet every night. He has no significant memory trouble, mild REM sleep disorder  UPDATE Apr 21 2014: He is doing good, exercise regularly with a personal trainier twice a week, stretching his legs, he continue have gradual worsening neck flexion forward, denies significant swallowing difficulty, has occasionally choking episode on certain texture of food, such as grapes skin, he still eats regular food, drinking water without difficulty Over the past few months, he noticed early morning calf muscle cramping, worsening low back pain, bilateral feet numbness, no bowel and bladder incontinence. UPDATE June 8th 2016:I have reviewed consult note from movement specialist Dr. Rexene Clark in March 2016, who concurred the diagnosis of mild Parkinson's disease, cervical dystonia, agree on current management of Sinemet  immediate release 25/100 mg 3 times a day, Sinemet CR 50/200 mg once every night He has mild trouble swallowing, with big chunk solid food, he is  still able to eat regular food, acting out of dreams, worsening anterocollis, still able to ambulate without assistance  His wife is using 24-hour oxygen now because of bronchiectasis  UPDATE 04/19/2015 Ivan Clark, 81 year old male returns for follow-up. He has a history of mild Parkinson's disease and cervical dystonia and mild dysphagia. He can chock on certain foods such as grapes and he tries to avoid those. He has no problems with liquids. He denies any recent falls, he uses a cane for ambulation. He does some stretching exercises. He does not perform any neck exercises. He reports that his memory is good, he is currently on Sinemet extended release at night and regular release 3 times a day before meals, he is also on Azilect. He returns for reevaluation.    REVIEW OF SYSTEMS: Full 14 system review of systems performed and notable only for those listed, all others are neg:  Constitutional: neg  Cardiovascular: neg Ear/Nose/Throat: Trouble swallowing at times Skin: neg Eyes: neg Respiratory: neg Gastroitestinal: neg  Hematology/Lymphatic: Easy bruising Endocrine: neg Musculoskeletal:neg Allergy/Immunology: neg Neurological: Weakness Psychiatric: neg Sleep : neg   ALLERGIES: No Known Allergies  HOME MEDICATIONS: Outpatient Prescriptions Prior to Visit  Medication Sig Dispense Refill  . amLODipine (NORVASC) 10 MG tablet Take 1 tablet (10 mg total) by mouth daily. (Patient taking differently: Take 5 mg by mouth daily. ) 30 tablet 1  . aspirin EC 81 MG tablet Take 1 tablet (81 mg total) by mouth daily after supper.    [name] atorvastatin (LIPITOR) 20 MG tablet Take 1 tablet (20 mg total) by mouth daily. 30 tablet 1  . b complex vitamins tablet Take 1 tablet by mouth daily.    . carbidopa-levodopa (SINEMET CR) 50-200 MG per tablet Take 1 tablet by mouth at bedtime. 30 tablet 11  . carbidopa-levodopa (SINEMET IR) 25-100 MG per tablet Take 1 tablet by mouth 3 (three) times daily.  90 tablet 11  . carbidopa-levodopa (SINEMET IR) 25-100 MG per tablet TAKE 1 TABLET BY MOUTH 3 TIMES A DAY 270 tablet 1  . Cholecalciferol (VITAMIN D-3 PO) Take 5,000 Units by mouth daily.    . finasteride (PROSCAR) 5 MG tablet   0  . irbesartan (AVAPRO) 150 MG tablet Take 1 tablet (150 mg total) by mouth 2 (two) times daily. (Patient taking differently: Take 150 mg by mouth daily. ) 60 tablet 1  . Multiple Vitamin (MULTIVITAMIN WITH MINERALS) TABS Take 1 tablet by mouth daily.    . polyethylene glycol (MIRALAX / GLYCOLAX) packet Take 17 g by mouth daily as needed. 14 each 0  . rasagiline (AZILECT) 1 MG TABS tablet Take 1 tablet (1 mg total) by mouth daily. 30 tablet 11  . tamsulosin (FLOMAX) 0.4 MG CAPS capsule Take 1 capsule (0.4 mg total) by mouth daily after supper. 30 capsule 1   Facility-Administered Medications Prior to Visit  Medication Dose Route Frequency Provider Last Rate Last Dose  . mitomycin (MUTAMYCIN) chemo injection 40 mg  40 mg Bladder Instillation Once Festus Aloe, MD        PAST MEDICAL HISTORY: Past Medical History  Diagnosis Date  . Hypertension   . Hyperlipidemia   . Aneurysm of infrarenal abdominal aorta (HCC) SUPRAILIAC  4CM PER CT 10-20-2010--  STABLE  . Bladder cancer ([place]) RECURRENT  (FIRST DX  2001)  HX TURBT AND CHEMO BLADDER INSTILLATION TX'S  . Parkinson disease ([place])   . Frequency of urination   . Nocturia   . Orthostatic hypotension   . Peripheral neuropathy ([place])     PAST SURGICAL HISTORY: Past Surgical History  Procedure Laterality Date  . Tonsillectomy and adenoidectomy  1938  . Left inguinal hernia repair  1996  . Transurethral resection of bladder tumor  09-11-1999    BLADDER CANCER  . Cysto/ retrograde pyelogram/ bladder bx's  07-03-2004  . Cysto/ bladder bx/ fulgeration bladder tumor  10-09-2004  . Appendectomy  10-20-2010  . Cystoscopy with biopsy  11/28/2011    Procedure: CYSTOSCOPY WITH BIOPSY;  Surgeon: Fredricka Bonine, MD;  Location: [facility];  Service: Urology;  Laterality: N/A;  . Vasectomy    . Hip arthroplasty Right 04/09/2013    Procedure: ARTHROPLASTY BIPOLAR HIP;  Surgeon: Augustin Schooling, MD;  Location: [place];  Service: Orthopedics;  Laterality: Right;    FAMILY HISTORY: Family History  Problem Relation Age of Onset  . Heart failure Father   . Alzheimer's disease Mother     SOCIAL HISTORY: Social History   Social History  . Marital Status: Married    Spouse Name: Katharine Look  . Number of Children: 4  . Years of Education: college   Occupational History  .      retired   Social History Main Topics  . Smoking status: Former Smoker -- 2.00 packs/day for 50 years    Types: Cigarettes    Quit date: 05/13/1999  . Smokeless tobacco: Never Used  . Alcohol Use: No  . Drug Use: No  . Sexual Activity: Not on file   Other Topics Concern  . Not on file   Social History Narrative   Patient lives at home with his wife Katharine Look).   Education- college education.   Retired.   Right handed.   Caffeine- Very Little     PHYSICAL EXAM  Filed Vitals:   04/19/15 1259  Height: 5\' 5"  (1.651 m)   There is no weight on file to calculate BMI.  Generalized: Well developed, in no acute distress, well groomed  Head: normocephalic and atraumatic,. Oropharynx benign  Neck: He has moderate aterocollis,  Cardiac: Regular rate rhythm, no murmur  Musculoskeletal: No deformity   Neurological examination   Mentation: Alert oriented to time, place, history taking. Attention span and concentration appropriate. Recent and remote memory intact.  Follows all commands speech and language fluent.   Cranial nerve II-XII: Pupils were equal round reactive to light extraocular movements were full, visual field were full on confrontational test. Facial sensation and strength were normal. hearing was intact to finger rubbing bilaterally. Uvula tongue midline. head turning and shoulder  shrug were normal and symmetric.Tongue protrusion into cheek strength was normal. Motor: normal bulk and tone, full strength in the BUE, BLE, fine finger movements normal, no pronator drift. He has mild bilateral limb and nuchal rigidity  Coordination: finger-nose-finger, heel-to-shin bilaterally, no dysmetria Reflexes: Brachioradialis 2/2, biceps 2/2, triceps 2/2, patellar 2/2, Achilles 2/2, plantar responses were flexor bilaterally. Gait and Station: Rising up from seated position with pushoff , good stride, mildly unsteady gait ambulates with single-point cane no difficulty with turns   DIAGNOSTIC DATA (LABS, IMAGING, TESTING) -  ASSESSMENT AND PLAN  81 y.o. year old male  has a past medical history of  Parkinson disease (HCC);orthostatic hypotension; and Peripheral neuropathy ([place]). Cervical dystonia here to follow-up     Continue  Sinemet 25/ 100 3 times a day and Sinemet CR 50/200 at night will refill Stay well-hydrated for orthostatic hypotension Given a list of neck exercises to perform for his dystonia For his dysphagia double  swallow after each mouthful Continue Azilect at current dose, according to patient it  will be generic January 1 Follow-up in 6 months Dennie Bible, [facility], [facility], [facility] [address], [place], [place] [address] [phone]

## 2015-04-19 NOTE — Patient Instructions (Addendum)
Continue Sinemet 25/ 100 3 times a day and Sinemet CR 50/200 at night Stay well-hydrated Continue Azilect at current dose F/U in 6 months

## 2015-04-30 ENCOUNTER — Other Ambulatory Visit: Payer: Self-pay | Admitting: Neurology

## 2015-05-17 ENCOUNTER — Other Ambulatory Visit: Payer: Self-pay | Admitting: Nurse Practitioner

## 2015-05-17 MED ORDER — RASAGILINE MESYLATE 1 MG PO TABS: 1.0000 mg | ORAL_TABLET | Freq: Every day | ORAL | Status: DC

## 2015-06-14 DIAGNOSIS — R296 Repeated falls: Secondary | ICD-10-CM | POA: Diagnosis not present

## 2015-06-14 DIAGNOSIS — I129 Hypertensive chronic kidney disease with stage 1 through stage 4 chronic kidney disease, or unspecified chronic kidney disease: Secondary | ICD-10-CM | POA: Diagnosis not present

## 2015-06-14 DIAGNOSIS — G20 Parkinson's disease: Secondary | ICD-10-CM | POA: Diagnosis not present

## 2015-06-14 DIAGNOSIS — C679 Malignant neoplasm of bladder, unspecified: Secondary | ICD-10-CM | POA: Diagnosis not present

## 2015-06-14 DIAGNOSIS — N182 Chronic kidney disease, stage 2 (mild): Secondary | ICD-10-CM | POA: Diagnosis not present

## 2015-06-14 DIAGNOSIS — G609 Hereditary and idiopathic neuropathy, unspecified: Secondary | ICD-10-CM | POA: Diagnosis not present

## 2015-07-06 DIAGNOSIS — N400 Enlarged prostate without lower urinary tract symptoms: Secondary | ICD-10-CM | POA: Diagnosis not present

## 2015-07-06 DIAGNOSIS — I10 Essential (primary) hypertension: Secondary | ICD-10-CM | POA: Diagnosis not present

## 2015-07-06 DIAGNOSIS — E782 Mixed hyperlipidemia: Secondary | ICD-10-CM | POA: Diagnosis not present

## 2015-07-06 DIAGNOSIS — G20 Parkinson's disease: Secondary | ICD-10-CM | POA: Diagnosis not present

## 2015-07-06 DIAGNOSIS — M40209 Unspecified kyphosis, site unspecified: Secondary | ICD-10-CM | POA: Diagnosis not present

## 2015-07-06 DIAGNOSIS — M24569 Contracture, unspecified knee: Secondary | ICD-10-CM | POA: Diagnosis not present

## 2015-07-12 DIAGNOSIS — M25551 Pain in right hip: Secondary | ICD-10-CM | POA: Diagnosis not present

## 2015-07-12 DIAGNOSIS — M25571 Pain in right ankle and joints of right foot: Secondary | ICD-10-CM | POA: Diagnosis not present

## 2015-08-23 DIAGNOSIS — Z4789 Encounter for other orthopedic aftercare: Secondary | ICD-10-CM | POA: Diagnosis not present

## 2015-08-23 DIAGNOSIS — M5416 Radiculopathy, lumbar region: Secondary | ICD-10-CM | POA: Diagnosis not present

## 2015-08-23 DIAGNOSIS — M25551 Pain in right hip: Secondary | ICD-10-CM | POA: Diagnosis not present

## 2015-09-01 ENCOUNTER — Emergency Department: Payer: Medicare Other

## 2015-09-01 ENCOUNTER — Emergency Department
Admission: EM | Admit: 2015-09-01 | Discharge: 2015-09-01 | Disposition: A | Discharge status: Home / Self Care | Payer: Medicare Other | Attending: Emergency Medicine | Admitting: Emergency Medicine

## 2015-09-01 DIAGNOSIS — Z7982 Long term (current) use of aspirin: Secondary | ICD-10-CM | POA: Insufficient documentation

## 2015-09-01 DIAGNOSIS — M7989 Other specified soft tissue disorders: Secondary | ICD-10-CM | POA: Diagnosis not present

## 2015-09-01 DIAGNOSIS — S79911A Unspecified injury of right hip, initial encounter: Secondary | ICD-10-CM | POA: Diagnosis not present

## 2015-09-01 DIAGNOSIS — M79651 Pain in right thigh: Secondary | ICD-10-CM | POA: Diagnosis not present

## 2015-09-01 DIAGNOSIS — M79604 Pain in right leg: Secondary | ICD-10-CM

## 2015-09-01 DIAGNOSIS — M79671 Pain in right foot: Secondary | ICD-10-CM | POA: Diagnosis not present

## 2015-09-01 DIAGNOSIS — Z79899 Other long term (current) drug therapy: Secondary | ICD-10-CM | POA: Diagnosis not present

## 2015-09-01 DIAGNOSIS — I10 Essential (primary) hypertension: Secondary | ICD-10-CM | POA: Insufficient documentation

## 2015-09-01 DIAGNOSIS — Z87891 Personal history of nicotine dependence: Secondary | ICD-10-CM | POA: Insufficient documentation

## 2015-09-01 DIAGNOSIS — M25551 Pain in right hip: Secondary | ICD-10-CM | POA: Diagnosis not present

## 2015-09-01 MED ORDER — TRAMADOL HCL 50 MG PO TABS: 50.0000 mg | ORAL_TABLET | ORAL | Status: DC

## 2015-09-01 MED ORDER — TRAMADOL HCL 50 MG PO TABS
50.0000 mg | ORAL_TABLET | ORAL | Status: AC
  Administered 2015-09-01: 50 mg via ORAL
  Filled 2015-09-01: qty 1

## 2015-09-01 MED ORDER — HYDROCODONE-ACETAMINOPHEN 5-325 MG PO TABS: 1.0000 | ORAL_TABLET | Freq: Four times a day (QID) | ORAL | Status: DC | PRN

## 2015-09-01 NOTE — ED Provider Notes (Signed)
Pain control. X-rays negative, CT scan unremarkable except for a very small hernia containing a small amount of colon without any evidence of incarceration or obstruction. Ultrasound of the lower extremity is also negative for DVT. There is no current identified cause of the symptoms. He has an MRI scheduled in the next few days and follow-up with orthopedics. We'll discharge home with pain medication as planned by Dr. Jacqualine Code and have him follow-up with his doctors to continue evaluation.  Carrie Mew, MD 09/01/15 1700

## 2015-09-01 NOTE — Discharge Instructions (Signed)
Your xrays, CT scan of the hip, and ultrasound of the leg did not show any acute problems to explain your symptoms.  Please follow up with your primary care doctor and orthopedic doctor.  Take pain medication as needed.    The pain medication is potentially sedating. Do not drink alcohol, drive or participate in any other potentially dangerous activities while taking this medication as it may make you sleepy. Do not take this medication with any other sedating medications, either prescription or over-the-counter. If you were prescribed Percocet or Vicodin, do not take these with acetaminophen (Tylenol) as it is already contained within these medications.   Opioid pain medications (or "narcotics") can be habit forming.  Use it as little as possible to achieve adequate pain control.  Do not use or use it with extreme caution if you have a history of opiate abuse or dependence.  If you are on a pain contract with your primary care doctor or a pain specialist, be sure to let them know you were prescribed this medication today from the [facility] Emergency Department.  This medication is intended for your use only - do not give any to anyone else and keep it in a secure place where nobody else, especially children and pets, have access to it.  It will also cause or worsen constipation, so you may want to consider taking an over-the-counter stool softener while you are taking this medication.   Musculoskeletal Pain Musculoskeletal pain is muscle and boney aches and pains. These pains can occur in any part of the body. Your caregiver may treat you without knowing the cause of the pain. They may treat you if blood or urine tests, X-rays, and other tests were normal.  CAUSES There is often not a definite cause or reason for these pains. These pains may be caused by a type of germ (virus). The discomfort may also come from overuse. Overuse includes working out too hard when your body is not fit. Boney aches  also come from weather changes. Bone is sensitive to atmospheric pressure changes. HOME CARE INSTRUCTIONS   Ask when your test results will be ready. Make sure you get your test results.  Only take over-the-counter or prescription medicines for pain, discomfort, or fever as directed by your caregiver. If you were given medications for your condition, do not drive, operate machinery or power tools, or sign legal documents for 24 hours. Do not drink alcohol. Do not take sleeping pills or other medications that may interfere with treatment.  Continue all activities unless the activities cause more pain. When the pain lessens, slowly resume normal activities. Gradually increase the intensity and duration of the activities or exercise.  During periods of severe pain, bed rest may be helpful. Lay or sit in any position that is comfortable.  Putting ice on the injured area.  Put ice in a bag.  Place a towel between your skin and the bag.  Leave the ice on for 15 to 20 minutes, 3 to 4 times a day.  Follow up with your caregiver for continued problems and no reason can be found for the pain. If the pain becomes worse or does not go away, it may be necessary to repeat tests or do additional testing. Your caregiver may need to look further for a possible cause. SEEK IMMEDIATE MEDICAL CARE IF:  You have pain that is getting worse and is not relieved by medications.  You develop chest pain that is associated with shortness or  breath, sweating, feeling sick to your stomach (nauseous), or throw up (vomit).  Your pain becomes localized to the abdomen.  You develop any new symptoms that seem different or that concern you. MAKE SURE YOU:   Understand these instructions.  Will watch your condition.  Will get help right away if you are not doing well or get worse.   This information is not intended to replace advice given to you by your health care provider. Make sure you discuss any questions you  have with your health care provider.   Document Released: 04/28/2005 Document Revised: 07/21/2011 Document Reviewed: 12/31/2012 Elsevier Interactive Patient Education [employer].

## 2015-09-01 NOTE — ED Notes (Signed)
Per EMS patient comes from home place of [place] with c/o right hip pain x1 month. Patient has an appointment on Tuesday for an MRI but family states he "couldn't wait" because he is in pain. Patient denies pain at this time. Patient denies pain at this time.

## 2015-09-01 NOTE — ED Provider Notes (Signed)
[facility] Emergency Department Provider Note  ____________________________________________  Time seen: Approximately 2:28 PM  I have reviewed the triage vital signs and the nursing notes.   HISTORY  Chief Complaint Hip Pain    HPI Ivan Clark is a 82 y.o. male  evaluation of pain in his right foot as well as pain in his right hip area reports he's beenhaving fairly severe pain when he tries to stand or walk over the arch of the right foot for about a month now, also having a fair amount of pain in the right hip for about a month now. He went to the clinic and had x-rays March 3 and was told there is no fracture is been taking Tylenol for pain at home.  Because of ongoing pain he has an MRI scheduled on Tuesday through [facility]. He reports that today she is having so much pain that makes it hard for him to walk, and he needs better pain control is been progressively worsening over the last month.  No fevers chills redness. He has some swelling over the bridge of the right foot.  No fever or chills. No redness.  Some chronic numbness in the toes of both feet, unchanged. Denies any back pain. No changes in his bowels or bladder, he does strain to urinate but this been a long process for him which he attributed to have a large prostate. He is able to empty the bladder.  Past Medical History  Diagnosis Date  . Hypertension   . Hyperlipidemia   . Aneurysm of infrarenal abdominal aorta (HCC) SUPRAILIAC  4CM PER CT 10-20-2010--  STABLE  . Bladder cancer ([place]) RECURRENT  (FIRST DX  2001)    HX TURBT AND CHEMO BLADDER INSTILLATION TX'S  . Parkinson disease ([name])   . Frequency of urination   . Nocturia   . Orthostatic hypotension   . Peripheral neuropathy [facility])     Patient Active Problem List   Diagnosis Date Noted  . Fracture of femoral neck, right ([place]) 04/14/2013  . Right femoral fracture ([place]) 04/09/2013  . Hypertension 04/09/2013   . Hyperlipidemia 04/09/2013  . BPH (benign prostatic hypertrophy) 04/09/2013  . Closed right hip fracture ([place]) 04/09/2013  . Hip fracture, right ([place]) 04/09/2013  . Femoral neck fracture ([place]) 04/09/2013  . Dysphagia, pharyngeal 03/02/2013  . Cervical dystonia 07/28/2012  . Sialorrhea 07/28/2012  . Personal history of malignant neoplasm of bladder 07/27/2012  . Essential hypertension 07/27/2012  . Dizziness and giddiness 07/27/2012  . Parkinsonism ([place]) 07/27/2012    Past Surgical History  Procedure Laterality Date  . Tonsillectomy and adenoidectomy  1938  . Left inguinal hernia repair  1996  . Transurethral resection of bladder tumor  09-11-1999    BLADDER CANCER  . Cysto/ retrograde pyelogram/ bladder bx's  07-03-2004  . Cysto/ bladder bx/ fulgeration bladder tumor  10-09-2004  . Appendectomy  10-20-2010  . Cystoscopy with biopsy  11/28/2011    Procedure: CYSTOSCOPY WITH BIOPSY;  Surgeon: Fredricka Bonine, MD;  Location: [facility];  Service: Urology;  Laterality: N/A;  . Vasectomy    . Hip arthroplasty Right 04/09/2013    Procedure: ARTHROPLASTY BIPOLAR HIP;  Surgeon: Augustin Schooling, MD;  Location: [place];  Service: Orthopedics;  Laterality: Right;    Current Outpatient Rx  Name  Route  Sig  Dispense  Refill  . amLODipine (NORVASC) 10 MG tablet   Oral   Take 1 tablet (10 mg total) by  mouth daily. Patient taking differently: Take 5 mg by mouth daily.    30 tablet   1   . aspirin EC 81 MG tablet   Oral   Take 1 tablet (81 mg total) by mouth daily after supper.         [name] atorvastatin (LIPITOR) 20 MG tablet   Oral   Take 1 tablet (20 mg total) by mouth daily.   30 tablet   1   . b complex vitamins tablet   Oral   Take 1 tablet by mouth daily.         . carbidopa-levodopa (SINEMET CR) 50-200 MG tablet   Oral   Take 1 tablet by mouth at bedtime.   30 tablet   11   . carbidopa-levodopa (SINEMET IR) 25-100 MG per tablet   Oral    Take 1 tablet by mouth 3 (three) times daily.   90 tablet   11   . carbidopa-levodopa (SINEMET IR) 25-100 MG tablet   Oral   Take 1 tablet by mouth 3 (three) times daily.   270 tablet   3   . carbidopa-levodopa (SINEMET IR) 25-100 MG tablet      TAKE 1 TABLET BY MOUTH 3 TIMES A DAY   270 tablet   3   . Cholecalciferol (VITAMIN D-3 PO)   Oral   Take 5,000 Units by mouth daily.         . finasteride (PROSCAR) 5 MG tablet            0   . HYDROcodone-acetaminophen (NORCO/VICODIN) 5-325 MG tablet   Oral   Take 1 tablet by mouth every 6 (six) hours as needed for moderate pain.   15 tablet   0   . irbesartan (AVAPRO) 150 MG tablet   Oral   Take 1 tablet (150 mg total) by mouth 2 (two) times daily. Patient taking differently: Take 150 mg by mouth daily.    60 tablet   1   . Multiple Vitamin (MULTIVITAMIN WITH MINERALS) TABS   Oral   Take 1 tablet by mouth daily.         . polyethylene glycol (MIRALAX / GLYCOLAX) packet   Oral   Take 17 g by mouth daily as needed.   14 each   0   . rasagiline (AZILECT) 1 MG TABS tablet   Oral   Take 1 tablet (1 mg total) by mouth daily.   30 tablet   11   . tamsulosin (FLOMAX) 0.4 MG CAPS capsule   Oral   Take 1 capsule (0.4 mg total) by mouth daily after supper.   30 capsule   1     Allergies Review of patient's allergies indicates no known allergies.  Family History  Problem Relation Age of Onset  . Heart failure Father   . Alzheimer's disease Mother     Social History Social History  Substance Use Topics  . Smoking status: Former Smoker -- 2.00 packs/day for 50 years    Types: Cigarettes    Quit date: 05/13/1999  . Smokeless tobacco: Never Used  . Alcohol Use: No    Review of Systems Constitutional: No fever/chills Eyes: No visual changes. ENT: No sore throat. Cardiovascular: Denies chest pain. Respiratory: Denies shortness of breath. Gastrointestinal: No abdominal pain.  No nausea, no vomiting.   No diarrhea.  No constipation. Genitourinary: Negative for dysuria. Straining to urinate often, normal for him. Musculoskeletal: Negative for back pain. Skin: Negative for rash.  Neurological: Negative for headaches, focal weakness or numbness.  10-point ROS otherwise negative.  ____________________________________________   PHYSICAL EXAM:  VITAL SIGNS: ED Triage Vitals  Enc Vitals Group     BP 09/01/15 1345 197/103 mmHg     Pulse Rate 09/01/15 1345 77     Resp 09/01/15 1345 17     Temp 09/01/15 1345 97.8 F (36.6 C)     Temp src --      SpO2 09/01/15 1345 95 %     Weight 09/01/15 1345 170 lb (77.111 kg)     Height 09/01/15 1345 5\' 8"  (1.727 m)     Head Cir --      Peak Flow --      Pain Score --      Pain Loc --      Pain Edu? --      Excl. in [place]? --    Constitutional: Alert and oriented. Well appearing and in no acute distress.Parkinsonian face and tremor. Did last take his Sinemet at noon to it 4 PM. Eyes: Conjunctivae are normal. PERRL. EOMI. Head: Atraumatic. Nose: No congestion/rhinnorhea. Mouth/Throat: Mucous membranes are moist.  Oropharynx non-erythematous. Neck: No stridor.   Cardiovascular: Normal rate, regular rhythm. Grossly normal heart sounds.  Strong dopplerable signals in the right dorsalis pedis and posterior tibial bilateral. Normal capillary refill throughout the foot. Respiratory: Normal respiratory effort.  No retractions. Lungs CTAB. Gastrointestinal: Soft and nontender. No distention.  Musculoskeletal:   Lower Extremities  No edema. Normal DP/PT pulses bilateral with good cap refill.  Normal neuro-motor function lower extremities bilateral.  RIGHT Right lower extremity demonstrates normal strength, good use of all muscles. No edema bruising or contusions of the right hip, right knee, right ankle. Full range of motion of the right lower extremity without pain except for tenderness across the bridge of the right foot. No pain on axial loading.  No evidence of trauma. Patient does have some mild edema overlying the metatarsals, no erythema, he does have tenderness across the top of the foot. There is no evidence of infection, no joint effusion noted. He does have a slight inversion of the left foot, reports this is typical for him and he walks with a somewhat abnormal gait due to his Parkinson's and neuropathy.  LEFT Left lower extremity demonstrates normal strength, good use of all muscles. No edema bruising or contusions of the hip,  knee, ankle. Full range of motion of the left lower extremity without pain. No pain on axial loading. No evidence of trauma.   Neurologic:  Normal speech and language. No gross focal neurologic deficits are appreciated. No gait instability. Skin:  Skin is warm, dry and intact. No rash noted. Psychiatric: Mood and affect are normal. Speech and behavior are normal.  ____________________________________________   LABS (all labs ordered are listed, but only abnormal results are displayed)  Labs Reviewed - No data to display ____________________________________________  EKG   ____________________________________________  RADIOLOGY  CT Hip Right Wo Contrast (Final result) Result time: 09/01/15 15:06:24   Final result by Rad Results In Interface (09/01/15 15:06:24)   Narrative:   CLINICAL DATA: Status post fall 3 days ago with worsening right hip pain. History of prior right hip replacement. Initial encounter.  EXAM: CT OF THE RIGHT HIP WITHOUT CONTRAST  TECHNIQUE: Multidetector CT imaging of the right hip was performed according to the standard protocol. Multiplanar CT image reconstructions were also generated.  COMPARISON: Plain films of the right femur earlier today.  FINDINGS: Right  hip hemiarthroplasty is in place. The device is located. There is no evidence of loosening or other hardware complication. There is no fracture. Imaged musculature appears normal. No fluid  collection or mass identified. Imaged intrapelvic contents demonstrate a right inguinal hernia containing a knuckle of colon.  IMPRESSION: No acute abnormality. Right hip arthroplasty in place without evidence of complication.  Right inguinal hernia contains a knuckle of colon.   Electronically Signed By: Inge Rise M.D. On: 09/01/2015 15:06          DG FEMUR, MIN 2 VIEWS RIGHT (Final result) Result time: 09/01/15 14:45:31   Final result by Rad Results In Interface (09/01/15 14:45:31)   Narrative:   CLINICAL DATA: Right hip and right lower extremity pain. Recent fall.  EXAM: RIGHT FEMUR 2 VIEWS  COMPARISON: 04/09/2013 right hip radiographs.  FINDINGS: Status post right hip hemiarthroplasty, with well-positioned right proximal femoral prosthesis, with no evidence of prosthetic fracture or loosening. No osseous fracture or focal osseous lesion. No malalignment at the right hip or right knee on the provided views.  IMPRESSION: Right hip hemiarthroplasty, with no hardware complication. No osseous fracture in the right femur.   Electronically Signed By: Ilona Sorrel M.D. On: 09/01/2015 14:45          DG Tibia/Fibula Right (Final result) Result time: 09/01/15 14:48:08   Final result by Rad Results In Interface (09/01/15 14:48:08)   Narrative:   CLINICAL DATA: Right lower extremity pain. Recent fall.  EXAM: RIGHT TIBIA AND FIBULA - 2 VIEW  COMPARISON: None.  FINDINGS: Mild diffuse soft tissue swelling. No fracture. No focal bone lesions. No evidence of malalignment at the right knee or right ankle on the provided views. No right knee joint effusion.  IMPRESSION: Mild diffuse soft tissue swelling. No fracture.   Electronically Signed By: Ilona Sorrel M.D. On: 09/01/2015 14:48          DG Foot Complete Right (Final result) Result time: 09/01/15 14:48:54   Final result by Rad Results In Interface (09/01/15 14:48:54)    Narrative:   CLINICAL DATA: No known injury at this time. Previous falls in the past- pt has Parkinson's. pain all down his right leg and foot, mostly in hip X months. Pt has had a right hip surgery. Best images obtained-pt unable to move a lot of images.  EXAM: RIGHT FOOT COMPLETE - 3+ VIEW  COMPARISON: None.  FINDINGS: There is no evidence of fracture or dislocation. There is no evidence of arthropathy or other focal bone abnormality. Soft tissues are unremarkable.  IMPRESSION: Negative.   Electronically Signed By: Nolon Nations M.D. On: 09/01/2015 14:48       ____________________________________________   PROCEDURES  Procedure(s) performed: None  Critical Care performed: No  ____________________________________________   INITIAL IMPRESSION / ASSESSMENT AND PLAN / ED COURSE  Pertinent labs & imaging results that were available during my care of the patient were reviewed by me and considered in my medical decision making (see chart for details).  Patient presents for evaluation of increasing pain in the right foot and hip area. Scrotal exam really does not show any evidence of acute vascular abnormality. There is focal tenderness and mild edema without erythema or warmth overlying the mid foot which she reports is been slightly increasing over the last months. No evidence of infection and no systemic symptoms. He shows good range of motion of the right hip without significant tenderness. No pain to palpation of the lower back or lumbar spine. He does have  mild stocking like sensory loss over the toes, but this is normal for him and unchanged.  No abdominal pain, no back pain, no signs of symptoms of dissection or acute arterial abnormality. No infectious symptoms or evidence of infection by clinical history or exam.  I will obtain x-rays of the right lower extremity, and also given lower sensitivity for x-ray regarding hip injury I will obtain CT  without contrast. He has an MRI planned and orthopedic follow-up on Tuesday. We will trial treating him with hydrocodone and I did discuss the risks and benefits of this type of medication with both he and his daughter, but given Tylenol is no longer helping we will trial this.  No evidence acute neurovascular compromise. Not complaining of any back pain. Overall no clear evidence of trauma.  If x-rays do not demonstrate abnormality, consider duplex of the right lower extremity to rule out DVT as a cause of pain and edema. If negative, likely discharge to home with prescription for hydrocodone and continued close outpatient follow-up with orthopedics which is arranged. Care as above assigned to Dr. Joni Fears. Reeval pain, follow-up on RLE doppler to rule out DVT. ____________________________________________   FINAL CLINICAL IMPRESSION(S) / ED DIAGNOSES  Final diagnoses:  Right leg pain      Delman Kitten, MD 09/01/15 1524

## 2015-09-04 DIAGNOSIS — M5416 Radiculopathy, lumbar region: Secondary | ICD-10-CM | POA: Diagnosis not present

## 2015-09-06 DIAGNOSIS — M5416 Radiculopathy, lumbar region: Secondary | ICD-10-CM | POA: Diagnosis not present

## 2015-09-13 DIAGNOSIS — M25551 Pain in right hip: Secondary | ICD-10-CM | POA: Diagnosis not present

## 2015-09-13 DIAGNOSIS — M5416 Radiculopathy, lumbar region: Secondary | ICD-10-CM | POA: Diagnosis not present

## 2015-09-18 DIAGNOSIS — M5416 Radiculopathy, lumbar region: Secondary | ICD-10-CM | POA: Diagnosis not present

## 2015-09-24 DIAGNOSIS — B3749 Other urogenital candidiasis: Secondary | ICD-10-CM | POA: Diagnosis not present

## 2015-10-01 DIAGNOSIS — Z79899 Other long term (current) drug therapy: Secondary | ICD-10-CM | POA: Diagnosis not present

## 2015-10-02 DIAGNOSIS — M5416 Radiculopathy, lumbar region: Secondary | ICD-10-CM | POA: Diagnosis not present

## 2015-10-15 DIAGNOSIS — N400 Enlarged prostate without lower urinary tract symptoms: Secondary | ICD-10-CM | POA: Diagnosis not present

## 2015-10-15 DIAGNOSIS — M545 Low back pain: Secondary | ICD-10-CM | POA: Diagnosis not present

## 2015-10-15 DIAGNOSIS — M6248 Contracture of muscle, other site: Secondary | ICD-10-CM | POA: Diagnosis not present

## 2015-10-15 DIAGNOSIS — R609 Edema, unspecified: Secondary | ICD-10-CM | POA: Diagnosis not present

## 2015-10-15 DIAGNOSIS — I10 Essential (primary) hypertension: Secondary | ICD-10-CM | POA: Diagnosis not present

## 2015-10-15 DIAGNOSIS — G629 Polyneuropathy, unspecified: Secondary | ICD-10-CM | POA: Diagnosis not present

## 2015-10-15 DIAGNOSIS — M436 Torticollis: Secondary | ICD-10-CM | POA: Diagnosis not present

## 2015-10-15 DIAGNOSIS — G20 Parkinson's disease: Secondary | ICD-10-CM | POA: Diagnosis not present

## 2015-10-17 ENCOUNTER — Ambulatory Visit (INDEPENDENT_AMBULATORY_CARE_PROVIDER_SITE_OTHER): Payer: Medicare Other | Admitting: Nurse Practitioner

## 2015-10-17 ENCOUNTER — Encounter: Payer: Self-pay | Admitting: Nurse Practitioner

## 2015-10-17 VITALS — BP 142/60 | HR 70 | Ht 68.0 in | Wt 161.4 lb

## 2015-10-17 DIAGNOSIS — R296 Repeated falls: Secondary | ICD-10-CM | POA: Diagnosis not present

## 2015-10-17 DIAGNOSIS — R1313 Dysphagia, pharyngeal phase: Secondary | ICD-10-CM

## 2015-10-17 DIAGNOSIS — G218 Other secondary parkinsonism: Secondary | ICD-10-CM

## 2015-10-17 DIAGNOSIS — G243 Spasmodic torticollis: Secondary | ICD-10-CM | POA: Diagnosis not present

## 2015-10-17 NOTE — Progress Notes (Signed)
[facility]  PATIENT: Ivan Clark DOB: 10-27-1928   REASON FOR VISIT: Follow-up for Parkinson's disease, cervical dystonia HISTORY FROM: Patient    HISTORY OF PRESENT ILLNESS:Ivan Clark is 82 years old male, accompanied by his wife, initially seen in consultation by his primary care physician [name] for cervical dystonia, mild parkinsonian features. He has past medical history of bladder cancer, hypertension, prostate hypertrophy, over the past 20 years, he also developed gradual onset of anterocollois. Since 2012, wife noticed that he has slowed down significantly, had difficulty initiate gait, small shuffling gait, occasionally bilateral hands tremor, he also developed frequent lightheadedness, happen when he gets up quickly from sitting position, no passing out, consistent with a orthostatic blood pressure changes. He also has chronic constipation, acting out of dreams, loss of sense of smell for a long time period. He contributed to his loss of smell to his long time smoking history, he quit smoking 11 years ago, he used to smoke 2 packs a day. MRI of brain in August 2011: mild atrophy,small vessel disease, no acute lesions. MRA of brain was normal On examination he has mild parkinsonian features, Sinemet 25/100 3 times a day since 2012, he tolerated the medication well, there is mild improvement in his gait, he underwent emergency appendectomy in June 2013, recovered well from his surgery. He denies memory trouble. Selegiline since 04/2012, did very well, but had few episodes of dizziness,   Fainting spell in March 5th 2014, he got up early morning, around 5:45 AM, he was in bathroom, he felt funny after taking few steps, landed on the floor, he was taken to [name], was found to have orthostatic hypotension, was sent to the emergency room, reported normal CT scan. He continues to complain of lightheadedness when standing up quickly,  He began  to receive EMG guided butulinum toxin injection for his anterocollis and sialorrhea since March 19th 2014, the first one was in March 2014, the second injection was in July 2nd 2014, each time, he has mild improvement in straighting up his neck better, so he can walk better.  The third injection was October 10th 2014 , the same amount of xeomin 100 units total, 50 units to sternocleidal mastoid muscles, 5 days after the injection, he began to notice difficulty swallowing, especially with liquid, and hard solid food, eventually was admitted to hospital in February 28, 2013 swallowing study has demonstrated moderate pharyngeal and cervical esophageal dysphagia. He require NG tube for one day, dysphagia gradually resolved  He fell in April 09, 2013, with right hip fracture, he had a right hemiarthroplasty, recovered very well, is ambulating without assistance again, He is taking Sinemet IR 25/100 3 times a day, Sinemet CR 50/200 one tablet every night. He has no significant memory trouble, mild REM sleep disorder  UPDATE Apr 21 2014: He is doing good, exercise regularly with a personal trainier twice a week, stretching his legs, he continue have gradual worsening neck flexion forward, denies significant swallowing difficulty, has occasionally choking episode on certain texture of food, such as grapes skin, he still eats regular food, drinking water without difficulty Over the past few months, he noticed early morning calf muscle cramping, worsening low back pain, bilateral feet numbness, no bowel and bladder incontinence. UPDATE June 8th 2016:I have reviewed consult note from movement specialist [name] in March 2016, who concurred the diagnosis of mild Parkinson's disease, cervical dystonia, agree on current management of Sinemet immediate release 25/100 mg 3  times a day, Sinemet CR 50/200 mg once every night He has mild trouble swallowing, with big chunk solid food, he is still able to eat regular  food, acting out of dreams, worsening anterocollis, still able to ambulate without assistance  His wife is using 24-hour oxygen now because of bronchiectasis  UPDATE 12/08/2016[name], 82 year old male returns for follow-up. He has a history of mild Parkinson's disease and cervical dystonia and mild dysphagia. He can chock on certain foods such as grapes and he tries to avoid those. He has no problems with liquids. He denies any recent falls, he uses a cane for ambulation. He does some stretching exercises. He does not perform any neck exercises. He reports that his memory is good, he is currently on Sinemet extended release at night and regular release 3 times a day before meals, he is also on Azilect. He returns for reevaluation. UPDATE 6/7/17CM  Ivan Clark 82 year old male returns for follow-up. He has a history of Parkinson's disease and cervical dystonia and mild dysphagia. Since last seen 6 months ago he has several falls with MRI from orthopedist in [place]  (epidural steroid 09-06-15 spinal nerve root block 2 wks ago5-23-17))  he has moved from his private home to living at [facility] in [place], [place].  Has [facility] orders to be signed for a hospital bed in a wheelchair. He reports that his appetite is better and he is chocking less. He has been set up for some physical therapy but that has not started. He reports no problems with his memory. He remains on Sinemet and Azilect. Minimal tremor seen today. He returns for reevaluation    REVIEW OF SYSTEMS: Full 14 system review of systems performed and notable only for those listed, all others are neg:  Constitutional: Activity change  Cardiovascular: neg Ear/Nose/Throat: neg  Skin: neg Eyes: neg Respiratory: neg Gastroitestinal: Urgency  Hematology/Lymphatic: neg  Endocrine: neg Musculoskeletal: Back pain walking difficulty Allergy/Immunology: neg Neurological: Numbness in the feet Psychiatric: neg Sleep : neg   ALLERGIES: No  Known Allergies  HOME MEDICATIONS: Outpatient Prescriptions Prior to Visit  Medication Sig Dispense Refill  . amLODipine (NORVASC) 10 MG tablet Take 1 tablet (10 mg total) by mouth daily. (Patient taking differently: Take 5 mg by mouth daily. ) 30 tablet 1  . aspirin EC 81 MG tablet Take 1 tablet (81 mg total) by mouth daily after supper.    [name] atorvastatin (LIPITOR) 20 MG tablet Take 1 tablet (20 mg total) by mouth daily. 30 tablet 1  . b complex vitamins tablet Take 1 tablet by mouth daily.    . carbidopa-levodopa (SINEMET CR) 50-200 MG tablet Take 1 tablet by mouth at bedtime. 30 tablet 11  . carbidopa-levodopa (SINEMET IR) 25-100 MG per tablet Take 1 tablet by mouth 3 (three) times daily. 90 tablet 11  . finasteride (PROSCAR) 5 MG tablet   0  . irbesartan (AVAPRO) 150 MG tablet Take 1 tablet (150 mg total) by mouth 2 (two) times daily. (Patient taking differently: Take 150 mg by mouth daily. ) 60 tablet 1  . Multiple Vitamin (MULTIVITAMIN WITH MINERALS) TABS Take 1 tablet by mouth daily.    . rasagiline (AZILECT) 1 MG TABS tablet Take 1 tablet (1 mg total) by mouth daily. 30 tablet 11  . tamsulosin (FLOMAX) 0.4 MG CAPS capsule Take 1 capsule (0.4 mg total) by mouth daily after supper. 30 capsule 1  . carbidopa-levodopa (SINEMET IR) 25-100 MG tablet Take 1 tablet by mouth 3 (three)  times daily. 270 tablet 3  . carbidopa-levodopa (SINEMET IR) 25-100 MG tablet TAKE 1 TABLET BY MOUTH 3 TIMES A DAY 270 tablet 3  . Cholecalciferol (VITAMIN D-3 PO) Take 5,000 Units by mouth daily.    . polyethylene glycol (MIRALAX / GLYCOLAX) packet Take 17 g by mouth daily as needed. 14 each 0  . traMADol (ULTRAM) 50 MG tablet Take 1 tablet (50 mg total) by mouth stat. (Patient not taking: Reported on 10/17/2015) 12 tablet 0   Facility-Administered Medications Prior to Visit  Medication Dose Route Frequency Provider Last Rate Last Dose  . mitomycin (MUTAMYCIN) chemo injection 40 mg  40 mg Bladder Instillation Once  Festus Aloe, MD        PAST MEDICAL HISTORY: Past Medical History  Diagnosis Date  . Hypertension   . Hyperlipidemia   . Aneurysm of infrarenal abdominal aorta (HCC) SUPRAILIAC  4CM PER CT 10-20-2010--  STABLE  . Bladder cancer ([place]) RECURRENT  (FIRST DX  2001)    HX TURBT AND CHEMO BLADDER INSTILLATION TX'S  . Parkinson disease ([place])   . Frequency of urination   . Nocturia   . Orthostatic hypotension   . Peripheral neuropathy ([place])     PAST SURGICAL HISTORY: Past Surgical History  Procedure Laterality Date  . Tonsillectomy and adenoidectomy  1938  . Left inguinal hernia repair  1996  . Transurethral resection of bladder tumor  09-11-1999    BLADDER CANCER  . Cysto/ retrograde pyelogram/ bladder bx's  07-03-2004  . Cysto/ bladder bx/ fulgeration bladder tumor  10-09-2004  . Appendectomy  10-20-2010  . Cystoscopy with biopsy  11/28/2011    Procedure: CYSTOSCOPY WITH BIOPSY;  Surgeon: Fredricka Bonine, MD;  Location: [facility];  Service: Urology;  Laterality: N/A;  . Vasectomy    . Hip arthroplasty Right 04/09/2013    Procedure: ARTHROPLASTY BIPOLAR HIP;  Surgeon: Augustin Schooling, MD;  Location: [facility];  Service: Orthopedics;  Laterality: Right;    FAMILY HISTORY: Family History  Problem Relation Age of Onset  . Heart failure Father   . Alzheimer's disease Mother     SOCIAL HISTORY: Social History   Social History  . Marital Status: Married    Spouse Name: Katharine Look  . Number of Children: 4  . Years of Education: college   Occupational History  .      retired   Social History Main Topics  . Smoking status: Former Smoker -- 2.00 packs/day for 50 years    Types: Cigarettes    Quit date: 05/13/1999  . Smokeless tobacco: Never Used  . Alcohol Use: No  . Drug Use: No  . Sexual Activity: Not on file   Other Topics Concern  . Not on file   Social History Narrative   Patient lives at [facility] in [place], [place]   Education- college  education.   Retired.   Right handed.   Caffeine- Very Little     PHYSICAL EXAM  Filed Vitals:   10/17/15 1334  BP: 142/60  Pulse: 70  Height: 5\' 8"  (1.727 m)  Weight: 161 lb 6.4 oz (73.211 kg)   Body mass index is 24.55 kg/(m^2). Generalized: Well developed, in no acute distress, well groomed  Head: normocephalic and atraumatic,. Oropharynx benign  Neck: He has moderate aterocollis,  Cardiac: Regular rate rhythm, no murmur Skin 1+ pitting edema in the ankles  Neurological examination   Mentation: Alert oriented to time, place, history taking. Attention span and concentration appropriate. Recent and  remote memory intact. Follows all commands speech and language fluent.   Cranial nerve II-XII: Pupils were equal round reactive to light extraocular movements were full, visual field were full on confrontational test. Facial sensation and strength were normal. hearing was intact to finger rubbing bilaterally. Uvula tongue midline. head turning and shoulder shrug were normal and symmetric.Tongue protrusion into cheek strength was normal. Motor: normal bulk and tone, full strength in the BUE, BLE, fine finger movements normal, no pronator drift. He has mild bilateral limb and nuchal rigidity  Coordination: finger-nose-finger, heel-to-shin bilaterally, no dysmetria Reflexes: Brachioradialis 2/2, biceps 2/2, triceps 2/2, patellar 2/2, Achilles 2/2, plantar responses were flexor bilaterally. Gait and Station: Not ambulated in wheelchair   DIAGNOSTIC DATA (LABS, IMAGING, TESTING) -  ASSESSMENT AND PLAN 82 y.o. year old male has a past medical history of Parkinson disease (HCC);orthostatic hypotension; and Peripheral neuropathy ([place]). Cervical dystonia here to follow-up    Continue Sinemet 25/ 100 3 times a day  Continue  Sinemet CR 50/200 at night  Stay well-hydrated for orthostatic hypotension For his dysphagia double swallow after each mouthful Continue Azilect 1 mg daily    Rx for wheelchair and hospital bed signed  Follow-up in 6 months, next with Dr. Luan Pulling, [facility], [facility], [facility] [address] [place], [place] [address] [phone]

## 2015-10-17 NOTE — Patient Instructions (Signed)
Continue Sinemet 25/ 100 3 times a day  Continue  Sinemet CR 50/200 at night  Stay well-hydrated for orthostatic hypotension For his dysphagia double swallow after each mouthful Continue Azilect 1 mg daily  Rx for wheelchair and hospital bed Follow-up in 6 months, next with Dr. Krista Blue

## 2015-10-18 DIAGNOSIS — I10 Essential (primary) hypertension: Secondary | ICD-10-CM | POA: Diagnosis not present

## 2015-10-18 DIAGNOSIS — Z7982 Long term (current) use of aspirin: Secondary | ICD-10-CM | POA: Diagnosis not present

## 2015-10-18 DIAGNOSIS — R296 Repeated falls: Secondary | ICD-10-CM | POA: Diagnosis not present

## 2015-10-18 DIAGNOSIS — R634 Abnormal weight loss: Secondary | ICD-10-CM | POA: Diagnosis not present

## 2015-10-18 DIAGNOSIS — G629 Polyneuropathy, unspecified: Secondary | ICD-10-CM | POA: Diagnosis not present

## 2015-10-18 DIAGNOSIS — G20 Parkinson's disease: Secondary | ICD-10-CM | POA: Diagnosis not present

## 2015-10-26 DIAGNOSIS — R1310 Dysphagia, unspecified: Secondary | ICD-10-CM | POA: Diagnosis not present

## 2015-10-26 DIAGNOSIS — G20 Parkinson's disease: Secondary | ICD-10-CM | POA: Diagnosis not present

## 2015-10-26 DIAGNOSIS — S72019A Unspecified intracapsular fracture of unspecified femur, initial encounter for closed fracture: Secondary | ICD-10-CM | POA: Diagnosis not present

## 2015-10-30 NOTE — Progress Notes (Signed)
I have reviewed and agreed above plan. 

## 2015-11-19 DIAGNOSIS — Z79899 Other long term (current) drug therapy: Secondary | ICD-10-CM | POA: Diagnosis not present

## 2015-11-19 DIAGNOSIS — L989 Disorder of the skin and subcutaneous tissue, unspecified: Secondary | ICD-10-CM | POA: Diagnosis not present

## 2015-11-19 DIAGNOSIS — R634 Abnormal weight loss: Secondary | ICD-10-CM | POA: Diagnosis not present

## 2015-11-19 DIAGNOSIS — M5416 Radiculopathy, lumbar region: Secondary | ICD-10-CM | POA: Diagnosis not present

## 2015-11-19 DIAGNOSIS — M5030 Other cervical disc degeneration, unspecified cervical region: Secondary | ICD-10-CM | POA: Diagnosis not present

## 2015-11-19 DIAGNOSIS — R300 Dysuria: Secondary | ICD-10-CM | POA: Diagnosis not present

## 2015-11-19 DIAGNOSIS — G9009 Other idiopathic peripheral autonomic neuropathy: Secondary | ICD-10-CM | POA: Diagnosis not present

## 2015-11-19 DIAGNOSIS — N401 Enlarged prostate with lower urinary tract symptoms: Secondary | ICD-10-CM | POA: Diagnosis not present

## 2015-11-19 DIAGNOSIS — M4806 Spinal stenosis, lumbar region: Secondary | ICD-10-CM | POA: Diagnosis not present

## 2015-11-25 DIAGNOSIS — S72019A Unspecified intracapsular fracture of unspecified femur, initial encounter for closed fracture: Secondary | ICD-10-CM | POA: Diagnosis not present

## 2015-11-25 DIAGNOSIS — G20 Parkinson's disease: Secondary | ICD-10-CM | POA: Diagnosis not present

## 2015-11-25 DIAGNOSIS — R1310 Dysphagia, unspecified: Secondary | ICD-10-CM | POA: Diagnosis not present

## 2015-11-26 DIAGNOSIS — R609 Edema, unspecified: Secondary | ICD-10-CM | POA: Diagnosis not present

## 2015-11-26 DIAGNOSIS — G9009 Other idiopathic peripheral autonomic neuropathy: Secondary | ICD-10-CM | POA: Diagnosis not present

## 2015-11-26 DIAGNOSIS — R300 Dysuria: Secondary | ICD-10-CM | POA: Diagnosis not present

## 2015-11-26 DIAGNOSIS — R350 Frequency of micturition: Secondary | ICD-10-CM | POA: Diagnosis not present

## 2015-11-26 DIAGNOSIS — L304 Erythema intertrigo: Secondary | ICD-10-CM | POA: Diagnosis not present

## 2015-11-26 DIAGNOSIS — N401 Enlarged prostate with lower urinary tract symptoms: Secondary | ICD-10-CM | POA: Diagnosis not present

## 2015-11-26 DIAGNOSIS — R809 Proteinuria, unspecified: Secondary | ICD-10-CM | POA: Diagnosis not present

## 2015-11-26 DIAGNOSIS — N390 Urinary tract infection, site not specified: Secondary | ICD-10-CM | POA: Diagnosis not present

## 2015-12-04 DIAGNOSIS — G20 Parkinson's disease: Secondary | ICD-10-CM | POA: Diagnosis not present

## 2015-12-04 DIAGNOSIS — G9009 Other idiopathic peripheral autonomic neuropathy: Secondary | ICD-10-CM | POA: Diagnosis not present

## 2015-12-04 DIAGNOSIS — M5416 Radiculopathy, lumbar region: Secondary | ICD-10-CM | POA: Diagnosis not present

## 2015-12-04 DIAGNOSIS — N401 Enlarged prostate with lower urinary tract symptoms: Secondary | ICD-10-CM | POA: Diagnosis not present

## 2015-12-11 ENCOUNTER — Telehealth: Payer: Self-pay | Admitting: Nurse Practitioner

## 2015-12-11 MED ORDER — CARBIDOPA-LEVODOPA 25-100 MG PO TABS: 1.0000 | ORAL_TABLET | Freq: Three times a day (TID) | ORAL | 3 refills | Status: DC

## 2015-12-11 MED ORDER — CARBIDOPA-LEVODOPA ER 50-200 MG PO TBCR: 1.0000 | EXTENDED_RELEASE_TABLET | Freq: Every day | ORAL | 3 refills | Status: DC

## 2015-12-11 NOTE — Telephone Encounter (Signed)
Patient's daughter is calling to get new Rx's called in for carbidopa-levodopa (SINEMET CR) 50-200 MG tablet and carbidopa-levodopa (SINEMET IR) 25-100 MG per tablet to [facility] for the patient. [facility] fax # is [phone].The patient has changed to mail order.

## 2015-12-11 NOTE — Addendum Note (Signed)
Addended byOliver Hum on: 12/11/2015 05:15 PM   Modules accepted: Orders

## 2015-12-17 DIAGNOSIS — G20 Parkinson's disease: Secondary | ICD-10-CM | POA: Diagnosis not present

## 2015-12-17 DIAGNOSIS — G629 Polyneuropathy, unspecified: Secondary | ICD-10-CM | POA: Diagnosis not present

## 2015-12-18 DIAGNOSIS — G20 Parkinson's disease: Secondary | ICD-10-CM | POA: Diagnosis not present

## 2015-12-18 DIAGNOSIS — H532 Diplopia: Secondary | ICD-10-CM | POA: Diagnosis not present

## 2015-12-19 DIAGNOSIS — Z791 Long term (current) use of non-steroidal anti-inflammatories (NSAID): Secondary | ICD-10-CM | POA: Diagnosis not present

## 2015-12-19 DIAGNOSIS — I10 Essential (primary) hypertension: Secondary | ICD-10-CM | POA: Diagnosis not present

## 2015-12-19 DIAGNOSIS — G20 Parkinson's disease: Secondary | ICD-10-CM | POA: Diagnosis not present

## 2015-12-19 DIAGNOSIS — G629 Polyneuropathy, unspecified: Secondary | ICD-10-CM | POA: Diagnosis not present

## 2015-12-19 DIAGNOSIS — M439 Deforming dorsopathy, unspecified: Secondary | ICD-10-CM | POA: Diagnosis not present

## 2015-12-19 DIAGNOSIS — Z9181 History of falling: Secondary | ICD-10-CM | POA: Diagnosis not present

## 2015-12-24 ENCOUNTER — Ambulatory Visit: Payer: Self-pay

## 2015-12-26 DIAGNOSIS — R1310 Dysphagia, unspecified: Secondary | ICD-10-CM | POA: Diagnosis not present

## 2015-12-26 DIAGNOSIS — S72019A Unspecified intracapsular fracture of unspecified femur, initial encounter for closed fracture: Secondary | ICD-10-CM | POA: Diagnosis not present

## 2015-12-26 DIAGNOSIS — G20 Parkinson's disease: Secondary | ICD-10-CM | POA: Diagnosis not present

## 2016-01-09 ENCOUNTER — Encounter: Payer: Self-pay | Admitting: Urology

## 2016-01-09 ENCOUNTER — Ambulatory Visit (INDEPENDENT_AMBULATORY_CARE_PROVIDER_SITE_OTHER): Payer: Medicare Other | Admitting: Urology

## 2016-01-09 DIAGNOSIS — L570 Actinic keratosis: Secondary | ICD-10-CM | POA: Diagnosis not present

## 2016-01-09 DIAGNOSIS — R3913 Splitting of urinary stream: Secondary | ICD-10-CM | POA: Diagnosis not present

## 2016-01-09 DIAGNOSIS — Z8551 Personal history of malignant neoplasm of bladder: Secondary | ICD-10-CM | POA: Diagnosis not present

## 2016-01-09 DIAGNOSIS — L820 Inflamed seborrheic keratosis: Secondary | ICD-10-CM | POA: Diagnosis not present

## 2016-01-09 DIAGNOSIS — N400 Enlarged prostate without lower urinary tract symptoms: Secondary | ICD-10-CM | POA: Diagnosis not present

## 2016-01-09 DIAGNOSIS — Z85828 Personal history of other malignant neoplasm of skin: Secondary | ICD-10-CM | POA: Diagnosis not present

## 2016-01-09 LAB — URINALYSIS, COMPLETE
Bilirubin, UA: NEGATIVE
GLUCOSE, UA: NEGATIVE
Ketones, UA: NEGATIVE
Nitrite, UA: NEGATIVE
RBC, UA: NEGATIVE
SPEC GRAV UA: 1.025 (ref 1.005–1.030)
Urobilinogen, Ur: 1 mg/dL (ref 0.2–1.0)
pH, UA: 5.5 (ref 5.0–7.5)

## 2016-01-09 LAB — MICROSCOPIC EXAMINATION
Bacteria, UA: NONE SEEN
Epithelial Cells (non renal): NONE SEEN /HPF

## 2016-01-09 NOTE — Progress Notes (Addendum)
H&P  Chief Complaint: bladder ca, BPH  History of Present Illness:   1) Bladder Ca - Jul 2013 LGTa on bbx with [facility]. Prior 2001 resection, 2006 resection - treated with BCG.  Last upper tract: Abd U/S Sept 2010 Last cystoscopy: Mar 2016  2) BPH - patient on combination therapy tamsulosin and finasteride. He tried Avodart, doxazosin and Rapaflo in the past. Neurogenic risk includes Parkinson's. -Jun 2012 PSA 0.45  Today, Mr. Septer is seen for the above. He has been well. He moved to [place] and is transferring his care from [place]. He has had no bothersome LUTS or gross hematuria. He does report occasional dysuria, nocturia, intermittent urinary stream. He reports a recent urinary tract infection and was treated with antibiotics.  UA today is clear apart from 6-10 white blood cells.   Past Medical History:  Diagnosis Date  . Aneurysm of infrarenal abdominal aorta (HCC) SUPRAILIAC  4CM PER CT 10-20-2010--  STABLE  . Bladder cancer ([place]) RECURRENT  (FIRST DX  2001)   HX TURBT AND CHEMO BLADDER INSTILLATION TX'S  . Frequency of urination   . Hyperlipidemia   . Hypertension   . Nocturia   . Orthostatic hypotension   . Parkinson disease ([place])   . Peripheral neuropathy [facility])    Past Surgical History:  Procedure Laterality Date  . APPENDECTOMY  10-20-2010  . CYSTO/ BLADDER BX/ FULGERATION BLADDER TUMOR  10-09-2004  . CYSTO/ RETROGRADE PYELOGRAM/ BLADDER BX'S  07-03-2004  . CYSTOSCOPY WITH BIOPSY  11/28/2011   Procedure: CYSTOSCOPY WITH BIOPSY;  Surgeon: Fredricka Bonine, MD;  Location: [facility];  Service: Urology;  Laterality: N/A;  . HIP ARTHROPLASTY Right 04/09/2013   Procedure: ARTHROPLASTY BIPOLAR HIP;  Surgeon: Augustin Schooling, MD;  Location: [facility];  Service: Orthopedics;  Laterality: Right;  . LEFT INGUINAL HERNIA REPAIR  1996  . TONSILLECTOMY AND ADENOIDECTOMY  1938  . TRANSURETHRAL RESECTION OF BLADDER TUMOR  09-11-1999   BLADDER CANCER  .  VASECTOMY      Home Medications:   (Not in a hospital admission) Allergies: No Known Allergies  Family History  Problem Relation Age of Onset  . Heart failure Father   . Alzheimer's disease Mother    Social History:  reports that he quit smoking about 16 years ago. His smoking use included Cigarettes. He has a 100.00 pack-year smoking history. He has never used smokeless tobacco. He reports that he does not drink alcohol or use drugs.  ROS: A complete review of systems was performed.  All systems are negative except for pertinent findings as noted. Review of Systems  Constitutional: Positive for weight loss.  Eyes: Positive for double vision.  Cardiovascular: Positive for leg swelling.  Gastrointestinal: Positive for constipation.  Musculoskeletal: Positive for joint pain.     Physical Exam:  Vital signs in last 24 hours: @VSRANGES @ General:  Alert and oriented, No acute distress HEENT: Normocephalic, atraumatic Cardiovascular: Regular rate and rhythm Lungs: Regular rate and effort Abdomen: Soft, nontender, nondistended, no abdominal masses Back: No CVA tenderness Extremities: No edema Neurologic: Grossly intact GU: Penis uncircumcised, no phimosis mass or lesion of foreskin. Testicles descended bilaterally and although atrophic were palpably normal. On digital rectal exam the prostate is about 50 g in size. Prostate smooth without hard area or nodule.  Laboratory Data:  No results found for this or any previous visit (from the past 24 hour(s)). No results found for this or any previous visit (from the past 240 hour(s)). Creatinine: No  results for input(s): CREATININE in the last 168 hours.  Impression/Assessment/plan:  1) bladder ca - f/u for cystoscopy. I discussed one of my colleagues might see him and perform the procedure.   2) BPH - continue combination tx.   Ivan Clark 01/09/2016, 11:35 AM

## 2016-01-26 DIAGNOSIS — R1310 Dysphagia, unspecified: Secondary | ICD-10-CM | POA: Diagnosis not present

## 2016-01-26 DIAGNOSIS — S72019A Unspecified intracapsular fracture of unspecified femur, initial encounter for closed fracture: Secondary | ICD-10-CM | POA: Diagnosis not present

## 2016-01-26 DIAGNOSIS — G20 Parkinson's disease: Secondary | ICD-10-CM | POA: Diagnosis not present

## 2016-02-04 DIAGNOSIS — Z79899 Other long term (current) drug therapy: Secondary | ICD-10-CM | POA: Diagnosis not present

## 2016-02-25 DIAGNOSIS — G20 Parkinson's disease: Secondary | ICD-10-CM | POA: Diagnosis not present

## 2016-02-25 DIAGNOSIS — S72019A Unspecified intracapsular fracture of unspecified femur, initial encounter for closed fracture: Secondary | ICD-10-CM | POA: Diagnosis not present

## 2016-02-25 DIAGNOSIS — R1310 Dysphagia, unspecified: Secondary | ICD-10-CM | POA: Diagnosis not present

## 2016-03-03 DIAGNOSIS — I10 Essential (primary) hypertension: Secondary | ICD-10-CM | POA: Diagnosis not present

## 2016-03-03 DIAGNOSIS — N189 Chronic kidney disease, unspecified: Secondary | ICD-10-CM | POA: Diagnosis not present

## 2016-03-03 DIAGNOSIS — L570 Actinic keratosis: Secondary | ICD-10-CM | POA: Diagnosis not present

## 2016-03-03 DIAGNOSIS — M6281 Muscle weakness (generalized): Secondary | ICD-10-CM | POA: Diagnosis not present

## 2016-03-03 DIAGNOSIS — D0439 Carcinoma in situ of skin of other parts of face: Secondary | ICD-10-CM | POA: Diagnosis not present

## 2016-03-03 DIAGNOSIS — R269 Unspecified abnormalities of gait and mobility: Secondary | ICD-10-CM | POA: Diagnosis not present

## 2016-03-03 DIAGNOSIS — R300 Dysuria: Secondary | ICD-10-CM | POA: Diagnosis not present

## 2016-03-03 DIAGNOSIS — D485 Neoplasm of uncertain behavior of skin: Secondary | ICD-10-CM | POA: Diagnosis not present

## 2016-03-03 DIAGNOSIS — R609 Edema, unspecified: Secondary | ICD-10-CM | POA: Diagnosis not present

## 2016-03-03 DIAGNOSIS — M79676 Pain in unspecified toe(s): Secondary | ICD-10-CM | POA: Diagnosis not present

## 2016-03-03 DIAGNOSIS — G629 Polyneuropathy, unspecified: Secondary | ICD-10-CM | POA: Diagnosis not present

## 2016-03-03 DIAGNOSIS — C4442 Squamous cell carcinoma of skin of scalp and neck: Secondary | ICD-10-CM | POA: Diagnosis not present

## 2016-03-03 DIAGNOSIS — N401 Enlarged prostate with lower urinary tract symptoms: Secondary | ICD-10-CM | POA: Diagnosis not present

## 2016-03-03 DIAGNOSIS — G9009 Other idiopathic peripheral autonomic neuropathy: Secondary | ICD-10-CM | POA: Diagnosis not present

## 2016-03-03 DIAGNOSIS — B351 Tinea unguium: Secondary | ICD-10-CM | POA: Diagnosis not present

## 2016-03-04 DIAGNOSIS — Z79899 Other long term (current) drug therapy: Secondary | ICD-10-CM | POA: Diagnosis not present

## 2016-03-10 DIAGNOSIS — M5410 Radiculopathy, site unspecified: Secondary | ICD-10-CM | POA: Diagnosis not present

## 2016-03-10 DIAGNOSIS — M4800 Spinal stenosis, site unspecified: Secondary | ICD-10-CM | POA: Diagnosis not present

## 2016-03-10 DIAGNOSIS — G9009 Other idiopathic peripheral autonomic neuropathy: Secondary | ICD-10-CM | POA: Diagnosis not present

## 2016-03-10 DIAGNOSIS — N189 Chronic kidney disease, unspecified: Secondary | ICD-10-CM | POA: Diagnosis not present

## 2016-03-10 DIAGNOSIS — G20 Parkinson's disease: Secondary | ICD-10-CM | POA: Diagnosis not present

## 2016-03-10 DIAGNOSIS — I129 Hypertensive chronic kidney disease with stage 1 through stage 4 chronic kidney disease, or unspecified chronic kidney disease: Secondary | ICD-10-CM | POA: Diagnosis not present

## 2016-03-17 ENCOUNTER — Ambulatory Visit (INDEPENDENT_AMBULATORY_CARE_PROVIDER_SITE_OTHER): Payer: Medicare Other | Admitting: Urology

## 2016-03-17 ENCOUNTER — Encounter: Payer: Self-pay | Admitting: Urology

## 2016-03-17 ENCOUNTER — Telehealth: Payer: Self-pay

## 2016-03-17 VITALS — BP 133/75 | HR 86 | Ht 66.0 in | Wt 161.4 lb

## 2016-03-17 DIAGNOSIS — Z8551 Personal history of malignant neoplasm of bladder: Secondary | ICD-10-CM

## 2016-03-17 DIAGNOSIS — N3000 Acute cystitis without hematuria: Secondary | ICD-10-CM

## 2016-03-17 DIAGNOSIS — Z79899 Other long term (current) drug therapy: Secondary | ICD-10-CM | POA: Diagnosis not present

## 2016-03-17 LAB — URINALYSIS, COMPLETE
BILIRUBIN UA: NEGATIVE
Glucose, UA: NEGATIVE
Ketones, UA: NEGATIVE
NITRITE UA: NEGATIVE
PH UA: 6 (ref 5.0–7.5)
Specific Gravity, UA: >1.03 — ABNORMAL HIGH (ref 1.005–1.030)
UUROB: 2 mg/dL — AB (ref 0.2–1.0)

## 2016-03-17 LAB — MICROSCOPIC EXAMINATION

## 2016-03-17 MED ORDER — CEPHALEXIN 500 MG PO CAPS: 500.0000 mg | ORAL_CAPSULE | Freq: Two times a day (BID) | ORAL | 0 refills | Status: AC

## 2016-03-17 MED ORDER — LIDOCAINE HCL 2 % EX GEL: 1.0000 "application " | Freq: Once | CUTANEOUS | Status: DC

## 2016-03-17 MED ORDER — CIPROFLOXACIN HCL 500 MG PO TABS: 500.0000 mg | ORAL_TABLET | Freq: Once | ORAL | Status: DC

## 2016-03-17 MED ORDER — CEPHALEXIN 500 MG PO CAPS: 500.0000 mg | ORAL_CAPSULE | Freq: Two times a day (BID) | ORAL | 0 refills | Status: DC

## 2016-03-17 NOTE — Telephone Encounter (Signed)
-----   Message from Festus Aloe, MD sent at 03/17/2016 10:20 AM EST ----- Can you send in:   Cephalexin 500 mg, one po BID, #20, no refill   to [facility] for pt. The pharmacy in the system is a mail order. Thanks.

## 2016-03-17 NOTE — Telephone Encounter (Signed)
Medication sent to pharmacy  

## 2016-03-17 NOTE — Progress Notes (Signed)
03/17/2016 10:05 AM   Ivan Clark 03/28/1929 [id number]  Referring provider: Lawerance Cruel, MD [name], [name] [address]  Chief Complaint  Patient presents with  . Cysto    Bladder Ca - Jul 2013 LGTa on bbx with [facility].    HPI: 1) Bladder Ca - Jul 2013 LGTa on bbx with [facility]. Prior 2001 resection, 2006 resection - treated with BCG.  Last upper tract: Abd U/S Sept 2010 Last cystoscopy: Mar 2016  2) BPH - patient on combination therapy tamsulosin and finasteride. He tried Avodart, doxazosin and Rapaflo in the past. Neurogenic risk includes Parkinson's. -Jun 2012 PSA 0.45 -Aug 2017 - nl DRE  Patient returns today for cystoscopy, but unfortunately has developed dysuria and his UA shows many bacteria, greater than 30 red cells and greater than 30 white cells. He reports being treated for a urinary tract infection last month. This is a new finding. His prior UA showed no bacteria.   I didn't see any Cx in Epic or Care Everywhere and did not see any old cx's in [place].    PMH: Past Medical History:  Diagnosis Date  . Aneurysm of infrarenal abdominal aorta (HCC) SUPRAILIAC  4CM PER CT 10-20-2010--  STABLE  . Bladder cancer ([place]) RECURRENT  (FIRST DX  2001)   HX TURBT AND CHEMO BLADDER INSTILLATION TX'S  . Frequency of urination   . Hyperlipidemia   . Hypertension   . Nocturia   . Orthostatic hypotension   . Parkinson disease ([place])   . Peripheral neuropathy [facility])     Surgical History: Past Surgical History:  Procedure Laterality Date  . APPENDECTOMY  10-20-2010  . CYSTO/ BLADDER BX/ FULGERATION BLADDER TUMOR  10-09-2004  . CYSTO/ RETROGRADE PYELOGRAM/ BLADDER BX'S  07-03-2004  . CYSTOSCOPY WITH BIOPSY  11/28/2011   Procedure: CYSTOSCOPY WITH BIOPSY;  Surgeon: Fredricka Bonine, MD;  Location: [facility];  Service: Urology;  Laterality: N/A;  . HIP ARTHROPLASTY Right 04/09/2013   Procedure: ARTHROPLASTY BIPOLAR HIP;   Surgeon: Augustin Schooling, MD;  Location: [facility];  Service: Orthopedics;  Laterality: Right;  . LEFT INGUINAL HERNIA REPAIR  1996  . TONSILLECTOMY AND ADENOIDECTOMY  1938  . TRANSURETHRAL RESECTION OF BLADDER TUMOR  09-11-1999   BLADDER CANCER  . VASECTOMY      Home Medications:    Medication List       Accurate as of 03/17/16 10:05 AM. Always use your most recent med list.          amLODipine 10 MG tablet Commonly known as:  NORVASC Take 1 tablet (10 mg total) by mouth daily.   aspirin EC 81 MG tablet Take 1 tablet (81 mg total) by mouth daily after supper.   atorvastatin 20 MG tablet Commonly known as:  LIPITOR Take 1 tablet (20 mg total) by mouth daily.   carbidopa-levodopa 25-100 MG tablet Commonly known as:  SINEMET IR Take 1 tablet by mouth 3 (three) times daily.   carbidopa-levodopa 50-200 MG tablet Commonly known as:  SINEMET CR Take 1 tablet by mouth at bedtime.   cholecalciferol 1000 units tablet Commonly known as:  VITAMIN D Take 1,000 Units by mouth daily.   docusate sodium 100 MG capsule Commonly known as:  COLACE Take 100 mg by mouth 2 (two) times daily.   finasteride 5 MG tablet Commonly known as:  PROSCAR   irbesartan 150 MG tablet Commonly known as:  AVAPRO Take 1 tablet (150 mg total) by  mouth 2 (two) times daily.   multivitamin with minerals Tabs tablet Take 1 tablet by mouth daily.   rasagiline 1 MG Tabs tablet Commonly known as:  AZILECT Take 1 tablet (1 mg total) by mouth daily.   tamsulosin 0.4 MG Caps capsule Commonly known as:  FLOMAX Take 1 capsule (0.4 mg total) by mouth daily after supper.       Allergies: No Known Allergies  Family History: Family History  Problem Relation Age of Onset  . Alzheimer's disease Mother   . Heart failure Father   . Kidney cancer Neg Hx   . Prostate cancer Neg Hx     Social History:  reports that he quit smoking about 16 years ago. His smoking use included Cigarettes. He has a 100.00  pack-year smoking history. He has never used smokeless tobacco. He reports that he does not drink alcohol or use drugs.  ROS:                                        Physical Exam: BP 133/75   Pulse 86   Ht 5\' 6"  (1.676 m)   Wt 73.2 kg (161 lb 6.4 oz)   BMI 26.05 kg/m   Constitutional:  Alert and oriented, No acute distress. HEENT: [place] AT, moist mucus membranes.  Trachea midline, no masses. Cardiovascular: No clubbing, cyanosis, or edema. Respiratory: Normal respiratory effort, no increased work of breathing. Neurologic: Grossly intact, no focal deficits, moving all 4 extremities. Psychiatric: Normal mood and affect.  Laboratory Data: Lab Results  Component Value Date   WBC 10.6 (H) 04/15/2013   HGB 10.9 (L) 04/15/2013   HCT 32.1 (L) 04/15/2013   MCV 90.9 04/15/2013   PLT 290 04/15/2013    Lab Results  Component Value Date   CREATININE 0.90 04/21/2013    No results found for: PSA  No results found for: TESTOSTERONE  No results found for: HGBA1C  Urinalysis    Component Value Date/Time   COLORURINE AMBER (A) 04/11/2013 1817   APPEARANCEUR Clear 01/09/2016 1135   LABSPEC 1.022 04/11/2013 1817   PHURINE 5.5 04/11/2013 1817   GLUCOSEU Negative 01/09/2016 1135   HGBUR LARGE (A) 04/11/2013 1817   BILIRUBINUR Negative 01/09/2016 1135   KETONESUR 15 (A) 04/11/2013 1817   PROTEINUR 2+ (A) 01/09/2016 1135   PROTEINUR 100 (A) 04/11/2013 1817   UROBILINOGEN 1.0 04/11/2013 1817   NITRITE Negative 01/09/2016 1135   NITRITE NEGATIVE 04/11/2013 1817   LEUKOCYTESUR Trace (A) 01/09/2016 1135    Pertinent Imaging:  Assessment & Plan:    1. Personal history of malignant neoplasm of bladder; UTI --  -send urine Cx, start cephalexin, f/u 3 weeks for cystoscopy.  - Urinalysis, Complete - ciprofloxacin (CIPRO) tablet 500 mg; Take 1 tablet (500 mg total) by mouth once. - lidocaine (XYLOCAINE) 2 % jelly 1 application; Place 1 application into the urethra  once.   No Follow-up on file.  Festus Aloe, [facility] [address] [place], [place] [address] [phone]

## 2016-03-21 LAB — CULTURE, URINE COMPREHENSIVE

## 2016-03-24 ENCOUNTER — Telehealth: Payer: Self-pay

## 2016-03-24 NOTE — Telephone Encounter (Signed)
-----   Message from Nori Riis, PA-C sent at 03/24/2016 12:06 AM EST ----- Patient has a +UCx.  They need to continue Keflex. They also need to take a probiotic with the antibiotic course.  The dosage is listed below:  L. acidophilus and L. casei (25 x 109 CFU/day for 2 days, then 50 x 109 CFU/day for duration of the antibiotic course)

## 2016-03-24 NOTE — Telephone Encounter (Signed)
Spoke with pt daughter, Abigail Butts, in reference to +ucx, abx, and probiotic. Abigail Butts voiced understanding.

## 2016-03-27 DIAGNOSIS — G20 Parkinson's disease: Secondary | ICD-10-CM | POA: Diagnosis not present

## 2016-03-27 DIAGNOSIS — R1310 Dysphagia, unspecified: Secondary | ICD-10-CM | POA: Diagnosis not present

## 2016-03-27 DIAGNOSIS — S72019A Unspecified intracapsular fracture of unspecified femur, initial encounter for closed fracture: Secondary | ICD-10-CM | POA: Diagnosis not present

## 2016-04-07 ENCOUNTER — Ambulatory Visit (INDEPENDENT_AMBULATORY_CARE_PROVIDER_SITE_OTHER): Payer: Medicare Other | Admitting: Urology

## 2016-04-07 VITALS — BP 163/69 | HR 80 | Ht 66.0 in | Wt 162.0 lb

## 2016-04-07 DIAGNOSIS — Z8551 Personal history of malignant neoplasm of bladder: Secondary | ICD-10-CM | POA: Diagnosis not present

## 2016-04-07 DIAGNOSIS — R300 Dysuria: Secondary | ICD-10-CM

## 2016-04-07 MED ORDER — CIPROFLOXACIN HCL 500 MG PO TABS: 500.0000 mg | ORAL_TABLET | Freq: Once | ORAL | Status: DC

## 2016-04-07 MED ORDER — LIDOCAINE HCL 2 % EX GEL
1.0000 "application " | Freq: Once | CUTANEOUS | Status: AC
  Administered 2016-04-07: 1 via URETHRAL

## 2016-04-07 MED ORDER — CEPHALEXIN 250 MG PO CAPS
500.0000 mg | ORAL_CAPSULE | Freq: Once | ORAL | Status: AC
  Administered 2016-04-07: 500 mg via ORAL

## 2016-04-07 MED ORDER — CEPHALEXIN 500 MG PO CAPS: 500.0000 mg | ORAL_CAPSULE | Freq: Two times a day (BID) | ORAL | 0 refills | Status: DC

## 2016-04-07 NOTE — Progress Notes (Signed)
   04/07/16  CC:  Chief Complaint  Patient presents with  . Cysto    HPI:  1) Bladder Ca - Jul 2013 LGTa on bbx with [facility]. Prior 2001 resection, 2006 resection - treated with BCG.  Last upper tract: Abd U/S Sept 2010 Last cystoscopy: Mar 2016  2) BPH - patient on combination therapy tamsulosin and finasteride. He tried Avodart, doxazosin and Rapaflo in the past. Neurogenic risk includes Parkinson's. -Jun 2012 PSA 0.45 -Aug 2017 - nl DRE  He presents today for cysto. He's also having dysuria. He completed cephalexin for a strep UTI. He has some mild dysuria but otherwise feels well. He cannot provide a specimen.   Blood pressure (!) 163/69, pulse 80, height 5\' 6"  (1.676 m), weight 73.5 kg (162 lb). NED. A&Ox3.   No respiratory distress   Abd soft, NT, ND Normal phallus with bilateral descended testicles  Cystoscopy Procedure Note  Patient identification was confirmed, informed consent was obtained, and patient was prepped using Betadine solution.  Lidocaine jelly was administered per urethral meatus.    Preoperative abx where received prior to procedure - cephalexin - based on last culture.    Pre-Procedure: - Inspection reveals a normal caliber ureteral meatus.  Procedure: The flexible cystoscope was introduced without difficulty - No urethral strictures/lesions are present. - Enlarged prostate  - Elevated bladder neck - Bilateral ureteral orifices identified - Bladder mucosa  reveals no ulcers, tumors, or lesions - No bladder stones - Trabeculation - moderate   Retroflexion shows prostate protrusion, normal mucosa.    Post-Procedure: - Patient tolerated the procedure well  Assessment/ Plan:  1) dysuria - cover with a few more days of cephalexin   2) h/o bladder cancer - normal cystoscopy today

## 2016-04-17 ENCOUNTER — Ambulatory Visit (INDEPENDENT_AMBULATORY_CARE_PROVIDER_SITE_OTHER): Payer: Medicare Other | Admitting: Neurology

## 2016-04-17 ENCOUNTER — Encounter: Payer: Self-pay | Admitting: Neurology

## 2016-04-17 VITALS — BP 120/60 | HR 78 | Ht 66.0 in | Wt 159.0 lb

## 2016-04-17 DIAGNOSIS — K117 Disturbances of salivary secretion: Secondary | ICD-10-CM | POA: Diagnosis not present

## 2016-04-17 DIAGNOSIS — G218 Other secondary parkinsonism: Secondary | ICD-10-CM | POA: Diagnosis not present

## 2016-04-17 DIAGNOSIS — G243 Spasmodic torticollis: Secondary | ICD-10-CM | POA: Diagnosis not present

## 2016-04-17 MED ORDER — CARBIDOPA-LEVODOPA 25-100 MG PO TABS: ORAL_TABLET | ORAL | 4 refills | Status: DC

## 2016-04-17 NOTE — Progress Notes (Signed)
[facility]  PATIENT: Ivan Clark DOB: [birth year]  HISTORY OF PRESENT ILLNESS:Ivan Clark is 82 years old male, accompanied by his wife, initially seen in consultation by his primary care physician [name] for cervical dystonia, mild parkinsonian features. He has past medical history of bladder cancer, hypertension, prostate hypertrophy, over the past 20 years, he also developed gradual onset of anterocollois. Since 2012, wife noticed that he has slowed down significantly, had difficulty initiate gait, small shuffling gait, occasionally bilateral hands tremor, he also developed frequent lightheadedness, happen when he gets up quickly from sitting position, no passing out, consistent with a orthostatic blood pressure changes. He also has chronic constipation, acting out of dreams, loss of sense of smell for a long time period. He contributed to his loss of smell to his long time smoking history, he quit smoking 11 years ago, he used to smoke 2 packs a day. MRI of brain in August 2011: mild atrophy,small vessel disease, no acute lesions. MRA of brain was normal On examination he has mild parkinsonian features, Sinemet 25/100 3 times a day since 2012, he tolerated the medication well, there is mild improvement in his gait, he underwent emergency appendectomy in June 2013, recovered well from his surgery. He denies memory trouble. Selegiline since 04/2012, did very well, but had few episodes of dizziness,   Fainting spell in March 5th 2014, he got up early morning, around 5:45 AM, he was in bathroom, he felt funny after taking few steps, landed on the floor, he was taken to [name], was found to have orthostatic hypotension, was sent to the emergency room, reported normal CT scan. He continues to complain of lightheadedness when standing up quickly,  He began to receive EMG guided butulinum toxin injection for his anterocollis and sialorrhea since March 19th  2014, the first one was in March 2014, the second injection was in July 2nd 2014, each time, he has mild improvement in straighting up his neck better, so he can walk better.  The third injection was October 10th 2014 , the same amount of xeomin 100 units total, 50 units to sternocleidal mastoid muscles, 5 days after the injection, he began to notice difficulty swallowing, especially with liquid, and hard solid food, eventually was admitted to hospital in February 28, 2013 swallowing study has demonstrated moderate pharyngeal and cervical esophageal dysphagia. He require NG tube for one day, dysphagia gradually resolved  He fell in April 09, 2013, with right hip fracture, he had a right hemiarthroplasty, recovered very well, is ambulating without assistance again, He is taking Sinemet IR 25/100 3 times a day, Sinemet CR 50/200 one tablet every night. He has no significant memory trouble, mild REM sleep disorder   UPDATE Apr 21 2014: He is doing good, exercise regularly with a personal trainier twice a week, stretching his legs, he continue have gradual worsening neck flexion forward, denies significant swallowing difficulty, has occasionally choking episode on certain texture of food, such as grapes skin, he still eats regular food, drinking water without difficulty Over the past few months, he noticed early morning calf muscle cramping, worsening low back pain, bilateral feet numbness, no bowel and bladder incontinence. UPDATE June 8th 2016: I have reviewed consult note from movement specialist [name] in March 2016, who concurred the diagnosis of mild Parkinson's disease, cervical dystonia, agree on current management of Sinemet immediate release 25/100 mg 3 times a day, Sinemet CR 50/200 mg once every night He has mild trouble  swallowing, with big chunk solid food, he is still able to eat regular food, acting out of dreams, worsening anterocollis, still able to ambulate without assistance   His wife is using 24-hour oxygen now because of bronchiectasis   UPDATE 12/08/2016[name], 82 year old male returns for follow-up. He has a history of mild Parkinson's disease and cervical dystonia and mild dysphagia. He can chock on certain foods such as grapes and he tries to avoid those. He has no problems with liquids. He denies any recent falls, he uses a cane for ambulation. He does some stretching exercises. He does not perform any neck exercises. He reports that his memory is good, he is currently on Sinemet extended release at night and regular release 3 times a day before meals, he is also on Azilect. He returns for reevaluation. UPDATE 6/7/17CM  Ivan Clark 82 year old male returns for follow-up. He has a history of Parkinson's disease and cervical dystonia and mild dysphagia. Since last seen 6 months ago he has several falls with MRI from orthopedist in [place]  (epidural steroid 09-06-15 spinal nerve root block 2 wks ago5-23-17))  he has moved from his private home to living at [facility] in [place], [place].  Has [facility] orders to be signed for a hospital bed in a wheelchair. He reports that his appetite is better and he is chocking less. He has been set up for some physical therapy but that has not started. He reports no problems with his memory. He remains on Sinemet and Azilect. Minimal tremor seen today. He returns for reevaluation   UPDATE 2016-04-30: His wife passed away in 05/25/2015,  He lives is Environmental [facility] living, he is doing well.  He is going physical therapy.  His legs feel like magnet, now walking with a walker.   He is taking Sinemet 25/100 3 times a day, carbidopa levodopa ER 50/200 one tablet at bedtime, Azilect 1 mg daily, he was noted to have tongue protrusion, mild dyskinesia  He sleeps on his back, sleeps well.  REVIEW OF SYSTEMS: Full 14 system review of systems performed and notable only for those listed, all others are neg:  As above  ALLERGIES: No Known  Allergies  HOME MEDICATIONS: Outpatient Medications Prior to Visit  Medication Sig Dispense Refill  . amLODipine (NORVASC) 10 MG tablet Take 1 tablet (10 mg total) by mouth daily. (Patient taking differently: Take 5 mg by mouth daily. ) 30 tablet 1  . aspirin EC 81 MG tablet Take 1 tablet (81 mg total) by mouth daily after supper.    [name] atorvastatin (LIPITOR) 20 MG tablet Take 1 tablet (20 mg total) by mouth daily. 30 tablet 1  . carbidopa-levodopa (SINEMET CR) 50-200 MG tablet Take 1 tablet by mouth at bedtime. 90 tablet 3  . carbidopa-levodopa (SINEMET IR) 25-100 MG tablet Take 1 tablet by mouth 3 (three) times daily. 270 tablet 3  . cholecalciferol (VITAMIN D) 1000 units tablet Take 2,000 Units by mouth daily.     [name] docusate sodium (COLACE) 100 MG capsule Take 100 mg by mouth 2 (two) times daily.    . finasteride (PROSCAR) 5 MG tablet   0  . irbesartan (AVAPRO) 150 MG tablet Take 1 tablet (150 mg total) by mouth 2 (two) times daily. (Patient taking differently: Take 150 mg by mouth daily. ) 60 tablet 1  . rasagiline (AZILECT) 1 MG TABS tablet Take 1 tablet (1 mg total) by mouth daily. 30 tablet 11  . tamsulosin (FLOMAX) 0.4 MG  CAPS capsule Take 1 capsule (0.4 mg total) by mouth daily after supper. 30 capsule 1  . cephALEXin (KEFLEX) 500 MG capsule Take 1 capsule (500 mg total) by mouth 2 (two) times daily. 40 capsule 0  . Multiple Vitamin (MULTIVITAMIN WITH MINERALS) TABS Take 1 tablet by mouth daily.     Facility-Administered Medications Prior to Visit  Medication Dose Route Frequency Provider Last Rate Last Dose  . mitomycin (MUTAMYCIN) chemo injection 40 mg  40 mg Bladder Instillation Once Festus Aloe, MD        PAST MEDICAL HISTORY: Past Medical History:  Diagnosis Date  . Aneurysm of infrarenal abdominal aorta (HCC) SUPRAILIAC  4CM PER CT 10-20-2010--  STABLE  . Bladder cancer ([place]) RECURRENT  (FIRST DX  2001)   HX TURBT AND CHEMO BLADDER INSTILLATION TX'S  . Frequency of  urination   . Hyperlipidemia   . Hypertension   . Nocturia   . Orthostatic hypotension   . Parkinson disease ([place])   . Peripheral neuropathy ([place])     PAST SURGICAL HISTORY: Past Surgical History:  Procedure Laterality Date  . APPENDECTOMY  10-20-2010  . CYSTO/ BLADDER BX/ FULGERATION BLADDER TUMOR  10-09-2004  . CYSTO/ RETROGRADE PYELOGRAM/ BLADDER BX'S  07-03-2004  . CYSTOSCOPY WITH BIOPSY  11/28/2011   Procedure: CYSTOSCOPY WITH BIOPSY;  Surgeon: Fredricka Bonine, MD;  Location: [facility];  Service: Urology;  Laterality: N/A;  . HIP ARTHROPLASTY Right 04/09/2013   Procedure: ARTHROPLASTY BIPOLAR HIP;  Surgeon: Augustin Schooling, MD;  Location: [place];  Service: Orthopedics;  Laterality: Right;  . LEFT INGUINAL HERNIA REPAIR  1996  . TONSILLECTOMY AND ADENOIDECTOMY  1938  . TRANSURETHRAL RESECTION OF BLADDER TUMOR  09-11-1999   BLADDER CANCER  . VASECTOMY      FAMILY HISTORY: Family History  Problem Relation Age of Onset  . Alzheimer's disease Mother   . Heart failure Father   . Kidney cancer Neg Hx   . Prostate cancer Neg Hx     SOCIAL HISTORY: Social History   Social History  . Marital status: Married    Spouse name: Ivan Clark  . Number of children: 4  . Years of education: college   Occupational History  .      retired   Social History Main Topics  . Smoking status: Former Smoker    Packs/day: 2.00    Years: 50.00    Types: Cigarettes    Quit date: 05/13/1999  . Smokeless tobacco: Never Used  . Alcohol use No  . Drug use: No  . Sexual activity: Not on file   Other Topics Concern  . Not on file   Social History Narrative   Patient lives at [facility] in [place], [place]   Education- college education.   Retired.   Right handed.   Caffeine- Very Little     PHYSICAL EXAM  Vitals:   04/17/16 0920  BP: 120/60  Pulse: 78  Weight: 159 lb (72.1 kg)  Height: 5\' 6"  (1.676 m)   Body mass index is 25.66 kg/m.   PHYSICAL  EXAMNIATION:  Gen: NAD, conversant, well nourised, obese, well groomed                     Cardiovascular: Regular rate rhythm, no peripheral edema, warm, nontender. Eyes: Conjunctivae clear without exudates or hemorrhage Neck: Supple, no carotid bruits. Pulmonary: Clear to auscultation bilaterally   NEUROLOGICAL EXAM:  MENTAL STATUS: Speech:    Mildly slurred speech, fluent  and spontaneous with normal comprehension.  Cognition:     Orientation to time, place and person     Normal recent and remote memory     Normal Attention span and concentration     Normal Language, naming, repeating,spontaneous speech     Fund of knowledge   CRANIAL NERVES: CN II: Visual fields are full to confrontation. Fundoscopic exam is normal with sharp discs and no vascular changes. Pupils are round equal and briskly reactive to light. CN III, IV, VI: extraocular movement are normal. No ptosis. CN V: Facial sensation is intact to pinprick in all 3 divisions bilaterally. Corneal responses are intact.  CN VII: Face is symmetric with normal eye closure and smile. CN VIII: Hearing is normal to rubbing fingers CN IX, X: Palate elevates symmetrically. Phonation is normal. CN XI: Head turning and shoulder shrug are intact CN XII: Tongue is midline with normal movements and no atrophy.  MOTOR: There was no weakness, mild bilateral upper extremity rigidity, severe anterocollis, maximum neck was not able to pass erect position  REFLEXES: Reflexes are 1 and symmetric at the biceps, triceps, knees, and ankles. Plantar responses are flexor.  SENSORY: Intact to light touch, pinprick  COORDINATION: Rapid alternating movements and fine finger movements are intact. There is no dysmetria on finger-to-nose and heel-knee-shin.    GAIT/STANCE: He needs assistance to get up from seated position, rely on his walker, small shuffling mildly unsteady gait, severe anterocollis,   DIAGNOSTIC DATA (LABS, IMAGING,  TESTING) -  ASSESSMENT AND PLAN 82 y.o. year old male Idiopathic Parkinson's disease  Mild dyskinesia on current dose of Sineme, will stop nighttime Sinemet ER 50/200  Increase daytime dose to Sinemet 25/100 one and half tablets 3 times a day  Continue physical therapy Cervical dystonia, severe anterocollis Gait abnormality  Marcial Pacas, M.D. Ph.D.  [facility] [place], [place] [address] Phone: [phone] Fax:      [phone]

## 2016-04-26 DIAGNOSIS — R1310 Dysphagia, unspecified: Secondary | ICD-10-CM | POA: Diagnosis not present

## 2016-04-26 DIAGNOSIS — S72019A Unspecified intracapsular fracture of unspecified femur, initial encounter for closed fracture: Secondary | ICD-10-CM | POA: Diagnosis not present

## 2016-04-26 DIAGNOSIS — G20 Parkinson's disease: Secondary | ICD-10-CM | POA: Diagnosis not present

## 2016-04-28 DIAGNOSIS — Z0000 Encounter for general adult medical examination without abnormal findings: Secondary | ICD-10-CM | POA: Diagnosis not present

## 2016-04-29 DIAGNOSIS — R300 Dysuria: Secondary | ICD-10-CM | POA: Diagnosis not present

## 2016-05-09 DIAGNOSIS — G9009 Other idiopathic peripheral autonomic neuropathy: Secondary | ICD-10-CM | POA: Diagnosis not present

## 2016-05-09 DIAGNOSIS — I129 Hypertensive chronic kidney disease with stage 1 through stage 4 chronic kidney disease, or unspecified chronic kidney disease: Secondary | ICD-10-CM | POA: Diagnosis not present

## 2016-05-09 DIAGNOSIS — G20 Parkinson's disease: Secondary | ICD-10-CM | POA: Diagnosis not present

## 2016-05-09 DIAGNOSIS — N189 Chronic kidney disease, unspecified: Secondary | ICD-10-CM | POA: Diagnosis not present

## 2016-05-09 DIAGNOSIS — M4800 Spinal stenosis, site unspecified: Secondary | ICD-10-CM | POA: Diagnosis not present

## 2016-05-09 DIAGNOSIS — M5010 Cervical disc disorder with radiculopathy, unspecified cervical region: Secondary | ICD-10-CM | POA: Diagnosis not present

## 2016-05-13 DIAGNOSIS — M5416 Radiculopathy, lumbar region: Secondary | ICD-10-CM | POA: Diagnosis not present

## 2016-05-13 DIAGNOSIS — N401 Enlarged prostate with lower urinary tract symptoms: Secondary | ICD-10-CM | POA: Diagnosis not present

## 2016-05-13 DIAGNOSIS — M6281 Muscle weakness (generalized): Secondary | ICD-10-CM | POA: Diagnosis not present

## 2016-05-13 DIAGNOSIS — M25551 Pain in right hip: Secondary | ICD-10-CM | POA: Diagnosis not present

## 2016-05-13 DIAGNOSIS — K5900 Constipation, unspecified: Secondary | ICD-10-CM | POA: Diagnosis not present

## 2016-05-13 DIAGNOSIS — R269 Unspecified abnormalities of gait and mobility: Secondary | ICD-10-CM | POA: Diagnosis not present

## 2016-05-13 DIAGNOSIS — N390 Urinary tract infection, site not specified: Secondary | ICD-10-CM | POA: Diagnosis not present

## 2016-05-13 DIAGNOSIS — G20 Parkinson's disease: Secondary | ICD-10-CM | POA: Diagnosis not present

## 2016-05-27 DIAGNOSIS — R1310 Dysphagia, unspecified: Secondary | ICD-10-CM | POA: Diagnosis not present

## 2016-05-27 DIAGNOSIS — G20 Parkinson's disease: Secondary | ICD-10-CM | POA: Diagnosis not present

## 2016-05-27 DIAGNOSIS — S72019A Unspecified intracapsular fracture of unspecified femur, initial encounter for closed fracture: Secondary | ICD-10-CM | POA: Diagnosis not present

## 2016-06-02 DIAGNOSIS — K5900 Constipation, unspecified: Secondary | ICD-10-CM | POA: Diagnosis not present

## 2016-06-02 DIAGNOSIS — R350 Frequency of micturition: Secondary | ICD-10-CM | POA: Diagnosis not present

## 2016-06-02 DIAGNOSIS — J309 Allergic rhinitis, unspecified: Secondary | ICD-10-CM | POA: Diagnosis not present

## 2016-06-02 DIAGNOSIS — R300 Dysuria: Secondary | ICD-10-CM | POA: Diagnosis not present

## 2016-06-02 DIAGNOSIS — N411 Chronic prostatitis: Secondary | ICD-10-CM | POA: Diagnosis not present

## 2016-06-02 DIAGNOSIS — N390 Urinary tract infection, site not specified: Secondary | ICD-10-CM | POA: Diagnosis not present

## 2016-06-02 DIAGNOSIS — R609 Edema, unspecified: Secondary | ICD-10-CM | POA: Diagnosis not present

## 2016-06-02 DIAGNOSIS — N401 Enlarged prostate with lower urinary tract symptoms: Secondary | ICD-10-CM | POA: Diagnosis not present

## 2016-06-05 DIAGNOSIS — E785 Hyperlipidemia, unspecified: Secondary | ICD-10-CM | POA: Diagnosis not present

## 2016-06-05 DIAGNOSIS — I10 Essential (primary) hypertension: Secondary | ICD-10-CM | POA: Diagnosis not present

## 2016-06-05 DIAGNOSIS — M6281 Muscle weakness (generalized): Secondary | ICD-10-CM | POA: Diagnosis not present

## 2016-06-05 DIAGNOSIS — R05 Cough: Secondary | ICD-10-CM | POA: Diagnosis not present

## 2016-06-05 DIAGNOSIS — R509 Fever, unspecified: Secondary | ICD-10-CM | POA: Diagnosis not present

## 2016-06-05 DIAGNOSIS — E538 Deficiency of other specified B group vitamins: Secondary | ICD-10-CM | POA: Diagnosis not present

## 2016-06-05 DIAGNOSIS — E559 Vitamin D deficiency, unspecified: Secondary | ICD-10-CM | POA: Diagnosis not present

## 2016-06-05 DIAGNOSIS — R269 Unspecified abnormalities of gait and mobility: Secondary | ICD-10-CM | POA: Diagnosis not present

## 2016-06-06 DIAGNOSIS — R509 Fever, unspecified: Secondary | ICD-10-CM | POA: Diagnosis not present

## 2016-06-06 DIAGNOSIS — R05 Cough: Secondary | ICD-10-CM | POA: Diagnosis not present

## 2016-06-06 DIAGNOSIS — R0602 Shortness of breath: Secondary | ICD-10-CM | POA: Diagnosis not present

## 2016-06-09 DIAGNOSIS — E559 Vitamin D deficiency, unspecified: Secondary | ICD-10-CM | POA: Diagnosis not present

## 2016-06-09 DIAGNOSIS — Z79899 Other long term (current) drug therapy: Secondary | ICD-10-CM | POA: Diagnosis not present

## 2016-06-09 DIAGNOSIS — E538 Deficiency of other specified B group vitamins: Secondary | ICD-10-CM | POA: Diagnosis not present

## 2016-06-11 DIAGNOSIS — G629 Polyneuropathy, unspecified: Secondary | ICD-10-CM | POA: Diagnosis not present

## 2016-06-11 DIAGNOSIS — R609 Edema, unspecified: Secondary | ICD-10-CM | POA: Diagnosis not present

## 2016-06-11 DIAGNOSIS — B351 Tinea unguium: Secondary | ICD-10-CM | POA: Diagnosis not present

## 2016-06-11 DIAGNOSIS — M79676 Pain in unspecified toe(s): Secondary | ICD-10-CM | POA: Diagnosis not present

## 2016-06-27 DIAGNOSIS — S72019A Unspecified intracapsular fracture of unspecified femur, initial encounter for closed fracture: Secondary | ICD-10-CM | POA: Diagnosis not present

## 2016-06-27 DIAGNOSIS — G20 Parkinson's disease: Secondary | ICD-10-CM | POA: Diagnosis not present

## 2016-06-27 DIAGNOSIS — R1310 Dysphagia, unspecified: Secondary | ICD-10-CM | POA: Diagnosis not present

## 2016-06-30 DIAGNOSIS — N401 Enlarged prostate with lower urinary tract symptoms: Secondary | ICD-10-CM | POA: Diagnosis not present

## 2016-06-30 DIAGNOSIS — N411 Chronic prostatitis: Secondary | ICD-10-CM | POA: Diagnosis not present

## 2016-06-30 DIAGNOSIS — K5900 Constipation, unspecified: Secondary | ICD-10-CM | POA: Diagnosis not present

## 2016-06-30 DIAGNOSIS — R197 Diarrhea, unspecified: Secondary | ICD-10-CM | POA: Diagnosis not present

## 2016-07-09 ENCOUNTER — Other Ambulatory Visit: Payer: Self-pay | Admitting: Neurology

## 2016-07-09 ENCOUNTER — Ambulatory Visit
Admission: RE | Admit: 2016-07-09 | Discharge: 2016-07-09 | Disposition: A | Discharge status: Home / Self Care | Payer: Medicare Other | Source: Ambulatory Visit | Attending: Neurology | Admitting: Neurology

## 2016-07-09 ENCOUNTER — Ambulatory Visit (INDEPENDENT_AMBULATORY_CARE_PROVIDER_SITE_OTHER): Payer: Medicare Other | Admitting: Neurology

## 2016-07-09 ENCOUNTER — Encounter: Payer: Self-pay | Admitting: Neurology

## 2016-07-09 VITALS — BP 188/88 | HR 80 | Ht 66.0 in | Wt 158.0 lb

## 2016-07-09 DIAGNOSIS — G243 Spasmodic torticollis: Secondary | ICD-10-CM | POA: Insufficient documentation

## 2016-07-09 DIAGNOSIS — K117 Disturbances of salivary secretion: Secondary | ICD-10-CM

## 2016-07-09 DIAGNOSIS — G218 Other secondary parkinsonism: Secondary | ICD-10-CM

## 2016-07-09 DIAGNOSIS — R296 Repeated falls: Secondary | ICD-10-CM

## 2016-07-09 DIAGNOSIS — S299XXA Unspecified injury of thorax, initial encounter: Secondary | ICD-10-CM | POA: Diagnosis not present

## 2016-07-09 DIAGNOSIS — R079 Chest pain, unspecified: Secondary | ICD-10-CM | POA: Diagnosis not present

## 2016-07-09 DIAGNOSIS — R0781 Pleurodynia: Secondary | ICD-10-CM | POA: Diagnosis not present

## 2016-07-09 MED ORDER — RASAGILINE MESYLATE 1 MG PO TABS: 1.0000 mg | ORAL_TABLET | Freq: Every day | ORAL | 4 refills | Status: DC

## 2016-07-09 NOTE — Progress Notes (Signed)
[facility]  PATIENT: Ivan Clark DOB: [birth year]  HISTORY OF PRESENT ILLNESS:Ivan Clark is 83 years old male, accompanied by his wife, initially seen in consultation by his primary care physician [name] for cervical dystonia, mild parkinsonian features. He has past medical history of bladder cancer, hypertension, prostate hypertrophy, over the past 20 years, he also developed gradual onset of anterocollois. Since 2012, wife noticed that he has slowed down significantly, had difficulty initiate gait, small shuffling gait, occasionally bilateral hands tremor, he also developed frequent lightheadedness, happen when he gets up quickly from sitting position, no passing out, consistent with a orthostatic blood pressure changes. He also has chronic constipation, acting out of dreams, loss of sense of smell for a long time period. He contributed to his loss of smell to his long time smoking history, he quit smoking 11 years ago, he used to smoke 2 packs a day. MRI of brain in August 2011: mild atrophy,small vessel disease, no acute lesions. MRA of brain was normal On examination he has mild parkinsonian features, Sinemet 25/100 3 times a day since 2012, he tolerated the medication well, there is mild improvement in his gait, he underwent emergency appendectomy in June 2013, recovered well from his surgery. He denies memory trouble. Selegiline since 04/2012, did very well, but had few episodes of dizziness,   Fainting spell in March 5th 2014, he got up early morning, around 5:45 AM, he was in bathroom, he felt funny after taking few steps, landed on the floor, he was taken to [name], was found to have orthostatic hypotension, was sent to the emergency room, reported normal CT scan. He continues to complain of lightheadedness when standing up quickly,  He began to receive EMG guided butulinum toxin injection for his anterocollis and sialorrhea since March 19th  2014, the first one was in March 2014, the second injection was in July 2nd 2014, each time, he has mild improvement in straighting up his neck better, so he can walk better.  The third injection was October 10th 2014 , the same amount of xeomin 100 units total, 50 units to sternocleidal mastoid muscles, 5 days after the injection, he began to notice difficulty swallowing, especially with liquid, and hard solid food, eventually was admitted to hospital in February 28, 2013 swallowing study has demonstrated moderate pharyngeal and cervical esophageal dysphagia. He require NG tube for one day, dysphagia gradually resolved  He fell in April 09, 2013, with right hip fracture, he had a right hemiarthroplasty, recovered very well, is ambulating without assistance again, He is taking Sinemet IR 25/100 3 times a day, Sinemet CR 50/200 one tablet every night. He has no significant memory trouble, mild REM sleep disorder   UPDATE Apr 21 2014: He is doing good, exercise regularly with a personal trainier twice a week, stretching his legs, he continue have gradual worsening neck flexion forward, denies significant swallowing difficulty, has occasionally choking episode on certain texture of food, such as grapes skin, he still eats regular food, drinking water without difficulty Over the past few months, he noticed early morning calf muscle cramping, worsening low back pain, bilateral feet numbness, no bowel and bladder incontinence. UPDATE June 8th 2016: I have reviewed consult note from movement specialist [name] in March 2016, who concurred the diagnosis of mild Parkinson's disease, cervical dystonia, agree on current management of Sinemet immediate release 25/100 mg 3 times a day, Sinemet CR 50/200 mg once every night He has mild trouble  swallowing, with big chunk solid food, he is still able to eat regular food, acting out of dreams, worsening anterocollis, still able to ambulate without assistance   His wife is using 24-hour oxygen now because of bronchiectasis   UPDATE 12/08/2016CM [name], 83 year old male returns for follow-up. He has a history of mild Parkinson's disease and cervical dystonia and mild dysphagia. He can chock on certain foods such as grapes and he tries to avoid those. He has no problems with liquids. He denies any recent falls, he uses a cane for ambulation. He does some stretching exercises. He does not perform any neck exercises. He reports that his memory is good, he is currently on Sinemet extended release at night and regular release 3 times a day before meals, he is also on Azilect. He returns for reevaluation. UPDATE 6/7/17CM  [name] 83 year old male returns for follow-up. He has a history of Parkinson's disease and cervical dystonia and mild dysphagia. Since last seen 6 months ago he has several falls with MRI from orthopedist in [place]  (epidural steroid 09-06-15 spinal nerve root block 2 wks ago5-23-17))  he has moved from his private home to living at [facility] in [place], [place].  Has [facility] orders to be signed for a hospital bed in a wheelchair. He reports that his appetite is better and he is chocking less. He has been set up for some physical therapy but that has not started. He reports no problems with his memory. He remains on Sinemet and Azilect. Minimal tremor seen today. 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  He is going physical therapy.  His legs feel like magnet, now walking with a walker.   He is taking Sinemet 25/100 3 times a day, carbidopa levodopa ER 50/200 one tablet at bedtime, Azilect 1 mg daily, he was noted to have tongue protrusion, mild dyskinesia  He sleeps on his back, sleeps well.  UPDATE Jul 09 2016: He fell 3 times since 05-20-16, most recent was on July 07 2016, he finished using bathroom, standing up washing his hand,  without clear triggers, he fell backwards, landed on his back, now with right side chest pain, and sternal pain upon deep breathing,  He is taking Sinemet 25/100 mg one and half tablets 3 times a day, Azilect 1 mg daily, he tolerates the medication well,  Today he was found to have irregular heart rhythm,  REVIEW OF SYSTEMS: Full 14 system review of systems performed and notable only for those listed, all others are neg:  As above  ALLERGIES: No Known Allergies  HOME MEDICATIONS: Outpatient Medications Prior to Visit  Medication Sig Dispense Refill  . acetaminophen (TYLENOL) 500 MG tablet Take 500 mg by mouth as needed.    [name] aspirin EC 81 MG tablet Take 1 tablet (81 mg total) by mouth daily after supper.    [name] atorvastatin (LIPITOR) 20 MG tablet Take 1 tablet (20 mg total) by mouth daily. 30 tablet 1  . carbidopa-levodopa (SINEMET IR) 25-100 MG tablet 1.5 tablets at 8, 12, 6:30pm 420 tablet 4  . cholecalciferol (VITAMIN D) 1000 units tablet Take 2,000 Units by mouth daily.     [name] docusate sodium (COLACE) 100 MG capsule Take 100 mg by mouth 2 (two) times daily.    . finasteride (PROSCAR) 5 MG tablet   0  . irbesartan (AVAPRO) 150 MG tablet Take  1 tablet (150 mg total) by mouth 2 (two) times daily. (Patient taking differently: Take 150 mg by mouth daily. ) 60 tablet 1  . naproxen sodium (ANAPROX) 220 MG tablet Take 220 mg by mouth as needed.    Vladimir Faster Glycol-Propyl Glycol (SYSTANE) 0.4-0.3 % SOLN Apply to eye as needed.    . rasagiline (AZILECT) 1 MG TABS tablet Take 1 tablet (1 mg total) by mouth daily. 30 tablet 11  . tamsulosin (FLOMAX) 0.4 MG CAPS capsule Take 1 capsule (0.4 mg total) by mouth daily after supper. 30 capsule 1  . amLODipine (NORVASC) 10 MG tablet Take 1 tablet (10 mg total) by mouth daily. (Patient taking differently: Take 5 mg by mouth daily. ) 30 tablet 1   Facility-Administered Medications Prior to Visit  Medication Dose Route Frequency Provider Last Rate Last  Dose  . mitomycin (MUTAMYCIN) chemo injection 40 mg  40 mg Bladder Instillation Once Festus Aloe, MD        PAST MEDICAL HISTORY: Past Medical History:  Diagnosis Date  . Aneurysm of infrarenal abdominal aorta (HCC) SUPRAILIAC  4CM PER CT 10-20-2010--  STABLE  . Bladder cancer ([place]) RECURRENT  (FIRST DX  2001)   HX TURBT AND CHEMO BLADDER INSTILLATION TX'S  . Frequency of urination   . Hyperlipidemia   . Hypertension   . Nocturia   . Orthostatic hypotension   . Parkinson disease ([place])   . Peripheral neuropathy ([place])     PAST SURGICAL HISTORY: Past Surgical History:  Procedure Laterality Date  . APPENDECTOMY  10-20-2010  . CYSTO/ BLADDER BX/ FULGERATION BLADDER TUMOR  10-09-2004  . CYSTO/ RETROGRADE PYELOGRAM/ BLADDER BX'S  07-03-2004  . CYSTOSCOPY WITH BIOPSY  11/28/2011   Procedure: CYSTOSCOPY WITH BIOPSY;  Surgeon: Fredricka Bonine, MD;  Location: [facility];  Service: Urology;  Laterality: N/A;  . HIP ARTHROPLASTY Right 04/09/2013   Procedure: ARTHROPLASTY BIPOLAR HIP;  Surgeon: Augustin Schooling, MD;  Location: [facility];  Service: Orthopedics;  Laterality: Right;  . LEFT INGUINAL HERNIA REPAIR  1996  . TONSILLECTOMY AND ADENOIDECTOMY  1938  . TRANSURETHRAL RESECTION OF BLADDER TUMOR  09-11-1999   BLADDER CANCER  . VASECTOMY      FAMILY HISTORY: Family History  Problem Relation Age of Onset  . Alzheimer's disease Mother   . Heart failure Father   . Kidney cancer Neg Hx   . Prostate cancer Neg Hx     SOCIAL HISTORY: Social History   Social History  . Marital status: Married    Spouse name: Katharine Look  . Number of children: 4  . Years of education: college   Occupational History  .      retired   Social History Main Topics  . Smoking status: Former Smoker    Packs/day: 2.00    Years: 50.00    Types: Cigarettes    Quit date: 05/13/1999  . Smokeless tobacco: Never Used  . Alcohol use No  . Drug use: No  . Sexual activity: Not on file    Other Topics Concern  . Not on file   Social History Narrative   Patient lives at [facility] in [place], [place]   Education- college education.   Retired.   Right handed.   Caffeine- Very Little     PHYSICAL EXAM  Vitals:   07/09/16 0828  BP: (!) 188/88  Pulse: 80  Weight: 158 lb (71.7 kg)  Height: 5\' 6"  (1.676 m)   Body mass index is 25.5  kg/m.   PHYSICAL EXAMNIATION:  Gen: NAD, conversant, well nourised, obese, well groomed                     Cardiovascular: Regular rate rhythm, no peripheral edema, warm, nontender. Eyes: Conjunctivae clear without exudates or hemorrhage Neck: Supple, no carotid bruits. Pulmonary: Clear to auscultation bilaterally   NEUROLOGICAL EXAM:  MENTAL STATUS: Speech:    Mildly slurred speech, fluent and spontaneous with normal comprehension.  Cognition:     Orientation to time, place and person     Normal recent and remote memory     Normal Attention span and concentration     Normal Language, naming, repeating,spontaneous speech     Fund of knowledge   CRANIAL NERVES: CN II: Visual fields are full to confrontation.  Pupils are round equal and briskly reactive to light. CN III, IV, VI: extraocular movement are normal. No ptosis. CN V: Facial sensation is intact to pinprick in all 3 divisions bilaterally. Corneal responses are intact.  CN VII: Face is symmetric with normal eye closure and smile. CN VIII: Hearing is normal to rubbing fingers CN IX, X: Palate elevates symmetrically. Phonation is normal. CN XI: Head turning and shoulder shrug are intact CN XII: Tongue is midline with normal movements and no atrophy.  MOTOR: There was no weakness, mild bilateral upper extremity rigidity, right worse than left, severe anterocollis, maximum neck was not able to pass erect position  REFLEXES: Reflexes are 1 and symmetric at the biceps, triceps, knees, and ankles. Plantar responses are flexor.  SENSORY: Intact to light touch,  pinprick  COORDINATION: Rapid alternating movements and fine finger movements are intact. There is no dysmetria on finger-to-nose and heel-knee-shin.    GAIT/STANCE: He needs assistance to get up from seated position, rely on his walker, small shuffling mildly unsteady gait, severe anterocollis, retropulsed instability  DIAGNOSTIC DATA (LABS, IMAGING, TESTING) -  ASSESSMENT AND PLAN 83 y.o. year old male Idiopathic Parkinson's disease  Keep current dose of Sinemet 25/100 mg 1 half tablet 3 times a day  Continue physical therapy   Refilled his Azilect 1mg  daily Cervical dystonia, severe anterocollis Gait abnormality, frequent falling Cardiac arrhythmia  Refer him to cardiologist  Marcial Pacas, M.D. Ph.D.  [facility] [address] [place], [place] [address] Phone: [phone] Fax:      [phone]

## 2016-07-11 ENCOUNTER — Telehealth: Payer: Self-pay | Admitting: Neurology

## 2016-07-11 NOTE — Telephone Encounter (Signed)
I called his daughter, x-ray of chest showed no rib fracture. The result was sent in to my chart as well.

## 2016-07-11 NOTE — Telephone Encounter (Signed)
Patients daughter Ivan Clark (listed on DPR) called office requesting results for Xray results.  Please call

## 2016-07-14 DIAGNOSIS — R269 Unspecified abnormalities of gait and mobility: Secondary | ICD-10-CM | POA: Diagnosis not present

## 2016-07-14 DIAGNOSIS — G20 Parkinson's disease: Secondary | ICD-10-CM | POA: Diagnosis not present

## 2016-07-14 DIAGNOSIS — R079 Chest pain, unspecified: Secondary | ICD-10-CM | POA: Diagnosis not present

## 2016-07-14 DIAGNOSIS — I10 Essential (primary) hypertension: Secondary | ICD-10-CM | POA: Diagnosis not present

## 2016-07-14 DIAGNOSIS — I499 Cardiac arrhythmia, unspecified: Secondary | ICD-10-CM | POA: Diagnosis not present

## 2016-07-16 ENCOUNTER — Ambulatory Visit (INDEPENDENT_AMBULATORY_CARE_PROVIDER_SITE_OTHER): Payer: Medicare Other | Admitting: Cardiology

## 2016-07-16 ENCOUNTER — Encounter: Payer: Self-pay | Admitting: Cardiology

## 2016-07-16 VITALS — BP 134/76 | HR 81 | Ht 66.0 in | Wt 161.2 lb

## 2016-07-16 DIAGNOSIS — I499 Cardiac arrhythmia, unspecified: Secondary | ICD-10-CM | POA: Diagnosis not present

## 2016-07-16 DIAGNOSIS — R079 Chest pain, unspecified: Secondary | ICD-10-CM

## 2016-07-16 DIAGNOSIS — I10 Essential (primary) hypertension: Secondary | ICD-10-CM | POA: Diagnosis not present

## 2016-07-16 DIAGNOSIS — E7849 Other hyperlipidemia: Secondary | ICD-10-CM

## 2016-07-16 DIAGNOSIS — E784 Other hyperlipidemia: Secondary | ICD-10-CM

## 2016-07-16 NOTE — Progress Notes (Signed)
Cardiology Office Note   Date:  07/16/2016   ID:  Ivan Clark, DOB 10-19-1928, MRN [id number]  Referring Doctor:  [facility]   Cardiologist:   Wende Bushy, MD   Reason for consultation:  Chief Complaint  Patient presents with  . other    Ref by Dr. Krista Blue for rapid irreg. heart beats, HTN. Meds reviewed by the pt.'s med list.       History of Present Illness: Ivan Clark is a 83 y.o. male who presents for Noted to have an irregular rhythm on a routine visit to the neurologist.  Patient does not really report any palpitations, fluttering.  At baseline, he has very limited mobility due to his Parkinson's and uses a walker for stability. He does not complain of shortness of breath but he does complain of chest pains in the center of the chest, randomly occurring, mild to moderate in severity, sharp feeling, nonradiating, sometimes with exertion. This has been going on for years but has never really had it checked.  Patient denies PND, orthopnea, edema.   ROS:  Please see the history of present illness. Aside from mentioned under HPI, all other systems are reviewed and negative.     Past Medical History:  Diagnosis Date  . Aneurysm of infrarenal abdominal aorta (HCC) SUPRAILIAC  4CM PER CT 10-20-2010--  STABLE  . Bladder cancer ([place]) RECURRENT  (FIRST DX  2001)   HX TURBT AND CHEMO BLADDER INSTILLATION TX'S  . Frequency of urination   . Hyperlipidemia   . Hypertension   . Nocturia   . Orthostatic hypotension   . Parkinson disease ([place])   . Peripheral neuropathy [facility])     Past Surgical History:  Procedure Laterality Date  . APPENDECTOMY  10-20-2010  . CYSTO/ BLADDER BX/ FULGERATION BLADDER TUMOR  10-09-2004  . CYSTO/ RETROGRADE PYELOGRAM/ BLADDER BX'S  07-03-2004  . CYSTOSCOPY WITH BIOPSY  11/28/2011   Procedure: CYSTOSCOPY WITH BIOPSY;  Surgeon: Fredricka Bonine, MD;  Location: [facility];  Service: Urology;   Laterality: N/A;  . HIP ARTHROPLASTY Right 04/09/2013   Procedure: ARTHROPLASTY BIPOLAR HIP;  Surgeon: Augustin Schooling, MD;  Location: [facility];  Service: Orthopedics;  Laterality: Right;  . LEFT INGUINAL HERNIA REPAIR  1996  . TONSILLECTOMY AND ADENOIDECTOMY  1938  . TRANSURETHRAL RESECTION OF BLADDER TUMOR  09-11-1999   BLADDER CANCER  . VASECTOMY       reports that he quit smoking about 17 years ago. His smoking use included Cigarettes. He has a 100.00 pack-year smoking history. He has never used smokeless tobacco. He reports that he does not drink alcohol or use drugs.   family history includes Alzheimer's disease in his mother; Heart failure in his father.   Outpatient Medications Prior to Visit  Medication Sig Dispense Refill  . acetaminophen (TYLENOL) 500 MG tablet Take 500 mg by mouth as needed.    [name] amLODipine (NORVASC) 5 MG tablet Take 5 mg by mouth daily.  4  . aspirin EC 81 MG tablet Take 1 tablet (81 mg total) by mouth daily after supper.    [name] atorvastatin (LIPITOR) 20 MG tablet Take 1 tablet (20 mg total) by mouth daily. 30 tablet 1  . carbidopa-levodopa (SINEMET IR) 25-100 MG tablet 1.5 tablets at 8, 12, 6:30pm 420 tablet 4  . cholecalciferol (VITAMIN D) 1000 units tablet Take 2,000 Units by mouth daily.     [name] docusate sodium (COLACE) 100 MG capsule Take 100  mg by mouth 2 (two) times daily.    . finasteride (PROSCAR) 5 MG tablet   0  . fluticasone (FLONASE) 50 MCG/ACT nasal spray 1 spray daily as needed.  0  . irbesartan (AVAPRO) 150 MG tablet Take 1 tablet (150 mg total) by mouth 2 (two) times daily. (Patient taking differently: Take 150 mg by mouth daily. ) 60 tablet 1  . naproxen sodium (ANAPROX) 220 MG tablet Take 220 mg by mouth as needed.    Vladimir Faster Glycol-Propyl Glycol (SYSTANE) 0.4-0.3 % SOLN Apply to eye as needed.    . rasagiline (AZILECT) 1 MG TABS tablet Take 1 tablet (1 mg total) by mouth daily. 90 tablet 4  . tamsulosin (FLOMAX) 0.4 MG CAPS capsule Take 1  capsule (0.4 mg total) by mouth daily after supper. 30 capsule 1   Facility-Administered Medications Prior to Visit  Medication Dose Route Frequency Provider Last Rate Last Dose  . mitomycin (MUTAMYCIN) chemo injection 40 mg  40 mg Bladder Instillation Once Festus Aloe, MD         Allergies: Patient has no known allergies.    PHYSICAL EXAM: VS:  BP 134/76 (BP Location: Right Arm, Patient Position: Sitting, Cuff Size: Normal)   Pulse 81   Ht 5\' 6"  (1.676 m)   Wt 161 lb 4 oz (73.1 kg)   BMI 26.03 kg/m  , Body mass index is 26.03 kg/m. Wt Readings from Last 3 Encounters:  07/16/16 161 lb 4 oz (73.1 kg)  07/09/16 158 lb (71.7 kg)  04/17/16 159 lb (72.1 kg)    GENERAL:  well developed, well nourished, not in acute distress HEENT: normocephalic, pink conjunctivae, anicteric sclerae, no xanthelasma, normal dentition, oropharynx clear NECK:  no neck vein engorgement, JVP normal, no hepatojugular reflux, carotid upstroke brisk and symmetric, no bruit, no thyromegaly, no lymphadenopathy LUNGS:  good respiratory effort, clear to auscultation bilaterally CV:  PMI not displaced, no thrills, no lifts, S1 and S2 within normal limits, no palpable S3 or S4, no murmurs, no rubs, no gallops ABD:  Soft, nontender, nondistended, normoactive bowel sounds, no abdominal aortic bruit, no hepatomegaly, no splenomegaly MS: nontender back, no kyphosis, no scoliosis, no joint deformities EXT:  2+ DP/PT pulses, no edema, no varicosities, no cyanosis, no clubbing SKIN: warm, nondiaphoretic, normal turgor, no ulcers NEUROPSYCH: alert, oriented to person, place, and time, normal mood, appropriate affect  Recent Labs: No results found for requested labs within last 8760 hours.   Lipid Panel No results found for: CHOL, TRIG, HDL, CHOLHDL, VLDL, LDLCALC, LDLDIRECT   Other studies Reviewed:  EKG:  The ekg from 07/16/2016 was personally reviewed by me and it revealed sinus rhythm, 81 BPM. Poor R-wave  progression.  Additional studies/ records that were reviewed personally reviewed by me today include:  Echo 01/04/2010: Mild LVH with impaired relaxation Aortic valve sclerosis  LV systolic function normal  ASSESSMENT AND PLAN: Irregular rhythm noted on physical examination per the neurologist Recommend objective evaluation with long term monitor  Chest pain Poor R-wave progression on EKG. Risk factors for CAD include advanced age, hypertension, hyperlipidemia. We discussed possible workup of this chest pain and limitations of the stress test, being need for patient to lay still under the gamma camera to avoid artifact. Patient verbalized understanding and would like to proceed with the workup. He should continue medical therapy with aspirin, blood pressure control, statin therapy.  HTN BP is well controlled. Continue monitoring BP. Continue current medical therapy and lifestyle changes.  Hyperlipidemia Patient  is on statin therapy per PCP. PCP following labs.  Current medicines are reviewed at length with the patient today.  The patient does not have concerns regarding medicines.  Labs/ tests ordered today include:  Orders Placed This Encounter  Procedures  . NM Myocar Multi W/Spect W/Wall Motion / EF  . LONG TERM MONITOR (3-14 DAYS)  . EKG 12-Lead    I had a lengthy and detailed discussion with the patient regarding diagnoses, prognosis, diagnostic options.   Disposition:   FU with undersigned after tests  Thank you for this consultation. We will forwarding this consultation to referring physician.   Signed, Wende Bushy, MD  07/16/2016 4:53 PM    [place]  This note was generated in part with voice recognition software and I apologize for any typographical errors that were not detected and corrected.

## 2016-07-16 NOTE — Patient Instructions (Addendum)
Testing/Procedures: Your physician has recommended that you wear a Zio monitor. Zio monitors are medical devices that record the heart's electrical activity. Doctors most often [place] these monitors to diagnose arrhythmias. Arrhythmias are problems with the speed or rhythm of the heartbeat. The monitor is a small, portable device. You can wear one while you do your normal daily activities. This is usually used to diagnose what is causing palpitations/syncope (passing out).  [place]  Your caregiver has ordered a Stress Test with nuclear imaging. The purpose of this test is to evaluate the blood supply to your heart muscle. This procedure is referred to as a "Non-Invasive Stress Test." This is because other than having an IV started in your vein, nothing is inserted or "invades" your body. Cardiac stress tests are done to find areas of poor blood flow to the heart by determining the extent of coronary artery disease (CAD). Some patients exercise on a treadmill, which naturally increases the blood flow to your heart, while others who are  unable to walk on a treadmill due to physical limitations have a pharmacologic/chemical stress agent called Lexiscan . This medicine will mimic walking on a treadmill by temporarily increasing your coronary blood flow.   Please note: these test may take anywhere between 2-4 hours to complete  PLEASE REPORT TO [facility] AT THE FIRST DESK WILL DIRECT YOU WHERE TO GO  Date of Procedure:__Friday July 25, 2016 at 07:30AM__  Arrival Time for Procedure:__Arrive at 07:15AM to register__   PLEASE NOTIFY THE OFFICE AT LEAST 24 HOURS IN ADVANCE IF YOU ARE UNABLE TO [place].  [phone] AND  PLEASE NOTIFY NUCLEAR MEDICINE AT [facility] AT LEAST 24 HOURS IN ADVANCE IF YOU ARE UNABLE TO KEEP YOUR APPOINTMENT. [phone]  How to prepare for your Myoview test:  1. Do not eat or drink after midnight 2. No caffeine for 24 hours  prior to test 3. No smoking 24 hours prior to test. 4. Your medication may be taken with water.  If your doctor stopped a medication because of this test, do not take that medication. 5. Ladies, please do not wear dresses.  Skirts or pants are appropriate. Please wear a short sleeve shirt. 6. No perfume, cologne or lotion. 7. Wear comfortable walking shoes. No heels!   Follow-Up: Your physician recommends that you schedule a follow-up appointment as needed. We will call you with results and if needed schedule follow up at that time.   It was a pleasure seeing you today here in the office. Please do not hesitate to give [place] a call back if you have any further questions. [place], BSN     Cardiac Event Monitoring A cardiac event monitor is a small recording device that is used to detect abnormal heart rhythms (arrhythmias). The monitor is used to record your heart rhythm when you have symptoms, such as:  Fast heartbeats (palpitations), such as heart racing or fluttering.  Dizziness.  Fainting or light-headedness.  Unexplained weakness. Some monitors are wired to electrodes placed on your chest. Electrodes are flat, sticky disks that attach to your skin. Other monitors may be hand-held or worn on the wrist. The monitor can be worn for up to 30 days. If the monitor is attached to your chest, a technician will prepare your chest for the electrode placement and show you how to work the monitor. Take time to practice using the monitor before you leave the office. Make sure you  understand how to send the information from the monitor to your health care provider. In some cases, you may need to use a landline telephone instead of a cell phone. What are the risks? Generally, this device is safe to use, but it possible that the skin under the electrodes will become irritated. How to use your cardiac event monitor  Wear your monitor at all times, except when you are in  water:  Do not let the monitor get wet.  Take the monitor off when you bathe. Do not swim or use a hot tub with it on.  Keep your skin clean. Do not put body lotion or moisturizer on your chest.  Change the electrodes as told by your health care provider or any time they stop sticking to your skin. You may need to use medical tape to keep them on.  Try to put the electrodes in slightly different places on your chest to help prevent skin irritation. They must remain in the area under your left breast and in the upper right section of your chest.  Make sure the monitor is safely clipped to your clothing or in a location close to your body that your health care provider recommends.  Press the button to record as soon as you feel heart-related symptoms, such as:  Dizziness.  Weakness.  Light-headedness.  Palpitations.  Thumping or pounding in your chest.  Shortness of breath.  Unexplained weakness.  Keep a diary of your activities, such as walking, doing chores, and taking medicine. It is very important to note what you were doing when you pushed the button to record your symptoms. This will help your health care provider determine what might be contributing to your symptoms.  Send the recorded information as recommended by your health care provider. It may take some time for your health care provider to process the results.  Change the batteries as told by your health care provider.  Keep electronic devices away from your monitor. This includes:  Tablets.  MP3 players.  Cell phones.  While wearing your monitor you should avoid:  Electric blankets.  Armed forces operational officer.  Electric toothbrushes.  Microwave ovens.  Magnets.  Metal detectors. Get help right away if:  You have chest pain.  You have extreme difficulty breathing or shortness of breath.  You develop a very fast heartbeat that persists.  You develop dizziness that does not go away.  You faint or  constantly feel like you are about to faint. Summary  A cardiac event monitor is a small recording device that is used to help detect abnormal heart rhythms (arrhythmias).  The monitor is used to record your heart rhythm when you have heart-related symptoms.  Make sure you understand how to send the information from the monitor to your health care provider.  It is important to press the button on the monitor when you have any heart-related symptoms.  Keep a diary of your activities, such as walking, doing chores, and taking medicine. It is very important to note what you were doing when you pushed the button to record your symptoms. This will help your health care provider learn what might be causing your symptoms. This information is not intended to replace advice given to you by your health care provider. Make sure you discuss any questions you have with your health care provider. Document Released: 02/05/2008 Document Revised: 04/12/2016 Document Reviewed: 04/12/2016 Elsevier Interactive Patient Education  2017 [employer]. Pharmacologic Stress Electrocardiogram Introduction A pharmacologic stress electrocardiogram is  a heart (cardiac) test that uses nuclear imaging to evaluate the blood supply to your heart. This test may also be called a pharmacologic stress electrocardiography. Pharmacologic means that a medicine is used to increase your heart rate and blood pressure. This stress test is done to find areas of poor blood flow to the heart by determining the extent of coronary artery disease (CAD). Some people exercise on a treadmill, which naturally increases the blood flow to the heart. For those people unable to exercise on a treadmill, a medicine is used. This medicine stimulates your heart and will cause your heart to beat harder and more quickly, as if you were exercising. Pharmacologic stress tests can help determine:  The adequacy of blood flow to your heart during increased levels  of activity in order to clear you for discharge home.  The extent of coronary artery blockage caused by CAD.  Your prognosis if you have suffered a heart attack.  The effectiveness of cardiac procedures done, such as an angioplasty, which can increase the circulation in your coronary arteries.  Causes of chest pain or pressure. LET [facility] CARE PROVIDER KNOW ABOUT:  Any allergies you have.  All medicines you are taking, including vitamins, herbs, eye drops, creams, and over-the-counter medicines.  Previous problems you or members of your family have had with the use of anesthetics.  Any blood disorders you have.  Previous surgeries you have had.  Medical conditions you have.  Possibility of pregnancy, if this applies.  If you are currently breastfeeding. RISKS AND COMPLICATIONS Generally, this is a safe procedure. However, as with any procedure, complications can occur. Possible complications include:  You develop pain or pressure in the following areas:  Chest.  Jaw or neck.  Between your shoulder blades.  Radiating down your left arm.  Headache.  Dizziness or light-headedness.  Shortness of breath.  Increased or irregular heartbeat.  Low blood pressure.  Nausea or vomiting.  Flushing.  Redness going up the arm and slight pain during injection of medicine.  Heart attack (rare). BEFORE THE PROCEDURE  Avoid all forms of caffeine for 24 hours before your test or as directed by your health care provider. This includes coffee, tea (even decaffeinated tea), caffeinated sodas, chocolate, cocoa, and certain pain medicines.  Follow your health care provider's instructions regarding eating and drinking before the test.  Take your medicines as directed at regular times with water unless instructed otherwise. Exceptions may include:  If you have diabetes, ask how you are to take your insulin or pills. It is common to adjust insulin dosing the morning of the  test.  If you are taking beta-blocker medicines, it is important to talk to your health care provider about these medicines well before the date of your test. Taking beta-blocker medicines may interfere with the test. In some cases, these medicines need to be changed or stopped 24 hours or more before the test.  If you wear a nitroglycerin patch, it may need to be removed prior to the test. Ask your health care provider if the patch should be removed before the test.  If you use an inhaler for any breathing condition, bring it with you to the test.  If you are an outpatient, bring a snack so you can eat right after the stress phase of the test.  Do not smoke for 4 hours prior to the test or as directed by your health care provider.  Do not apply lotions, powders, creams, or oils on  your chest prior to the test.  Wear comfortable shoes and clothing. Let your health care provider know if you were unable to complete or follow the preparations for your test. PROCEDURE  Multiple patches (electrodes) will be put on your chest. If needed, small areas of your chest may be shaved to get better contact with the electrodes. Once the electrodes are attached to your body, multiple wires will be attached to the electrodes, and your heart rate will be monitored.  An IV access will be started. A nuclear trace (isotope) is given. The isotope may be given intravenously, or it may be swallowed. Nuclear refers to several types of radioactive isotopes, and the nuclear isotope lights up the arteries so that the nuclear images are clear. The isotope is absorbed by your body. This results in low radiation exposure.  A resting nuclear image is taken to show how your heart functions at rest.  A medicine is given through the IV access.  A second scan is done about 1 hour after the medicine injection and determines how your heart functions under stress.  During this stress phase, you will be connected to an  electrocardiogram machine. Your blood pressure and oxygen levels will be monitored. What to expect after the procedure  Your heart rate and blood pressure will be monitored after the test.  You may return to your normal schedule, including diet,activities, and medicines, unless your health care provider tells you otherwise. This information is not intended to replace advice given to you by your health care provider. Make sure you discuss any questions you have with your health care provider. Document Released: 09/14/2008 Document Revised: 10/04/2015 Document Reviewed: 11/05/2015 Elsevier Interactive Patient Education  2017 [employer].

## 2016-07-25 ENCOUNTER — Encounter
Admission: RE | Admit: 2016-07-25 | Discharge: 2016-07-25 | Disposition: A | Discharge status: Home / Self Care | Payer: Medicare Other | Source: Ambulatory Visit | Attending: Cardiology | Admitting: Cardiology

## 2016-07-25 DIAGNOSIS — R1310 Dysphagia, unspecified: Secondary | ICD-10-CM | POA: Diagnosis not present

## 2016-07-25 DIAGNOSIS — R079 Chest pain, unspecified: Secondary | ICD-10-CM | POA: Diagnosis not present

## 2016-07-25 DIAGNOSIS — G20 Parkinson's disease: Secondary | ICD-10-CM | POA: Diagnosis not present

## 2016-07-25 DIAGNOSIS — S72019A Unspecified intracapsular fracture of unspecified femur, initial encounter for closed fracture: Secondary | ICD-10-CM | POA: Diagnosis not present

## 2016-07-25 LAB — NM MYOCAR MULTI W/SPECT W/WALL MOTION / EF
CHL CUP MPHR: 136 {beats}/min
CHL CUP NUCLEAR SDS: 1
CHL CUP RESTING HR STRESS: 73 {beats}/min
CHL CUP STRESS STAGE 1 HR: 89 {beats}/min
CHL CUP STRESS STAGE 1 SPEED: 0 mph
CHL CUP STRESS STAGE 2 SPEED: 0 mph
CHL CUP STRESS STAGE 3 SPEED: 0 mph
CHL CUP STRESS STAGE 4 GRADE: 0 %
CHL CUP STRESS STAGE 4 HR: 93 {beats}/min
CHL CUP STRESS STAGE 4 SPEED: 0 mph
CHL CUP STRESS STAGE 5 GRADE: 0 %
CHL CUP STRESS STAGE 5 SBP: 146 mmHg
CHL CUP STRESS STAGE 5 SPEED: 0 mph
CSEPEDS: 0 s
CSEPEW: 1 METS
Exercise duration (min): 0 min
LV dias vol: 65 mL (ref 62–150)
LV sys vol: 19 mL
NUC STRESS TID: 0.96
Peak HR: 89 {beats}/min
Percent HR: 68 %
Percent of predicted max HR: 65 %
SRS: 0
SSS: 1
Stage 1 Grade: 0 %
Stage 2 Grade: 0 %
Stage 2 HR: 89 {beats}/min
Stage 3 Grade: 0 %
Stage 3 HR: 89 {beats}/min
Stage 5 DBP: 63 mmHg
Stage 5 HR: 86 {beats}/min

## 2016-07-25 MED ORDER — TECHNETIUM TC 99M TETROFOSMIN IV KIT
31.0110 | PACK | Freq: Once | INTRAVENOUS | Status: AC | PRN
  Administered 2016-07-25: 31.011 via INTRAVENOUS

## 2016-07-25 MED ORDER — REGADENOSON 0.4 MG/5ML IV SOLN
0.4000 mg | Freq: Once | INTRAVENOUS | Status: AC
  Administered 2016-07-25: 0.4 mg via INTRAVENOUS

## 2016-07-25 MED ORDER — TECHNETIUM TC 99M TETROFOSMIN IV KIT
13.0000 | PACK | Freq: Once | INTRAVENOUS | Status: AC | PRN
  Administered 2016-07-25: 12.704 via INTRAVENOUS

## 2016-07-28 ENCOUNTER — Ambulatory Visit (INDEPENDENT_AMBULATORY_CARE_PROVIDER_SITE_OTHER): Payer: Medicare Other

## 2016-07-28 DIAGNOSIS — R079 Chest pain, unspecified: Secondary | ICD-10-CM

## 2016-07-28 DIAGNOSIS — R002 Palpitations: Secondary | ICD-10-CM | POA: Diagnosis not present

## 2016-08-14 DIAGNOSIS — R079 Chest pain, unspecified: Secondary | ICD-10-CM | POA: Diagnosis not present

## 2016-08-21 DIAGNOSIS — R609 Edema, unspecified: Secondary | ICD-10-CM | POA: Diagnosis not present

## 2016-08-21 DIAGNOSIS — M79676 Pain in unspecified toe(s): Secondary | ICD-10-CM | POA: Diagnosis not present

## 2016-08-21 DIAGNOSIS — B351 Tinea unguium: Secondary | ICD-10-CM | POA: Diagnosis not present

## 2016-08-21 DIAGNOSIS — G629 Polyneuropathy, unspecified: Secondary | ICD-10-CM | POA: Diagnosis not present

## 2016-08-25 DIAGNOSIS — M542 Cervicalgia: Secondary | ICD-10-CM | POA: Diagnosis not present

## 2016-08-25 DIAGNOSIS — S0990XS Unspecified injury of head, sequela: Secondary | ICD-10-CM | POA: Diagnosis not present

## 2016-08-25 DIAGNOSIS — M436 Torticollis: Secondary | ICD-10-CM | POA: Diagnosis not present

## 2016-08-25 DIAGNOSIS — G20 Parkinson's disease: Secondary | ICD-10-CM | POA: Diagnosis not present

## 2016-09-08 DIAGNOSIS — M436 Torticollis: Secondary | ICD-10-CM | POA: Diagnosis not present

## 2016-09-08 DIAGNOSIS — M542 Cervicalgia: Secondary | ICD-10-CM | POA: Diagnosis not present

## 2016-09-08 DIAGNOSIS — G20 Parkinson's disease: Secondary | ICD-10-CM | POA: Diagnosis not present

## 2016-09-08 DIAGNOSIS — K5900 Constipation, unspecified: Secondary | ICD-10-CM | POA: Diagnosis not present

## 2016-09-10 ENCOUNTER — Encounter: Payer: Self-pay | Admitting: Neurology

## 2016-09-10 ENCOUNTER — Ambulatory Visit (INDEPENDENT_AMBULATORY_CARE_PROVIDER_SITE_OTHER): Payer: Medicare Other | Admitting: Neurology

## 2016-09-10 VITALS — BP 201/99 | HR 83 | Ht 66.0 in | Wt 158.0 lb

## 2016-09-10 DIAGNOSIS — W19XXXS Unspecified fall, sequela: Secondary | ICD-10-CM

## 2016-09-10 DIAGNOSIS — G243 Spasmodic torticollis: Secondary | ICD-10-CM

## 2016-09-10 DIAGNOSIS — W19XXXA Unspecified fall, initial encounter: Secondary | ICD-10-CM | POA: Insufficient documentation

## 2016-09-10 DIAGNOSIS — G218 Other secondary parkinsonism: Secondary | ICD-10-CM | POA: Diagnosis not present

## 2016-09-10 NOTE — Progress Notes (Signed)
[facility]  PATIENT: Ivan Clark DOB: [birth year]  HISTORY OF PRESENT ILLNESS:Ivan Clark is 83 years old male, accompanied by his wife, initially seen in consultation by his primary care physician [name] for cervical dystonia, mild parkinsonian features. He has past medical history of bladder cancer, hypertension, prostate hypertrophy, over the past 20 years, he also developed gradual onset of anterocollois. Since 2012, wife noticed that he has slowed down significantly, had difficulty initiate gait, small shuffling gait, occasionally bilateral hands tremor, he also developed frequent lightheadedness, happen when he gets up quickly from sitting position, no passing out, consistent with a orthostatic blood pressure changes. He also has chronic constipation, acting out of dreams, loss of sense of smell for a long time period. He contributed to his loss of smell to his long time smoking history, he quit smoking 11 years ago, he used to smoke 2 packs a day. MRI of brain in August 2011: mild atrophy,small vessel disease, no acute lesions. MRA of brain was normal On examination he has mild parkinsonian features, Sinemet 25/100 3 times a day since 2012, he tolerated the medication well, there is mild improvement in his gait, he underwent emergency appendectomy in June 2013, recovered well from his surgery. He denies memory trouble. Selegiline since 04/2012, did very well, but had few episodes of dizziness,   Fainting spell in March 5th 2014, he got up early morning, around 5:45 AM, he was in bathroom, he felt funny after taking few steps, landed on the floor, he was taken to [name], was found to have orthostatic hypotension, was sent to the emergency room, reported normal CT scan. He continues to complain of lightheadedness when standing up quickly,  He began to receive EMG guided butulinum toxin injection for his anterocollis and sialorrhea since March 19th  2014, the first one was in March 2014, the second injection was in July 2nd 2014, each time, he has mild improvement in straighting up his neck better, so he can walk better.  The third injection was October 10th 2014 , the same amount of xeomin 100 units total, 50 units to sternocleidal mastoid muscles, 5 days after the injection, he began to notice difficulty swallowing, especially with liquid, and hard solid food, eventually was admitted to hospital in February 28, 2013 swallowing study has demonstrated moderate pharyngeal and cervical esophageal dysphagia. He require NG tube for one day, dysphagia gradually resolved  He fell in April 09, 2013, with right hip fracture, he had a right hemiarthroplasty, recovered very well, is ambulating without assistance again, He is taking Sinemet IR 25/100 3 times a day, Sinemet CR 50/200 one tablet every night. He has no significant memory trouble, mild REM sleep disorder   UPDATE Apr 21 2014: He is doing good, exercise regularly with a personal trainier twice a week, stretching his legs, he continue have gradual worsening neck flexion forward, denies significant swallowing difficulty, has occasionally choking episode on certain texture of food, such as grapes skin, he still eats regular food, drinking water without difficulty Over the past few months, he noticed early morning calf muscle cramping, worsening low back pain, bilateral feet numbness, no bowel and bladder incontinence. UPDATE June 8th 2016: I have reviewed consult note from movement specialist [name] in March 2016, who concurred the diagnosis of mild Parkinson's disease, cervical dystonia, agree on current management of Sinemet immediate release 25/100 mg 3 times a day, Sinemet CR 50/200 mg once every night He has mild trouble  swallowing, with big chunk solid food, he is still able to eat regular food, acting out of dreams, worsening anterocollis, still able to ambulate without assistance   His wife is using 24-hour oxygen now because of bronchiectasis   UPDATE 12/08/2016CM [name], 83 year old male returns for follow-up. He has a history of mild Parkinson's disease and cervical dystonia and mild dysphagia. He can chock on certain foods such as grapes and he tries to avoid those. He has no problems with liquids. He denies any recent falls, he uses a cane for ambulation. He does some stretching exercises. He does not perform any neck exercises. He reports that his memory is good, he is currently on Sinemet extended release at night and regular release 3 times a day before meals, he is also on Azilect. He returns for reevaluation. UPDATE 6/7/17CM  [name] 83 year old male returns for follow-up. He has a history of Parkinson's disease and cervical dystonia and mild dysphagia. Since last seen 6 months ago he has several falls with MRI from orthopedist in [place]  (epidural steroid 09-06-15 spinal nerve root block 2 wks ago5-23-17))  he has moved from his private home to living at [facility] in [place], [place].  Has [facility] orders to be signed for a hospital bed in a wheelchair. He reports that his appetite is better and he is chocking less. He has been set up for some physical therapy but that has not started. He reports no problems with his memory. He remains on Sinemet and Azilect. Minimal tremor seen today. 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  He is going physical therapy.  His legs feel like magnet, now walking with a walker.   He is taking Sinemet 25/100 3 times a day, carbidopa levodopa ER 50/200 one tablet at bedtime, Azilect 1 mg daily, he was noted to have tongue protrusion, mild dyskinesia  He sleeps on his back, sleeps well.  UPDATE Jul 09 2016: He fell 3 times since 2016-05-16, most recent was on July 07 2016, he finished using bathroom, standing up washing his hand,  without clear triggers, he fell backwards, landed on his back, now with right side chest pain, and sternal pain upon deep breathing,  He is taking Sinemet 25/100 mg one and half tablets 3 times a day, Azilect 1 mg daily, he tolerates the medication well,  Today he was found to have irregular heart rhythm,  UPDATE Sep 10 2016: I was able to review his cardiology visit with Dr. Wende Bushy on July 16 2016, poor progression of R-wave on EKG, he does have risk factor of coronary artery disease including advanced age, hypertension, hyperlipidemia, proceed with stress testing, optimize blood pressure control,  He fell on August 15 2016, he fell backward, no LOC, today's blood pressure was noted to be elevated, he has missed his morning dose of medications.   REVIEW OF SYSTEMS: Full 14 system review of systems performed and notable only for those listed, all others are neg:  As above  ALLERGIES: No Known Allergies  HOME MEDICATIONS: Outpatient Medications Prior to Visit  Medication Sig Dispense Refill  . acetaminophen (TYLENOL) 500 MG tablet Take 500 mg by mouth as needed.    [name] amLODipine (NORVASC) 5 MG tablet Take 5 mg by mouth daily.  4  . aspirin EC 81 MG tablet Take 1 tablet (81 mg total) by mouth daily after  supper.    [name] atorvastatin (LIPITOR) 20 MG tablet Take 1 tablet (20 mg total) by mouth daily. 30 tablet 1  . carbidopa-levodopa (SINEMET IR) 25-100 MG tablet 1.5 tablets at 8, 12, 6:30pm 420 tablet 4  . cholecalciferol (VITAMIN D) 1000 units tablet Take 2,000 Units by mouth daily.     [name] docusate sodium (COLACE) 100 MG capsule Take 100 mg by mouth 2 (two) times daily.    . finasteride (PROSCAR) 5 MG tablet   0  . fluticasone (FLONASE) 50 MCG/ACT nasal spray 1 spray daily as needed.  0  . irbesartan (AVAPRO) 150 MG tablet Take 1 tablet (150 mg total) by mouth 2 (two) times daily. (Patient taking differently: Take 150 mg by mouth daily. ) 60 tablet 1  . Polyethyl Glycol-Propyl Glycol  (SYSTANE) 0.4-0.3 % SOLN Apply to eye as needed.    . rasagiline (AZILECT) 1 MG TABS tablet Take 1 tablet (1 mg total) by mouth daily. 90 tablet 4  . tamsulosin (FLOMAX) 0.4 MG CAPS capsule Take 1 capsule (0.4 mg total) by mouth daily after supper. 30 capsule 1  . naproxen sodium (ANAPROX) 220 MG tablet Take 220 mg by mouth as needed.     Facility-Administered Medications Prior to Visit  Medication Dose Route Frequency Provider Last Rate Last Dose  . mitomycin (MUTAMYCIN) chemo injection 40 mg  40 mg Bladder Instillation Once Festus Aloe, MD        PAST MEDICAL HISTORY: Past Medical History:  Diagnosis Date  . Aneurysm of infrarenal abdominal aorta (HCC) SUPRAILIAC  4CM PER CT 10-20-2010--  STABLE  . Bladder cancer ([place]) RECURRENT  (FIRST DX  2001)   HX TURBT AND CHEMO BLADDER INSTILLATION TX'S  . Frequency of urination   . Hyperlipidemia   . Hypertension   . Nocturia   . Orthostatic hypotension   . Parkinson disease ([place])   . Peripheral neuropathy     PAST SURGICAL HISTORY: Past Surgical History:  Procedure Laterality Date  . APPENDECTOMY  10-20-2010  . CYSTO/ BLADDER BX/ FULGERATION BLADDER TUMOR  10-09-2004  . CYSTO/ RETROGRADE PYELOGRAM/ BLADDER BX'S  07-03-2004  . CYSTOSCOPY WITH BIOPSY  11/28/2011   Procedure: CYSTOSCOPY WITH BIOPSY;  Surgeon: Fredricka Bonine, MD;  Location: [facility];  Service: Urology;  Laterality: N/A;  . HIP ARTHROPLASTY Right 04/09/2013   Procedure: ARTHROPLASTY BIPOLAR HIP;  Surgeon: Augustin Schooling, MD;  Location: [facility];  Service: Orthopedics;  Laterality: Right;  . LEFT INGUINAL HERNIA REPAIR  1996  . TONSILLECTOMY AND ADENOIDECTOMY  1938  . TRANSURETHRAL RESECTION OF BLADDER TUMOR  09-11-1999   BLADDER CANCER  . VASECTOMY      FAMILY HISTORY: Family History  Problem Relation Age of Onset  . Alzheimer's disease Mother   . Heart failure Father   . Kidney cancer Neg Hx   . Prostate cancer Neg Hx     SOCIAL  HISTORY: Social History   Social History  . Marital status: Widowed    Spouse name: Katharine Look  . Number of children: 4  . Years of education: college   Occupational History  .      retired   Social History Main Topics  . Smoking status: Former Smoker    Packs/day: 2.00    Years: 50.00    Types: Cigarettes    Quit date: 05/13/1999  . Smokeless tobacco: Never Used  . Alcohol use No  . Drug use: No  . Sexual activity: Not on file  Other Topics Concern  . Not on file   Social History Narrative   Patient lives at [facility] in [place], [place]   Education- college education.   Retired.   Right handed.   Caffeine- Very Little     PHYSICAL EXAM  Vitals:   09/10/16 0847  Weight: 158 lb (71.7 kg)  Height: 5\' 6"  (1.676 m)   Body mass index is 25.5 kg/m.   PHYSICAL EXAMNIATION:  Gen: NAD, conversant, well nourised, obese, well groomed                     Cardiovascular: Regular rate rhythm, no peripheral edema, warm, nontender. Eyes: Conjunctivae clear without exudates or hemorrhage Neck: Supple, no carotid bruits. Pulmonary: Clear to auscultation bilaterally   NEUROLOGICAL EXAM:  MENTAL STATUS: Speech:    Mildly slurred speech, fluent and spontaneous with normal comprehension.  Cognition:     Orientation to time, place and person     Normal recent and remote memory     Normal Attention span and concentration     Normal Language, naming, repeating,spontaneous speech     Fund of knowledge   CRANIAL NERVES: CN II: Visual fields are full to confrontation.  Pupils are round equal and briskly reactive to light. CN III, IV, VI: extraocular movement are normal. No ptosis. CN V: Facial sensation is intact to pinprick in all 3 divisions bilaterally. Corneal responses are intact.  CN VII: Face is symmetric with normal eye closure and smile. CN VIII: Hearing is normal to rubbing fingers CN IX, X: Palate elevates symmetrically. Phonation is normal. CN XI: Head turning and  shoulder shrug are intact CN XII: Tongue is midline with normal movements and no atrophy.  MOTOR: There was no weakness, mild bilateral upper extremity rigidity, right worse than left, severe anterocollis, maximum neck was not able to pass erect position, also with moderate left tilt  REFLEXES: Reflexes are 1 and symmetric at the biceps, triceps, knees, and ankles. Plantar responses are flexor.  SENSORY: Intact to light touch, pinprick  COORDINATION: Rapid alternating movements and fine finger movements are intact. There is no dysmetria on finger-to-nose and heel-knee-shin.    GAIT/STANCE: He needs assistance to get up from seated position, tendency to lean backwards, rely on his walker, small shuffling mildly unsteady gait, severe anterocollis, retropulsed instability  DIAGNOSTIC DATA (LABS, IMAGING, TESTING) -  ASSESSMENT AND PLAN 83 y.o. year old male Idiopathic Parkinson's disease  Keep current dose of Sinemet 25/100 mg 1 half tablet 3 times a day   Azilect 1mg  daily  Continue moderate exercise, Cervical dystonia, severe anterocollis, moderate left tilt Gait abnormality, frequent falling   Marcial Pacas, M.D. Ph.D.  [facility] [place], [place] [address] Phone: [phone] Fax:      [phone]

## 2016-09-11 DIAGNOSIS — M4316 Spondylolisthesis, lumbar region: Secondary | ICD-10-CM | POA: Diagnosis not present

## 2016-10-09 DIAGNOSIS — Z79899 Other long term (current) drug therapy: Secondary | ICD-10-CM | POA: Diagnosis not present

## 2016-10-10 ENCOUNTER — Emergency Department
Admission: EM | Admit: 2016-10-10 | Discharge: 2016-10-10 | Disposition: A | Discharge status: Home / Self Care | Payer: Medicare Other | Attending: Emergency Medicine | Admitting: Emergency Medicine

## 2016-10-10 ENCOUNTER — Emergency Department: Payer: Medicare Other

## 2016-10-10 ENCOUNTER — Encounter: Payer: Self-pay | Admitting: Vascular Surgery

## 2016-10-10 DIAGNOSIS — R52 Pain, unspecified: Secondary | ICD-10-CM

## 2016-10-10 DIAGNOSIS — Z471 Aftercare following joint replacement surgery: Secondary | ICD-10-CM | POA: Diagnosis not present

## 2016-10-10 DIAGNOSIS — Z79899 Other long term (current) drug therapy: Secondary | ICD-10-CM | POA: Diagnosis not present

## 2016-10-10 DIAGNOSIS — G8929 Other chronic pain: Secondary | ICD-10-CM | POA: Insufficient documentation

## 2016-10-10 DIAGNOSIS — M25552 Pain in left hip: Secondary | ICD-10-CM | POA: Diagnosis not present

## 2016-10-10 DIAGNOSIS — Z8551 Personal history of malignant neoplasm of bladder: Secondary | ICD-10-CM | POA: Insufficient documentation

## 2016-10-10 DIAGNOSIS — S0990XA Unspecified injury of head, initial encounter: Secondary | ICD-10-CM

## 2016-10-10 DIAGNOSIS — R51 Headache: Secondary | ICD-10-CM | POA: Diagnosis not present

## 2016-10-10 DIAGNOSIS — Y939 Activity, unspecified: Secondary | ICD-10-CM | POA: Diagnosis not present

## 2016-10-10 DIAGNOSIS — T148XXA Other injury of unspecified body region, initial encounter: Secondary | ICD-10-CM

## 2016-10-10 DIAGNOSIS — Z87891 Personal history of nicotine dependence: Secondary | ICD-10-CM | POA: Diagnosis not present

## 2016-10-10 DIAGNOSIS — W0110XA Fall on same level from slipping, tripping and stumbling with subsequent striking against unspecified object, initial encounter: Secondary | ICD-10-CM | POA: Diagnosis not present

## 2016-10-10 DIAGNOSIS — Y929 Unspecified place or not applicable: Secondary | ICD-10-CM | POA: Diagnosis not present

## 2016-10-10 DIAGNOSIS — S80211A Abrasion, right knee, initial encounter: Secondary | ICD-10-CM | POA: Insufficient documentation

## 2016-10-10 DIAGNOSIS — Y999 Unspecified external cause status: Secondary | ICD-10-CM | POA: Insufficient documentation

## 2016-10-10 DIAGNOSIS — S80212A Abrasion, left knee, initial encounter: Secondary | ICD-10-CM | POA: Diagnosis not present

## 2016-10-10 DIAGNOSIS — W19XXXA Unspecified fall, initial encounter: Secondary | ICD-10-CM | POA: Diagnosis not present

## 2016-10-10 DIAGNOSIS — Z96641 Presence of right artificial hip joint: Secondary | ICD-10-CM | POA: Diagnosis not present

## 2016-10-10 DIAGNOSIS — I10 Essential (primary) hypertension: Secondary | ICD-10-CM | POA: Diagnosis not present

## 2016-10-10 DIAGNOSIS — S0081XA Abrasion of other part of head, initial encounter: Secondary | ICD-10-CM | POA: Diagnosis not present

## 2016-10-10 DIAGNOSIS — Z7982 Long term (current) use of aspirin: Secondary | ICD-10-CM | POA: Insufficient documentation

## 2016-10-10 DIAGNOSIS — M25561 Pain in right knee: Secondary | ICD-10-CM | POA: Diagnosis not present

## 2016-10-10 DIAGNOSIS — M25551 Pain in right hip: Secondary | ICD-10-CM | POA: Diagnosis not present

## 2016-10-10 NOTE — ED Triage Notes (Signed)
Pt reports to the ED via [facility] from [facility] after he sustained a mechanical fall. Fall was unwitnessed. Pt states he got tripped up over his walker. He did hit his head and landed on his left hip. He denies any LOC. Skin tear noted to head and left knee. No obvious crepitus or deformity noted to left leg. Leg is internally rotated. CMS intact. Pt is A&Ox4, resp e/u, and skin warm and dry.

## 2016-10-10 NOTE — ED Notes (Signed)
Pt and Pt's daughter verbalized understanding of discharge instructions. NAD at this time. 

## 2016-10-10 NOTE — ED Provider Notes (Addendum)
[facility] Emergency Department Provider Note  ____________________________________________   I have reviewed the triage vital signs and the nursing notes.   HISTORY  Chief Complaint Fall    HPI Ivan Clark is a 83 y.o. male suffers from Parkinson's, he said his baseline according to family in all respects. He was walking today had a nonsurgical fall. He tripped on his walker. Bumped his head. Did not pass out. He bumped his right knee as well, does not feel that it is injured significantly. Has chronic pain in his right hip which does not seem to be different from baseline. He states that he has minimal discomfort where he bumped his head but no significant headache, no neurologic deficit or complaints, he was feeling in his normal state of health prior to the fall and at this time as well.   Past Medical History:  Diagnosis Date  . Aneurysm of infrarenal abdominal aorta (HCC) SUPRAILIAC  4CM PER CT 10-20-2010--  STABLE  . Bladder cancer ([place]) RECURRENT  (FIRST DX  2001)   HX TURBT AND CHEMO BLADDER INSTILLATION TX'S  . Frequency of urination   . Hyperlipidemia   . Hypertension   . Nocturia   . Orthostatic hypotension   . Parkinson disease ([place])   . Peripheral neuropathy     Patient Active Problem List   Diagnosis Date Noted  . Fall 09/10/2016  . Frequent falls 10/17/2015  . Fracture of femoral neck, right ([place]) 04/14/2013  . Right femoral fracture ([place]) 04/09/2013  . Hypertension 04/09/2013  . Hyperlipidemia 04/09/2013  . BPH (benign prostatic hypertrophy) 04/09/2013  . Closed right hip fracture ([place]) 04/09/2013  . Hip fracture, right ([place]) 04/09/2013  . Femoral neck fracture ([place]) 04/09/2013  . Dysphagia, pharyngeal 03/02/2013  . Cervical dystonia 07/28/2012  . Sialorrhea 07/28/2012  . Personal history of malignant neoplasm of bladder 07/27/2012  . Essential hypertension 07/27/2012  . Dizziness and giddiness 07/27/2012  .  Parkinsonism ([place]) 07/27/2012    Past Surgical History:  Procedure Laterality Date  . APPENDECTOMY  10-20-2010  . CYSTO/ BLADDER BX/ FULGERATION BLADDER TUMOR  10-09-2004  . CYSTO/ RETROGRADE PYELOGRAM/ BLADDER BX'S  07-03-2004  . CYSTOSCOPY WITH BIOPSY  11/28/2011   Procedure: CYSTOSCOPY WITH BIOPSY;  Surgeon: Fredricka Bonine, MD;  Location: [facility];  Service: Urology;  Laterality: N/A;  . HIP ARTHROPLASTY Right 04/09/2013   Procedure: ARTHROPLASTY BIPOLAR HIP;  Surgeon: Augustin Schooling, MD;  Location: [facility];  Service: Orthopedics;  Laterality: Right;  . LEFT INGUINAL HERNIA REPAIR  1996  . TONSILLECTOMY AND ADENOIDECTOMY  1938  . TRANSURETHRAL RESECTION OF BLADDER TUMOR  09-11-1999   BLADDER CANCER  . VASECTOMY      Prior to Admission medications   Medication Sig Start Date End Date Taking? Authorizing Provider  acetaminophen (TYLENOL) 500 MG tablet Take 500 mg by mouth as needed.    [provider]  amLODipine (NORVASC) 5 MG tablet Take 5 mg by mouth daily. 04/23/16   [provider]  aspirin EC 81 MG tablet Take 1 tablet (81 mg total) by mouth daily after supper. 04/22/13   Angiulli, Lavon Paganini, PA-C  atorvastatin (LIPITOR) 20 MG tablet Take 1 tablet (20 mg total) by mouth daily. 04/22/13   Angiulli, Lavon Paganini, PA-C  carbidopa-levodopa (SINEMET IR) 25-100 MG tablet 1.5 tablets at 8, 12, 6:30pm 04/17/16   Marcial Pacas, MD  cholecalciferol (VITAMIN D) 1000 units tablet Take 2,000 Units by mouth daily.  [provider]  docusate sodium (COLACE) 100 MG capsule Take 100 mg by mouth 2 (two) times daily.    [provider]  finasteride (PROSCAR) 5 MG tablet  05/22/14   [provider]  fluticasone (FLONASE) 50 MCG/ACT nasal spray 1 spray daily as needed. 06/02/16   [provider]  irbesartan (AVAPRO) 150 MG tablet Take 1 tablet (150 mg total) by mouth 2 (two) times daily. Patient taking differently: Take 150 mg by  mouth daily.  04/22/13   Angiulli, Lavon Paganini, PA-C  naproxen sodium (ANAPROX) 220 MG tablet Take 220 mg by mouth as needed.    [provider]  Polyethyl Glycol-Propyl Glycol (SYSTANE) 0.4-0.3 % SOLN Apply to eye as needed.    [provider]  rasagiline (AZILECT) 1 MG TABS tablet Take 1 tablet (1 mg total) by mouth daily. 07/09/16   Marcial Pacas, MD  tamsulosin (FLOMAX) 0.4 MG CAPS capsule Take 1 capsule (0.4 mg total) by mouth daily after supper. 04/22/13   Angiulli, Lavon Paganini, PA-C    Allergies Patient has no known allergies.  Family History  Problem Relation Age of Onset  . Alzheimer's disease Mother   . Heart failure Father   . Kidney cancer Neg Hx   . Prostate cancer Neg Hx     Social History Social History  Substance Use Topics  . Smoking status: Former Smoker    Packs/day: 2.00    Years: 50.00    Types: Cigarettes    Quit date: 05/13/1999  . Smokeless tobacco: Never Used  . Alcohol use No    Review of Systems Constitutional: No fever/chills Eyes: No visual changes. ENT: No sore throat. No stiff neck no neck pain Cardiovascular: Denies chest pain. Respiratory: Denies shortness of breath. Gastrointestinal:   no vomiting.  No diarrhea.  No constipation. Genitourinary: Negative for dysuria. Musculoskeletal: Negative lower extremity swelling Skin: Negative for rash. Neurological: Negative for severe headaches, focal weakness or numbness.   ____________________________________________   PHYSICAL EXAM:  VITAL SIGNS: ED Triage Vitals [10/10/16 1150]  Enc Vitals Group     BP (!) 153/72     Pulse Rate 78     Resp (!) 23     Temp 97.8 F (36.6 C)     Temp src      SpO2 95 %     Weight      Height      Head Circumference      Peak Flow      Pain Score      Pain Loc      Pain Edu?      Excl. in [place]?     Constitutional: Alert and oriented. Well appearing and in no acute distress. Eyes: Conjunctivae are normal Head: Abrasion to the top of the  head no other injury noted no skull fracture HEENT: No congestion/rhinnorhea. Mucous membranes are moist.  Oropharynx non-erythematous Neck:   Nontender with no meningismus, no masses, no stridor Cardiovascular: Normal rate, regular rhythm. Grossly normal heart sounds.  Good peripheral circulation. Respiratory: Normal respiratory effort.  No retractions. Lungs CTAB. Abdominal: Soft and nontender. No distention. No guarding no rebound Back:  There is no focal tenderness or step off.  there is no midline tenderness there are no lesions noted. there is no CVA tenderness  Musculoskeletal: No lower extremity tenderness, no upper extremity tenderness. No joint effusions, no DVT signs strong distal pulses no edema Neurologic:  Normal speech and language. No gross focal neurologic deficits are  appreciated. Parkinson tremor Skin:  Skin is warm, dry and intact for facial abrasions noted to both knees. Psychiatric: Mood and affect are normal. Speech and behavior are normal.  ____________________________________________   LABS (all labs ordered are listed, but only abnormal results are displayed)  Labs Reviewed - No data to display ____________________________________________  EKG  I personally interpreted any EKGs ordered by me or triage ____________________________________________  RADIOLOGY  I reviewed any imaging ordered by me or triage that were performed during my shift and, if possible, patient and/or family made aware of any abnormal findings. ____________________________________________   PROCEDURES  Procedure(s) performed: None  Procedures  Critical Care performed: None  ____________________________________________   INITIAL IMPRESSION / ASSESSMENT AND PLAN / ED COURSE  Pertinent labs & imaging results that were available during my care of the patient were reviewed by me and considered in my medical decision making (see chart for details).  Patient here with some  abrasions after a non-syncopal fall, because he takes aspirin we scanned his head which is negative, no evidence of fracture, he is feeling quite well. He declines pain medication. Non-syncopal event. Mechanical fall. Well remembered. He will assess his tetanus status, family feels that no further workup is needed and I agree. They just wanted to make sure he wasn't injured after his fall which I don't see any significant evidence of, return precautions and follow-up given and understood   ----------------------------------------- 1:40 PM on 10/10/2016 -----------------------------------------  tdap utd ____________________________________________   FINAL CLINICAL IMPRESSION(S) / ED DIAGNOSES  Final diagnoses:  Pain      This chart was dictated using voice recognition software.  Despite best efforts to proofread,  errors can occur which can change meaning.      Schuyler Amor, MD 10/10/16 1339    Schuyler Amor, MD 10/10/16 1341

## 2016-10-10 NOTE — ED Notes (Signed)
Patient transported to CT 

## 2016-10-10 NOTE — ED Notes (Signed)
Patient not in room at this time gone for testing

## 2016-10-10 NOTE — ED Notes (Signed)
Patient walked very well with walker

## 2016-10-13 DIAGNOSIS — I10 Essential (primary) hypertension: Secondary | ICD-10-CM | POA: Diagnosis not present

## 2016-10-13 DIAGNOSIS — N189 Chronic kidney disease, unspecified: Secondary | ICD-10-CM | POA: Diagnosis not present

## 2016-10-13 DIAGNOSIS — K5900 Constipation, unspecified: Secondary | ICD-10-CM | POA: Diagnosis not present

## 2016-10-13 DIAGNOSIS — G20 Parkinson's disease: Secondary | ICD-10-CM | POA: Diagnosis not present

## 2016-10-16 DIAGNOSIS — Z9181 History of falling: Secondary | ICD-10-CM | POA: Diagnosis not present

## 2016-10-16 DIAGNOSIS — I10 Essential (primary) hypertension: Secondary | ICD-10-CM | POA: Diagnosis not present

## 2016-10-16 DIAGNOSIS — E860 Dehydration: Secondary | ICD-10-CM | POA: Diagnosis not present

## 2016-10-16 DIAGNOSIS — Z7982 Long term (current) use of aspirin: Secondary | ICD-10-CM | POA: Diagnosis not present

## 2016-10-16 DIAGNOSIS — Z79899 Other long term (current) drug therapy: Secondary | ICD-10-CM | POA: Diagnosis not present

## 2016-10-16 DIAGNOSIS — G20 Parkinson's disease: Secondary | ICD-10-CM | POA: Diagnosis not present

## 2016-10-20 DIAGNOSIS — M542 Cervicalgia: Secondary | ICD-10-CM | POA: Diagnosis not present

## 2016-10-20 DIAGNOSIS — I10 Essential (primary) hypertension: Secondary | ICD-10-CM | POA: Diagnosis not present

## 2016-10-20 DIAGNOSIS — K5900 Constipation, unspecified: Secondary | ICD-10-CM | POA: Diagnosis not present

## 2016-10-20 DIAGNOSIS — N189 Chronic kidney disease, unspecified: Secondary | ICD-10-CM | POA: Diagnosis not present

## 2016-10-23 DIAGNOSIS — H269 Unspecified cataract: Secondary | ICD-10-CM | POA: Diagnosis not present

## 2016-10-23 DIAGNOSIS — H35111 Retinopathy of prematurity, stage 0, right eye: Secondary | ICD-10-CM | POA: Diagnosis not present

## 2016-11-14 ENCOUNTER — Emergency Department
Admission: EM | Admit: 2016-11-14 | Discharge: 2016-11-15 | Disposition: A | Discharge status: Home / Self Care | Payer: Medicare Other | Attending: Emergency Medicine | Admitting: Emergency Medicine

## 2016-11-14 ENCOUNTER — Emergency Department: Payer: Medicare Other

## 2016-11-14 ENCOUNTER — Encounter: Payer: Self-pay | Admitting: *Deleted

## 2016-11-14 DIAGNOSIS — Y999 Unspecified external cause status: Secondary | ICD-10-CM | POA: Diagnosis not present

## 2016-11-14 DIAGNOSIS — Y92121 Bathroom in nursing home as the place of occurrence of the external cause: Secondary | ICD-10-CM | POA: Insufficient documentation

## 2016-11-14 DIAGNOSIS — S42032A Displaced fracture of lateral end of left clavicle, initial encounter for closed fracture: Secondary | ICD-10-CM | POA: Diagnosis not present

## 2016-11-14 DIAGNOSIS — M25512 Pain in left shoulder: Secondary | ICD-10-CM | POA: Diagnosis not present

## 2016-11-14 DIAGNOSIS — M542 Cervicalgia: Secondary | ICD-10-CM | POA: Diagnosis not present

## 2016-11-14 DIAGNOSIS — M79602 Pain in left arm: Secondary | ICD-10-CM | POA: Diagnosis not present

## 2016-11-14 DIAGNOSIS — W19XXXA Unspecified fall, initial encounter: Secondary | ICD-10-CM | POA: Diagnosis not present

## 2016-11-14 DIAGNOSIS — Z79899 Other long term (current) drug therapy: Secondary | ICD-10-CM | POA: Insufficient documentation

## 2016-11-14 DIAGNOSIS — I10 Essential (primary) hypertension: Secondary | ICD-10-CM | POA: Diagnosis not present

## 2016-11-14 DIAGNOSIS — W01198A Fall on same level from slipping, tripping and stumbling with subsequent striking against other object, initial encounter: Secondary | ICD-10-CM | POA: Diagnosis not present

## 2016-11-14 DIAGNOSIS — Y9301 Activity, walking, marching and hiking: Secondary | ICD-10-CM | POA: Insufficient documentation

## 2016-11-14 DIAGNOSIS — S199XXA Unspecified injury of neck, initial encounter: Secondary | ICD-10-CM | POA: Diagnosis not present

## 2016-11-14 DIAGNOSIS — S4992XA Unspecified injury of left shoulder and upper arm, initial encounter: Secondary | ICD-10-CM | POA: Diagnosis present

## 2016-11-14 DIAGNOSIS — Z87891 Personal history of nicotine dependence: Secondary | ICD-10-CM | POA: Insufficient documentation

## 2016-11-14 DIAGNOSIS — S42002A Fracture of unspecified part of left clavicle, initial encounter for closed fracture: Secondary | ICD-10-CM | POA: Diagnosis not present

## 2016-11-14 DIAGNOSIS — Z7982 Long term (current) use of aspirin: Secondary | ICD-10-CM | POA: Diagnosis not present

## 2016-11-14 DIAGNOSIS — G20 Parkinson's disease: Secondary | ICD-10-CM | POA: Diagnosis not present

## 2016-11-14 NOTE — ED Triage Notes (Signed)
Per EMS report, patient was turning in the bathroom to put a bottle away and fell on left side. Patient c/o left shoulder pain and has a skin tear on left knee. Patient denies any LOC before or after fall. Patient is from [place]. B/P obtained by EMS was 224/111 which patient states he was hypertensive to this degree on previous visit to the ED.

## 2016-11-14 NOTE — ED Notes (Signed)
Assisted pt with urinal

## 2016-11-14 NOTE — ED Provider Notes (Signed)
[facility] Emergency Department Provider Note   ____________________________________________   First MD Initiated Contact with Patient 11/14/16 2306     (approximate)  I have reviewed the triage vital signs and the nursing notes.   HISTORY  Chief Complaint Fall    HPI ANDRIEL OMALLEY is a 83 y.o. male brought to the ED from assisted living facility via EMS with a chief complaint of mechanical fall. Patient has a history of Parkinson's dementia, turned to put a bottle away in the bathroom, lost his balance and fell onto his left side. Denies striking head or LOC. Complains of left neck and shoulder pain. Has a skin tear on his left knee. He was able to get up with assistance and ambulate after the fall. Hypertensive on arrival which he states is his baseline. Denies headache, vision changes, chest pain, shortness of breath, abdominal pain, nausea, vomiting, dysuria. He was administered Tylenol prior to arrival and states pain has improved.Takes a baby aspirin daily; denies other anticoagulants.   Past Medical History:  Diagnosis Date  . Aneurysm of infrarenal abdominal aorta (HCC) SUPRAILIAC  4CM PER CT 10-20-2010--  STABLE  . Bladder cancer ([place]) RECURRENT  (FIRST DX  2001)   HX TURBT AND CHEMO BLADDER INSTILLATION TX'S  . Frequency of urination   . Hyperlipidemia   . Hypertension   . Nocturia   . Orthostatic hypotension   . Parkinson disease ([place])   . Peripheral neuropathy     Patient Active Problem List   Diagnosis Date Noted  . Fall 09/10/2016  . Frequent falls 10/17/2015  . Fracture of femoral neck, right ([place]) 04/14/2013  . Right femoral fracture ([place]) 04/09/2013  . Hypertension 04/09/2013  . Hyperlipidemia 04/09/2013  . BPH (benign prostatic hypertrophy) 04/09/2013  . Closed right hip fracture ([place]) 04/09/2013  . Hip fracture, right ([place]) 04/09/2013  . Femoral neck fracture ([place]) 04/09/2013  . Dysphagia, pharyngeal 03/02/2013  .  Cervical dystonia 07/28/2012  . Sialorrhea 07/28/2012  . Personal history of malignant neoplasm of bladder 07/27/2012  . Essential hypertension 07/27/2012  . Dizziness and giddiness 07/27/2012  . Parkinsonism ([place]) 07/27/2012    Past Surgical History:  Procedure Laterality Date  . APPENDECTOMY  10-20-2010  . CYSTO/ BLADDER BX/ FULGERATION BLADDER TUMOR  10-09-2004  . CYSTO/ RETROGRADE PYELOGRAM/ BLADDER BX'S  07-03-2004  . CYSTOSCOPY WITH BIOPSY  11/28/2011   Procedure: CYSTOSCOPY WITH BIOPSY;  Surgeon: Fredricka Bonine, MD;  Location[facility];  Service: Urology;  Laterality: N/A;  . HIP ARTHROPLASTY Right 04/09/2013   Procedure: ARTHROPLASTY BIPOLAR HIP;  Surgeon: Augustin Schooling, MD;  Location: [place];  Service: Orthopedics;  Laterality: Right;  . LEFT INGUINAL HERNIA REPAIR  1996  . TONSILLECTOMY AND ADENOIDECTOMY  1938  . TRANSURETHRAL RESECTION OF BLADDER TUMOR  09-11-1999   BLADDER CANCER  . VASECTOMY      Prior to Admission medications   Medication Sig Start Date End Date Taking? Authorizing Provider  acetaminophen (TYLENOL) 500 MG tablet Take 500 mg by mouth as needed.    [provider]  amLODipine (NORVASC) 5 MG tablet Take 5 mg by mouth daily. 04/23/16   [provider]  aspirin EC 81 MG tablet Take 1 tablet (81 mg total) by mouth daily after supper. 04/22/13   Angiulli, Lavon Paganini, PA-C  atorvastatin (LIPITOR) 20 MG tablet Take 1 tablet (20 mg total) by mouth daily. 04/22/13   Angiulli, Lavon Paganini, PA-C  carbidopa-levodopa (SINEMET IR) 25-100  MG tablet 1.5 tablets at 8, 12, 6:30pm 04/17/16   Marcial Pacas, MD  cholecalciferol (VITAMIN D) 1000 units tablet Take 2,000 Units by mouth daily.     [provider]  docusate sodium (COLACE) 100 MG capsule Take 100 mg by mouth 2 (two) times daily.    [provider]  finasteride (PROSCAR) 5 MG tablet  05/22/14   [provider]  fluticasone (FLONASE) 50 MCG/ACT nasal  spray 1 spray daily as needed. 06/02/16   [provider]  HYDROcodone-acetaminophen (NORCO) 5-325 MG tablet Take 1 tablet by mouth every 6 (six) hours as needed for moderate pain. 11/15/16   Paulette Blanch, MD  irbesartan (AVAPRO) 150 MG tablet Take 1 tablet (150 mg total) by mouth 2 (two) times daily. Patient taking differently: Take 150 mg by mouth daily.  04/22/13   Angiulli, Lavon Paganini, PA-C  naproxen sodium (ANAPROX) 220 MG tablet Take 220 mg by mouth as needed.    [provider]  Polyethyl Glycol-Propyl Glycol (SYSTANE) 0.4-0.3 % SOLN Apply to eye as needed.    [provider]  rasagiline (AZILECT) 1 MG TABS tablet Take 1 tablet (1 mg total) by mouth daily. 07/09/16   Marcial Pacas, MD  tamsulosin (FLOMAX) 0.4 MG CAPS capsule Take 1 capsule (0.4 mg total) by mouth daily after supper. 04/22/13   Angiulli, Lavon Paganini, PA-C    Allergies Patient has no known allergies.  Family History  Problem Relation Age of Onset  . Alzheimer's disease Mother   . Heart failure Father   . Kidney cancer Neg Hx   . Prostate cancer Neg Hx     Social History Social History  Substance Use Topics  . Smoking status: Former Smoker    Packs/day: 2.00    Years: 50.00    Types: Cigarettes    Quit date: 05/13/1999  . Smokeless tobacco: Never Used  . Alcohol use No    Review of Systems  Constitutional: No fever/chills. Eyes: No visual changes. ENT: No sore throat. Cardiovascular: Denies chest pain. Respiratory: Denies shortness of breath. Gastrointestinal: No abdominal pain.  No nausea, no vomiting.  No diarrhea.  No constipation. Genitourinary: Negative for dysuria. Musculoskeletal: Positive for left neck and shoulder pain. Skin: Negative for rash. Neurological: Negative for headaches, focal weakness or numbness.   ____________________________________________   PHYSICAL EXAM:  VITAL SIGNS: ED Triage Vitals  Enc Vitals Group     BP 11/14/16 2242 (!) 228/100     Pulse Rate  11/14/16 2242 87     Resp 11/14/16 2242 18     Temp 11/14/16 2242 97.9 F (36.6 C)     Temp Source 11/14/16 2242 Oral     SpO2 11/14/16 2242 97 %     Weight 11/14/16 2243 158 lb (71.7 kg)     Height 11/14/16 2243 5\' 6"  (1.676 m)     Head Circumference --      Peak Flow --      Pain Score 11/14/16 2240 8     Pain Loc --      Pain Edu? --      Excl. in [place]? --     Constitutional: Alert and oriented. Well appearing and in no acute distress. Eyes: Conjunctivae are normal. PERRL. EOMI. Head: Atraumatic. Nose: No congestion/rhinnorhea. Mouth/Throat: Mucous membranes are moist.  Oropharynx non-erythematous. Neck: No stridor.  Left neck pain. No carotid bruits. No step-offs or deformities. Cardiovascular: Normal rate, regular rhythm. Grossly normal heart sounds.  Good peripheral circulation.  Respiratory: Normal respiratory effort.  No retractions. Lungs CTAB. Gastrointestinal: Soft and nontender. No distention. No abdominal bruits. No CVA tenderness. Musculoskeletal: Stable pelvis. Left anterior shoulder tender to palpation. Limited range of motion secondary to pain. 2+ radial pulses. Brisk, less than 5 second capillary refill.  Neurologic:  Normal speech and language. No gross focal neurologic deficits are appreciated.  Skin:  Skin is warm, dry and intact. No rash noted. Psychiatric: Mood and affect are normal. Speech and behavior are normal.  ____________________________________________   LABS (all labs ordered are listed, but only abnormal results are displayed)  Labs Reviewed - No data to display ____________________________________________  EKG  None ____________________________________________  RADIOLOGY  Ct Cervical Spine Wo Contrast  Result Date: 11/15/2016 CLINICAL DATA:  Cervical neck pain after fall. Mechanical fall in the bathroom. EXAM: CT CERVICAL SPINE WITHOUT CONTRAST TECHNIQUE: Multidetector CT imaging of the cervical spine was performed without intravenous  contrast. Multiplanar CT image reconstructions were also generated. COMPARISON:  None. FINDINGS: Alignment: Mild to moderate anterolisthesis of C4 on C5. Milder anterolisthesis of C3 on C4. There is erosive of normal lordosis. Facets are normally aligned. C1 is well seated on C2. Skull base and vertebrae: Mild superior endplate compression fracture of T2 is age indeterminate, new from 2012. Cervical vertebral body heights are maintained. The dens and skull base are intact. Soft tissues and spinal canal: No prevertebral fluid or swelling. No visible canal hematoma. Disc levels: Disc space narrowing from C3-C4 through C6-C7, multilevel endplate spurring. Bulky anterior osteophytes at C5-C6 and C6-C7. There is advanced facet arthropathy. Upper chest: Left distal clavicle fracture. Upper ribs are intact. Mild emphysema. Atherosclerosis of the thoracic aorta. Other: Carotid calcifications. IMPRESSION: 1. No acute fracture or subluxation of the cervical spine. 2. Mild superior endplate compression fracture of T2 in the upper thoracic spine, age indeterminate but new from 2012. 3. Moderate degenerative disc disease with advanced facet arthropathy. Electronically Signed   By: Jeb Levering M.D.   On: 11/15/2016 00:46   Dg Shoulder Left  Result Date: 11/15/2016 CLINICAL DATA:  Mechanical fall in the bathroom. Left shoulder pain. EXAM: LEFT SHOULDER - 2+ VIEW COMPARISON:  None. FINDINGS: Minimally displaced fracture of the distal clavicle, lateral to the coracoclavicular ligament insertion. The acromioclavicular joint remains congruent. Normal glenohumeral alignment with mild osteoarthritis. Soft tissue edema seen adjacent of the fracture site. IMPRESSION: Minimally displaced distal clavicle fracture. Electronically Signed   By: Jeb Levering M.D.   On: 11/15/2016 00:26    ____________________________________________   PROCEDURES  Procedure(s) performed: None  Procedures  Critical Care performed:  No  ____________________________________________   INITIAL IMPRESSION / ASSESSMENT AND PLAN / ED COURSE  Pertinent labs & imaging results that were available during my care of the patient were reviewed by me and considered in my medical decision making (see chart for details).  83 year old male with Parkinson's dementia who presents status post mechanical fall with left neck and shoulder pain. Tylenol administered prior to arrival; pain currently improved. Blood pressure improved to 180s/80s. Will obtain CT cervical spine and plain film x-rays of left shoulder.  Updated patient and daughter of CT abdomen x-ray imaging results. We did discuss age indeterminate T2 compression fracture. Will place in shoulder sling and patient will follow-up with orthopedics. Strict return prescriptions given. Both verbalize understanding and agree with plan of care.      ____________________________________________   FINAL CLINICAL IMPRESSION(S) / ED DIAGNOSES  Final diagnoses:  Fall, initial encounter  Closed displaced fracture of acromial  end of left clavicle, initial encounter      NEW MEDICATIONS STARTED DURING THIS VISIT:  Discharge Medication List as of 11/15/2016  1:16 AM    START taking these medications   Details  HYDROcodone-acetaminophen (NORCO) 5-325 MG tablet Take 1 tablet by mouth every 6 (six) hours as needed for moderate pain., Starting Sat 11/15/2016, Print         Note:  This document was prepared using Dragon voice recognition software and may include unintentional dictation errors.    Paulette Blanch, MD 11/15/16 [phone]

## 2016-11-14 NOTE — ED Notes (Addendum)
Report given to Rachel H. RN 

## 2016-11-15 DIAGNOSIS — S199XXA Unspecified injury of neck, initial encounter: Secondary | ICD-10-CM | POA: Diagnosis not present

## 2016-11-15 DIAGNOSIS — M542 Cervicalgia: Secondary | ICD-10-CM | POA: Diagnosis not present

## 2016-11-15 DIAGNOSIS — S42002A Fracture of unspecified part of left clavicle, initial encounter for closed fracture: Secondary | ICD-10-CM | POA: Diagnosis not present

## 2016-11-15 MED ORDER — HYDROCODONE-ACETAMINOPHEN 5-325 MG PO TABS: 1.0000 | ORAL_TABLET | Freq: Four times a day (QID) | ORAL | 0 refills | Status: DC | PRN

## 2016-11-15 NOTE — Discharge Instructions (Signed)
1. You may take Norco as needed for pain. 2. You may remove sling to bathe and sleep. 3. Return to the ER for worsening symptoms, persistent vomiting, difficult breathing or other concerns.

## 2016-11-17 DIAGNOSIS — R2689 Other abnormalities of gait and mobility: Secondary | ICD-10-CM | POA: Diagnosis not present

## 2016-11-17 DIAGNOSIS — G20 Parkinson's disease: Secondary | ICD-10-CM | POA: Diagnosis not present

## 2016-11-17 DIAGNOSIS — I10 Essential (primary) hypertension: Secondary | ICD-10-CM | POA: Diagnosis not present

## 2016-11-17 DIAGNOSIS — S42002S Fracture of unspecified part of left clavicle, sequela: Secondary | ICD-10-CM | POA: Diagnosis not present

## 2016-11-24 DIAGNOSIS — I714 Abdominal aortic aneurysm, without rupture: Secondary | ICD-10-CM | POA: Diagnosis not present

## 2016-11-24 DIAGNOSIS — R269 Unspecified abnormalities of gait and mobility: Secondary | ICD-10-CM | POA: Diagnosis not present

## 2016-11-24 DIAGNOSIS — I10 Essential (primary) hypertension: Secondary | ICD-10-CM | POA: Diagnosis not present

## 2016-11-24 DIAGNOSIS — G20 Parkinson's disease: Secondary | ICD-10-CM | POA: Diagnosis not present

## 2016-11-26 DIAGNOSIS — S42002A Fracture of unspecified part of left clavicle, initial encounter for closed fracture: Secondary | ICD-10-CM | POA: Diagnosis not present

## 2016-12-01 DIAGNOSIS — S80212S Abrasion, left knee, sequela: Secondary | ICD-10-CM | POA: Diagnosis not present

## 2016-12-01 DIAGNOSIS — I10 Essential (primary) hypertension: Secondary | ICD-10-CM | POA: Diagnosis not present

## 2016-12-01 DIAGNOSIS — S42002S Fracture of unspecified part of left clavicle, sequela: Secondary | ICD-10-CM | POA: Diagnosis not present

## 2016-12-01 DIAGNOSIS — N401 Enlarged prostate with lower urinary tract symptoms: Secondary | ICD-10-CM | POA: Diagnosis not present

## 2016-12-15 DIAGNOSIS — N189 Chronic kidney disease, unspecified: Secondary | ICD-10-CM | POA: Diagnosis not present

## 2016-12-15 DIAGNOSIS — I10 Essential (primary) hypertension: Secondary | ICD-10-CM | POA: Diagnosis not present

## 2016-12-15 DIAGNOSIS — N400 Enlarged prostate without lower urinary tract symptoms: Secondary | ICD-10-CM | POA: Diagnosis not present

## 2016-12-15 DIAGNOSIS — M5416 Radiculopathy, lumbar region: Secondary | ICD-10-CM | POA: Diagnosis not present

## 2016-12-15 DIAGNOSIS — S42002D Fracture of unspecified part of left clavicle, subsequent encounter for fracture with routine healing: Secondary | ICD-10-CM | POA: Diagnosis not present

## 2016-12-15 DIAGNOSIS — I129 Hypertensive chronic kidney disease with stage 1 through stage 4 chronic kidney disease, or unspecified chronic kidney disease: Secondary | ICD-10-CM | POA: Diagnosis not present

## 2016-12-15 DIAGNOSIS — M4312 Spondylolisthesis, cervical region: Secondary | ICD-10-CM | POA: Diagnosis not present

## 2016-12-15 DIAGNOSIS — N390 Urinary tract infection, site not specified: Secondary | ICD-10-CM | POA: Diagnosis not present

## 2016-12-15 DIAGNOSIS — B372 Candidiasis of skin and nail: Secondary | ICD-10-CM | POA: Diagnosis not present

## 2016-12-15 DIAGNOSIS — G20 Parkinson's disease: Secondary | ICD-10-CM | POA: Diagnosis not present

## 2016-12-16 DIAGNOSIS — G20 Parkinson's disease: Secondary | ICD-10-CM | POA: Diagnosis not present

## 2016-12-16 DIAGNOSIS — S42002D Fracture of unspecified part of left clavicle, subsequent encounter for fracture with routine healing: Secondary | ICD-10-CM | POA: Diagnosis not present

## 2016-12-24 DIAGNOSIS — S42002D Fracture of unspecified part of left clavicle, subsequent encounter for fracture with routine healing: Secondary | ICD-10-CM | POA: Diagnosis not present

## 2016-12-24 DIAGNOSIS — M898X1 Other specified disorders of bone, shoulder: Secondary | ICD-10-CM | POA: Diagnosis not present

## 2017-02-02 DIAGNOSIS — I10 Essential (primary) hypertension: Secondary | ICD-10-CM | POA: Diagnosis not present

## 2017-02-02 DIAGNOSIS — N189 Chronic kidney disease, unspecified: Secondary | ICD-10-CM | POA: Diagnosis not present

## 2017-02-02 DIAGNOSIS — H1031 Unspecified acute conjunctivitis, right eye: Secondary | ICD-10-CM | POA: Diagnosis not present

## 2017-02-02 DIAGNOSIS — R600 Localized edema: Secondary | ICD-10-CM | POA: Diagnosis not present

## 2017-02-03 DIAGNOSIS — E538 Deficiency of other specified B group vitamins: Secondary | ICD-10-CM | POA: Diagnosis not present

## 2017-02-03 DIAGNOSIS — Z79899 Other long term (current) drug therapy: Secondary | ICD-10-CM | POA: Diagnosis not present

## 2017-02-03 DIAGNOSIS — E559 Vitamin D deficiency, unspecified: Secondary | ICD-10-CM | POA: Diagnosis not present

## 2017-02-03 DIAGNOSIS — E785 Hyperlipidemia, unspecified: Secondary | ICD-10-CM | POA: Diagnosis not present

## 2017-02-16 DIAGNOSIS — H1033 Unspecified acute conjunctivitis, bilateral: Secondary | ICD-10-CM | POA: Diagnosis not present

## 2017-02-16 DIAGNOSIS — N189 Chronic kidney disease, unspecified: Secondary | ICD-10-CM | POA: Diagnosis not present

## 2017-02-16 DIAGNOSIS — I10 Essential (primary) hypertension: Secondary | ICD-10-CM | POA: Diagnosis not present

## 2017-02-16 DIAGNOSIS — Z8679 Personal history of other diseases of the circulatory system: Secondary | ICD-10-CM | POA: Diagnosis not present

## 2017-02-18 DIAGNOSIS — S42002D Fracture of unspecified part of left clavicle, subsequent encounter for fracture with routine healing: Secondary | ICD-10-CM | POA: Diagnosis not present

## 2017-02-23 NOTE — Telephone Encounter (Signed)
Close Encounter 

## 2017-03-05 DIAGNOSIS — B351 Tinea unguium: Secondary | ICD-10-CM | POA: Diagnosis not present

## 2017-03-05 DIAGNOSIS — M79676 Pain in unspecified toe(s): Secondary | ICD-10-CM | POA: Diagnosis not present

## 2017-03-05 DIAGNOSIS — R609 Edema, unspecified: Secondary | ICD-10-CM | POA: Diagnosis not present

## 2017-03-05 DIAGNOSIS — G629 Polyneuropathy, unspecified: Secondary | ICD-10-CM | POA: Diagnosis not present

## 2017-03-06 NOTE — Progress Notes (Signed)
[facility]  PATIENT: Ivan Clark DOB: 11/26/1928   REASON FOR VISIT: Follow-up for Parkinson's disease, cervical dystonia  HISTORY FROM: Patient    HISTORY OF PRESENT ILLNESS:YYHarold B Clark is 83 years old male, accompanied by his wife, initially seen in consultation by his primary care physician [name] for cervical dystonia, mild parkinsonian features. He has past medical history of bladder cancer, hypertension, prostate hypertrophy, over the past 20 years, he also developed gradual onset of anterocollois. Since 2012, wife noticed that he has slowed down significantly, had difficulty initiate gait, small shuffling gait, occasionally bilateral hands tremor, he also developed frequent lightheadedness, happen when he gets up quickly from sitting position, no passing out, consistent with a orthostatic blood pressure changes. He also has chronic constipation, acting out of dreams, loss of sense of smell for a long time period. He contributed to his loss of smell to his long time smoking history, he quit smoking 11 years ago, he used to smoke 2 packs a day. MRI of brain in August 2011: mild atrophy,small vessel disease, no acute lesions. MRA of brain was normal On examination he has mild parkinsonian features, Sinemet 25/100 3 times a day since 2012, he tolerated the medication well, there is mild improvement in his gait, he underwent emergency appendectomy in June 2013, recovered well from his surgery. He denies memory trouble. Selegiline since 04/2012, did very well, but had few episodes of dizziness,   Fainting spell in March 5th 2014, he got up early morning, around 5:45 AM, he was in bathroom, he felt funny after taking few steps, landed on the floor, he was taken to [name], was found to have orthostatic hypotension, was sent to the emergency room, reported normal CT scan. He continues to complain of lightheadedness when standing up quickly,  He  began to receive EMG guided butulinum toxin injection for his anterocollis and sialorrhea since March 19th 2014, the first one was in March 2014, the second injection was in July 2nd 2014, each time, he has mild improvement in straighting up his neck better, so he can walk better.  The third injection was October 10th 2014 , the same amount of xeomin 100 units total, 50 units to sternocleidal mastoid muscles, 5 days after the injection, he began to notice difficulty swallowing, especially with liquid, and hard solid food, eventually was admitted to hospital in February 28, 2013 swallowing study has demonstrated moderate pharyngeal and cervical esophageal dysphagia. He require NG tube for one day, dysphagia gradually resolved  He fell in April 09, 2013, with right hip fracture, he had a right hemiarthroplasty, recovered very well, is ambulating without assistance again, He is taking Sinemet IR 25/100 3 times a day, Sinemet CR 50/200 one tablet every night. He has no significant memory trouble, mild REM sleep disorder    UPDATE June 8th 2016:YY I have reviewed consult note from movement specialist [name] in March 2016, who concurred the diagnosis of mild Parkinson's disease, cervical dystonia, agree on current management of Sinemet immediate release 25/100 mg 3 times a day, Sinemet CR 50/200 mg once every night He has mild trouble swallowing, with big chunk solid food, he is still able to eat regular food, acting out of dreams, worsening anterocollis, still able to ambulate without assistance  His wife is using 24-hour oxygen now because of bronchiectasis   UPDATE 12/08/2016CM [name], 83 year old male returns for follow-up. He has a history of mild Parkinson's disease and cervical dystonia and  mild dysphagia. He can chock on certain foods such as grapes and he tries to avoid those. He has no problems with liquids. He denies any recent falls, he uses a cane for ambulation. He does some  stretching exercises. He does not perform any neck exercises. He reports that his memory is good, he is currently on Sinemet extended release at night and regular release 3 times a day before meals, he is also on Azilect. He returns for reevaluation. UPDATE 6/7/17CM  [name] 83 year old male returns for follow-up. He has a history of Parkinson's disease and cervical dystonia and mild dysphagia. Since last seen 6 months ago he has several falls with MRI from orthopedist in [place]  (epidural steroid 09-06-15 spinal nerve root block 2 wks ago5-23-17))  he has moved from his private home to living at [facility] in [place], [place].  Has [facility] orders to be signed for a hospital bed in a wheelchair. He reports that his appetite is better and he is chocking less. He has been set up for some physical therapy but that has not started. He reports no problems with his memory. He remains on Sinemet and Azilect. Minimal tremor seen today. He returns for reevaluation   UPDATE 05-04-23 2017:YY His wife passed away in 26-May-2015,  He lives is [facility] living, he is doing well.  He is going physical therapy.  His legs feel like magnet, now walking with a walker.  He is taking Sinemet 25/100 3 times a day, carbidopa levodopa ER 50/200 one tablet at bedtime, Azilect 1 mg daily, he was noted to have tongue protrusion, mild dyskinesia He sleeps on his back, sleeps well.  UPDATE Jul 09 2016:YY He fell 3 times since 05-25-2016, most recent was on July 07 2016, he finished using bathroom, standing up washing his hand, without clear triggers, he fell backwards, landed on his back, now with right side chest pain, and sternal pain upon deep breathing, He is taking Sinemet 25/100 mg one and half tablets 3 times a day, Azilect 1 mg daily, he tolerates the medication well, Today he was found to have irregular heart rhythm,  UPDATE Sep 10 2016:YY I was able to review his cardiology visit with Dr. Wende Bushy on July 16 2016, poor progression of R-wave on EKG, he does have risk factor of coronary artery disease including advanced age, hypertension, hyperlipidemia, proceed with stress testing, optimize blood pressure control, He fell on August 15 2016, he fell backward, no LOC, today's blood pressure was noted to be elevated, he has missed his morning dose of medications UPDATE 10/29/2018CM Mr. Loh 83 year old male returns for follow-up with his daughter. He has a history of  Parkinson's disease and cervical dystonia. He also has gait abnormality and has had 2 falls since last seen. He fell most recently in October and fractured his clavicle. He currently resides in assisted living facility. He requires assistance with activities of daily living except feeding himself. He goes to chair exercises 30 minutes 5 days a week. He ambulates with a rolling walker. Appetite is good and he is sleeping well. He remains on Azilect and Sinemet without side effects. He denies any hallucinations or confusion. Blood pressure elevated in the office today however he claims he has not taken his morning meds. He returns for reevaluation  REVIEW OF SYSTEMS: Full 14 system review of systems performed and notable only for those listed, all others are neg:  Constitutional: neg  Cardiovascular: neg Ear/Nose/Throat: neg  Skin:  neg Eyes: neg Respiratory: neg Gastroitestinal: neg  Hematology/Lymphatic: neg  Endocrine: neg Musculoskeletal: Gait abnormality Allergy/Immunology: neg Neurological: Cervical dystonia, Parkinson's disease Psychiatric: neg Sleep : neg   ALLERGIES: No Known Allergies  HOME MEDICATIONS: Outpatient Medications Prior to Visit  Medication Sig Dispense Refill  . acetaminophen (TYLENOL) 500 MG tablet Take 500 mg by mouth as needed.    [name] aspirin EC 81 MG tablet Take 1 tablet (81 mg total) by mouth daily after supper.    [name] atorvastatin (LIPITOR) 20 MG tablet Take 1 tablet (20 mg total) by mouth daily. 30 tablet  1  . carbidopa-levodopa (SINEMET IR) 25-100 MG tablet 1.5 tablets at 8, 12, 6:30pm 420 tablet 4  . cholecalciferol (VITAMIN D) 1000 units tablet Take 2,000 Units by mouth daily.     . finasteride (PROSCAR) 5 MG tablet   0  . fluticasone (FLONASE) 50 MCG/ACT nasal spray 1 spray daily as needed.  0  . irbesartan (AVAPRO) 150 MG tablet Take 1 tablet (150 mg total) by mouth 2 (two) times daily. (Patient taking differently: Take 150 mg by mouth daily. ) 60 tablet 1  . naproxen sodium (ANAPROX) 220 MG tablet Take 220 mg by mouth as needed.    Vladimir Faster Glycol-Propyl Glycol (SYSTANE) 0.4-0.3 % SOLN Apply to eye as needed.    . rasagiline (AZILECT) 1 MG TABS tablet Take 1 tablet (1 mg total) by mouth daily. 90 tablet 4  . tamsulosin (FLOMAX) 0.4 MG CAPS capsule Take 1 capsule (0.4 mg total) by mouth daily after supper. 30 capsule 1  . amLODipine (NORVASC) 5 MG tablet Take 10 mg by mouth daily.   4  . docusate sodium (COLACE) 100 MG capsule Take 100 mg by mouth 2 (two) times daily.    [name] HYDROcodone-acetaminophen (NORCO) 5-325 MG tablet Take 1 tablet by mouth every 6 (six) hours as needed for moderate pain. (Patient not taking: Reported on 03/09/2017) 15 tablet 0   Facility-Administered Medications Prior to Visit  Medication Dose Route Frequency Provider Last Rate Last Dose  . mitomycin (MUTAMYCIN) chemo injection 40 mg  40 mg Bladder Instillation Once Festus Aloe, MD        PAST MEDICAL HISTORY: Past Medical History:  Diagnosis Date  . Aneurysm of infrarenal abdominal aorta (HCC) SUPRAILIAC  4CM PER CT 10-20-2010--  STABLE  . Bladder cancer ([place]) RECURRENT  (FIRST DX  2001)   HX TURBT AND CHEMO BLADDER INSTILLATION TX'S  . Frequency of urination   . Hyperlipidemia   . Hypertension   . Nocturia   . Orthostatic hypotension   . Parkinson disease ([place])   . Peripheral neuropathy     PAST SURGICAL HISTORY: Past Surgical History:  Procedure Laterality Date  . APPENDECTOMY  10-20-2010  .  CYSTO/ BLADDER BX/ FULGERATION BLADDER TUMOR  10-09-2004  . CYSTO/ RETROGRADE PYELOGRAM/ BLADDER BX'S  07-03-2004  . CYSTOSCOPY WITH BIOPSY  11/28/2011   Procedure: CYSTOSCOPY WITH BIOPSY;  Surgeon: Fredricka Bonine, MD;  [facility];  Service: Urology;  Laterality: N/A;  . HIP ARTHROPLASTY Right 04/09/2013   Procedure: ARTHROPLASTY BIPOLAR HIP;  Surgeon: Augustin Schooling, MD;  Location: [facility];  Service: Orthopedics;  Laterality: Right;  . LEFT INGUINAL HERNIA REPAIR  1996  . TONSILLECTOMY AND ADENOIDECTOMY  1938  . TRANSURETHRAL RESECTION OF BLADDER TUMOR  09-11-1999   BLADDER CANCER  . VASECTOMY      FAMILY HISTORY: Family History  Problem Relation Age of Onset  . Alzheimer's  disease Mother   . Heart failure Father   . Kidney cancer Neg Hx   . Prostate cancer Neg Hx     SOCIAL HISTORY: Social History   Social History  . Marital status: Widowed    Spouse name: Katharine Look  . Number of children: 4  . Years of education: college   Occupational History  .      retired   Social History Main Topics  . Smoking status: Former Smoker    Packs/day: 2.00    Years: 50.00    Types: Cigarettes    Quit date: 05/13/1999  . Smokeless tobacco: Never Used  . Alcohol use No  . Drug use: No  . Sexual activity: Not on file   Other Topics Concern  . Not on file   Social History Narrative   Patient lives at [facility] in [place], [place]   Education- college education.   Retired.   Right handed.   Caffeine- Very Little     PHYSICAL EXAM  Vitals:   03/09/17 0802  BP: (!) 171/76  Pulse: 74  Weight: 155 lb (70.3 kg)  Height: 5\' 6"  (1.676 m)   Body mass index is 25.02 kg/m.  Generalized: Well developed, in no acute distress  Head: normocephalic and atraumatic,. Oropharynx benign  Neck: Cervical dystonia, no carotid bruits  Cardiac: Regular rate rhythm, no murmur  Musculoskeletal: No deformity   Neurological examination   Mentation: Alert  oriented to time, place, history taking. Attention span and concentration appropriate. Recent and remote memory intact.  Follows all commands speech and language fluent.   Cranial nerve II-XII: Pupils were equal round reactive to light extraocular movements were full, visual field were full on confrontational test. Facial sensation and strength were normal. hearing was intact to finger rubbing bilaterally. Uvula tongue midline. head turning and shoulder shrug were normal and symmetric.Tongue protrusion into cheek strength was normal. Motor: normal bulk and tone, full strength in the BUE, BLE, mild bilateral upper extremity rigidity right worse than left , severe anterocollis , neck not able to pass erect position  Sensory: normal and symmetric to light touch, pinprick, and  Vibration, in the upper and lower extremities Coordination: finger-nose-finger, heel-to-shin bilaterally, no dysmetria Reflexes: 1+ upper lower and symmetric, plantar responses were flexor bilaterally. Gait and Station: Rising up from seated position without assistance, small shuffling mildly unsteady gait , severe anterocollis, retropulsed instability, ambulates with walker DIAGNOSTIC DATA (LABS, IMAGING, TESTING) - I reviewed patient records, labs, notes, testing and imaging myself where available.      ASSESSMENT AND PLAN   83 y.o. year old male with Idiopathic Parkinson's disease, cervical dystonia, severe anterocollis, moderate left tilt, gait abnormality, frequent falls.  Keep Sinemet at current dose will refill Azilect 1 mg daily will refill Gait abnormality use walker at all times for safe ambulation at risk for falls Follow-up in 6 months I spent 25 minutes in total face to face time with the patient/daughter more than 50% of which was spent counseling and coordination of care, reviewing test results reviewing medications and discussing and reviewing the diagnosis of Parkinson's disease, cervical dystonia and  frequent falls. Uses night light in  the bathroom, uses grab bars, no throw rugs, clean of any spells, keep items in  easy reach, wear comfortable well fitting shoes and use her walker at all times Dennie Bible, [facility], [facility], APRN  [facility] [address] [place],  [address] [phone]

## 2017-03-09 ENCOUNTER — Encounter: Payer: Self-pay | Admitting: Nurse Practitioner

## 2017-03-09 ENCOUNTER — Ambulatory Visit (INDEPENDENT_AMBULATORY_CARE_PROVIDER_SITE_OTHER): Payer: Medicare Other | Admitting: Nurse Practitioner

## 2017-03-09 VITALS — BP 171/76 | HR 74 | Ht 66.0 in | Wt 155.0 lb

## 2017-03-09 DIAGNOSIS — R296 Repeated falls: Secondary | ICD-10-CM

## 2017-03-09 DIAGNOSIS — G243 Spasmodic torticollis: Secondary | ICD-10-CM | POA: Diagnosis not present

## 2017-03-09 DIAGNOSIS — G218 Other secondary parkinsonism: Secondary | ICD-10-CM | POA: Diagnosis not present

## 2017-03-09 DIAGNOSIS — K117 Disturbances of salivary secretion: Secondary | ICD-10-CM

## 2017-03-09 MED ORDER — CARBIDOPA-LEVODOPA 25-100 MG PO TABS: ORAL_TABLET | ORAL | 3 refills | Status: DC

## 2017-03-09 MED ORDER — RASAGILINE MESYLATE 1 MG PO TABS: 1.0000 mg | ORAL_TABLET | Freq: Every day | ORAL | 2 refills | Status: DC

## 2017-03-09 NOTE — Patient Instructions (Addendum)
Keep Sinemet at current dose will refill Azilect 1 mg daily will refill Gait abnormality use walker at all times for safe ambulation at risk for falls Follow-up in 6 months  Fall Prevention in the Home Falls can cause injuries. They can happen to people of all ages. There are many things you can do to make your home safe and to help prevent falls. What can I do on the outside of my home?  Regularly fix the edges of walkways and driveways and fix any cracks.  Remove anything that might make you trip as you walk through a door, such as a raised step or threshold.  Trim any bushes or trees on the path to your home.  Use bright outdoor lighting.  Clear any walking paths of anything that might make someone trip, such as rocks or tools.  Regularly check to see if handrails are loose or broken. Make sure that both sides of any steps have handrails.  Any raised decks and porches should have guardrails on the edges.  Have any leaves, snow, or ice cleared regularly.  Use sand or salt on walking paths during winter.  Clean up any spills in your garage right away. This includes oil or grease spills. What can I do in the bathroom?  Use night lights.  Install grab bars by the toilet and in the tub and shower. Do not use towel bars as grab bars.  Use non-skid mats or decals in the tub or shower.  If you need to sit down in the shower, use a plastic, non-slip stool.  Keep the floor dry. Clean up any water that spills on the floor as soon as it happens.  Remove soap buildup in the tub or shower regularly.  Attach bath mats securely with double-sided non-slip rug tape.  Do not have throw rugs and other things on the floor that can make you trip. What can I do in the bedroom?  Use night lights.  Make sure that you have a light by your bed that is easy to reach.  Do not use any sheets or blankets that are too big for your bed. They should not hang down onto the floor.  Have a firm  chair that has side arms. You can use this for support while you get dressed.  Do not have throw rugs and other things on the floor that can make you trip. What can I do in the kitchen?  Clean up any spills right away.  Avoid walking on wet floors.  Keep items that you use a lot in easy-to-reach places.  If you need to reach something above you, use a strong step stool that has a grab bar.  Keep electrical cords out of the way.  Do not use floor polish or wax that makes floors slippery. If you must use wax, use non-skid floor wax.  Do not have throw rugs and other things on the floor that can make you trip. What can I do with my stairs?  Do not leave any items on the stairs.  Make sure that there are handrails on both sides of the stairs and use them. Fix handrails that are broken or loose. Make sure that handrails are as long as the stairways.  Check any carpeting to make sure that it is firmly attached to the stairs. Fix any carpet that is loose or worn.  Avoid having throw rugs at the top or bottom of the stairs. If you do have throw rugs,  attach them to the floor with carpet tape.  Make sure that you have a light switch at the top of the stairs and the bottom of the stairs. If you do not have them, ask someone to add them for you. What else can I do to help prevent falls?  Wear shoes that: ? Do not have high heels. ? Have rubber bottoms. ? Are comfortable and fit you well. ? Are closed at the toe. Do not wear sandals.  If you use a stepladder: ? Make sure that it is fully opened. Do not climb a closed stepladder. ? Make sure that both sides of the stepladder are locked into place. ? Ask someone to hold it for you, if possible.  Clearly mark and make sure that you can see: ? Any grab bars or handrails. ? First and last steps. ? Where the edge of each step is.  Use tools that help you move around (mobility aids) if they are needed. These  include: ? Canes. ? Walkers. ? Scooters. ? Crutches.  Turn on the lights when you go into a dark area. Replace any light bulbs as soon as they burn out.  Set up your furniture so you have a clear path. Avoid moving your furniture around.  If any of your floors are uneven, fix them.  If there are any pets around you, be aware of where they are.  Review your medicines with your doctor. Some medicines can make you feel dizzy. This can increase your chance of falling. Ask your doctor what other things that you can do to help prevent falls. This information is not intended to replace advice given to you by your health care provider. Make sure you discuss any questions you have with your health care provider. Document Released: 02/22/2009 Document Revised: 10/04/2015 Document Reviewed: 06/02/2014 Elsevier Interactive Patient Education  [employer].

## 2017-03-10 NOTE — Progress Notes (Signed)
I have reviewed and agreed above plan. 

## 2017-03-11 DIAGNOSIS — Z23 Encounter for immunization: Secondary | ICD-10-CM | POA: Diagnosis not present

## 2017-03-13 ENCOUNTER — Ambulatory Visit: Payer: Medicare Other | Admitting: Nurse Practitioner

## 2017-03-30 ENCOUNTER — Encounter: Payer: Self-pay | Admitting: Urology

## 2017-03-30 ENCOUNTER — Ambulatory Visit: Payer: Medicare Other | Admitting: Urology

## 2017-03-30 VITALS — BP 115/54 | HR 64 | Ht 66.0 in | Wt 155.1 lb

## 2017-03-30 DIAGNOSIS — R351 Nocturia: Secondary | ICD-10-CM

## 2017-03-30 DIAGNOSIS — Z8551 Personal history of malignant neoplasm of bladder: Secondary | ICD-10-CM

## 2017-03-30 DIAGNOSIS — N138 Other obstructive and reflux uropathy: Secondary | ICD-10-CM

## 2017-03-30 DIAGNOSIS — N401 Enlarged prostate with lower urinary tract symptoms: Secondary | ICD-10-CM

## 2017-03-30 MED ORDER — LIDOCAINE HCL 2 % EX GEL
1.0000 "application " | Freq: Once | CUTANEOUS | Status: AC
  Administered 2017-03-30: 1 via URETHRAL

## 2017-03-30 MED ORDER — CIPROFLOXACIN HCL 500 MG PO TABS
500.0000 mg | ORAL_TABLET | Freq: Once | ORAL | Status: AC
  Administered 2017-03-30: 500 mg via ORAL

## 2017-03-30 NOTE — Progress Notes (Signed)
   03/30/17  CC:  Chief Complaint  Patient presents with  . Cysto    HPI:  1) Bladder Ca - Jul 2013 LGTa on bbx with [facility]. Prior 2001 resection, 2006 resection - treated with BCG.  Last upper tract: Abd U/S Sept 2010 Last cystoscopy: Mar 2016  2) BPH - patient on combination therapy tamsulosin and finasteride. He tried Avodart, doxazosin and Rapaflo in the past. Neurogenic risk includes Parkinson's. -Jun 2012 PSA 0.45 -Aug 2017 - nl DRE  He presents today for cysto. He has no dysuria or gross hematuria. Voiding well. Occasional nocturia and urgency.    Blood pressure (!) 115/54, pulse 64, height 5\' 6"  (1.676 m), weight 70.4 kg (155 lb 1.6 oz). NED. A&Ox3.   No respiratory distress   Abd soft, NT, ND Normal phallus with bilateral descended testicles  Cystoscopy Procedure Note  Patient identification was confirmed, informed consent was obtained, and patient was prepped using Betadine solution.  Lidocaine jelly was administered per urethral meatus.    Preoperative abx where received prior to procedure.     Pre-Procedure: - Inspection reveals a normal caliber ureteral meatus.  Procedure: The flexible cystoscope was introduced without difficulty - No urethral strictures/lesions are present. -  Enlarged prostate, no median lobe -  bladder neck patent  - Bilateral ureteral orifices identified - Bladder mucosa  reveals no ulcers, tumors, or lesions - No bladder stones - mild trabeculation  Retroflexion shows normal bladder neck    Post-Procedure: - Patient tolerated the procedure well  Assessment/ Plan:  Bladder ca _ normal cysto today.  BPH with LUTS - continue combination therapy.

## 2017-04-06 DIAGNOSIS — S80212A Abrasion, left knee, initial encounter: Secondary | ICD-10-CM | POA: Diagnosis not present

## 2017-04-06 DIAGNOSIS — I9589 Other hypotension: Secondary | ICD-10-CM | POA: Diagnosis not present

## 2017-04-06 DIAGNOSIS — R2689 Other abnormalities of gait and mobility: Secondary | ICD-10-CM | POA: Diagnosis not present

## 2017-04-06 DIAGNOSIS — N401 Enlarged prostate with lower urinary tract symptoms: Secondary | ICD-10-CM | POA: Diagnosis not present

## 2017-04-07 ENCOUNTER — Other Ambulatory Visit: Payer: Medicare Other

## 2017-04-08 ENCOUNTER — Emergency Department
Admission: EM | Admit: 2017-04-08 | Discharge: 2017-04-08 | Disposition: A | Discharge status: Home / Self Care | Payer: Medicare Other | Attending: Emergency Medicine | Admitting: Emergency Medicine

## 2017-04-08 ENCOUNTER — Emergency Department: Payer: Medicare Other

## 2017-04-08 ENCOUNTER — Encounter: Payer: Self-pay | Admitting: Emergency Medicine

## 2017-04-08 ENCOUNTER — Other Ambulatory Visit: Payer: Self-pay

## 2017-04-08 DIAGNOSIS — T148XXA Other injury of unspecified body region, initial encounter: Secondary | ICD-10-CM | POA: Insufficient documentation

## 2017-04-08 DIAGNOSIS — W010XXA Fall on same level from slipping, tripping and stumbling without subsequent striking against object, initial encounter: Secondary | ICD-10-CM | POA: Diagnosis not present

## 2017-04-08 DIAGNOSIS — Z87891 Personal history of nicotine dependence: Secondary | ICD-10-CM | POA: Diagnosis not present

## 2017-04-08 DIAGNOSIS — S0101XA Laceration without foreign body of scalp, initial encounter: Secondary | ICD-10-CM | POA: Insufficient documentation

## 2017-04-08 DIAGNOSIS — Y929 Unspecified place or not applicable: Secondary | ICD-10-CM | POA: Diagnosis not present

## 2017-04-08 DIAGNOSIS — W19XXXA Unspecified fall, initial encounter: Secondary | ICD-10-CM | POA: Diagnosis not present

## 2017-04-08 DIAGNOSIS — M25522 Pain in left elbow: Secondary | ICD-10-CM | POA: Diagnosis not present

## 2017-04-08 DIAGNOSIS — I10 Essential (primary) hypertension: Secondary | ICD-10-CM | POA: Diagnosis not present

## 2017-04-08 DIAGNOSIS — Z79899 Other long term (current) drug therapy: Secondary | ICD-10-CM | POA: Diagnosis not present

## 2017-04-08 DIAGNOSIS — Y9301 Activity, walking, marching and hiking: Secondary | ICD-10-CM | POA: Insufficient documentation

## 2017-04-08 DIAGNOSIS — M79642 Pain in left hand: Secondary | ICD-10-CM

## 2017-04-08 DIAGNOSIS — Y999 Unspecified external cause status: Secondary | ICD-10-CM | POA: Insufficient documentation

## 2017-04-08 DIAGNOSIS — S60512A Abrasion of left hand, initial encounter: Secondary | ICD-10-CM | POA: Diagnosis not present

## 2017-04-08 DIAGNOSIS — S0990XA Unspecified injury of head, initial encounter: Secondary | ICD-10-CM

## 2017-04-08 DIAGNOSIS — T07XXXA Unspecified multiple injuries, initial encounter: Secondary | ICD-10-CM

## 2017-04-08 DIAGNOSIS — G20 Parkinson's disease: Secondary | ICD-10-CM | POA: Diagnosis not present

## 2017-04-08 DIAGNOSIS — Z7982 Long term (current) use of aspirin: Secondary | ICD-10-CM | POA: Diagnosis not present

## 2017-04-08 DIAGNOSIS — S59902A Unspecified injury of left elbow, initial encounter: Secondary | ICD-10-CM | POA: Diagnosis not present

## 2017-04-08 MED ORDER — IBUPROFEN 600 MG PO TABS
600.0000 mg | ORAL_TABLET | Freq: Once | ORAL | Status: AC
  Administered 2017-04-08: 600 mg via ORAL
  Filled 2017-04-08: qty 1

## 2017-04-08 MED ORDER — TETANUS-DIPHTH-ACELL PERTUSSIS 5-2.5-18.5 LF-MCG/0.5 IM SUSP
0.5000 mL | Freq: Once | INTRAMUSCULAR | Status: AC
  Administered 2017-04-08: 0.5 mL via INTRAMUSCULAR
  Filled 2017-04-08: qty 0.5

## 2017-04-08 MED ORDER — LIDOCAINE-PRILOCAINE 2.5-2.5 % EX CREA
TOPICAL_CREAM | CUTANEOUS | Status: AC
  Filled 2017-04-08: qty 5

## 2017-04-08 MED ORDER — LIDOCAINE-PRILOCAINE 2.5-2.5 % EX CREA
TOPICAL_CREAM | Freq: Once | CUTANEOUS | Status: DC
  Filled 2017-04-08: qty 5

## 2017-04-08 NOTE — ED Notes (Signed)
This RN assisted pt to wheelchair upon discharge. Pt at baseline for his ability to walk, per family. A&O. Skin warm and dry. Pt and family verbalized understanding of discharge instructions and follow-up care. VSS.

## 2017-04-08 NOTE — ED Notes (Signed)
EDP at bedside, TV turned on, family at bedside

## 2017-04-08 NOTE — Discharge Instructions (Signed)
You were seen in the emergency department after a fall. Luckily all of your imaging studies did not show any evidence of injuries. Follow-up with you doctor within the next 2-3 days for further evaluation. Sometimes injuries can present at a later time and therefore it is imperative that you return to the emergency room if you have a severe headache, facial droop, neck pain, numbness or weakness of your extremities, slurred speech, difficulty finding words, chest pain, back pain, abdominal pain, or any other new symptoms that were not present during this visit. You may take Tylenol at home for your pain.  Keep laceration dry and clean. Wash with warm water and soap. Apply topical bacitracin. Protect from the sun to minimize scarring. Cover it with SPF 94 or higher and use hat when out in the sun for 6-9 months. You received 6 staples that must be removed in 7-10 days.  Watch for signs of infection: pus, redness of the skin surrounding it, or fever. If these develop see your doctor or return to the ER for antibiotics.

## 2017-04-08 NOTE — ED Triage Notes (Signed)
[facility] for [facility]. Pt with hx of falls. Per pt falls related to parkinsons. Pt a/o.

## 2017-04-08 NOTE — ED Provider Notes (Signed)
[facility] Emergency Department Provider Note  ____________________________________________  Time seen: Approximately 11:55 AM  I have reviewed the triage vital signs and the nursing notes.   HISTORY  Chief Complaint Fall   HPI Ivan Clark is a 83 y.o. male with a history of Parkinson's, bladder cancer, hypertension, peripheral neuropathy who presents for evaluation after a fall. Patient reports that he has a history of several falls due to his Parkinson's. He was walking when he lost his balance and fell. He hit his head on the ground. No LOC. Patient did sustain a scalp laceration. Last tetanus shot is unknown. Patient denies any headache or changes in vision. Denies neck pain, back pain, chest pain, abdominal pain, or extremity pain. Patient has several skin tears on his left upper extremity including hand and elbow which also looks swollen and bruised. He has a walker but was not using before the fall.patient is not on blood thinners.  Past Medical History:  Diagnosis Date  . Aneurysm of infrarenal abdominal aorta (HCC) SUPRAILIAC  4CM PER CT 10-20-2010--  STABLE  . Bladder cancer ([place]) RECURRENT  (FIRST DX  2001)   HX TURBT AND CHEMO BLADDER INSTILLATION TX'S  . Frequency of urination   . Hyperlipidemia   . Hypertension   . Nocturia   . Orthostatic hypotension   . Parkinson disease ([place])   . Peripheral neuropathy     Patient Active Problem List   Diagnosis Date Noted  . Fall 09/10/2016  . Frequent falls 10/17/2015  . Fracture of femoral neck, right ([place]) 04/14/2013  . Right femoral fracture ([place]) 04/09/2013  . Hypertension 04/09/2013  . Hyperlipidemia 04/09/2013  . BPH (benign prostatic hypertrophy) 04/09/2013  . Closed right hip fracture ([place]) 04/09/2013  . Hip fracture, right ([place]) 04/09/2013  . Femoral neck fracture ([place]) 04/09/2013  . Dysphagia, pharyngeal 03/02/2013  . Cervical dystonia 07/28/2012  . Sialorrhea 07/28/2012  .  Personal history of malignant neoplasm of bladder 07/27/2012  . Essential hypertension 07/27/2012  . Dizziness and giddiness 07/27/2012  . Parkinsonism ([place]) 07/27/2012    Past Surgical History:  Procedure Laterality Date  . APPENDECTOMY  10-20-2010  . CYSTO/ BLADDER BX/ FULGERATION BLADDER TUMOR  10-09-2004  . CYSTO/ RETROGRADE PYELOGRAM/ BLADDER BX'S  07-03-2004  . CYSTOSCOPY WITH BIOPSY  11/28/2011   Procedure: CYSTOSCOPY WITH BIOPSY;  Surgeon: Fredricka Bonine, MD;  Location: [facility];  Service: Urology;  Laterality: N/A;  . HIP ARTHROPLASTY Right 04/09/2013   Procedure: ARTHROPLASTY BIPOLAR HIP;  Surgeon: Augustin Schooling, MD;  Location: [facility];  Service: Orthopedics;  Laterality: Right;  . LEFT INGUINAL HERNIA REPAIR  1996  . TONSILLECTOMY AND ADENOIDECTOMY  1938  . TRANSURETHRAL RESECTION OF BLADDER TUMOR  09-11-1999   BLADDER CANCER  . VASECTOMY      Prior to Admission medications   Medication Sig Start Date End Date Taking? Authorizing Provider  amLODipine (NORVASC) 10 MG tablet Take 10 mg by mouth daily.  02/26/17  Yes [provider]  aspirin EC 81 MG tablet Take 1 tablet (81 mg total) by mouth daily after supper. 04/22/13  Yes Angiulli, Lavon Paganini, PA-C  atorvastatin (LIPITOR) 20 MG tablet Take 1 tablet (20 mg total) by mouth daily. 04/22/13  Yes Angiulli, Lavon Paganini, PA-C  carbidopa-levodopa (SINEMET IR) 25-100 MG tablet 1.5 tablets at 8, 12, 6:30pm Patient taking differently: Take 0.5 tablets by mouth 3 (three) times daily.  03/09/17  Yes Dennie Bible, NP  cholecalciferol (  VITAMIN D) 1000 units tablet Take 2,000 Units by mouth daily.    Yes [provider]  finasteride (PROSCAR) 5 MG tablet Take 5 mg by mouth daily.  05/22/14  Yes [provider]  irbesartan (AVAPRO) 150 MG tablet Take 1 tablet (150 mg total) by mouth 2 (two) times daily. Patient taking differently: Take 150 mg by mouth daily.  04/22/13  Yes Angiulli,  Lavon Paganini, PA-C  metoprolol succinate (TOPROL-XL) 25 MG 24 hr tablet Take 25 mg by mouth daily.  02/23/17  Yes [provider]  rasagiline (AZILECT) 1 MG TABS tablet Take 1 tablet (1 mg total) by mouth daily. 03/09/17  Yes Dennie Bible, NP  sennosides-docusate sodium (SENOKOT-S) 8.6-50 MG tablet Take 1 tablet by mouth daily.   Yes [provider]  tamsulosin (FLOMAX) 0.4 MG CAPS capsule Take 1 capsule (0.4 mg total) by mouth daily after supper. 04/22/13  Yes Angiulli, Lavon Paganini, PA-C  acetaminophen (TYLENOL) 500 MG tablet Take 500 mg by mouth as needed.    [provider]  fluticasone (FLONASE) 50 MCG/ACT nasal spray 1 spray daily as needed. 06/02/16   [provider]  naproxen sodium (ANAPROX) 220 MG tablet Take 220 mg by mouth as needed.    [provider]  Polyethyl Glycol-Propyl Glycol (SYSTANE) 0.4-0.3 % SOLN Apply to eye as needed.    [provider]    Allergies Patient has no known allergies.  Family History  Problem Relation Age of Onset  . Alzheimer's disease Mother   . Heart failure Father   . Kidney cancer Neg Hx   . Prostate cancer Neg Hx     Social History Social History   Tobacco Use  . Smoking status: Former Smoker    Packs/day: 2.00    Years: 50.00    Pack years: 100.00    Types: Cigarettes    Last attempt to quit: 05/13/1999    Years since quitting: 17.9  . Smokeless tobacco: Never Used  Substance Use Topics  . Alcohol use: No    Alcohol/week: 0.0 oz  . Drug use: No    Review of Systems Constitutional: Negative for fever. Eyes: Negative for visual changes. ENT: Negative for facial injury or neck injury Cardiovascular: Negative for chest injury. Respiratory: Negative for shortness of breath. Negative for chest wall injury. Gastrointestinal: Negative for abdominal pain or injury. Genitourinary: Negative for dysuria. Musculoskeletal: Negative for back injury, negative for arm or leg pain. +  bruises on the L hand and elbow Skin: + scalp laceration. Neurological: + head injury.  ____________________________________________   PHYSICAL EXAM:  VITAL SIGNS: ED Triage Vitals [04/08/17 1043]  Enc Vitals Group     BP 126/71     Pulse Rate 65     Resp      Temp 97.8 F (36.6 C)     Temp Source Oral     SpO2 98 %     Weight 159 lb 7 oz (72.3 kg)     Height 5\' 6"  (1.676 m)     Head Circumference      Peak Flow      Pain Score      Pain Loc      Pain Edu?      Excl. in [place]?    Constitutional: Alert and oriented. No acute distress. Does not appear intoxicated. HEENT Head: Normocephalic, linear laceration on the scalp Face: No facial bony tenderness. Stable midface Ears: No hemotympanum bilaterally. No Battle sign Eyes: No eye  injury. PERRL. No raccoon eyes Nose: Nontender. No epistaxis. No rhinorrhea Mouth/Throat: Mucous membranes are moist. No oropharyngeal blood. No dental injury. Airway patent without stridor. Normal voice. Neck: no C-collar in place. No midline c-spine tenderness.  Cardiovascular: Normal rate, regular rhythm. Normal and symmetric distal pulses are present in all extremities. Pulmonary/Chest: Chest wall is stable and nontender to palpation/compression. Normal respiratory effort. Breath sounds are normal. No crepitus.  Abdominal: Soft, nontender, non distended. Musculoskeletal: bruising and skin tear on the L 5th finger and L elbow with full painless ROM. Nontender with normal full range of motion in all other extremities. No deformities. No thoracic or lumbar midline spinal tenderness. Pelvis is stable. Skin: Skin is warm, dry and intact. No abrasions or contutions. Psychiatric: Speech and behavior are appropriate. Neurological: Normal speech and language. Moves all extremities to command. No gross focal neurologic deficits are appreciated.  Glascow Coma Score: 4 - Opens eyes on own 6 - Follows simple motor commands 5 - Alert and oriented GCS:  15   ____________________________________________   LABS (all labs ordered are listed, but only abnormal results are displayed)  Labs Reviewed - No data to display ____________________________________________  EKG  none  ____________________________________________  RADIOLOGY  CT head: No acute intracranial abnormality. Stable moderate cerebral atrophy and mild chronic microvascular ischemic white matter disease.  XR L hand: No acute osseous injury of the left hand.  XR L elbow:  No acute intracranial abnormality. Stable moderate cerebral atrophy and mild chronic microvascular ischemic white matter disease. ____________________________________________   PROCEDURES  Procedure(s) performed: yes .Marland KitchenLaceration Repair Date/Time: 04/08/2017 1:21 PM Performed by: Rudene Re, MD Authorized by: Rudene Re, MD   Consent:    Consent obtained:  Verbal   Consent given by:  Patient   Risks discussed:  Infection, pain, need for additional repair, poor cosmetic result and retained foreign body Anesthesia (see MAR for exact dosages):    Anesthesia method:  Topical application   Topical anesthetic:  EMLA cream Laceration details:    Location:  Scalp   Scalp location:  R parietal   Length (cm):  5 Repair type:    Repair type:  Simple Pre-procedure details:    Preparation:  Patient was prepped and draped in usual sterile fashion and imaging obtained to evaluate for foreign bodies Exploration:    Hemostasis achieved with:  Direct pressure   Wound exploration: entire depth of wound probed and visualized     Wound extent: no fascia violation noted and no foreign bodies/material noted     Contaminated: no   Treatment:    Area cleansed with:  Saline   Amount of cleaning:  Standard Skin repair:    Repair method:  Staples   Number of staples:  5 Approximation:    Approximation:  Close   Vermilion border: well-aligned   Post-procedure details:    Dressing:   Bulky dressing   Patient tolerance of procedure:  Tolerated well, no immediate complications      Critical Care performed:  None ____________________________________________   INITIAL IMPRESSION / ASSESSMENT AND PLAN / ED COURSE   83 y.o. male with a history of Parkinson's, bladder cancer, hypertension, peripheral neuropathy who presents for evaluation after a fall. Patient has a scalp laceration which was repaired per procedure note above. Head CT with no acute findings. Patient is currently at baseline per his daughter who is at the bedside. X-rays of the left hand and elbow are pending. Will renew tetanus. Skin tears were repaired with steristrips.  _________________________ 1:22 PM on 04/08/2017 -----------------------------------------  XRs negative for fracture or dislocation. Will dc back with wound care instructions, post fall instructions and close f/u with PCP.   As part of my medical decision making, I reviewed the following data within the [place] History obtained from family, Nursing notes reviewed and incorporated, Radiograph reviewed , Notes from prior ED visits and [place] Controlled Substance Database    Pertinent labs & imaging results that were available during my care of the patient were reviewed by me and considered in my medical decision making (see chart for details).    ____________________________________________   FINAL CLINICAL IMPRESSION(S) / ED DIAGNOSES  Final diagnoses:  Fall, initial encounter  Laceration of scalp, initial encounter  Injury of head, initial encounter  Left hand pain  Left elbow pain  Multiple abrasions      NEW MEDICATIONS STARTED DURING THIS VISIT:  ED Discharge Orders    None       Note:  This document was prepared using Dragon voice recognition software and may include unintentional dictation errors.    Rudene Re, MD 04/08/17 1323

## 2017-04-09 DIAGNOSIS — G629 Polyneuropathy, unspecified: Secondary | ICD-10-CM | POA: Diagnosis not present

## 2017-04-09 DIAGNOSIS — I951 Orthostatic hypotension: Secondary | ICD-10-CM | POA: Diagnosis not present

## 2017-04-09 DIAGNOSIS — Z9181 History of falling: Secondary | ICD-10-CM | POA: Diagnosis not present

## 2017-04-09 DIAGNOSIS — R296 Repeated falls: Secondary | ICD-10-CM | POA: Diagnosis not present

## 2017-04-09 DIAGNOSIS — I10 Essential (primary) hypertension: Secondary | ICD-10-CM | POA: Diagnosis not present

## 2017-04-09 DIAGNOSIS — G20 Parkinson's disease: Secondary | ICD-10-CM | POA: Diagnosis not present

## 2017-04-10 DIAGNOSIS — R3915 Urgency of urination: Secondary | ICD-10-CM | POA: Diagnosis not present

## 2017-04-10 DIAGNOSIS — R350 Frequency of micturition: Secondary | ICD-10-CM | POA: Diagnosis not present

## 2017-04-13 DIAGNOSIS — M546 Pain in thoracic spine: Secondary | ICD-10-CM | POA: Diagnosis not present

## 2017-04-13 DIAGNOSIS — I9589 Other hypotension: Secondary | ICD-10-CM | POA: Diagnosis not present

## 2017-04-13 DIAGNOSIS — S50312A Abrasion of left elbow, initial encounter: Secondary | ICD-10-CM | POA: Diagnosis not present

## 2017-04-13 DIAGNOSIS — R2689 Other abnormalities of gait and mobility: Secondary | ICD-10-CM | POA: Diagnosis not present

## 2017-04-13 DIAGNOSIS — G20 Parkinson's disease: Secondary | ICD-10-CM | POA: Diagnosis not present

## 2017-04-21 DIAGNOSIS — S0990XD Unspecified injury of head, subsequent encounter: Secondary | ICD-10-CM | POA: Diagnosis not present

## 2017-04-21 DIAGNOSIS — M47894 Other spondylosis, thoracic region: Secondary | ICD-10-CM | POA: Diagnosis not present

## 2017-04-21 DIAGNOSIS — S80212D Abrasion, left knee, subsequent encounter: Secondary | ICD-10-CM | POA: Diagnosis not present

## 2017-04-21 DIAGNOSIS — N400 Enlarged prostate without lower urinary tract symptoms: Secondary | ICD-10-CM | POA: Diagnosis not present

## 2017-05-05 DIAGNOSIS — S7002XA Contusion of left hip, initial encounter: Secondary | ICD-10-CM | POA: Diagnosis not present

## 2017-05-05 DIAGNOSIS — W19XXXA Unspecified fall, initial encounter: Secondary | ICD-10-CM | POA: Diagnosis not present

## 2017-05-08 DIAGNOSIS — M25552 Pain in left hip: Secondary | ICD-10-CM | POA: Diagnosis not present

## 2017-05-08 DIAGNOSIS — M79652 Pain in left thigh: Secondary | ICD-10-CM | POA: Diagnosis not present

## 2017-05-11 DIAGNOSIS — M25552 Pain in left hip: Secondary | ICD-10-CM | POA: Diagnosis not present

## 2017-05-11 DIAGNOSIS — G20 Parkinson's disease: Secondary | ICD-10-CM | POA: Diagnosis not present

## 2017-05-11 DIAGNOSIS — R54 Age-related physical debility: Secondary | ICD-10-CM | POA: Diagnosis not present

## 2017-05-11 DIAGNOSIS — R2689 Other abnormalities of gait and mobility: Secondary | ICD-10-CM | POA: Diagnosis not present

## 2017-05-14 DIAGNOSIS — B351 Tinea unguium: Secondary | ICD-10-CM | POA: Diagnosis not present

## 2017-05-14 DIAGNOSIS — M79676 Pain in unspecified toe(s): Secondary | ICD-10-CM | POA: Diagnosis not present

## 2017-05-14 DIAGNOSIS — G629 Polyneuropathy, unspecified: Secondary | ICD-10-CM | POA: Diagnosis not present

## 2017-05-18 DIAGNOSIS — M25552 Pain in left hip: Secondary | ICD-10-CM | POA: Diagnosis not present

## 2017-05-18 DIAGNOSIS — I10 Essential (primary) hypertension: Secondary | ICD-10-CM | POA: Diagnosis not present

## 2017-05-18 DIAGNOSIS — G908 Other disorders of autonomic nervous system: Secondary | ICD-10-CM | POA: Diagnosis not present

## 2017-05-18 DIAGNOSIS — R442 Other hallucinations: Secondary | ICD-10-CM | POA: Diagnosis not present

## 2017-05-25 DIAGNOSIS — R443 Hallucinations, unspecified: Secondary | ICD-10-CM | POA: Diagnosis not present

## 2017-05-25 DIAGNOSIS — R2689 Other abnormalities of gait and mobility: Secondary | ICD-10-CM | POA: Diagnosis not present

## 2017-05-25 DIAGNOSIS — B372 Candidiasis of skin and nail: Secondary | ICD-10-CM | POA: Diagnosis not present

## 2017-05-25 DIAGNOSIS — I10 Essential (primary) hypertension: Secondary | ICD-10-CM | POA: Diagnosis not present

## 2017-05-28 DIAGNOSIS — Z79899 Other long term (current) drug therapy: Secondary | ICD-10-CM | POA: Diagnosis not present

## 2017-06-04 DIAGNOSIS — Z79899 Other long term (current) drug therapy: Secondary | ICD-10-CM | POA: Diagnosis not present

## 2017-06-08 DIAGNOSIS — E860 Dehydration: Secondary | ICD-10-CM | POA: Diagnosis not present

## 2017-06-08 DIAGNOSIS — M25552 Pain in left hip: Secondary | ICD-10-CM | POA: Diagnosis not present

## 2017-06-08 DIAGNOSIS — L304 Erythema intertrigo: Secondary | ICD-10-CM | POA: Diagnosis not present

## 2017-06-08 DIAGNOSIS — S37009S Unspecified injury of unspecified kidney, sequela: Secondary | ICD-10-CM | POA: Diagnosis not present

## 2017-06-09 DIAGNOSIS — Z9181 History of falling: Secondary | ICD-10-CM | POA: Diagnosis not present

## 2017-06-09 DIAGNOSIS — G20 Parkinson's disease: Secondary | ICD-10-CM | POA: Diagnosis not present

## 2017-06-09 DIAGNOSIS — R296 Repeated falls: Secondary | ICD-10-CM | POA: Diagnosis not present

## 2017-06-09 DIAGNOSIS — I951 Orthostatic hypotension: Secondary | ICD-10-CM | POA: Diagnosis not present

## 2017-06-09 DIAGNOSIS — G629 Polyneuropathy, unspecified: Secondary | ICD-10-CM | POA: Diagnosis not present

## 2017-06-09 DIAGNOSIS — I10 Essential (primary) hypertension: Secondary | ICD-10-CM | POA: Diagnosis not present

## 2017-06-13 ENCOUNTER — Emergency Department: Payer: Medicare Other

## 2017-06-13 ENCOUNTER — Other Ambulatory Visit: Payer: Self-pay

## 2017-06-13 ENCOUNTER — Emergency Department
Admission: EM | Admit: 2017-06-13 | Discharge: 2017-06-13 | Disposition: A | Discharge status: Home / Self Care | Payer: Medicare Other | Attending: Emergency Medicine | Admitting: Emergency Medicine

## 2017-06-13 ENCOUNTER — Encounter: Payer: Self-pay | Admitting: Emergency Medicine

## 2017-06-13 DIAGNOSIS — Y92129 Unspecified place in nursing home as the place of occurrence of the external cause: Secondary | ICD-10-CM | POA: Insufficient documentation

## 2017-06-13 DIAGNOSIS — M25572 Pain in left ankle and joints of left foot: Secondary | ICD-10-CM | POA: Diagnosis not present

## 2017-06-13 DIAGNOSIS — S0001XA Abrasion of scalp, initial encounter: Secondary | ICD-10-CM | POA: Diagnosis not present

## 2017-06-13 DIAGNOSIS — Z7982 Long term (current) use of aspirin: Secondary | ICD-10-CM | POA: Diagnosis not present

## 2017-06-13 DIAGNOSIS — S99912A Unspecified injury of left ankle, initial encounter: Secondary | ICD-10-CM | POA: Diagnosis not present

## 2017-06-13 DIAGNOSIS — Z96642 Presence of left artificial hip joint: Secondary | ICD-10-CM | POA: Insufficient documentation

## 2017-06-13 DIAGNOSIS — G20 Parkinson's disease: Secondary | ICD-10-CM | POA: Insufficient documentation

## 2017-06-13 DIAGNOSIS — S61412A Laceration without foreign body of left hand, initial encounter: Secondary | ICD-10-CM | POA: Insufficient documentation

## 2017-06-13 DIAGNOSIS — Y939 Activity, unspecified: Secondary | ICD-10-CM | POA: Diagnosis not present

## 2017-06-13 DIAGNOSIS — Y999 Unspecified external cause status: Secondary | ICD-10-CM | POA: Diagnosis not present

## 2017-06-13 DIAGNOSIS — Z79899 Other long term (current) drug therapy: Secondary | ICD-10-CM | POA: Insufficient documentation

## 2017-06-13 DIAGNOSIS — S0003XA Contusion of scalp, initial encounter: Secondary | ICD-10-CM | POA: Diagnosis not present

## 2017-06-13 DIAGNOSIS — W19XXXA Unspecified fall, initial encounter: Secondary | ICD-10-CM | POA: Diagnosis not present

## 2017-06-13 DIAGNOSIS — I10 Essential (primary) hypertension: Secondary | ICD-10-CM | POA: Diagnosis not present

## 2017-06-13 DIAGNOSIS — S50812A Abrasion of left forearm, initial encounter: Secondary | ICD-10-CM | POA: Diagnosis not present

## 2017-06-13 DIAGNOSIS — Z87891 Personal history of nicotine dependence: Secondary | ICD-10-CM | POA: Insufficient documentation

## 2017-06-13 DIAGNOSIS — M7989 Other specified soft tissue disorders: Secondary | ICD-10-CM | POA: Diagnosis not present

## 2017-06-13 LAB — CBC WITH DIFFERENTIAL/PLATELET
BASOS ABS: 0.1 10*3/uL (ref 0–0.1)
BASOS PCT: 1 %
EOS PCT: 2 %
Eosinophils Absolute: 0.1 10*3/uL (ref 0–0.7)
HCT: 37.9 % — ABNORMAL LOW (ref 40.0–52.0)
Hemoglobin: 12.9 g/dL — ABNORMAL LOW (ref 13.0–18.0)
LYMPHS PCT: 18 %
Lymphs Abs: 1.6 10*3/uL (ref 1.0–3.6)
MCH: 31.8 pg (ref 26.0–34.0)
MCHC: 34 g/dL (ref 32.0–36.0)
MCV: 93.6 fL (ref 80.0–100.0)
MONO ABS: 0.6 10*3/uL (ref 0.2–1.0)
Monocytes Relative: 6 %
NEUTROS ABS: 6.5 10*3/uL (ref 1.4–6.5)
Neutrophils Relative %: 73 %
PLATELETS: 251 10*3/uL (ref 150–440)
RBC: 4.05 MIL/uL — AB (ref 4.40–5.90)
RDW: 13.9 % (ref 11.5–14.5)
WBC: 8.9 10*3/uL (ref 3.8–10.6)

## 2017-06-13 LAB — BASIC METABOLIC PANEL
ANION GAP: 7 (ref 5–15)
BUN: 43 mg/dL — ABNORMAL HIGH (ref 6–20)
CALCIUM: 8.8 mg/dL — AB (ref 8.9–10.3)
CO2: 24 mmol/L (ref 22–32)
Chloride: 108 mmol/L (ref 101–111)
Creatinine, Ser: 1.18 mg/dL (ref 0.61–1.24)
GFR, EST NON AFRICAN AMERICAN: 54 mL/min — AB (ref >60)
GLUCOSE: 98 mg/dL (ref 65–99)
POTASSIUM: 4.5 mmol/L (ref 3.5–5.1)
Sodium: 139 mmol/L (ref 135–145)

## 2017-06-13 LAB — TROPONIN I

## 2017-06-13 MED ORDER — BACITRACIN ZINC 500 UNIT/GM EX OINT
TOPICAL_OINTMENT | CUTANEOUS | Status: AC
  Filled 2017-06-13: qty 0.9

## 2017-06-13 NOTE — ED Notes (Signed)
Patient transported to CT 

## 2017-06-13 NOTE — ED Notes (Signed)
Pt assisted with urinal

## 2017-06-13 NOTE — ED Provider Notes (Signed)
[facility] Emergency Department Provider Note  Time seen: 11:47 AM  I have reviewed the triage vital signs and the nursing notes.   HISTORY  Chief Complaint Fall    HPI Ivan Clark is a 84 y.o. male with a past medical history of frequent falls, hypertension, hyperlipidemia, Parkinson's.  Patient was at his nursing facility, found down.  Patient does not recall the fall, pain is stating mild left ankle pain he does have an abrasion to his left hand as well as top of his scalp.  Patient denies any complaints besides mild left ankle pain.  Denies any headache, weakness or numbness.  Patient denies any chest or abdominal discomfort.  No nausea vomiting diarrhea.  Overall the patient appears well.   Past Medical History:  Diagnosis Date  . Aneurysm of infrarenal abdominal aorta (HCC) SUPRAILIAC  4CM PER CT 10-20-2010--  STABLE  . Bladder cancer ([place]) RECURRENT  (FIRST DX  2001)   HX TURBT AND CHEMO BLADDER INSTILLATION TX'S  . Frequency of urination   . Hyperlipidemia   . Hypertension   . Nocturia   . Orthostatic hypotension   . Parkinson disease ([place])   . Peripheral neuropathy     Patient Active Problem List   Diagnosis Date Noted  . Fall 09/10/2016  . Frequent falls 10/17/2015  . Fracture of femoral neck, right ([place]) 04/14/2013  . Right femoral fracture ([place]) 04/09/2013  . Hypertension 04/09/2013  . Hyperlipidemia 04/09/2013  . BPH (benign prostatic hypertrophy) 04/09/2013  . Closed right hip fracture ([place]) 04/09/2013  . Hip fracture, right ([place]) 04/09/2013  . Femoral neck fracture ([place]) 04/09/2013  . Dysphagia, pharyngeal 03/02/2013  . Cervical dystonia 07/28/2012  . Sialorrhea 07/28/2012  . Personal history of malignant neoplasm of bladder 07/27/2012  . Essential hypertension 07/27/2012  . Dizziness and giddiness 07/27/2012  . Parkinsonism ([place]) 07/27/2012    Past Surgical History:  Procedure Laterality Date  . APPENDECTOMY   10-20-2010  . CYSTO/ BLADDER BX/ FULGERATION BLADDER TUMOR  10-09-2004  . CYSTO/ RETROGRADE PYELOGRAM/ BLADDER BX'S  07-03-2004  . CYSTOSCOPY WITH BIOPSY  11/28/2011   Procedure: CYSTOSCOPY WITH BIOPSY;  Surgeon: Fredricka Bonine, MD;  Location: [facility];  Service: Urology;  Laterality: N/A;  . HIP ARTHROPLASTY Right 04/09/2013   Procedure: ARTHROPLASTY BIPOLAR HIP;  Surgeon: Augustin Schooling, MD;  Location: [facility];  Service: Orthopedics;  Laterality: Right;  . LEFT INGUINAL HERNIA REPAIR  1996  . TONSILLECTOMY AND ADENOIDECTOMY  1938  . TRANSURETHRAL RESECTION OF BLADDER TUMOR  09-11-1999   BLADDER CANCER  . VASECTOMY      Prior to Admission medications   Medication Sig Start Date End Date Taking? Authorizing Provider  acetaminophen (TYLENOL) 500 MG tablet Take 500 mg by mouth as needed.    [provider]  amLODipine (NORVASC) 10 MG tablet Take 10 mg by mouth daily.  02/26/17   [provider]  aspirin EC 81 MG tablet Take 1 tablet (81 mg total) by mouth daily after supper. 04/22/13   Angiulli, Lavon Paganini, PA-C  atorvastatin (LIPITOR) 20 MG tablet Take 1 tablet (20 mg total) by mouth daily. 04/22/13   Angiulli, Lavon Paganini, PA-C  carbidopa-levodopa (SINEMET IR) 25-100 MG tablet 1.5 tablets at 8, 12, 6:30pm Patient taking differently: Take 0.5 tablets by mouth 3 (three) times daily.  03/09/17   Dennie Bible, NP  cholecalciferol (VITAMIN D) 1000 units tablet Take 2,000 Units by mouth daily.  [provider]  finasteride (PROSCAR) 5 MG tablet Take 5 mg by mouth daily.  05/22/14   [provider]  fluticasone (FLONASE) 50 MCG/ACT nasal spray 1 spray daily as needed. 06/02/16   [provider]  irbesartan (AVAPRO) 150 MG tablet Take 1 tablet (150 mg total) by mouth 2 (two) times daily. Patient taking differently: Take 150 mg by mouth daily.  04/22/13   Angiulli, Lavon Paganini, PA-C  metoprolol succinate (TOPROL-XL) 25 MG 24 hr  tablet Take 25 mg by mouth daily.  02/23/17   [provider]  naproxen sodium (ANAPROX) 220 MG tablet Take 220 mg by mouth as needed.    [provider]  Polyethyl Glycol-Propyl Glycol (SYSTANE) 0.4-0.3 % SOLN Apply to eye as needed.    [provider]  rasagiline (AZILECT) 1 MG TABS tablet Take 1 tablet (1 mg total) by mouth daily. 03/09/17   Dennie Bible, NP  sennosides-docusate sodium (SENOKOT-S) 8.6-50 MG tablet Take 1 tablet by mouth daily.    [provider]  tamsulosin (FLOMAX) 0.4 MG CAPS capsule Take 1 capsule (0.4 mg total) by mouth daily after supper. 04/22/13   Angiulli, Lavon Paganini, PA-C    No Known Allergies  Family History  Problem Relation Age of Onset  . Alzheimer's disease Mother   . Heart failure Father   . Kidney cancer Neg Hx   . Prostate cancer Neg Hx     Social History Social History   Tobacco Use  . Smoking status: Former Smoker    Packs/day: 2.00    Years: 50.00    Pack years: 100.00    Types: Cigarettes    Last attempt to quit: 05/13/1999    Years since quitting: 18.0  . Smokeless tobacco: Never Used  Substance Use Topics  . Alcohol use: No    Alcohol/week: 0.0 oz  . Drug use: No    Review of Systems Constitutional: Negative for fever. Eyes: Negative for visual complaints ENT: Negative for recent illness Cardiovascular: Negative for chest pain. Respiratory: Negative for shortness of breath. Gastrointestinal: Negative for abdominal pain, vomiting and diarrhea. Genitourinary: Negative for urinary compaints Musculoskeletal: Patient states mild left ankle pain.  Has an abrasion to his scalp and skin tear to left hand. Skin: Abrasion of scalp, skin tear to left hand. Neurological: Negative for headache All other ROS negative  ____________________________________________   PHYSICAL EXAM:  VITAL SIGNS: ED Triage Vitals  Enc Vitals Group     BP 06/13/17 1054 (!) 156/61     Pulse Rate 06/13/17 1054 77      Resp 06/13/17 1054 (!) 24     Temp 06/13/17 1054 (!) 97.5 F (36.4 C)     Temp Source 06/13/17 1054 Oral     SpO2 06/13/17 1054 97 %     Weight 06/13/17 1052 160 lb (72.6 kg)     Height 06/13/17 1052 5\' 7"  (1.702 m)     Head Circumference --      Peak Flow --      Pain Score 06/13/17 1052 0     Pain Loc --      Pain Edu? --      Excl. in [place]? --    Constitutional: Alert. Well appearing and in no distress. Eyes: Normal exam ENT   Head: Normocephalic.  Small abrasion to top of the scalp.  No hematoma.   Mouth/Throat: Mucous membranes are moist. Cardiovascular: Normal rate, regular rhythm. Respiratory: Normal respiratory effort without tachypnea nor retractions.  Breath sounds are clear  Gastrointestinal: Soft and nontender. No distention.  Musculoskeletal: Nontender with normal range of motion in all extremities.  Good range of motion in all extremities including bilateral hips, nontender.  No C-spine tenderness. Neurologic:  Normal speech and language. No gross focal neurologic deficits.  Moves all extremities. Skin: Approximately 2 cm skin tear to the dorsal aspect of the left hand, hemostatic.  Small abrasion to the top of the scalp, hemostatic.  No laceration. Psychiatric: Mood and affect are normal.   ____________________________________________    EKG  EKG reviewed and interpreted by myself appears to show sinus rhythm at 77 bpm with a narrow QRS, normal axis, normal intervals, no concerning ST changes.  ____________________________________________    RADIOLOGY  Ankle negative CT head negative  ____________________________________________   INITIAL IMPRESSION / ASSESSMENT AND PLAN / ED COURSE  Pertinent labs & imaging results that were available during my care of the patient were reviewed by me and considered in my medical decision making (see chart for details).  Patient presents to the emergency department after an unwitnessed fall.  Only complaint is  mild left ankle pain.  Good range of motion in all extremities including left ankle without any tenderness elicited during my exam.  Small abrasion to anterior scalp and skin to the dorsal aspect of the left hand.  We will cover with Xeroform to the left hand.  Scalp is hemostatic we will cover with bacitracin.  CT scan of the head and x-ray of the left ankle are normal.  We will check lab work as the fall was unwitnessed and the patient does not recall the events surrounding the fall, as a precaution.  According to the record review patient does have frequent falls.  Daughter is here with the patient.  States his tetanus is up-to-date, believes he received a shot during his last visit.  Patient's medical workup is largely nonrevealing, labs are at baseline for the patient.  He continues to appear well.  We will cover his skin tear with Xeroform and a dressing.  Patient will be discharged back to his nursing facility.  Imaging is normal.  ____________________________________________   FINAL CLINICAL IMPRESSION(S) / ED DIAGNOSES  Fall Skin tear    Harvest Dark, MD 06/13/17 1343

## 2017-06-13 NOTE — ED Notes (Signed)
Pt back from CT

## 2017-06-13 NOTE — ED Notes (Signed)
xeroform applied to pt's left hand and wrapped with gauze. Bandaid applied to left pointer finger per pt request. Pt's head cleaned up and bacitracin applied to top of head. Pt dressed and assisted into wheelchair. Family at bedside

## 2017-06-13 NOTE — ED Triage Notes (Signed)
Pt from nursing facility. Had unwitnessed fall. Pt does not remember event or how he fell.  Pain initially to left ankle; mild swelling to medial side. Skin tear left hand. Abrasion with small knot top of head.

## 2017-06-20 ENCOUNTER — Emergency Department: Payer: Medicare Other

## 2017-06-20 ENCOUNTER — Encounter: Payer: Self-pay | Admitting: *Deleted

## 2017-06-20 ENCOUNTER — Observation Stay
Admission: EM | Admit: 2017-06-20 | Discharge: 2017-06-23 | Disposition: A | Discharge status: Skilled Nursing Facility | Payer: Medicare Other | Attending: Internal Medicine | Admitting: Internal Medicine

## 2017-06-20 ENCOUNTER — Other Ambulatory Visit: Payer: Self-pay

## 2017-06-20 DIAGNOSIS — Z8551 Personal history of malignant neoplasm of bladder: Secondary | ICD-10-CM | POA: Diagnosis not present

## 2017-06-20 DIAGNOSIS — I10 Essential (primary) hypertension: Secondary | ICD-10-CM | POA: Diagnosis not present

## 2017-06-20 DIAGNOSIS — Z87891 Personal history of nicotine dependence: Secondary | ICD-10-CM | POA: Diagnosis not present

## 2017-06-20 DIAGNOSIS — S61411A Laceration without foreign body of right hand, initial encounter: Principal | ICD-10-CM | POA: Insufficient documentation

## 2017-06-20 DIAGNOSIS — Z79899 Other long term (current) drug therapy: Secondary | ICD-10-CM | POA: Diagnosis not present

## 2017-06-20 DIAGNOSIS — R296 Repeated falls: Secondary | ICD-10-CM | POA: Diagnosis not present

## 2017-06-20 DIAGNOSIS — Z7982 Long term (current) use of aspirin: Secondary | ICD-10-CM | POA: Insufficient documentation

## 2017-06-20 DIAGNOSIS — R269 Unspecified abnormalities of gait and mobility: Secondary | ICD-10-CM | POA: Diagnosis not present

## 2017-06-20 DIAGNOSIS — I951 Orthostatic hypotension: Secondary | ICD-10-CM | POA: Diagnosis not present

## 2017-06-20 DIAGNOSIS — G20 Parkinson's disease: Secondary | ICD-10-CM | POA: Insufficient documentation

## 2017-06-20 DIAGNOSIS — L03112 Cellulitis of left axilla: Secondary | ICD-10-CM | POA: Diagnosis not present

## 2017-06-20 DIAGNOSIS — W19XXXA Unspecified fall, initial encounter: Secondary | ICD-10-CM | POA: Diagnosis not present

## 2017-06-20 DIAGNOSIS — G629 Polyneuropathy, unspecified: Secondary | ICD-10-CM | POA: Diagnosis not present

## 2017-06-20 DIAGNOSIS — L03114 Cellulitis of left upper limb: Secondary | ICD-10-CM | POA: Diagnosis present

## 2017-06-20 DIAGNOSIS — E785 Hyperlipidemia, unspecified: Secondary | ICD-10-CM | POA: Diagnosis not present

## 2017-06-20 DIAGNOSIS — L03113 Cellulitis of right upper limb: Secondary | ICD-10-CM | POA: Insufficient documentation

## 2017-06-20 DIAGNOSIS — N400 Enlarged prostate without lower urinary tract symptoms: Secondary | ICD-10-CM | POA: Diagnosis present

## 2017-06-20 DIAGNOSIS — M79641 Pain in right hand: Secondary | ICD-10-CM | POA: Diagnosis not present

## 2017-06-20 DIAGNOSIS — S6991XA Unspecified injury of right wrist, hand and finger(s), initial encounter: Secondary | ICD-10-CM | POA: Diagnosis not present

## 2017-06-20 LAB — CBC WITH DIFFERENTIAL/PLATELET
BASOS ABS: 0.1 10*3/uL (ref 0–0.1)
Basophils Relative: 1 %
Eosinophils Absolute: 0.1 10*3/uL (ref 0–0.7)
Eosinophils Relative: 1 %
HEMATOCRIT: 36.8 % — AB (ref 40.0–52.0)
HEMOGLOBIN: 12.3 g/dL — AB (ref 13.0–18.0)
LYMPHS ABS: 1.2 10*3/uL (ref 1.0–3.6)
LYMPHS PCT: 11 %
MCH: 31.1 pg (ref 26.0–34.0)
MCHC: 33.5 g/dL (ref 32.0–36.0)
MCV: 92.7 fL (ref 80.0–100.0)
Monocytes Absolute: 0.8 10*3/uL (ref 0.2–1.0)
Monocytes Relative: 7 %
NEUTROS ABS: 9.3 10*3/uL — AB (ref 1.4–6.5)
Neutrophils Relative %: 80 %
Platelets: 255 10*3/uL (ref 150–440)
RBC: 3.97 MIL/uL — AB (ref 4.40–5.90)
RDW: 13.6 % (ref 11.5–14.5)
WBC: 11.5 10*3/uL — AB (ref 3.8–10.6)

## 2017-06-20 LAB — COMPREHENSIVE METABOLIC PANEL
ALBUMIN: 3.8 g/dL (ref 3.5–5.0)
ALT: <5 U/L — ABNORMAL LOW (ref 17–63)
ANION GAP: 12 (ref 5–15)
AST: 21 U/L (ref 15–41)
Alkaline Phosphatase: 59 U/L (ref 38–126)
BILIRUBIN TOTAL: 1 mg/dL (ref 0.3–1.2)
BUN: 38 mg/dL — AB (ref 6–20)
CHLORIDE: 106 mmol/L (ref 101–111)
CO2: 22 mmol/L (ref 22–32)
Calcium: 9.3 mg/dL (ref 8.9–10.3)
Creatinine, Ser: 1.02 mg/dL (ref 0.61–1.24)
GFR calc Af Amer: >60 mL/min (ref >60)
Glucose, Bld: 113 mg/dL — ABNORMAL HIGH (ref 65–99)
POTASSIUM: 4.3 mmol/L (ref 3.5–5.1)
Sodium: 140 mmol/L (ref 135–145)
TOTAL PROTEIN: 7.3 g/dL (ref 6.5–8.1)

## 2017-06-20 LAB — LACTIC ACID, PLASMA: LACTIC ACID, VENOUS: 1.3 mmol/L (ref 0.5–1.9)

## 2017-06-20 MED ORDER — LIDOCAINE HCL (PF) 1 % IJ SOLN
10.0000 mL | Freq: Once | INTRAMUSCULAR | Status: AC
  Administered 2017-06-20: 10 mL via INTRADERMAL

## 2017-06-20 MED ORDER — LIDOCAINE HCL (PF) 1 % IJ SOLN
INTRAMUSCULAR | Status: AC
  Administered 2017-06-20: 10 mL via INTRADERMAL
  Filled 2017-06-20: qty 10

## 2017-06-20 MED ORDER — CEFAZOLIN SODIUM-DEXTROSE 1-4 GM/50ML-% IV SOLN
1.0000 g | Freq: Once | INTRAVENOUS | Status: AC
  Administered 2017-06-20: 1 g via INTRAVENOUS
  Filled 2017-06-20: qty 50

## 2017-06-20 NOTE — ED Provider Notes (Signed)
[facility] Emergency Department Provider Note ____________________________________________   First MD Initiated Contact with Patient 06/20/17 2224     (approximate)  I have reviewed the triage vital signs and the nursing notes.   HISTORY  Chief Complaint Wound Infection    HPI Ivan Clark is a 84 y.o. male with past medical history as noted below including Parkinson's disease and frequent falls who presents with left hand and distal arm swelling, gradual onset over the last several days, associated with redness, as well as with yellow drainage from a wound in the top of his hand.  The patient was seen in the ED 1 week ago after a fall and noted to have an abrasion on the top of the left hand, and the redness and pain emanated from this region.  Patient also reports a fall today in which he injured his right hand.  The patient denies fever chills, vomiting, difficulty breathing, or any numbness or weakness in the hands.  He denies hitting his head or injuring any other part of the body during the fall today.  Per the patient's son, the hand was noted to be abnormal several days ago, but by the time a medical provider was able to assess it today it had spread, so he decided to bring the patient to the ED. the patient has not gotten any antibiotics or other treatment for this.  Past Medical History:  Diagnosis Date  . Aneurysm of infrarenal abdominal aorta (HCC) SUPRAILIAC  4CM PER CT 10-20-2010--  STABLE  . Bladder cancer ([place]) RECURRENT  (FIRST DX  2001)   HX TURBT AND CHEMO BLADDER INSTILLATION TX'S  . Frequency of urination   . Hyperlipidemia   . Hypertension   . Nocturia   . Orthostatic hypotension   . Parkinson disease ([place])   . Peripheral neuropathy     Patient Active Problem List   Diagnosis Date Noted  . Cellulitis of left upper extremity 06/21/2017  . Fall 09/10/2016  . Frequent falls 10/17/2015  . Fracture of femoral neck, right ([place])  04/14/2013  . Right femoral fracture ([place]) 04/09/2013  . Hyperlipidemia 04/09/2013  . Benign prostatic hyperplasia 04/09/2013  . Closed right hip fracture ([place]) 04/09/2013  . Hip fracture, right ([place]) 04/09/2013  . Femoral neck fracture ([place]) 04/09/2013  . Dysphagia, pharyngeal 03/02/2013  . Cervical dystonia 07/28/2012  . Sialorrhea 07/28/2012  . Personal history of malignant neoplasm of bladder 07/27/2012  . Essential hypertension 07/27/2012  . Dizziness and giddiness 07/27/2012  . Parkinsonism ([place]) 07/27/2012    Past Surgical History:  Procedure Laterality Date  . APPENDECTOMY  10-20-2010  . CYSTO/ BLADDER BX/ FULGERATION BLADDER TUMOR  10-09-2004  . CYSTO/ RETROGRADE PYELOGRAM/ BLADDER BX'S  07-03-2004  . CYSTOSCOPY WITH BIOPSY  11/28/2011   Procedure: CYSTOSCOPY WITH BIOPSY;  Surgeon: Fredricka Bonine, MD;  Location: [facility];  Service: Urology;  Laterality: N/A;  . HIP ARTHROPLASTY Right 04/09/2013   Procedure: ARTHROPLASTY BIPOLAR HIP;  Surgeon: Augustin Schooling, MD;  Location: [place];  Service: Orthopedics;  Laterality: Right;  . LEFT INGUINAL HERNIA REPAIR  1996  . TONSILLECTOMY AND ADENOIDECTOMY  1938  . TRANSURETHRAL RESECTION OF BLADDER TUMOR  09-11-1999   BLADDER CANCER  . VASECTOMY      Prior to Admission medications   Medication Sig Start Date End Date Taking? Authorizing Provider  acetaminophen (TYLENOL) 500 MG tablet Take 500 mg by mouth as needed.    [provider]  amLODipine (NORVASC) 10 MG tablet Take 10 mg by mouth daily.  02/26/17   [provider]  aspirin EC 81 MG tablet Take 1 tablet (81 mg total) by mouth daily after supper. 04/22/13   Angiulli, Lavon Paganini, PA-C  atorvastatin (LIPITOR) 20 MG tablet Take 1 tablet (20 mg total) by mouth daily. 04/22/13   Angiulli, Lavon Paganini, PA-C  carbidopa-levodopa (SINEMET IR) 25-100 MG tablet 1.5 tablets at 8, 12, 6:30pm Patient taking differently: Take 0.5 tablets by mouth 3  (three) times daily.  03/09/17   Dennie Bible, NP  cholecalciferol (VITAMIN D) 1000 units tablet Take 2,000 Units by mouth daily.     [provider]  finasteride (PROSCAR) 5 MG tablet Take 5 mg by mouth daily.  05/22/14   [provider]  fluticasone (FLONASE) 50 MCG/ACT nasal spray 1 spray daily as needed. 06/02/16   [provider]  irbesartan (AVAPRO) 150 MG tablet Take 1 tablet (150 mg total) by mouth 2 (two) times daily. Patient taking differently: Take 150 mg by mouth daily.  04/22/13   Angiulli, Lavon Paganini, PA-C  metoprolol succinate (TOPROL-XL) 25 MG 24 hr tablet Take 25 mg by mouth daily.  02/23/17   [provider]  naproxen sodium (ANAPROX) 220 MG tablet Take 220 mg by mouth as needed.    [provider]  nystatin (MYCOSTATIN/NYSTOP) powder Apply 2 g topically daily. 05/25/17   [provider]  Polyethyl Glycol-Propyl Glycol (SYSTANE) 0.4-0.3 % SOLN Apply to eye as needed.    [provider]  rasagiline (AZILECT) 1 MG TABS tablet Take 1 tablet (1 mg total) by mouth daily. 03/09/17   Dennie Bible, NP  sennosides-docusate sodium (SENOKOT-S) 8.6-50 MG tablet Take 1 tablet by mouth daily.    [provider]  tamsulosin (FLOMAX) 0.4 MG CAPS capsule Take 1 capsule (0.4 mg total) by mouth daily after supper. 04/22/13   Angiulli, Lavon Paganini, PA-C    Allergies Patient has no known allergies.  Family History  Problem Relation Age of Onset  . Alzheimer's disease Mother   . Heart failure Father   . Kidney cancer Neg Hx   . Prostate cancer Neg Hx     Social History Social History   Tobacco Use  . Smoking status: Former Smoker    Packs/day: 2.00    Years: 50.00    Pack years: 100.00    Types: Cigarettes    Last attempt to quit: 05/13/1999    Years since quitting: 18.1  . Smokeless tobacco: Never Used  Substance Use Topics  . Alcohol use: No    Alcohol/week: 0.0 oz  . Drug use: No    Review of  Systems  Constitutional: No fever. Eyes: No redness. ENT: No sore throat. Cardiovascular: Denies chest pain. Respiratory: Denies shortness of breath. Gastrointestinal: No nausea, no vomiting.   Genitourinary: Negative for flank pain.  Musculoskeletal: Positive for left hand pain. Skin: Positive for skin tear/laceration to right hand. Neurological: Negative for focal weakness or numbness.   ____________________________________________   PHYSICAL EXAM:  VITAL SIGNS: ED Triage Vitals  Enc Vitals Group     BP 06/20/17 1728 (!) 165/90     Pulse Rate 06/20/17 1728 80     Resp 06/20/17 1728 16     Temp 06/20/17 1728 (!) 97.3 F (36.3 C)     Temp Source 06/20/17 1728 Oral     SpO2 06/20/17 1728 98 %     Weight 06/20/17 1729 160 lb (72.6  kg)     Height 06/20/17 1729 5\' 7"  (1.702 m)     Head Circumference --      Peak Flow --      Pain Score --      Pain Loc --      Pain Edu? --      Excl. in [place]? --     Constitutional: Alert and oriented.  Relatively well-appearing for age and in no acute distress. Eyes: Conjunctivae are normal.  Head: Atraumatic. Nose: No congestion/rhinnorhea. Mouth/Throat: Mucous membranes are slightly dry.   Neck: Normal range of motion.  Cervical spine nontender. Cardiovascular:  Good peripheral circulation. Respiratory: Normal respiratory effort.  No retractions.  Gastrointestinal: No distention.  Genitourinary: No flank tenderness. Musculoskeletal:  Extremities warm and well perfused.  Right hand with approximately 2 cm laceration at the base of the fifth digit with exposed tendon but with full range of motion to the joint in flexion and extension. Neurologic:  Normal speech and language. No gross focal neurologic deficits are appreciated.  Skin:  Skin is warm and dry.  Left hand with approximately 4 cm superficial appearing area of purulent drainage, with surrounding erythema to approximately halfway up the forearm, but with intact motor and sensory  in all distributions and full range of motion to the fingers.  2+ radial pulse. Psychiatric: Mood and affect are normal. Speech and behavior are normal.  ____________________________________________   LABS (all labs ordered are listed, but only abnormal results are displayed)  Labs Reviewed  COMPREHENSIVE METABOLIC PANEL - Abnormal; Notable for the following components:      Result Value   Glucose, Bld 113 (*)    BUN 38 (*)    ALT <5 (*)    All other components within normal limits  CBC WITH DIFFERENTIAL/PLATELET - Abnormal; Notable for the following components:   WBC 11.5 (*)    RBC 3.97 (*)    Hemoglobin 12.3 (*)    HCT 36.8 (*)    Neutro Abs 9.3 (*)    All other components within normal limits  CULTURE, BLOOD (ROUTINE X 2)  CULTURE, BLOOD (ROUTINE X 2)  LACTIC ACID, PLASMA   ____________________________________________  EKG   ____________________________________________  RADIOLOGY  XR right hand: No acute fracture XR left hand: No acute fracture  ____________________________________________   PROCEDURES  Procedure(s) performed: No  ..Laceration Repair Date/Time: 06/21/2017 12:17 AM Performed by: Arta Silence, MD Authorized by: Arta Silence, MD   Consent:    Consent obtained:  Verbal   Consent given by:  Patient   Risks discussed:  Pain, tendon damage and poor wound healing   Alternatives discussed:  No treatment Anesthesia (see MAR for exact dosages):    Anesthesia method:  Local infiltration   Local anesthetic:  Lidocaine 1% w/o epi Laceration details:    Location:  Hand   Hand location:  R hand, dorsum   Length (cm):  3 Repair type:    Repair type:  Intermediate Pre-procedure details:    Preparation:  Patient was prepped and draped in usual sterile fashion and imaging obtained to evaluate for foreign bodies Exploration:    Hemostasis achieved with:  Direct pressure   Wound exploration: wound explored through full range of motion       Contaminated: no   Treatment:    Amount of cleaning:  Extensive   Irrigation solution:  Sterile saline   Irrigation method:  Pressure wash Skin repair:    Repair method:  Sutures   Suture size:  4-0   Suture material:  Nylon   Suture technique:  Simple interrupted   Number of sutures:  7 Approximation:    Approximation:  Close Post-procedure details:    Dressing:  Sterile dressing and splint for protection   Patient tolerance of procedure:  Tolerated well, no immediate complications    Critical Care performed: No ____________________________________________   INITIAL IMPRESSION / ASSESSMENT AND PLAN / ED COURSE  Pertinent labs & imaging results that were available during my care of the patient were reviewed by me and considered in my medical decision making (see chart for details).  84 year old male with history of Parkinson's disease and frequent falls presents for swelling, erythema, and purulent drainage from an abrasion to the left hand that occurred from a fall 1 week ago, as well as laceration to the right hand which occurred during a fall today.  Patient denies any other acute injuries today.    I reviewed the past medical records in Epic and confirmed that the patient was seen on 06/13/2017 and noted to have an abrasion to the left hand but no other acute hand injury.  He also had CT head and x-ray of the ankle at that time which were negative.  On exam today, the vital signs are normal except for hypertension, the patient is relatively comfortable appearing, and there is significant swelling and erythema to the left hand and distal forearm, however there is full range of motion at the fingers and the hand is neuro/vascular intact.  There is no evidence of tenosynovitis, and no fluctuance or evidence of significant fluid collection.  The right hand reveals a laceration with approximately 1-2 mm of exposed tendon, but the tendon is intact, and patient has full range of motion  flexion extension of the fifth and fourth digits.  Plan: Infection/sepsis workup, IV antibiotics for cellulitis, laceration repair, x-rays of both hands, and admission for IV antibiotics.  No indication for ortho consultation based on my initial exam, given that there is no evidence of fluid collection or immediate indication for debridement.  Clinical Course as of Jun 21 17  Sat Jun 20, 2017  2206 DG Hand Complete Right [SS]    Clinical Course User Index [SS] Arta Silence, MD    ----------------------------------------- 12:18 AM on 06/21/2017 -----------------------------------------  Laceration successfully repaired.  I signed the patient out to the hospitalist Dr. Jannifer Franklin. ____________________________________________   FINAL CLINICAL IMPRESSION(S) / ED DIAGNOSES  Final diagnoses:  Cellulitis of left upper extremity  Laceration of right hand without foreign body, initial encounter      NEW MEDICATIONS STARTED DURING THIS VISIT:  New Prescriptions   No medications on file     Note:  This document was prepared using Dragon voice recognition software and may include unintentional dictation errors.    Arta Silence, MD 06/21/17 [phone]

## 2017-06-20 NOTE — ED Triage Notes (Signed)
Pt to ED with infection noted to left hand. Pt reportedly fell last week and the skin wound now has yellow discharge and redness noted on entire left hand and starting to progress up left forearm with swelling noted to left hand as well. Pt also had another fall today and has a noted 2 inch skin tear to the knuckle.   Falls are reported to be regular for pt and pt has hx of parkinson's.

## 2017-06-20 NOTE — ED Notes (Signed)
Left hand dressed

## 2017-06-21 ENCOUNTER — Emergency Department: Payer: Medicare Other

## 2017-06-21 DIAGNOSIS — L03114 Cellulitis of left upper limb: Secondary | ICD-10-CM | POA: Diagnosis present

## 2017-06-21 DIAGNOSIS — N400 Enlarged prostate without lower urinary tract symptoms: Secondary | ICD-10-CM | POA: Diagnosis not present

## 2017-06-21 DIAGNOSIS — I10 Essential (primary) hypertension: Secondary | ICD-10-CM | POA: Diagnosis not present

## 2017-06-21 DIAGNOSIS — E785 Hyperlipidemia, unspecified: Secondary | ICD-10-CM | POA: Diagnosis not present

## 2017-06-21 DIAGNOSIS — L03112 Cellulitis of left axilla: Secondary | ICD-10-CM | POA: Diagnosis not present

## 2017-06-21 LAB — CBC
HCT: 34.7 % — ABNORMAL LOW (ref 40.0–52.0)
Hemoglobin: 11.8 g/dL — ABNORMAL LOW (ref 13.0–18.0)
MCH: 31.6 pg (ref 26.0–34.0)
MCHC: 34 g/dL (ref 32.0–36.0)
MCV: 92.9 fL (ref 80.0–100.0)
PLATELETS: 236 10*3/uL (ref 150–440)
RBC: 3.73 MIL/uL — ABNORMAL LOW (ref 4.40–5.90)
RDW: 13.8 % (ref 11.5–14.5)
WBC: 10.4 10*3/uL (ref 3.8–10.6)

## 2017-06-21 LAB — BASIC METABOLIC PANEL
Anion gap: 10 (ref 5–15)
BUN: 30 mg/dL — AB (ref 6–20)
CALCIUM: 9 mg/dL (ref 8.9–10.3)
CO2: 23 mmol/L (ref 22–32)
CREATININE: 0.94 mg/dL (ref 0.61–1.24)
Chloride: 107 mmol/L (ref 101–111)
GFR calc non Af Amer: >60 mL/min (ref >60)
GLUCOSE: 106 mg/dL — AB (ref 65–99)
Potassium: 3.9 mmol/L (ref 3.5–5.1)
Sodium: 140 mmol/L (ref 135–145)

## 2017-06-21 LAB — MRSA PCR SCREENING: MRSA BY PCR: NEGATIVE

## 2017-06-21 MED ORDER — FINASTERIDE 5 MG PO TABS
5.0000 mg | ORAL_TABLET | Freq: Every evening | ORAL | Status: DC
  Administered 2017-06-21 – 2017-06-23 (×3): 5 mg via ORAL
  Filled 2017-06-21 (×3): qty 1

## 2017-06-21 MED ORDER — ONDANSETRON HCL 4 MG PO TABS: 4.0000 mg | ORAL_TABLET | Freq: Four times a day (QID) | ORAL | Status: DC | PRN

## 2017-06-21 MED ORDER — ACETAMINOPHEN 325 MG PO TABS
650.0000 mg | ORAL_TABLET | Freq: Four times a day (QID) | ORAL | Status: DC | PRN
  Administered 2017-06-22 – 2017-06-23 (×2): 650 mg via ORAL
  Filled 2017-06-21 (×2): qty 2

## 2017-06-21 MED ORDER — ACETAMINOPHEN 650 MG RE SUPP: 650.0000 mg | Freq: Four times a day (QID) | RECTAL | Status: DC | PRN

## 2017-06-21 MED ORDER — OXYCODONE HCL 5 MG PO TABS
5.0000 mg | ORAL_TABLET | ORAL | Status: DC | PRN
  Filled 2017-06-21: qty 1

## 2017-06-21 MED ORDER — ENOXAPARIN SODIUM 40 MG/0.4ML ~~LOC~~ SOLN
40.0000 mg | SUBCUTANEOUS | Status: DC
  Administered 2017-06-21 – 2017-06-22 (×2): 40 mg via SUBCUTANEOUS
  Filled 2017-06-21 (×2): qty 0.4

## 2017-06-21 MED ORDER — CEFAZOLIN SODIUM-DEXTROSE 1-4 GM/50ML-% IV SOLN: 1.0000 g | Freq: Three times a day (TID) | INTRAVENOUS | Status: DC

## 2017-06-21 MED ORDER — ATORVASTATIN CALCIUM 20 MG PO TABS
20.0000 mg | ORAL_TABLET | Freq: Every day | ORAL | Status: DC
  Administered 2017-06-21 – 2017-06-23 (×3): 20 mg via ORAL
  Filled 2017-06-21 (×3): qty 1

## 2017-06-21 MED ORDER — ONDANSETRON HCL 4 MG/2ML IJ SOLN: 4.0000 mg | Freq: Four times a day (QID) | INTRAMUSCULAR | Status: DC | PRN

## 2017-06-21 MED ORDER — DEXTROSE 5 % IV SOLN
1000.0000 mg | Freq: Three times a day (TID) | INTRAVENOUS | Status: DC
  Administered 2017-06-21 – 2017-06-22 (×3): 1000 mg via INTRAVENOUS
  Filled 2017-06-21 (×6): qty 10

## 2017-06-21 MED ORDER — METOPROLOL SUCCINATE ER 25 MG PO TB24
25.0000 mg | ORAL_TABLET | Freq: Every day | ORAL | Status: DC
  Administered 2017-06-21 – 2017-06-23 (×3): 25 mg via ORAL
  Filled 2017-06-21 (×3): qty 1

## 2017-06-21 MED ORDER — IRBESARTAN 150 MG PO TABS
150.0000 mg | ORAL_TABLET | Freq: Every day | ORAL | Status: DC
  Administered 2017-06-21 – 2017-06-23 (×3): 150 mg via ORAL
  Filled 2017-06-21 (×3): qty 1

## 2017-06-21 MED ORDER — ASPIRIN EC 81 MG PO TBEC
81.0000 mg | DELAYED_RELEASE_TABLET | Freq: Every day | ORAL | Status: DC
  Administered 2017-06-21 – 2017-06-23 (×3): 81 mg via ORAL
  Filled 2017-06-21 (×3): qty 1

## 2017-06-21 MED ORDER — AMLODIPINE BESYLATE 5 MG PO TABS
10.0000 mg | ORAL_TABLET | Freq: Every day | ORAL | Status: DC
  Administered 2017-06-21 – 2017-06-23 (×3): 10 mg via ORAL
  Filled 2017-06-21 (×3): qty 2

## 2017-06-21 MED ORDER — TAMSULOSIN HCL 0.4 MG PO CAPS
0.4000 mg | ORAL_CAPSULE | Freq: Every day | ORAL | Status: DC
  Administered 2017-06-21 – 2017-06-23 (×3): 0.4 mg via ORAL
  Filled 2017-06-21 (×3): qty 1

## 2017-06-21 MED ORDER — CARBIDOPA-LEVODOPA 25-100 MG PO TABS
0.5000 | ORAL_TABLET | Freq: Three times a day (TID) | ORAL | Status: DC
  Administered 2017-06-21 – 2017-06-23 (×8): 0.5 via ORAL
  Filled 2017-06-21 (×10): qty 0.5

## 2017-06-21 NOTE — Progress Notes (Signed)
[place] [facility] [place] NAME: Carlton Buskey    MR#:  [id number]  DATE OF BIRTH:  Mar 25, 1929  SUBJECTIVE:  CHIEF COMPLAINT:   Chief Complaint  Patient presents with  . Wound Infection    REVIEW OF SYSTEMS:  Review of Systems  Constitutional: Positive for malaise/fatigue. Negative for chills and fever.  HENT: Negative for congestion, ear discharge, hearing loss and nosebleeds.   Eyes: Negative for blurred vision and double vision.  Respiratory: Negative for cough, shortness of breath and wheezing.   Cardiovascular: Negative for chest pain, palpitations and leg swelling.  Gastrointestinal: Negative for abdominal pain, constipation, diarrhea, nausea and vomiting.  Genitourinary: Negative for dysuria.  Musculoskeletal: Positive for falls, joint pain and myalgias.  Neurological: Negative for dizziness, speech change, focal weakness, seizures and headaches.  Psychiatric/Behavioral: Negative for depression.       Some confusion    DRUG ALLERGIES:  No Known Allergies  VITALS:  Blood pressure (!) 166/61, pulse 91, temperature 99.6 F (37.6 C), temperature source Axillary, resp. rate 16, height 5\' 7"  (1.702 m), weight 67.8 kg (149 lb 8 oz), SpO2 99 %.  PHYSICAL EXAMINATION:  Physical Exam  GENERAL:  84 y.o.-year-old elderly patient lying in the bed with no acute distress.  EYES: Pupils equal, round, reactive to light and accommodation. No scleral icterus. Extraocular muscles intact.  HEENT: Head atraumatic, normocephalic. Oropharynx and nasopharynx clear.  NECK:  Supple, no jugular venous distention. No thyroid enlargement, no tenderness.  LUNGS: Normal breath sounds bilaterally, no wheezing, rales,rhonchi or crepitation. No use of accessory muscles of respiration. Decreased bibasilar breath sounds CARDIOVASCULAR: S1, S2 normal. No rubs, or gallops. 2/6 systolic murmur is present ABDOMEN: Soft, nontender, nondistended. Bowel sounds  present. No organomegaly or mass.  EXTREMITIES: No pedal edema, cyanosis, or clubbing.  Left hand dorsum is swollen extending onto the distal forearm. Red and tender to touch NEUROLOGIC: Cranial nerves II through XII are intact. Muscle strength 5/5 in all extremities. Sensation intact. Gait not checked.  PSYCHIATRIC: The patient is alert and oriented x 3. Mild cognitive deficit noted SKIN: No obvious rash, lesion, or ulcer.    LABORATORY PANEL:   CBC Recent Labs  Lab 06/21/17 0347  WBC 10.4  HGB 11.8*  HCT 34.7*  PLT 236   ------------------------------------------------------------------------------------------------------------------  Chemistries  Recent Labs  Lab 06/20/17 1729 06/21/17 0347  NA 140 140  K 4.3 3.9  CL 106 107  CO2 22 23  GLUCOSE 113* 106*  BUN 38* 30*  CREATININE 1.02 0.94  CALCIUM 9.3 9.0  AST 21  --   ALT <5*  --   ALKPHOS 59  --   BILITOT 1.0  --    ------------------------------------------------------------------------------------------------------------------  Cardiac Enzymes No results for input(s): TROPONINI in the last 168 hours. ------------------------------------------------------------------------------------------------------------------  RADIOLOGY:  Dg Hand 2 View Left  Result Date: 06/21/2017 CLINICAL DATA:  Hand cellulitis EXAM: LEFT HAND - 2 VIEW COMPARISON:  None. FINDINGS: Study is limited by positioning, there is flexed positioning of the digits with overlap of bony structures. No gross fracture is seen. No definitive periostitis or bone destruction. Diffuse soft tissue swelling without soft tissue gas. IMPRESSION: Limited secondary to positioning. No definite acute osseous abnormality. Electronically Signed   By: Donavan Foil M.D.   On: 06/21/2017 00:11   Dg Hand Complete Right  Result Date: 06/20/2017 CLINICAL DATA:  Pain following fall EXAM: RIGHT HAND - COMPLETE 3+ VIEW COMPARISON:  None. FINDINGS: Frontal, oblique, and  lateral views were obtained. There is a bandage medially. No other radiopaque foreign body. There is no appreciable fracture or dislocation. There is osteoarthritic change in the first, second, and third MCP joints as well as in the first IP joint. There is also osteoarthritic change in the fifth PIP and all DIP joints. No erosive change. 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 No fracture or dislocation. Bandage medially. Electronically Signed   By: Lowella Grip III M.D.   On: 06/20/2017 19:52    EKG:   Orders placed or performed during the hospital encounter of 06/13/17  . ED EKG  . ED EKG  . EKG 12-Lead  . EKG 12-Lead  . EKG 12-Lead  . EKG 12-Lead    ASSESSMENT AND PLAN:   84 year old male with past medical history significant for Parkinson's disease, orthostatic hypertension, neuropathy, hypertension presents to hospital secondary to fall and left hand swelling.  1. Left hand cellulitis-triggered from fall and laceration. -Still pretty swollen and tender. Keep the hand elevated. -Continue IV antibiotics -Pain medications.  2. Hypertension-continue home medications. Patient on Toprol, Avapro and Norvasc. -Known history of orthostatic hypotension from Parkinson's disease.  3. Falls- Partly from orthostatic hypotension and also gait instability from Parkinson's. -Physical therapy consulted -Check orthostatics when working with physical therapy  4. Parkinson's disease-continue Sinemet  5. BPH-on Flomax and Proscar  6. DVT prophylaxis-on Lovenox  Physical therapy consulted. Daughter updated at bedside    All the records are reviewed and case discussed with Care Management/Social Workerr. Management plans discussed with the patient, family and they are in agreement.  CODE STATUS: Full code  TOTAL TIME TAKING CARE OF THIS PATIENT: 38 minutes.   POSSIBLE D/C IN 1-2 DAYS, DEPENDING ON CLINICAL CONDITION.   Rozanne Heumann M.D on 06/21/2017 at  12:13 PM  Between 7am to 6pm - Pager - [phone]  After 6pm go to [email or web address] - Proofreader  [facility]  Office  [phone]  CC: Primary care physician; [facility]

## 2017-06-21 NOTE — Care Management Obs Status (Signed)
[place] NOTIFICATION   Patient Details  Name: RAVI TUCCILLO MRN: [id number] Date of Birth: [birth year]   Medicare Observation Status Notification Given:  Yes    Carmelia Tiner A, RN 06/21/2017, 3:43 PM

## 2017-06-21 NOTE — H&P (Signed)
[facility] NAME: Ivan Clark    MR#:  [id number]  DATE OF BIRTH:  08-Feb-1929  DATE OF ADMISSION:  06/20/2017  PRIMARY CARE PHYSICIAN: [facility]   REQUESTING/REFERRING PHYSICIAN: Cherylann Banas, MD  CHIEF COMPLAINT:   Chief Complaint  Patient presents with  . Wound Infection    HISTORY OF PRESENT ILLNESS:  Ivan Clark  is a 84 y.o. male who presents with fall at home with skin tear to his left hand and subsequent erythema and warmth and swelling tracking up his arm.  He had another fall at home today and sustained a laceration to his right hand, this was repaired by ED physician.  Hospitalist were called for antibiotic treatment of the cellulitis of his left hand.  PAST MEDICAL HISTORY:   Past Medical History:  Diagnosis Date  . Aneurysm of infrarenal abdominal aorta (HCC) SUPRAILIAC  4CM PER CT 10-20-2010--  STABLE  . Bladder cancer ([place]) RECURRENT  (FIRST DX  2001)   HX TURBT AND CHEMO BLADDER INSTILLATION TX'S  . Frequency of urination   . Hyperlipidemia   . Hypertension   . Nocturia   . Orthostatic hypotension   . Parkinson disease ([place])   . Peripheral neuropathy     PAST SURGICAL HISTORY:   Past Surgical History:  Procedure Laterality Date  . APPENDECTOMY  10-20-2010  . CYSTO/ BLADDER BX/ FULGERATION BLADDER TUMOR  10-09-2004  . CYSTO/ RETROGRADE PYELOGRAM/ BLADDER BX'S  07-03-2004  . CYSTOSCOPY WITH BIOPSY  11/28/2011   Procedure: CYSTOSCOPY WITH BIOPSY;  Surgeon: Fredricka Bonine, MD;  Location: [facility];  Service: Urology;  Laterality: N/A;  . HIP ARTHROPLASTY Right 04/09/2013   Procedure: ARTHROPLASTY BIPOLAR HIP;  Surgeon: Augustin Schooling, MD;  Location: [facility];  Service: Orthopedics;  Laterality: Right;  . LEFT INGUINAL HERNIA REPAIR  1996  . TONSILLECTOMY AND ADENOIDECTOMY  1938  . TRANSURETHRAL RESECTION OF BLADDER TUMOR  09-11-1999   BLADDER CANCER   . VASECTOMY      SOCIAL HISTORY:   Social History   Tobacco Use  . Smoking status: Former Smoker    Packs/day: 2.00    Years: 50.00    Pack years: 100.00    Types: Cigarettes    Last attempt to quit: 05/13/1999    Years since quitting: 18.1  . Smokeless tobacco: Never Used  Substance Use Topics  . Alcohol use: No    Alcohol/week: 0.0 oz    FAMILY HISTORY:   Family History  Problem Relation Age of Onset  . Alzheimer's disease Mother   . Heart failure Father   . Kidney cancer Neg Hx   . Prostate cancer Neg Hx     DRUG ALLERGIES:  No Known Allergies  MEDICATIONS AT HOME:   Prior to Admission medications   Medication Sig Start Date End Date Taking? Authorizing Provider  acetaminophen (TYLENOL) 500 MG tablet Take 1,000 mg by mouth 3 (three) times daily.    Yes [provider]  amLODipine (NORVASC) 10 MG tablet Take 10 mg by mouth daily.  02/26/17  Yes [provider]  aspirin EC 81 MG tablet Take 1 tablet (81 mg total) by mouth daily after supper. 04/22/13  Yes Angiulli, Lavon Paganini, PA-C  atorvastatin (LIPITOR) 20 MG tablet Take 1 tablet (20 mg total) by mouth daily. 04/22/13  Yes Angiulli, Lavon Paganini, PA-C  carbidopa-levodopa (SINEMET IR) 25-100 MG tablet 1.5 tablets at 8, 12, 6:30pm Patient taking  differently: Take 0.5 tablets by mouth 3 (three) times daily.  03/09/17  Yes Dennie Bible, NP  cholecalciferol (VITAMIN D) 1000 units tablet Take 2,000 Units by mouth daily.    Yes [provider]  finasteride (PROSCAR) 5 MG tablet Take 5 mg by mouth every evening.  05/22/14  Yes [provider]  fluticasone (FLONASE) 50 MCG/ACT nasal spray Place 1 spray into both nostrils 2 (two) times daily as needed (runny nose/ sneezing).  06/02/16  Yes [provider]  irbesartan (AVAPRO) 150 MG tablet Take 1 tablet (150 mg total) by mouth 2 (two) times daily. Patient taking differently: Take 150 mg by mouth daily.  04/22/13  Yes Angiulli,  Lavon Paganini, PA-C  Lidocaine (ASPERCREME LIDOCAINE) 4 % PTCH Apply 1 patch topically daily at 12 noon. Apply patch at 6am and remove at 6pm   Yes [provider]  metoprolol succinate (TOPROL-XL) 25 MG 24 hr tablet Take 25 mg by mouth daily.  02/23/17  Yes [provider]  miconazole (MICOTIN) 2 % cream Apply 1 application topically daily at 12 noon. Apply to groin and scrotum 05/25/17 06/25/17 Yes [provider]  naproxen sodium (ANAPROX) 220 MG tablet Take 220 mg by mouth daily at 12 noon.    Yes [provider]  nystatin (MYCOSTATIN/NYSTOP) powder Apply 2 g topically at bedtime.  05/25/17 06/25/17 Yes [provider]  nystatin (NYSTATIN) powder Apply 2 g topically 2 (two) times daily as needed (redness/ burning of scrotum).   Yes [provider]  Polyethyl Glycol-Propyl Glycol (SYSTANE) 0.4-0.3 % SOLN Apply 1 application to eye 4 (four) times daily as needed (dry eyes).    Yes [provider]  rasagiline (AZILECT) 1 MG TABS tablet Take 1 tablet (1 mg total) by mouth daily. 03/09/17  Yes Dennie Bible, NP  sennosides-docusate sodium (SENOKOT-S) 8.6-50 MG tablet Take 1 tablet by mouth every evening.    Yes [provider]  tamsulosin (FLOMAX) 0.4 MG CAPS capsule Take 1 capsule (0.4 mg total) by mouth daily after supper. 04/22/13  Yes Angiulli, Lavon Paganini, PA-C    REVIEW OF SYSTEMS:  Review of Systems  Constitutional: Negative for chills, fever, malaise/fatigue and weight loss.  HENT: Negative for ear pain, hearing loss and tinnitus.   Eyes: Negative for blurred vision, double vision, pain and redness.  Respiratory: Negative for cough, hemoptysis and shortness of breath.   Cardiovascular: Negative for chest pain, palpitations, orthopnea and leg swelling.  Gastrointestinal: Negative for abdominal pain, constipation, diarrhea, nausea and vomiting.  Genitourinary: Negative for dysuria, frequency and hematuria.   Musculoskeletal: Negative for back pain, joint pain and neck pain.  Skin:       Erythema swelling of his left hand  Neurological: Negative for dizziness, tremors, focal weakness and weakness.  Endo/Heme/Allergies: Negative for polydipsia. Does not bruise/bleed easily.  Psychiatric/Behavioral: Negative for depression. The patient is not nervous/anxious and does not have insomnia.      VITAL SIGNS:   Vitals:   06/20/17 1728 06/20/17 1729 06/20/17 2311  BP: (!) 165/90  (!) 186/75  Pulse: 80  76  Resp: 16  16  Temp: (!) 97.3 F (36.3 C)    TempSrc: Oral    SpO2: 98%  99%  Weight:  72.6 kg (160 lb)   Height:  5\' 7"  (1.702 m)    Wt Readings from Last 3 Encounters:  06/20/17 72.6 kg (160 lb)  06/13/17 72.6 kg (160 lb)  04/08/17 72.3 kg (159 lb 7  oz)    PHYSICAL EXAMINATION:  Physical Exam  Vitals reviewed. Constitutional: He is oriented to person, place, and time. He appears well-developed and well-nourished. No distress.  HENT:  Head: Normocephalic and atraumatic.  Mouth/Throat: Oropharynx is clear and moist.  Eyes: Conjunctivae and EOM are normal. Pupils are equal, round, and reactive to light. No scleral icterus.  Neck: Normal range of motion. Neck supple. No JVD present. No thyromegaly present.  Cardiovascular: Normal rate, regular rhythm and intact distal pulses. Exam reveals no gallop and no friction rub.  No murmur heard. Respiratory: Effort normal and breath sounds normal. No respiratory distress. He has no wheezes. He has no rales.  GI: Soft. Bowel sounds are normal. He exhibits no distension. There is no tenderness.  Musculoskeletal: Normal range of motion. He exhibits no edema.  No arthritis, no gout  Lymphadenopathy:    He has no cervical adenopathy.  Neurological: He is alert and oriented to person, place, and time. No cranial nerve deficit.  No dysarthria, no aphasia  Skin: Skin is warm and dry. No rash noted. There is erythema (With swelling and tenderness of  his left hand).  Bandage over right hand covering repaired laceration  Psychiatric: He has a normal mood and affect. His behavior is normal. Judgment and thought content normal.    LABORATORY PANEL:   CBC Recent Labs  Lab 06/20/17 1729  WBC 11.5*  HGB 12.3*  HCT 36.8*  PLT 255   ------------------------------------------------------------------------------------------------------------------  Chemistries  Recent Labs  Lab 06/20/17 1729  NA 140  K 4.3  CL 106  CO2 22  GLUCOSE 113*  BUN 38*  CREATININE 1.02  CALCIUM 9.3  AST 21  ALT <5*  ALKPHOS 59  BILITOT 1.0   ------------------------------------------------------------------------------------------------------------------  Cardiac Enzymes No results for input(s): TROPONINI in the last 168 hours. ------------------------------------------------------------------------------------------------------------------  RADIOLOGY:  Dg Hand 2 View Left  Result Date: 06/21/2017 CLINICAL DATA:  Hand cellulitis EXAM: LEFT HAND - 2 VIEW COMPARISON:  None. FINDINGS: Study is limited by positioning, there is flexed positioning of the digits with overlap of bony structures. No gross fracture is seen. No definitive periostitis or bone destruction. Diffuse soft tissue swelling without soft tissue gas. IMPRESSION: Limited secondary to positioning. No definite acute osseous abnormality. Electronically Signed   By: Donavan Foil M.D.   On: 06/21/2017 00:11   Dg Hand Complete Right  Result Date: 06/20/2017 CLINICAL DATA:  Pain following fall EXAM: RIGHT HAND - COMPLETE 3+ VIEW COMPARISON:  None. FINDINGS: Frontal, oblique, and lateral views were obtained. There is a bandage medially. No other radiopaque foreign body. There is no appreciable fracture or dislocation. There is osteoarthritic change in the first, second, and third MCP joints as well as in the first IP joint. There is also osteoarthritic change in the fifth PIP and all DIP joints.  No erosive change. 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 No fracture or dislocation. Bandage medially. Electronically Signed   By: Lowella Grip III M.D.   On: 06/20/2017 19:52    EKG:   Orders placed or performed during the hospital encounter of 06/13/17  . ED EKG  . ED EKG  . EKG 12-Lead  . EKG 12-Lead  . EKG 12-Lead  . EKG 12-Lead    IMPRESSION AND PLAN:  Principal Problem:   Cellulitis of left upper extremity -IV antibiotics, wound care Active Problems:   Essential hypertension -continue home meds   Hyperlipidemia -continue home medications   Benign prostatic hyperplasia -home dose medications  All the records are reviewed and case discussed with ED provider. Management plans discussed with the patient and/or family.  DVT PROPHYLAXIS: SubQ lovenox  GI PROPHYLAXIS: None  ADMISSION STATUS: Observation  CODE STATUS: Full Code Status History    Date Active Date Inactive Code Status Order ID Comments User Context   04/14/2013 16:06 04/23/2013 12:05 Full Code [id number]  Elizabeth Sauer Inpatient   04/09/2013 17:46 04/14/2013 16:06 Full Code [id number]  Mendel Corning, MD Inpatient   03/01/2013 02:37 03/03/2013 22:42 Full Code [id number]  Toy Baker, MD Inpatient      TOTAL TIME TAKING CARE OF THIS PATIENT: 40 minutes.   Ivan Clark 06/21/2017, 12:52 AM  [employer]  [phone]  CC: Primary care physician; [facility]  Note:  This document was prepared using Dragon voice recognition software and may include unintentional dictation errors.

## 2017-06-21 NOTE — Progress Notes (Signed)
Pt's SBP has been elevated this AM. Prior to his at home BP meds it was 184, recheck after at home BP is 166. He denies CP, SOB, and lightheadedness. Dr. Tressia Miners notified during rounds.

## 2017-06-21 NOTE — ED Notes (Signed)
Right hand bleeding controlled post sutures, cleaned and dressed, splint applied to right pinky

## 2017-06-22 LAB — BASIC METABOLIC PANEL
Anion gap: 10 (ref 5–15)
BUN: 24 mg/dL — AB (ref 6–20)
CHLORIDE: 105 mmol/L (ref 101–111)
CO2: 24 mmol/L (ref 22–32)
CREATININE: 0.9 mg/dL (ref 0.61–1.24)
Calcium: 8.8 mg/dL — ABNORMAL LOW (ref 8.9–10.3)
GFR calc Af Amer: >60 mL/min (ref >60)
GFR calc non Af Amer: >60 mL/min (ref >60)
Glucose, Bld: 100 mg/dL — ABNORMAL HIGH (ref 65–99)
Potassium: 3.7 mmol/L (ref 3.5–5.1)
SODIUM: 139 mmol/L (ref 135–145)

## 2017-06-22 MED ORDER — RASAGILINE MESYLATE 1 MG PO TABS
1.0000 mg | ORAL_TABLET | Freq: Every day | ORAL | Status: DC
  Administered 2017-06-22 – 2017-06-23 (×2): 1 mg via ORAL
  Filled 2017-06-22 (×2): qty 1

## 2017-06-22 MED ORDER — COLLAGENASE 250 UNIT/GM EX OINT
TOPICAL_OINTMENT | Freq: Every day | CUTANEOUS | Status: DC
  Administered 2017-06-22 – 2017-06-23 (×2): via TOPICAL
  Filled 2017-06-22: qty 30

## 2017-06-22 MED ORDER — SENNOSIDES-DOCUSATE SODIUM 8.6-50 MG PO TABS
1.0000 | ORAL_TABLET | Freq: Two times a day (BID) | ORAL | Status: DC
  Administered 2017-06-22 – 2017-06-23 (×2): 1 via ORAL
  Filled 2017-06-22 (×3): qty 1

## 2017-06-22 MED ORDER — CEFAZOLIN SODIUM-DEXTROSE 1-4 GM/50ML-% IV SOLN
1.0000 g | Freq: Three times a day (TID) | INTRAVENOUS | Status: DC
  Administered 2017-06-22 – 2017-06-23 (×4): 1 g via INTRAVENOUS
  Filled 2017-06-22 (×7): qty 50

## 2017-06-22 NOTE — Evaluation (Signed)
Physical Therapy Evaluation Patient Details Name: Ivan Clark MRN: [id number] DOB: June 11, 1928 Today's Date: 06/22/2017   History of Present Illness  Pt is an 84 y.o. male presenting to hospital s/p fall with R hand injury s/p laceration repair in ED (R 5th finger placed in splint); pt s/p another recent fall 06/13/17 with mild L ankle pain, abrasion L hand and top of scalp.  Pt now presenting with L hand and distal arm swelling, redness, yellow drainage (cellulitis L UE).  PMH includes Parkinson's, frequent falls, htn, bladder CA, aneurysm of infrarenal abdominal aorta, htn, orthostatic hypotension, peripheral neuropathy, frequent urination, R THA.  Clinical Impression  Prior to hospital admission, pt was ambulatory with 4ww.  Pt lives at [facility].  Currently pt is 1-2 assist with bed mobility and max assist x1 to stand (significant posterior lean noted in standing requiring assist and vc's to improve posture control).  Unable to safely transfer pt to chair d/t significant posterior lean.  Decreased L ankle DF ROM noted (plantarflexed position).  R fifth finger splint noted; nursing notified; no fx noted in chart review; Per ED physician note "The right hand reveals a laceration with approximately 1-2 mm of exposed tendon, but the tendon is intact, and patient has full range of motion flexion extension of the fifth and fourth digits".  No significant decrease in systolic orthostatic BP vitals noted (see below for vitals during session).  Pt would benefit from skilled PT to address noted impairments and functional limitations (see below for any additional details).  Upon hospital discharge, recommend pt discharge to [place].    Follow Up Recommendations SNF    Equipment Recommendations  Rolling walker with 5" wheels    Recommendations for Other Services OT consult     Precautions / Restrictions Precautions Precautions: Fall Precaution Comments: L hand wound (keep elevated); R 5th finger in  splint Restrictions Weight Bearing Restrictions: No Other Position/Activity Restrictions: No WB'ing restrictions noted in chart      Mobility  Bed Mobility Overal bed mobility: Needs Assistance Bed Mobility: Supine to Sit;Sit to Supine     Supine to sit: Max assist;HOB elevated Sit to supine: Mod assist;+2 for physical assistance;HOB elevated   General bed mobility comments: assist for LE's and trunk supine to/from sit; 2 assist to boost up in bed  Transfers Overall transfer level: Needs assistance Equipment used: None Transfers: Sit to/from Stand Sit to Stand: Max assist         General transfer comment: assist to initiate stand and come to full upright posture; significant posterior lean noted in standing  Ambulation/Gait             General Gait Details: Deferred d/t significant assist required to maintain standing balance >2 minutes d/t posterior lean  Stairs            Wheelchair Mobility    Modified Rankin (Stroke Patients Only)       Balance Overall balance assessment: Needs assistance;History of Falls Sitting-balance support: Bilateral upper extremity supported;Feet supported Sitting balance-Leahy Scale: Poor Sitting balance - Comments: varying between min assist to close SBA d/t intermittent posterior lean   Standing balance support: Bilateral upper extremity supported(hands on therapist for support) Standing balance-Leahy Scale: Zero Standing balance comment: significant posterior lean in standing; vc's given for upright posture and to shift hips forward but requiring maximal assist to maintain balance d/t posterior lean  Pertinent Vitals/Pain Pain Assessment: No/denies pain  BP supine 159/71 (HR 77 bpm), sitting 154/66 (HR 79 bpm), and standing 149/117 (question accuracy of last BP standing d/t pt moving R UE; HR 117 bpm).  O2 96% or greater on room air during session.    Home Living Family/patient  expects to be discharged to:: Assisted living               Home Equipment: Gilford Rile - 2 wheels;Walker - 4 wheels;Wheelchair - manual Additional Comments: Has lived at [facility] since January 2017    Prior Function Level of Independence: Needs assistance   Gait / Transfers Assistance Needed: Modified independent with bed mobility, transfers, and ambulation with 4ww.  Averages 6 falls per year but increased falls last 2 months.  ADL's / Homemaking Assistance Needed: Assist with bathing, cleaning, meals, medications, dressing.        Hand Dominance        Extremity/Trunk Assessment   Upper Extremity Assessment Upper Extremity Assessment: RUE deficits/detail;LUE deficits/detail RUE Deficits / Details: good R hand grip with 1st-4th digits (R 5th digit in splint) LUE Deficits / Details: decreased L hand grip strength and decreased extension of 2nd-5th fingers    Lower Extremity Assessment Lower Extremity Assessment: LLE deficits/detail;RLE deficits/detail RLE Deficits / Details: R DF to almost neutral;  rigid LE movement; at least 3-/5 hip flexion, knee flexion/extension LLE Deficits / Details: L DF grossly 20 degrees short of neutral; rigid LE movement; at least 3-/5 hip flexion, knee flexion/extension    Cervical / Trunk Assessment Cervical / Trunk Assessment: Other exceptions;Kyphotic Cervical / Trunk Exceptions: significant forward head  Communication   Communication: No difficulties  Cognition Arousal/Alertness: Awake/alert Behavior During Therapy: WFL for tasks assessed/performed Overall Cognitive Status: Within Functional Limits for tasks assessed                                        General Comments General comments (skin integrity, edema, etc.): Dressings noted dorsal surface B hands; R 5th finger splint in place.  Nursing cleared pt for participation in physical therapy.  Pt agreeable to PT session.  Pt's daughter present during session and  another daughter came during session.    Exercises     Assessment/Plan    PT Assessment Patient needs continued PT services  PT Problem List Decreased strength;Decreased range of motion;Decreased activity tolerance;Decreased balance;Decreased mobility;Decreased knowledge of use of DME;Decreased knowledge of precautions;Decreased skin integrity;Decreased safety awareness       PT Treatment Interventions DME instruction;Gait training;Functional mobility training;Therapeutic activities;Therapeutic exercise;Balance training;Patient/family education;Neuromuscular re-education    PT Goals (Current goals can be found in the Care Plan section)  Acute Rehab PT Goals Patient Stated Goal: to be able to walk again PT Goal Formulation: With patient/family Time For Goal Achievement: 07/06/17 Potential to Achieve Goals: Good    Frequency Min 2X/week   Barriers to discharge Decreased caregiver support      Co-evaluation               AM-PAC PT "6 Clicks" Daily Activity  Outcome Measure Difficulty turning over in bed (including adjusting bedclothes, sheets and blankets)?: Unable Difficulty moving from lying on back to sitting on the side of the bed? : Unable Difficulty sitting down on and standing up from a chair with arms (e.g., wheelchair, bedside commode, etc,.)?: Unable Help needed moving to and from a bed to chair (  including a wheelchair)?: Total Help needed walking in hospital room?: Total Help needed climbing 3-5 steps with a railing? : Total 6 Click Score: 6    End of Session Equipment Utilized During Treatment: Gait belt Activity Tolerance: Patient limited by fatigue Patient left: in bed;with call bell/phone within reach;with bed alarm set;with family/visitor present(B heels elevated via pillow) Nurse Communication: Mobility status;Precautions PT Visit Diagnosis: Other abnormalities of gait and mobility (R26.89);Repeated falls (R29.6);Muscle weakness (generalized)  (M62.81);History of falling (Z91.81);Difficulty in walking, not elsewhere classified (R26.2)    Time: 1216-1300 PT Time Calculation (min) (ACUTE ONLY): 44 min   Charges:   PT Evaluation $PT Eval Low Complexity: 1 Low PT Treatments $Therapeutic Activity: 23-37 mins   PT G CodesLeitha Bleak, PT 06/22/17, 2:08 PM [phone]

## 2017-06-22 NOTE — [place] FL2 (Signed)
[place] LEVEL OF CARE SCREENING TOOL     IDENTIFICATION  Patient Name: Ivan Clark Birthdate: Sep 06, 1928 Sex: male Admission Date (Current Location): 06/20/2017  [place] and [place] Number:  Engineering geologist and Address:  [facility], [address] [place], [place] [address]      Provider Number: [id number]  Attending Physician Name and Address:  Gladstone Lighter, MD  Relative Name and Phone Number:       Current Level of Care: Hospital Recommended Level of Care: [place] Prior Approval Number:    Date Approved/Denied:   PASRR Number: ([id number] )  Discharge Plan: SNF    Current Diagnoses: Patient Active Problem List   Diagnosis Date Noted  . Cellulitis of left upper extremity 06/21/2017  . Cellulitis of left hand 06/21/2017  . Fall 09/10/2016  . Frequent falls 10/17/2015  . Fracture of femoral neck, right ([place]) 04/14/2013  . Right femoral fracture ([place]) 04/09/2013  . Hyperlipidemia 04/09/2013  . Benign prostatic hyperplasia 04/09/2013  . Closed right hip fracture ([place]) 04/09/2013  . Hip fracture, right ([place]) 04/09/2013  . Femoral neck fracture ([place]) 04/09/2013  . Dysphagia, pharyngeal 03/02/2013  . Cervical dystonia 07/28/2012  . Sialorrhea 07/28/2012  . Personal history of malignant neoplasm of bladder 07/27/2012  . Essential hypertension 07/27/2012  . Dizziness and giddiness 07/27/2012  . Parkinsonism ([place]) 07/27/2012    Orientation RESPIRATION BLADDER Height & Weight     Self, Time, Place  Normal Incontinent Weight: 149 lb 8 oz (67.8 kg) Height:  5\' 7"  (170.2 cm)  BEHAVIORAL SYMPTOMS/MOOD NEUROLOGICAL BOWEL NUTRITION STATUS      Continent Diet(Diet: Heart Healthy )  AMBULATORY STATUS COMMUNICATION OF NEEDS Skin   Extensive Assist Verbally Other (Comment)(bilateral injury to dorsal hands from recent falls, 1 week apart. Suture line to right dorsal hand near fifth finger. This finger  is also splinted and wrapped with kerlix Digit. Noted to be bruised)                       Personal Care Assistance Level of Assistance  Bathing, Feeding, Dressing Bathing Assistance: Limited assistance Feeding assistance: Independent Dressing Assistance: Limited assistance     Functional Limitations Info  Sight, Hearing, Speech Sight Info: Adequate Hearing Info: Adequate Speech Info: Adequate    SPECIAL CARE FACTORS FREQUENCY  PT (By licensed PT), OT (By licensed OT)     PT Frequency: (5) OT Frequency: (5)            Contractures      Additional Factors Info  Code Status, Allergies Code Status Info: (Full Code. ) Allergies Info: (No Known Allergies. )           Current Medications (06/22/2017):  This is the current hospital active medication list Current Facility-Administered Medications  Medication Dose Route Frequency Provider Last Rate Last Dose  . acetaminophen (TYLENOL) tablet 650 mg  650 mg Oral Q6H PRN Lance Coon, MD   650 mg at 06/22/17 1203   Or  . acetaminophen (TYLENOL) suppository 650 mg  650 mg Rectal Q6H PRN Lance Coon, MD      . amLODipine (NORVASC) tablet 10 mg  10 mg Oral Daily Lance Coon, MD   10 mg at 06/22/17 0913  . aspirin EC tablet 81 mg  81 mg Oral QPC supper Lance Coon, MD   81 mg at 06/21/17 1708  . atorvastatin (LIPITOR) tablet 20 mg  20 mg Oral Daily Willis,  Shanon Brow, MD   20 mg at 06/22/17 0913  . carbidopa-levodopa (SINEMET IR) 25-100 MG per tablet immediate release 0.5 tablet  0.5 tablet Oral TID Lance Coon, MD   0.5 tablet at 06/22/17 0914  . ceFAZolin (ANCEF) IVPB 1 g/50 mL premix  1 g Intravenous Q8H Gladstone Lighter, MD   Stopped at 06/22/17 1453  . collagenase (SANTYL) ointment   Topical Daily Gladstone Lighter, MD      . enoxaparin (LOVENOX) injection 40 mg  40 mg Subcutaneous Q24H Lance Coon, MD   40 mg at 06/21/17 2231  . finasteride (PROSCAR) tablet 5 mg  5 mg Oral QPM Lance Coon, MD   5 mg at  06/21/17 1708  . irbesartan (AVAPRO) tablet 150 mg  150 mg Oral Daily Lance Coon, MD   150 mg at 06/22/17 0913  . metoprolol succinate (TOPROL-XL) 24 hr tablet 25 mg  25 mg Oral Daily Lance Coon, MD   25 mg at 06/22/17 0913  . ondansetron (ZOFRAN) tablet 4 mg  4 mg Oral Q6H PRN Lance Coon, MD       Or  . ondansetron [facility]) injection 4 mg  4 mg Intravenous Q6H PRN Lance Coon, MD      . oxyCODONE (Oxy IR/ROXICODONE) immediate release tablet 5 mg  5 mg Oral Q4H PRN Lance Coon, MD      . rasagiline (AZILECT) tablet 1 mg  1 mg Oral Daily Gladstone Lighter, MD      . senna-docusate (Senokot-S) tablet 1 tablet  1 tablet Oral BID Gladstone Lighter, MD      . tamsulosin [facility]) capsule 0.4 mg  0.4 mg Oral QPC supper Lance Coon, MD   0.4 mg at 06/21/17 1708   Facility-Administered Medications Ordered in Other Encounters  Medication Dose Route Frequency Provider Last Rate Last Dose  . mitomycin (MUTAMYCIN) chemo injection 40 mg  40 mg Bladder Instillation Once Festus Aloe, MD         Discharge Medications: Please see discharge summary for a list of discharge medications.  Relevant Imaging Results:  Relevant Lab Results:   Additional Information (SSN: [id number])  Dodge Ator, Veronia Beets, LCSW

## 2017-06-22 NOTE — Progress Notes (Signed)
[facility] [place] NAME: Ivan Clark    MR#:  [id number]  DATE OF BIRTH:  28-Jan-1929  SUBJECTIVE:  CHIEF COMPLAINT:   Chief Complaint  Patient presents with  . Wound Infection   -Still significant swelling of the left hand, oozing skin tears and significant bruising noted from the recent fall  REVIEW OF SYSTEMS:  Review of Systems  Constitutional: Positive for malaise/fatigue. Negative for chills and fever.  HENT: Negative for congestion, ear discharge, hearing loss and nosebleeds.   Eyes: Negative for blurred vision and double vision.  Respiratory: Negative for cough, shortness of breath and wheezing.   Cardiovascular: Negative for chest pain, palpitations and leg swelling.  Gastrointestinal: Negative for abdominal pain, constipation, diarrhea, nausea and vomiting.  Genitourinary: Negative for dysuria.  Musculoskeletal: Positive for falls, joint pain and myalgias.  Neurological: Negative for dizziness, speech change, focal weakness, seizures and headaches.  Psychiatric/Behavioral: Negative for depression.       Some confusion    DRUG ALLERGIES:  No Known Allergies  VITALS:  Blood pressure (!) 178/84, pulse 83, temperature 98 F (36.7 C), temperature source Oral, resp. rate 18, height 5\' 7"  (1.702 m), weight 67.8 kg (149 lb 8 oz), SpO2 94 %.  PHYSICAL EXAMINATION:  Physical Exam  GENERAL:  84 y.o.-year-old elderly patient lying in the bed with no acute distress.  EYES: Pupils equal, round, reactive to light and accommodation. No scleral icterus. Extraocular muscles intact.  HEENT: Head atraumatic, normocephalic. Oropharynx and nasopharynx clear.  NECK:  Supple, no jugular venous distention. No thyroid enlargement, no tenderness.  LUNGS: Normal breath sounds bilaterally, no wheezing, rales,rhonchi or crepitation. No use of accessory muscles of respiration. Decreased bibasilar breath sounds CARDIOVASCULAR: S1, S2 normal.  No rubs, or gallops. 2/6 systolic murmur is present ABDOMEN: Soft, nontender, nondistended. Bowel sounds present. No organomegaly or mass.  EXTREMITIES: No pedal edema, cyanosis, or clubbing.  Left hand dorsum is swollen extending onto the distal forearm. Red and tender to touch. Bruising with open skin tears with serosanguineous discharge noted NEUROLOGIC: Cranial nerves II through XII are intact. Muscle strength 5/5 in all extremities. Sensation intact. Gait not checked.  PSYCHIATRIC: The patient is alert and oriented x 3. Mild cognitive deficit noted SKIN: No obvious rash, lesion, or ulcer.    LABORATORY PANEL:   CBC Recent Labs  Lab 06/21/17 0347  WBC 10.4  HGB 11.8*  HCT 34.7*  PLT 236   ------------------------------------------------------------------------------------------------------------------  Chemistries  Recent Labs  Lab 06/20/17 1729  06/22/17 0329  NA 140   < > 139  K 4.3   < > 3.7  CL 106   < > 105  CO2 22   < > 24  GLUCOSE 113*   < > 100*  BUN 38*   < > 24*  CREATININE 1.02   < > 0.90  CALCIUM 9.3   < > 8.8*  AST 21  --   --   ALT <5*  --   --   ALKPHOS 59  --   --   BILITOT 1.0  --   --    < > = values in this interval not displayed.   ------------------------------------------------------------------------------------------------------------------  Cardiac Enzymes No results for input(s): TROPONINI in the last 168 hours. ------------------------------------------------------------------------------------------------------------------  RADIOLOGY:  Dg Hand 2 View Left  Result Date: 06/21/2017 CLINICAL DATA:  Hand cellulitis EXAM: LEFT HAND - 2 VIEW COMPARISON:  None. FINDINGS: Study is limited by positioning, there is  flexed positioning of the digits with overlap of bony structures. No gross fracture is seen. No definitive periostitis or bone destruction. Diffuse soft tissue swelling without soft tissue gas. IMPRESSION: Limited secondary to  positioning. No definite acute osseous abnormality. Electronically Signed   By: Donavan Foil M.D.   On: 06/21/2017 00:11   Dg Hand Complete Right  Result Date: 06/20/2017 CLINICAL DATA:  Pain following fall EXAM: RIGHT HAND - COMPLETE 3+ VIEW COMPARISON:  None. FINDINGS: Frontal, oblique, and lateral views were obtained. There is a bandage medially. No other radiopaque foreign body. There is no appreciable fracture or dislocation. There is osteoarthritic change in the first, second, and third MCP joints as well as in the first IP joint. There is also osteoarthritic change in the fifth PIP and all DIP joints. No erosive change. 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 No fracture or dislocation. Bandage medially. Electronically Signed   By: Lowella Grip III M.D.   On: 06/20/2017 19:52    EKG:   Orders placed or performed during the hospital encounter of 06/13/17  . ED EKG  . ED EKG  . EKG 12-Lead  . EKG 12-Lead  . EKG 12-Lead  . EKG 12-Lead    ASSESSMENT AND PLAN:   84 year old male with past medical history significant for Parkinson's disease, orthostatic hypertension, neuropathy, hypertension presents to hospital secondary to fall and left hand swelling.  1. Left hand cellulitis-triggered from fall and laceration. -Still pretty swollen and tender. Keep the hand elevated. -Continue IV antibiotics -Pain medications. -Open oozing skin tears, we'll get wound consult  2. Hypertension-continue home medications. Patient on Toprol, Avapro and Norvasc. -Known history of orthostatic hypotension from Parkinson's disease.  3. Falls- Partly from orthostatic hypotension and also gait instability from Parkinson's. -Physical therapy consulted -Check orthostatics when working with physical therapy  4. Parkinson's disease-continue Sinemet  5. BPH-on Flomax and Proscar  6. DVT prophylaxis-on Lovenox  Physical therapy consulted.  Possible discharge tomorrow    All  the records are reviewed and case discussed with Care Management/Social Workerr. Management plans discussed with the patient, family and they are in agreement.  CODE STATUS: Full code  TOTAL TIME TAKING CARE OF THIS PATIENT: 36 minutes.   POSSIBLE D/C  TOMORROW, DEPENDING ON CLINICAL CONDITION.   Gladstone Lighter M.D on 06/22/2017 at 9:32 AM  Between 7am to 6pm - Pager - [phone]  After 6pm go to [email or web address] - Proofreader  [facility]  Office  [phone]  CC: Primary care physician; [facility]

## 2017-06-22 NOTE — Clinical Social Work Note (Signed)
Clinical Social Work Assessment  Patient Details  Name: Ivan Clark MRN: [id number] Date of Birth: 1929/01/21  Date of referral:  06/22/17               Reason for consult:  Facility Placement                Permission sought to share information with:  Chartered certified accountant granted to share information::  Yes, Verbal Permission Granted  Name::      [place]::   [place]   Relationship::     Contact Information:     Housing/Transportation Living arrangements for the past 2 months:  [place] of Information:  Patient, Adult Children Patient Interpreter Needed:  None Criminal Activity/Legal Involvement Pertinent to Current Situation/Hospitalization:  No - Comment as needed Significant Relationships:  Adult Children Lives with:  Facility Resident Do you feel safe going back to the place where you live?  Yes Need for family participation in patient care:  Yes (Comment)  Care giving concerns:  Patient is a resident at [facility].    Social Worker assessment / plan:  Holiday representative (CSW) reviewed chart and noted that patient is from [facility]. PT is recommending SNF. CSW met with patient and his daughter Ivan Clark [phone] was at bedside. Patient was alert and oriented X4 and was laying in the bed. CSW introduced self and explained role of CSW department. Per daughter patient has been at [facility] for several years and walks with a walker at baseline. Per patient and daughter patient is now requiring more assistance than the ALF can provide. Patient and daughter are requesting SNF placement for rehab. CSW explained that [facility] will have to approve SNF. Daughter verbalized her understanding and SNF list was provided. FL2 complete and faxed out. CSW started [facility] SN[facility] authorization through [place] [facility]. CSW will continue to follow and assist as needed.   Employment status:  Disabled  (Comment on whether or not currently receiving Disability) Insurance information:  Managed Medicare PT Recommendations:  [place] / Referral to community resources:  [place]  Patient/Family's Response to care:  Patient and his daughter are agreeable to SNF search in [place].   Patient/Family's Understanding of and Emotional Response to Diagnosis, Current Treatment, and Prognosis:  Patient and his daughter were very pleasant and thanked CSW for assistance.   Emotional Assessment Appearance:  Appears stated age Attitude/Demeanor/Rapport:    Affect (typically observed):  Accepting, Adaptable, Pleasant Orientation:  Oriented to Self, Oriented to Place, Oriented to  Time, Fluctuating Orientation (Suspected and/or reported [place]) Alcohol / Substance use:  Not Applicable Psych involvement (Current and /or in the community):  No (Comment)  Discharge Needs  Concerns to be addressed:  Discharge Planning Concerns Readmission within the last 30 days:  No Current discharge risk:  Dependent with Mobility Barriers to Discharge:  Continued Medical Work up   [employer], Veronia Beets, LCSW 06/22/2017, 8:10 PM

## 2017-06-22 NOTE — Consult Note (Signed)
[place] Nurse wound consult note Reason for Consult:bilateral injury to dorsal hands from recent falls, 1 week apart. Suture line to right dorsal hand near fifth finger.  This finger is also splinted and wrapped with kerlix.  Digit noted to be bruised.   Left dorsal hand with trauma wound with skin defect noted and devitalized tissue to wound bed.  Will begin enzymatic debridement daily.   Wound type:trauma from falls Pressure Injury POA: NA Measurement:LEft hand:  4 cm x 4.6 cm x 0.1 cm Right hand is 3 cm suture line Wound LXB:WIOMB red and yellow, devitalized tissue Drainage (amount, consistency, odor) minimal serosanguinous   No odor Periwound:edema and erythema Dressing procedure/placement/frequency:Cleanse left dorsal hand with NS and pat dry.  APPLY Santyl to wound bed.  Cover with NS moist gauze.  Secure with silicone border foam dressing. Daily.  Silicone foam to right hand suture line Will not follow at this time.  Please re-consult if needed.  Domenic Moras RN BSN [place] Pager [phone]

## 2017-06-22 NOTE — Clinical Social Work Placement (Signed)
   CLINICAL SOCIAL WORK PLACEMENT  NOTE  Date:  06/22/2017  Patient Details  Name: Ivan Clark MRN: [id number] Date of Birth: 1929-02-04  Clinical Social Work is seeking post-discharge placement for this patient at the [place] level of care (*CSW will initial, date and re-position this form in  chart as items are completed):  Yes   Patient/family provided with [place] Work Department's list of facilities offering this level of care within the geographic area requested by the patient (or if unable, by the patient's family).  Yes   Patient/family informed of their freedom to choose among providers that offer the needed level of care, that participate in Medicare, Medicaid or managed care program needed by the patient, have an available bed and are willing to accept the patient.  Yes   Patient/family informed of [facility]'s ownership interest in [facility] and [facility], as well as of the fact that they are under no obligation to receive care at these facilities.  PASRR submitted to EDS on       PASRR number received on       Existing PASRR number confirmed on 06/22/17     FL2 transmitted to all facilities in geographic area requested by pt/family on 06/22/17     FL2 transmitted to all facilities within larger geographic area on       Patient informed that his/her managed care company has contracts with or will negotiate with certain facilities, including the following:            Patient/family informed of bed offers received.  Patient chooses bed at       Physician recommends and patient chooses bed at      Patient to be transferred to   on  .  Patient to be transferred to facility by       Patient family notified on   of transfer.  Name of family member notified:        PHYSICIAN       Additional Comment:    _______________________________________________ Benecio Kluger, Veronia Beets, LCSW 06/22/2017, 8:08 PM

## 2017-06-22 NOTE — Care Management (Addendum)
Patient from [facility]. [facility]- however patient will need to be evaluated by PT for safety with this walker (daughter requested platform walker).  PT pending. [place] updated.  Patient is currently followed by [facility] for PT/OT but [facility] will need to be added and I have updated [name] with [facility] of this need:  [place] Nurse wound consult note Reason for Consult:bilateral injury to dorsal hands from recent falls, 1 week apart. Suture line to right dorsal hand near fifth finger.  This finger is also splinted and wrapped with kerlix.  Digit noted to be bruised.   Left dorsal hand with trauma wound with skin defect noted and devitalized tissue to wound bed.  Will begin enzymatic debridement daily.   Wound type:trauma from falls Pressure Injury POA: NA Measurement:LEft hand:  4 cm x 4.6 cm x 0.1 cm Right hand is 3 cm suture line Wound WYO:VZCHY red and yellow, devitalized tissue Drainage (amount, consistency, odor) minimal serosanguinous   No odor Periwound:edema and erythema Dressing procedure/placement/frequency:Cleanse left dorsal hand with NS and pat dry.  APPLY Santyl to wound bed.  Cover with NS moist gauze.  Secure with silicone border foam dressing. Daily.  Silicone foam to right hand suture line

## 2017-06-23 ENCOUNTER — Encounter
Admission: RE | Admit: 2017-06-23 | Discharge: 2017-06-23 | Disposition: A | Discharge status: Home / Self Care | Payer: Medicare Other | Source: Ambulatory Visit | Attending: Internal Medicine | Admitting: Internal Medicine

## 2017-06-23 DIAGNOSIS — Z7401 Bed confinement status: Secondary | ICD-10-CM | POA: Diagnosis not present

## 2017-06-23 DIAGNOSIS — M6281 Muscle weakness (generalized): Secondary | ICD-10-CM | POA: Diagnosis not present

## 2017-06-23 MED ORDER — COLLAGENASE 250 UNIT/GM EX OINT: TOPICAL_OINTMENT | Freq: Every day | CUTANEOUS | 0 refills | Status: DC

## 2017-06-23 MED ORDER — CARBIDOPA-LEVODOPA 25-100 MG PO TABS: 0.5000 | ORAL_TABLET | Freq: Three times a day (TID) | ORAL | 0 refills | Status: DC

## 2017-06-23 MED ORDER — OXYCODONE HCL 5 MG PO TABS: 5.0000 mg | ORAL_TABLET | Freq: Four times a day (QID) | ORAL | 0 refills | Status: DC | PRN

## 2017-06-23 MED ORDER — ACETAMINOPHEN 500 MG PO TABS: 500.0000 mg | ORAL_TABLET | Freq: Four times a day (QID) | ORAL | 0 refills | Status: AC | PRN

## 2017-06-23 MED ORDER — AMOXICILLIN-POT CLAVULANATE 875-125 MG PO TABS: 1.0000 | ORAL_TABLET | Freq: Two times a day (BID) | ORAL | 0 refills | Status: AC

## 2017-06-23 MED ORDER — IRBESARTAN 150 MG PO TABS: 150.0000 mg | ORAL_TABLET | Freq: Every day | ORAL | 2 refills | Status: AC

## 2017-06-23 MED ORDER — BISACODYL 10 MG RE SUPP
10.0000 mg | Freq: Once | RECTAL | Status: AC
  Administered 2017-06-23: 10 mg via RECTAL
  Filled 2017-06-23: qty 1

## 2017-06-23 NOTE — Progress Notes (Signed)
Pt in no acute distress. Report called to [facility] facility. IV removed. EMS called to transport pt to [facility] facility. VSS. Family updated.

## 2017-06-23 NOTE — Progress Notes (Signed)
[facility] [place] NAME: Ivan Clark    MR#:  [id number]  DATE OF BIRTH:  1929/03/29  SUBJECTIVE:  CHIEF COMPLAINT:   Chief Complaint  Patient presents with  . Wound Infection   -doing well, weak while working with PT - left hand swelling much improved  REVIEW OF SYSTEMS:  Review of Systems  Constitutional: Positive for malaise/fatigue. Negative for chills and fever.  HENT: Negative for congestion, ear discharge, hearing loss and nosebleeds.   Eyes: Negative for blurred vision and double vision.  Respiratory: Negative for cough, shortness of breath and wheezing.   Cardiovascular: Negative for chest pain, palpitations and leg swelling.  Gastrointestinal: Negative for abdominal pain, constipation, diarrhea, nausea and vomiting.  Genitourinary: Negative for dysuria.  Musculoskeletal: Positive for falls, joint pain and myalgias.  Neurological: Negative for dizziness, speech change, focal weakness, seizures and headaches.  Psychiatric/Behavioral: Negative for depression.       Some confusion    DRUG ALLERGIES:  No Known Allergies  VITALS:  Blood pressure (!) 172/73, pulse 70, temperature 98.5 F (36.9 C), temperature source Oral, resp. rate 18, height 5\' 7"  (1.702 m), weight 67.8 kg (149 lb 8 oz), SpO2 95 %.  PHYSICAL EXAMINATION:  Physical Exam  GENERAL:  84 y.o.-year-old elderly patient lying in the bed with no acute distress.  EYES: Pupils equal, round, reactive to light and accommodation. No scleral icterus. Extraocular muscles intact.  HEENT: Head atraumatic, normocephalic. Oropharynx and nasopharynx clear.  NECK:  Supple, no jugular venous distention. No thyroid enlargement, no tenderness.  LUNGS: Normal breath sounds bilaterally, no wheezing, rales,rhonchi or crepitation. No use of accessory muscles of respiration. Decreased bibasilar breath sounds CARDIOVASCULAR: S1, S2 normal. No rubs, or gallops. 2/6 systolic  murmur is present ABDOMEN: Soft, nontender, nondistended. Bowel sounds present. No organomegaly or mass.  EXTREMITIES: No pedal edema, cyanosis, or clubbing.  Left hand dorsum swelling is much improved, edges of the wound are trimmed and some granulation tissue seen, also some purulent discharge Right hand dorsum with sutures in place for 4th dorsal tendon, no swelling or discharge seen NEUROLOGIC: Cranial nerves II through XII are intact. Muscle strength 5/5 in all extremities. Sensation intact. Gait not checked. Global weakness PSYCHIATRIC: The patient is alert and oriented x 3. Mild cognitive deficit noted SKIN: No obvious rash, lesion, or ulcer.    LABORATORY PANEL:   CBC Recent Labs  Lab 06/21/17 0347  WBC 10.4  HGB 11.8*  HCT 34.7*  PLT 236   ------------------------------------------------------------------------------------------------------------------  Chemistries  Recent Labs  Lab 06/20/17 1729  06/22/17 0329  NA 140   < > 139  K 4.3   < > 3.7  CL 106   < > 105  CO2 22   < > 24  GLUCOSE 113*   < > 100*  BUN 38*   < > 24*  CREATININE 1.02   < > 0.90  CALCIUM 9.3   < > 8.8*  AST 21  --   --   ALT <5*  --   --   ALKPHOS 59  --   --   BILITOT 1.0  --   --    < > = values in this interval not displayed.   ------------------------------------------------------------------------------------------------------------------  Cardiac Enzymes No results for input(s): TROPONINI in the last 168 hours. ------------------------------------------------------------------------------------------------------------------  RADIOLOGY:  No results found.  EKG:   Orders placed or performed during the hospital encounter of 06/13/17  . ED EKG  .  ED EKG  . EKG 12-Lead  . EKG 12-Lead  . EKG 12-Lead  . EKG 12-Lead    ASSESSMENT AND PLAN:   84 year old male with past medical history significant for Parkinson's disease, orthostatic hypertension, neuropathy, hypertension  presents to hospital secondary to fall and left hand swelling.  1. Left hand cellulitis-triggered from fall and laceration. -much improved. Keep the hand elevated. -receiving IV antibiotics with ancef- change to oral at discharge -Pain medications. -appreciate wound consult and dressing changes as recommended  2. Hypertension-continue home medications. Patient on Toprol, Avapro and Norvasc. -Known history of orthostatic hypotension from Parkinson's disease. So his lying BP might be a little high- monitor  3. Falls- Partly from orthostatic hypotension and also gait instability from Parkinson's. -Physical therapy consulted- needs rehab for now -monitor orthostatics when working with physical therapy  4. Parkinson's disease-continue Sinemet and azilect  5. BPH-on Flomax and Proscar  6. DVT prophylaxis-on Lovenox  Physical therapy consulted.  Recommended SNF- discharge when bed available    All the records are reviewed and case discussed with Care Management/Social Workerr. Management plans discussed with the patient, family and they are in agreement.  CODE STATUS: Full code  TOTAL TIME TAKING CARE OF THIS PATIENT: 36 minutes.   POSSIBLE D/C  1-2 days, DEPENDING ON CLINICAL CONDITION.   Gladstone Lighter M.D on 06/23/2017 at 8:54 AM  Between 7am to 6pm - Pager - [phone]  After 6pm go to [email or web address] - Proofreader  [facility]  Office  [phone]  CC: Primary care physician; [facility]

## 2017-06-23 NOTE — Clinical Social Work Placement (Signed)
   CLINICAL SOCIAL WORK PLACEMENT  NOTE  Date:  06/23/2017  Patient Details  Name: Ivan Clark MRN: [id number] Date of Birth: 25-Aug-1928  Clinical Social Work is seeking post-discharge placement for this patient at the [place] level of care (*CSW will initial, date and re-position this form in  chart as items are completed):  Yes   Patient/family provided with [facility] Work Department's list of facilities offering this level of care within the geographic area requested by the patient (or if unable, by the patient's family).  Yes   Patient/family informed of their freedom to choose among providers that offer the needed level of care, that participate in Medicare, Medicaid or managed care program needed by the patient, have an available bed and are willing to accept the patient.  Yes   Patient/family informed of [facility]'s ownership interest in [facility] and [facility], as well as of the fact that they are under no obligation to receive care at these facilities.  PASRR submitted to EDS on       PASRR number received on       Existing PASRR number confirmed on 06/22/17     FL2 transmitted to all facilities in geographic area requested by pt/family on 06/22/17     FL2 transmitted to all facilities within larger geographic area on       Patient informed that his/her managed care company has contracts with or will negotiate with certain facilities, including the following:        Yes   Patient/family informed of bed offers received.  Patient chooses bed at [facility] )     Physician recommends and patient chooses bed at      Patient to be transferred to [facility] ) on 06/23/17.  Patient to be transferred to facility by [facility] )     Patient family notified on 06/23/17 of transfer.  Name of family member notified:  (Patient's daughters [name] and [name] are aware of D/C today. )     PHYSICIAN        Additional Comment:    _______________________________________________ Zachrey Deutscher, Veronia Beets, LCSW 06/23/2017, 3:00 PM

## 2017-06-23 NOTE — Progress Notes (Signed)
Clinical Education officer, museum (CSW) presented bed offers to patient's daughters [name] and [name], they chose [employer] Medicare SNF authorization is pending. [facility] admissions coordinator at [facility] is aware of above.   [employer], LCSW [phone]

## 2017-06-23 NOTE — Discharge Summary (Signed)
[facility] [facility] [place] NAME: Ivan Clark    MR#:  [id number]  DATE OF BIRTH:  1929-03-23  DATE OF ADMISSION:  06/20/2017   ADMITTING PHYSICIAN: Lance Coon, MD  DATE OF DISCHARGE:  06/23/17  PRIMARY CARE PHYSICIAN: [facility]   ADMISSION DIAGNOSIS:   Cellulitis of left upper extremity [L03.114] Laceration of right hand without foreign body, initial encounter [S61.411A]  DISCHARGE DIAGNOSIS:   Principal Problem:   Cellulitis of left upper extremity Active Problems:   Essential hypertension   Hyperlipidemia   Benign prostatic hyperplasia   Cellulitis of left hand   SECONDARY DIAGNOSIS:   Past Medical History:  Diagnosis Date  . Aneurysm of infrarenal abdominal aorta (HCC) SUPRAILIAC  4CM PER CT 10-20-2010--  STABLE  . Bladder cancer ([place]) RECURRENT  (FIRST DX  2001)   HX TURBT AND CHEMO BLADDER INSTILLATION TX'S  . Frequency of urination   . Hyperlipidemia   . Hypertension   . Nocturia   . Orthostatic hypotension   . Parkinson disease ([place])   . Peripheral neuropathy     HOSPITAL COURSE:   84 year old male with past medical history significant for Parkinson's disease, orthostatic hypertension, neuropathy, hypertension presents to hospital secondary to fall and left hand swelling.  1. Left hand cellulitis-triggered from fall and laceration. -much improved. Keep the hand elevated. -receiving IV antibiotics with ancef- change to oral at discharge -Pain medications. -appreciate wound consult and dressing changes as recommended  2. Hypertension-continue home medications. Patient on Toprol, Avapro and Norvasc. -Known history of orthostatic hypotension from Parkinson's disease. So his lying BP might be a little high- monitor  3. Falls- Partly from orthostatic hypotension and also gait instability from Parkinson's. -Physical therapy consulted- needs rehab for now -monitor orthostatics when working with  physical therapy  4. Parkinson's disease-continue Sinemet and azilect  5. BPH-on Flomax and Proscar   Physical therapy consulted.  Recommended SNF- discharge today    DISCHARGE CONDITIONS:   Guarded  CONSULTS OBTAINED:   None  DRUG ALLERGIES:   No Known Allergies DISCHARGE MEDICATIONS:   Allergies as of 06/23/2017   No Known Allergies     Medication List    STOP taking these medications   naproxen sodium 220 MG tablet Commonly known as:  ALEVE     TAKE these medications   acetaminophen 500 MG tablet Commonly known as:  TYLENOL Take 1 tablet (500 mg total) by mouth every 6 (six) hours as needed for mild pain, fever or headache. What changed:    how much to take  when to take this  reasons to take this   amLODipine 10 MG tablet Commonly known as:  NORVASC Take 10 mg by mouth daily.   amoxicillin-clavulanate 875-125 MG tablet Commonly known as:  AUGMENTIN Take 1 tablet by mouth 2 (two) times daily for 8 days. X 8 more days   ASPERCREME LIDOCAINE 4 % Ptch Generic drug:  Lidocaine Apply 1 patch topically daily at 12 noon. Apply patch at 6am and remove at 6pm   aspirin EC 81 MG tablet Take 1 tablet (81 mg total) by mouth daily after supper.   atorvastatin 20 MG tablet Commonly known as:  LIPITOR Take 1 tablet (20 mg total) by mouth daily.   carbidopa-levodopa 25-100 MG tablet Commonly known as:  SINEMET IR Take 0.5 tablets by mouth 3 (three) times daily.   cholecalciferol 1000 units tablet Commonly known as:  VITAMIN D Take 2,000 Units by  mouth daily.   collagenase ointment Commonly known as:  SANTYL Apply topically daily. Start taking on:  06/24/2017   finasteride 5 MG tablet Commonly known as:  PROSCAR Take 5 mg by mouth every evening.   fluticasone 50 MCG/ACT nasal spray Commonly known as:  FLONASE Place 1 spray into both nostrils 2 (two) times daily as needed (runny nose/ sneezing).   irbesartan 150 MG tablet Commonly known as:   AVAPRO Take 1 tablet (150 mg total) by mouth daily.   metoprolol succinate 25 MG 24 hr tablet Commonly known as:  TOPROL-XL Take 25 mg by mouth daily.   miconazole 2 % cream Commonly known as:  MICOTIN Apply 1 application topically daily at 12 noon. Apply to groin and scrotum   nystatin powder Generic drug:  nystatin Apply 2 g topically 2 (two) times daily as needed (redness/ burning of scrotum). What changed:  Another medication with the same name was removed. Continue taking this medication, and follow the directions you see here.   oxyCODONE 5 MG immediate release tablet Commonly known as:  Oxy IR/ROXICODONE Take 1 tablet (5 mg total) by mouth every 6 (six) hours as needed for moderate pain or severe pain.   rasagiline 1 MG Tabs tablet Commonly known as:  AZILECT Take 1 tablet (1 mg total) by mouth daily.   sennosides-docusate sodium 8.6-50 MG tablet Commonly known as:  SENOKOT-S Take 1 tablet by mouth every evening.   SYSTANE 0.4-0.3 % Soln Generic drug:  Polyethyl Glycol-Propyl Glycol Apply 1 application to eye 4 (four) times daily as needed (dry eyes).   tamsulosin 0.4 MG Caps capsule Commonly known as:  FLOMAX Take 1 capsule (0.4 mg total) by mouth daily after supper.        DISCHARGE INSTRUCTIONS:   1. PCP f/u in 1 week 2. Dressing changes daily to left hand wound as recommended  DIET:   Cardiac diet  ACTIVITY:   Activity as tolerated  OXYGEN:   Home Oxygen: No.  Oxygen Delivery: room air  DISCHARGE LOCATION:   nursing home   If you experience worsening of your admission symptoms, develop shortness of breath, life threatening emergency, suicidal or homicidal thoughts you must seek medical attention immediately by calling 911 or calling your MD immediately  if symptoms less severe.  You Must read complete instructions/literature along with all the possible adverse reactions/side effects for all the Medicines you take and that have been prescribed  to you. Take any new Medicines after you have completely understood and accpet all the possible adverse reactions/side effects.   Please note  You were cared for by a hospitalist during your hospital stay. If you have any questions about your discharge medications or the care you received while you were in the hospital after you are discharged, you can call the unit and asked to speak with the hospitalist on call if the hospitalist that took care of you is not available. Once you are discharged, your primary care physician will handle any further medical issues. Please note that NO REFILLS for any discharge medications will be authorized once you are discharged, as it is imperative that you return to your primary care physician (or establish a relationship with a primary care physician if you do not have one) for your aftercare needs so that they can reassess your need for medications and monitor your lab values.    On the day of Discharge:  VITAL SIGNS:   Blood pressure (!) 172/73, pulse 70, temperature 98.5  F (36.9 C), temperature source Oral, resp. rate 18, height 5\' 7"  (1.702 m), weight 67.8 kg (149 lb 8 oz), SpO2 95 %.  PHYSICAL EXAMINATION:    GENERAL:  84 y.o.-year-old elderly patient lying in the bed with no acute distress.  EYES: Pupils equal, round, reactive to light and accommodation. No scleral icterus. Extraocular muscles intact.  HEENT: Head atraumatic, normocephalic. Oropharynx and nasopharynx clear.  NECK:  Supple, no jugular venous distention. No thyroid enlargement, no tenderness.  LUNGS: Normal breath sounds bilaterally, no wheezing, rales,rhonchi or crepitation. No use of accessory muscles of respiration. Decreased bibasilar breath sounds CARDIOVASCULAR: S1, S2 normal. No rubs, or gallops. 2/6 systolic murmur is present ABDOMEN: Soft, nontender, nondistended. Bowel sounds present. No organomegaly or mass.  EXTREMITIES: No pedal edema, cyanosis, or clubbing.  Left hand  dorsum swelling is much improved, edges of the wound are trimmed and some granulation tissue seen, also some purulent discharge Right hand dorsum with sutures in place for 4th dorsal tendon, no swelling or discharge seen NEUROLOGIC: Cranial nerves II through XII are intact. Muscle strength 5/5 in all extremities. Sensation intact. Gait not checked. Global weakness PSYCHIATRIC: The patient is alert and oriented x 3. Mild cognitive deficit noted SKIN: No obvious rash, lesion, or ulcer.     DATA REVIEW:   CBC Recent Labs  Lab 06/21/17 0347  WBC 10.4  HGB 11.8*  HCT 34.7*  PLT 236    Chemistries  Recent Labs  Lab 06/20/17 1729  06/22/17 0329  NA 140   < > 139  K 4.3   < > 3.7  CL 106   < > 105  CO2 22   < > 24  GLUCOSE 113*   < > 100*  BUN 38*   < > 24*  CREATININE 1.02   < > 0.90  CALCIUM 9.3   < > 8.8*  AST 21  --   --   ALT <5*  --   --   ALKPHOS 59  --   --   BILITOT 1.0  --   --    < > = values in this interval not displayed.     Microbiology Results  Results for orders placed or performed during the hospital encounter of 06/20/17  Culture, blood (routine x 2)     Status: None (Preliminary result)   Collection Time: 06/20/17 10:43 PM  Result Value Ref Range Status   Specimen Description BLOOD LEFT FOREARM  Final   Special Requests   Final    BOTTLES DRAWN AEROBIC AND ANAEROBIC Blood Culture adequate volume   Culture   Final    NO GROWTH 3 DAYS Performed at [facility], [address] [place], [place] [address]    Report Status PENDING  Incomplete  Culture, blood (routine x 2)     Status: None (Preliminary result)   Collection Time: 06/20/17 10:43 PM  Result Value Ref Range Status   Specimen Description BLOOD RIGHT FOREARM  Final   Special Requests   Final    BOTTLES DRAWN AEROBIC AND ANAEROBIC Blood Culture adequate volume   Culture   Final    NO GROWTH 3 DAYS Performed at [facility], [address], [place], [place] [address]      Report Status PENDING  Incomplete  MRSA PCR Screening     Status: None   Collection Time: 06/21/17  2:14 AM  Result Value Ref Range Status   MRSA by PCR NEGATIVE NEGATIVE Final  Comment:        The GeneXpert MRSA Assay (FDA approved for NASAL specimens only), is one component of a comprehensive MRSA colonization surveillance program. It is not intended to diagnose MRSA infection nor to guide or monitor treatment for MRSA infections. Performed at [facility], [address] [place], [place] [address]     RADIOLOGY:  No results found.   Management plans discussed with the patient, family and they are in agreement.  CODE STATUS:     Code Status Orders  (From admission, onward)        Start     Ordered   06/21/17 0125  Full code  Continuous     06/21/17 0124    Code Status History    Date Active Date Inactive Code Status Order ID Comments User Context   04/14/2013 16:06 04/23/2013 12:05 Full Code [id number]  Elizabeth Sauer Inpatient   04/09/2013 17:46 04/14/2013 16:06 Full Code [id number]  Mendel Corning, MD Inpatient   03/01/2013 02:37 03/03/2013 22:42 Full Code [id number]  Toy Baker, MD Inpatient    Advance Directive Documentation     Most Recent Value  Type of Advance Directive  Healthcare Power of Attorney, Living will  Pre-existing out of facility DNR order (yellow form or pink MOST form)  No data  "MOST" Form in Place?  No data      TOTAL TIME TAKING CARE OF THIS PATIENT: 38 minutes.    Gladstone Lighter M.D on 06/23/2017 at 10:09 AM  Between 7am to 6pm - Pager - [phone]  After 6pm go to [email or web address] - Proofreader  [facility]  Office  [phone]  CC: Primary care physician; [facility]   Note: This dictation was prepared with Dragon dictation along with smaller phrase technology. Any transcriptional errors that result from this process are unintentional.

## 2017-06-23 NOTE — Progress Notes (Signed)
[facility] Medicare SNF authorization has been received auth # [id number], RVB. Patient is medically stable for D/C to [facility] today. Per [facility] admissions coordinator at [facility] patient can come today to room 204-B. RN will call report at [phone] and arrange EMS for transport. Clinical Education officer, museum (CSW) sent D/C orders to [employer] via [employer]. Patient is aware of above. Patient's daughters [name] and [name] are aware of above. Please reconsult if future social work needs arise. CSW signing off.   [employer], LCSW [phone]

## 2017-06-23 NOTE — Progress Notes (Signed)
Per [facility] Medicare case manager the Market researcher will review the case.   [employer], LCSW [phone]

## 2017-06-24 ENCOUNTER — Other Ambulatory Visit: Payer: Self-pay

## 2017-06-24 DIAGNOSIS — L03114 Cellulitis of left upper limb: Secondary | ICD-10-CM | POA: Diagnosis not present

## 2017-06-24 DIAGNOSIS — G20 Parkinson's disease: Secondary | ICD-10-CM | POA: Diagnosis not present

## 2017-06-24 DIAGNOSIS — L0390 Cellulitis, unspecified: Secondary | ICD-10-CM | POA: Insufficient documentation

## 2017-06-24 MED ORDER — OXYCODONE HCL 5 MG PO TABS: 5.0000 mg | ORAL_TABLET | Freq: Four times a day (QID) | ORAL | 0 refills | Status: DC | PRN

## 2017-06-24 NOTE — Telephone Encounter (Signed)
Rx sent to [facility] phone : [phone] , fax : [phone]  

## 2017-06-25 LAB — CULTURE, BLOOD (ROUTINE X 2)
Culture: NO GROWTH
Culture: NO GROWTH
Special Requests: ADEQUATE
Special Requests: ADEQUATE

## 2017-07-03 ENCOUNTER — Encounter: Payer: Self-pay | Admitting: Gerontology

## 2017-07-03 ENCOUNTER — Non-Acute Institutional Stay (SKILLED_NURSING_FACILITY): Payer: Medicare Other | Admitting: Gerontology

## 2017-07-03 DIAGNOSIS — G243 Spasmodic torticollis: Secondary | ICD-10-CM

## 2017-07-03 DIAGNOSIS — L03114 Cellulitis of left upper limb: Secondary | ICD-10-CM

## 2017-07-03 NOTE — Progress Notes (Signed)
Location:   [facility] Room Number: [phone] Place of Service:  SNF [phone]) Provider:  Toni Arthurs, NP-C  [facility]  Patient Care Team: [facility] as PCP - General (Geriatric Medicine)  Extended Emergency Contact Information Primary Emergency Contact: Murray,Wendi Address: [address]          [place], [place] [address] Johnnette Litter of [place] Phone: [phone] Work Phone: [phone] Mobile Phone: [phone] Relation: Daughter Secondary Emergency Contact: Eli Phillips Address: [address]          [place], [place] [address] Johnnette Litter of [place] Phone: [phone] Relation: Daughter  Code Status: DNR Goals of care: Advanced Directive information Advanced Directives 07/03/2017  Does Patient Have a Medical Advance Directive? Yes  Type of Advance Directive Out of facility DNR (pink MOST or yellow form)  Does patient want to make changes to medical advance directive? No - Patient declined  Copy of [place] in Chart? -  Would patient like information on creating a medical advance directive? -  Pre-existing out of facility DNR order (yellow form or pink MOST form) -     Chief Complaint  Patient presents with  . Medical Management of Chronic Issues    Routine Visit    HPI:  Pt is a 84 y.o. male seen today for medical management of chronic diseases. Pt was admitted to the facility for rehab following hospitalization at [facility] for cellulitis/ laceration of the Left Hand. Pt has comorbidities significant for Parkinson's Disease with cervical dystonia/ generalized weakness. He has been receiving PO Augmentin for infection. Coarse is scheduled to complete today. Pt is also getting daily wet to dry dressing changes with Santyl ointment for debridement to B- hands. Sutures intact to right hand. Pt has been participating in OT for strengthening and wheelchair manipulation. Denies pain  except in the neck muscles d/t the cervical dystonia with malpositioning. Voltaren ordered for this. Otherwise, pt reports he is feeling well, VSS and has no other complaints.        Past Medical History:  Diagnosis Date  . Aneurysm of infrarenal abdominal aorta (HCC) SUPRAILIAC  4CM PER CT 10-20-2010--  STABLE  . Basal cell carcinoma   . Bladder cancer ([place]) RECURRENT  (FIRST DX  2001)   HX TURBT AND CHEMO BLADDER INSTILLATION TX'S  . Frequency of urination   . Hyperlipidemia    unspecified  . Hypertension   . Nocturia   . Orthostatic hypotension   . Parkinson disease ([place])   . Peripheral neuropathy    Past Surgical History:  Procedure Laterality Date  . APPENDECTOMY  10-20-2010  . CYSTO/ BLADDER BX/ FULGERATION BLADDER TUMOR  10-09-2004  . CYSTO/ RETROGRADE PYELOGRAM/ BLADDER BX'S  07-03-2004  . CYSTOSCOPY WITH BIOPSY  11/28/2011   Procedure: CYSTOSCOPY WITH BIOPSY;  Surgeon: Fredricka Bonine, MD;  Location: [facility];  Service: Urology;  Laterality: N/A;  . HIP ARTHROPLASTY Right 04/09/2013   Procedure: ARTHROPLASTY BIPOLAR HIP;  Surgeon: Augustin Schooling, MD;  Location: [place];  Service: Orthopedics;  Laterality: Right;  . LEFT INGUINAL HERNIA REPAIR  1996  . TONSILLECTOMY AND ADENOIDECTOMY  1938  . TRANSURETHRAL RESECTION OF BLADDER TUMOR  09-11-1999   BLADDER CANCER  . VASECTOMY      No Known Allergies  Allergies as of 07/03/2017   No Known Allergies     Medication List        Accurate as of 07/03/17  3:35  PM. Always use your most recent med list.          acetaminophen 500 MG tablet Commonly known as:  TYLENOL Take 1 tablet (500 mg total) by mouth every 6 (six) hours as needed for mild pain, fever or headache.   amLODipine 10 MG tablet Commonly known as:  NORVASC Take 10 mg by mouth daily.   aspirin EC 81 MG tablet Take 1 tablet (81 mg total) by mouth daily after supper.   atorvastatin 20 MG tablet Commonly known as:   LIPITOR Take 1 tablet (20 mg total) by mouth daily.   carbidopa-levodopa 25-100 MG tablet Commonly known as:  SINEMET IR Take 1.5 tablets by mouth 3 (three) times daily.   diclofenac sodium 1 % Gel Commonly known as:  VOLTAREN Apply 2 g topically 4 (four) times daily. Massage 2 grams to the back of the neck and shoulders QID for pain, inflammation d/t positioning/ contractures r/t Parkinson's   finasteride 5 MG tablet Commonly known as:  PROSCAR Take 5 mg by mouth every evening.   fluticasone 50 MCG/ACT nasal spray Commonly known as:  FLONASE Place 1 spray into both nostrils 2 (two) times daily as needed (runny nose/ sneezing).   irbesartan 150 MG tablet Commonly known as:  AVAPRO Take 1 tablet (150 mg total) by mouth daily.   metoprolol succinate 25 MG 24 hr tablet Commonly known as:  TOPROL-XL Take 25 mg by mouth daily.   miconazole 2 % cream Commonly known as:  MICOTIN Apply 1 application topically daily. Apply to groin and sacrum   nystatin powder Generic drug:  nystatin Apply 2 g topically 2 (two) times daily as needed (redness/ burning of scrotum).   oxyCODONE 5 MG immediate release tablet Commonly known as:  Oxy IR/ROXICODONE Take 1 tablet (5 mg total) by mouth every 6 (six) hours as needed for moderate pain or severe pain.   rasagiline 1 MG Tabs tablet Commonly known as:  AZILECT Take 1 tablet (1 mg total) by mouth daily.   sennosides-docusate sodium 8.6-50 MG tablet Commonly known as:  SENOKOT-S Take 1 tablet by mouth at bedtime.   SYSTANE 0.4-0.3 % Soln Generic drug:  Polyethyl Glycol-Propyl Glycol Apply 1 application to eye 4 (four) times daily as needed (dry eyes).   tamsulosin 0.4 MG Caps capsule Commonly known as:  FLOMAX Take 1 capsule (0.4 mg total) by mouth daily after supper.   Vitamin D3 2000 units Tabs Take 2,000 Units by mouth daily.       Review of Systems  Constitutional: Negative for activity change, appetite change, chills,  diaphoresis and fever.  HENT: Negative for congestion, mouth sores, nosebleeds, postnasal drip, sneezing, sore throat, trouble swallowing and voice change.   Respiratory: Negative for apnea, cough, choking, chest tightness, shortness of breath and wheezing.   Cardiovascular: Negative for chest pain, palpitations and leg swelling.  Gastrointestinal: Negative for abdominal distention, abdominal pain, constipation, diarrhea and nausea.  Genitourinary: Negative for difficulty urinating, dysuria, frequency and urgency.  Musculoskeletal: Positive for arthralgias (typical arthritis) and neck pain. Negative for gait problem and myalgias.  Skin: Positive for wound. Negative for color change, pallor and rash.  Neurological: Positive for weakness. Negative for dizziness, tremors, syncope, speech difficulty, numbness and headaches.  Psychiatric/Behavioral: Negative for agitation and behavioral problems.  All other systems reviewed and are negative.   Immunization History  Administered Date(s) Administered  . Pneumococcal Polysaccharide-23 04/14/2013  . Tdap 04/08/2017   Pertinent  Health Maintenance Due  Topic Date  Due  . PNA vac Low Risk Adult (2 of 2 - PCV13) 04/14/2014  . INFLUENZA VACCINE  12/10/2016   No flowsheet data found. Functional Status Survey:    Vitals:   07/03/17 1514  BP: (!) 144/52  Pulse: (!) 55  Resp: 16  Temp: 98.1 F (36.7 C)  TempSrc: Oral  SpO2: 96%  Weight: 148 lb 3.2 oz (67.2 kg)  Height: 5\' 7"  (1.702 m)   Body mass index is 23.21 kg/m. Physical Exam  Constitutional: He is oriented to person, place, and time. Vital signs are normal. He appears well-developed and well-nourished. He is active and cooperative. He does not appear ill. No distress.  HENT:  Head: Normocephalic and atraumatic.  Mouth/Throat: Uvula is midline, oropharynx is clear and moist and mucous membranes are normal. Mucous membranes are not pale, not dry and not cyanotic.  Eyes: Conjunctivae,  EOM and lids are normal. Pupils are equal, round, and reactive to light.  Neck: Trachea normal and full passive range of motion without pain. Neck supple. No JVD present. Muscular tenderness present. Decreased range of motion present. No tracheal deviation, no edema and no erythema present. No thyromegaly present.  Cardiovascular: Normal rate, regular rhythm, normal heart sounds, intact distal pulses and normal pulses. Exam reveals no gallop, no distant heart sounds and no friction rub.  No murmur heard. Pulses:      Dorsalis pedis pulses are 2+ on the right side, and 2+ on the left side.  No edema  Pulmonary/Chest: Effort normal and breath sounds normal. No accessory muscle usage. No respiratory distress. He has no decreased breath sounds. He has no wheezes. He has no rhonchi. He has no rales. He exhibits no tenderness.  Abdominal: Soft. Normal appearance and bowel sounds are normal. He exhibits no distension and no ascites. There is no tenderness.  Musculoskeletal: He exhibits no edema.       Cervical back: He exhibits deformity and pain.  Expected osteoarthritis, stiffness; Bilateral Calves soft, supple. Negative Homan's Sign. B- pedal pulses equal, cervical dystonia- muscle pain  Neurological: He is alert and oriented to person, place, and time. He has normal strength. He displays atrophy. He exhibits abnormal muscle tone. Coordination and gait abnormal.  Skin: Skin is warm and dry. Laceration (B- hands- Right hand with sutures) noted. He is not diaphoretic. No cyanosis. No pallor. Nails show no clubbing.  Psychiatric: He has a normal mood and affect. His speech is normal and behavior is normal. Judgment and thought content normal. Cognition and memory are normal.  Nursing note and vitals reviewed.   Labs reviewed: Recent Labs    06/20/17 1729 06/21/17 0347 06/22/17 0329  NA 140 140 139  K 4.3 3.9 3.7  CL 106 107 105  CO2 22 23 24   GLUCOSE 113* 106* 100*  BUN 38* 30* 24*  CREATININE  1.02 0.94 0.90  CALCIUM 9.3 9.0 8.8*   Recent Labs    06/20/17 1729  AST 21  ALT <5*  ALKPHOS 59  BILITOT 1.0  PROT 7.3  ALBUMIN 3.8   Recent Labs    06/13/17 1147 06/20/17 1729 06/21/17 0347  WBC 8.9 11.5* 10.4  NEUTROABS 6.5 9.3*  --   HGB 12.9* 12.3* 11.8*  HCT 37.9* 36.8* 34.7*  MCV 93.6 92.7 92.9  PLT 251 255 236   Lab Results  Component Value Date   TSH 1.241 03/01/2013   No results found for: HGBA1C No results found for: CHOL, HDL, LDLCALC, LDLDIRECT, TRIG, CHOLHDL  Significant  Diagnostic Results in last 30 days:  Dg Ankle Complete Left  Result Date: 06/13/2017 CLINICAL DATA:  Left ankle pain, post unwitnessed fall today. EXAM: LEFT ANKLE COMPLETE - 3+ VIEW COMPARISON:  None. FINDINGS: There is no evidence of fracture, dislocation, or joint effusion. There is no evidence of arthropathy or other focal bone abnormality. Mild medial soft tissue swelling. IMPRESSION: No acute fracture or dislocation identified about the left ankle. Electronically Signed   By: Fidela Salisbury M.D.   On: 06/13/2017 11:34   Ct Head Wo Contrast  Result Date: 06/13/2017 CLINICAL DATA:  Post unwitnessed fall. EXAM: CT HEAD WITHOUT CONTRAST TECHNIQUE: Contiguous axial images were obtained from the base of the skull through the vertex without intravenous contrast. COMPARISON:  04/08/2017 FINDINGS: Brain: No evidence of acute infarction, hemorrhage, hydrocephalus, extra-axial collection or mass lesion/mass effect. Advanced brain parenchymal volume loss. Mild chronic microvascular white matter disease. Vascular: Vascular calcifications at the skull base. Skull: Normal. Negative for fracture or focal lesion. Sinuses/Orbits: No acute finding. Other: Small frontal scalp hematoma. IMPRESSION: No acute intracranial abnormality. Advanced brain parenchymal atrophy. Mild chronic microvascular white matter disease. Electronically Signed   By: Fidela Salisbury M.D.   On: 06/13/2017 11:40   Dg Hand 2 View  Left  Result Date: 06/21/2017 CLINICAL DATA:  Hand cellulitis EXAM: LEFT HAND - 2 VIEW COMPARISON:  None. FINDINGS: Study is limited by positioning, there is flexed positioning of the digits with overlap of bony structures. No gross fracture is seen. No definitive periostitis or bone destruction. Diffuse soft tissue swelling without soft tissue gas. IMPRESSION: Limited secondary to positioning. No definite acute osseous abnormality. Electronically Signed   By: Donavan Foil M.D.   On: 06/21/2017 00:11   Dg Hand Complete Right  Result Date: 06/20/2017 CLINICAL DATA:  Pain following fall EXAM: RIGHT HAND - COMPLETE 3+ VIEW COMPARISON:  None. FINDINGS: Frontal, oblique, and lateral views were obtained. There is a bandage medially. No other radiopaque foreign body. There is no appreciable fracture or dislocation. There is osteoarthritic change in the first, second, and third MCP joints as well as in the first IP joint. There is also osteoarthritic change in the fifth PIP and all DIP joints. No erosive change. 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 No fracture or dislocation. Bandage medially. Electronically Signed   By: Lowella Grip III M.D.   On: 06/20/2017 19:52    Assessment/Plan  Cellulitis of Left Hand  Cellulitis of Left Upper Extremity  Cervical Dystonia   Continue Augmentin until coarse complete  Continue daily dressing changes with Santyl and wet to dry dressings  Continue Voltaren gel 1%- 2 grams to the posterior neck and shoulders QID for pain  Continue working with OT for strengthening  Palliative Care Consult for evaluation of Hospice eligibility upon discharge  Family/ staff Communication:   Total Time:  Documentation:  Face to Face:  Family/Phone:   Labs/tests ordered:    Medication list reviewed and assessed for continued appropriateness. Monthly medication orders reviewed and signed.  Vikki Ports, NP-C Geriatrics [facility] [phone], [place] [address] Cell Phone (Mon-Fri 8am-5pm):  [phone] On Call:  [phone] & follow prompts after 5pm & weekends Office Phone:  [phone] Office Fax:  [phone]

## 2017-07-06 ENCOUNTER — Other Ambulatory Visit
Admission: RE | Admit: 2017-07-06 | Discharge: 2017-07-06 | Disposition: A | Discharge status: Home / Self Care | Payer: Medicare Other | Source: Ambulatory Visit | Attending: Gerontology | Admitting: Gerontology

## 2017-07-06 ENCOUNTER — Non-Acute Institutional Stay (SKILLED_NURSING_FACILITY): Payer: Medicare Other | Admitting: Gerontology

## 2017-07-06 DIAGNOSIS — R410 Disorientation, unspecified: Secondary | ICD-10-CM | POA: Insufficient documentation

## 2017-07-06 DIAGNOSIS — R4182 Altered mental status, unspecified: Secondary | ICD-10-CM | POA: Insufficient documentation

## 2017-07-06 DIAGNOSIS — R799 Abnormal finding of blood chemistry, unspecified: Secondary | ICD-10-CM | POA: Diagnosis not present

## 2017-07-06 LAB — URINALYSIS, COMPLETE (UACMP) WITH MICROSCOPIC
BACTERIA UA: NONE SEEN
Bilirubin Urine: NEGATIVE
GLUCOSE, UA: NEGATIVE mg/dL
HGB URINE DIPSTICK: NEGATIVE
Ketones, ur: NEGATIVE mg/dL
LEUKOCYTES UA: NEGATIVE
NITRITE: NEGATIVE
Protein, ur: 30 mg/dL — AB
SPECIFIC GRAVITY, URINE: 1.018 (ref 1.005–1.030)
pH: 5 (ref 5.0–8.0)

## 2017-07-06 LAB — CBC
HEMATOCRIT: 36.6 % — AB (ref 40.0–52.0)
Hemoglobin: 12.3 g/dL — ABNORMAL LOW (ref 13.0–18.0)
MCH: 31.3 pg (ref 26.0–34.0)
MCHC: 33.6 g/dL (ref 32.0–36.0)
MCV: 93 fL (ref 80.0–100.0)
Platelets: 367 10*3/uL (ref 150–440)
RBC: 3.93 MIL/uL — ABNORMAL LOW (ref 4.40–5.90)
RDW: 13.8 % (ref 11.5–14.5)
WBC: 9.5 10*3/uL (ref 3.8–10.6)

## 2017-07-07 ENCOUNTER — Encounter: Payer: Self-pay | Admitting: Gerontology

## 2017-07-07 LAB — COMPREHENSIVE METABOLIC PANEL
ALBUMIN: 3.4 g/dL — AB (ref 3.5–5.0)
ALT: <5 U/L — ABNORMAL LOW (ref 17–63)
AST: 18 U/L (ref 15–41)
Alkaline Phosphatase: 65 U/L (ref 38–126)
Anion gap: 9 (ref 5–15)
BILIRUBIN TOTAL: 0.7 mg/dL (ref 0.3–1.2)
BUN: 49 mg/dL — ABNORMAL HIGH (ref 6–20)
CO2: 23 mmol/L (ref 22–32)
Calcium: 8.6 mg/dL — ABNORMAL LOW (ref 8.9–10.3)
Chloride: 107 mmol/L (ref 101–111)
Creatinine, Ser: 1.43 mg/dL — ABNORMAL HIGH (ref 0.61–1.24)
GFR calc Af Amer: 50 mL/min — ABNORMAL LOW (ref >60)
GFR calc non Af Amer: 43 mL/min — ABNORMAL LOW (ref >60)
GLUCOSE: 100 mg/dL — AB (ref 65–99)
POTASSIUM: 4.1 mmol/L (ref 3.5–5.1)
SODIUM: 139 mmol/L (ref 135–145)
TOTAL PROTEIN: 6.3 g/dL — AB (ref 6.5–8.1)

## 2017-07-07 NOTE — Progress Notes (Signed)
Location:   [facility] Room Number: [phone] Place of Service:  SNF [phone]) Provider:  Toni Arthurs, NP-C  [facility]  Patient Care Team: [facility] as PCP - General (Geriatric Medicine)  Extended Emergency Contact Information Primary Emergency Contact: Murray,Wendi Address: [address]          [place], [place] [address] Johnnette Litter of [place] Phone: [phone] Work Phone: [phone] Mobile Phone: [phone] Relation: Daughter Secondary Emergency Contact: Eli Phillips Address: [address]          [place], [place] [address] Johnnette Litter of [place] Phone: [phone] Relation: Daughter  Code Status:  DNR Goals of care: Advanced Directive information Advanced Directives 07/06/2017  Does Patient Have a Medical Advance Directive? Yes  Type of Advance Directive Out of facility DNR (pink MOST or yellow form);Living will;Healthcare Power of Attorney  Does patient want to make changes to medical advance directive? No - Patient declined  Copy of [place] in Chart? Yes  Would patient like information on creating a medical advance directive? -  Pre-existing out of facility DNR order (yellow form or pink MOST form) -     Chief Complaint  Patient presents with  . Acute Visit    Confusion    HPI:  Pt is a 84 y.o. male seen today for an acute visit for acute increased confusion. Pt is typically A&O x 4. Over the weekend, pt had increased confusion with disorientation to person, place, time. Short term memory lapses and increased agitation. He is still eating and drinking well. Voiding regularly and having regular BMs. Pt denies pain. Pt denies n/v/d/f/c/cp/sob/ha/abd pain/dizziness/cough/ congestion. No falls in facility. No head traumas noted. Afebrile. Oxygen saturation acceptable. VSS. No other complaints.     Past Medical History:  Diagnosis Date  . Aneurysm of infrarenal  abdominal aorta (HCC) SUPRAILIAC  4CM PER CT 10-20-2010--  STABLE  . Basal cell carcinoma   . Bladder cancer ([facility]) RECURRENT  (FIRST DX  2001)   HX TURBT AND CHEMO BLADDER INSTILLATION TX'S  . Frequency of urination   . Hyperlipidemia    unspecified  . Hypertension   . Nocturia   . Orthostatic hypotension   . Parkinson disease ([facility])   . Peripheral neuropathy    Past Surgical History:  Procedure Laterality Date  . APPENDECTOMY  10-20-2010  . CYSTO/ BLADDER BX/ FULGERATION BLADDER TUMOR  10-09-2004  . CYSTO/ RETROGRADE PYELOGRAM/ BLADDER BX'S  07-03-2004  . CYSTOSCOPY WITH BIOPSY  11/28/2011   Procedure: CYSTOSCOPY WITH BIOPSY;  Surgeon: Fredricka Bonine, MD;  Location: [facility];  Service: Urology;  Laterality: N/A;  . HIP ARTHROPLASTY Right 04/09/2013   Procedure: ARTHROPLASTY BIPOLAR HIP;  Surgeon: Augustin Schooling, MD;  Location: [facility];  Service: Orthopedics;  Laterality: Right;  . LEFT INGUINAL HERNIA REPAIR  1996  . TONSILLECTOMY AND ADENOIDECTOMY  1938  . TRANSURETHRAL RESECTION OF BLADDER TUMOR  09-11-1999   BLADDER CANCER  . VASECTOMY      No Known Allergies  Allergies as of 07/06/2017   No Known Allergies     Medication List        Accurate as of 07/06/17 11:59 PM. Always use your most recent med list.          acetaminophen 500 MG tablet Commonly known as:  TYLENOL Take 1 tablet (500 mg total) by mouth every 6 (six) hours as needed for mild pain, fever or headache.  amLODipine 10 MG tablet Commonly known as:  NORVASC Take 10 mg by mouth daily.   aspirin EC 81 MG tablet Take 1 tablet (81 mg total) by mouth daily after supper.   atorvastatin 20 MG tablet Commonly known as:  LIPITOR Take 1 tablet (20 mg total) by mouth daily.   carbidopa-levodopa 25-100 MG tablet Commonly known as:  SINEMET IR Take 1.5 tablets by mouth 3 (three) times daily.   diclofenac sodium 1 % Gel Commonly known as:  VOLTAREN Apply 2 g topically 4 (four)  times daily. Massage 2 grams to the back of the neck and shoulders QID for pain, inflammation d/t positioning/ contractures r/t Parkinson's   finasteride 5 MG tablet Commonly known as:  PROSCAR Take 5 mg by mouth every evening.   fluticasone 50 MCG/ACT nasal spray Commonly known as:  FLONASE Place 1 spray into both nostrils 2 (two) times daily as needed (runny nose/ sneezing).   irbesartan 150 MG tablet Commonly known as:  AVAPRO Take 1 tablet (150 mg total) by mouth daily.   metoprolol succinate 25 MG 24 hr tablet Commonly known as:  TOPROL-XL Take 25 mg by mouth daily.   miconazole 2 % cream Commonly known as:  MICOTIN Apply 1 application topically daily. Apply to groin and sacrum   nystatin powder Generic drug:  nystatin Apply 2 g topically 2 (two) times daily as needed (redness/ burning of scrotum).   oxyCODONE 5 MG immediate release tablet Commonly known as:  Oxy IR/ROXICODONE Take 1 tablet (5 mg total) by mouth every 6 (six) hours as needed for moderate pain or severe pain.   rasagiline 1 MG Tabs tablet Commonly known as:  AZILECT Take 1 tablet (1 mg total) by mouth daily.   sennosides-docusate sodium 8.6-50 MG tablet Commonly known as:  SENOKOT-S Take 1 tablet by mouth at bedtime.   SYSTANE 0.4-0.3 % Soln Generic drug:  Polyethyl Glycol-Propyl Glycol Apply 1 application to eye 4 (four) times daily as needed (dry eyes).   tamsulosin 0.4 MG Caps capsule Commonly known as:  FLOMAX Take 1 capsule (0.4 mg total) by mouth daily after supper.   Vitamin D3 2000 units Tabs Take 2,000 Units by mouth daily.       Review of Systems  Unable to perform ROS: Mental status change  Constitutional: Negative for activity change, appetite change, chills, diaphoresis and fever.  HENT: Negative for congestion, mouth sores, nosebleeds, postnasal drip, sneezing, sore throat, trouble swallowing and voice change.   Respiratory: Negative for apnea, cough, choking, chest tightness,  shortness of breath and wheezing.   Cardiovascular: Negative for chest pain, palpitations and leg swelling.  Gastrointestinal: Negative for abdominal distention, abdominal pain, constipation, diarrhea and nausea.  Genitourinary: Negative for difficulty urinating, dysuria, frequency and urgency.  Musculoskeletal: Positive for neck pain. Negative for back pain, gait problem and myalgias. Arthralgias: typical arthritis.  Skin: Positive for wound. Negative for color change, pallor and rash.  Neurological: Positive for weakness. Negative for dizziness, tremors, syncope, speech difficulty, numbness and headaches.  Psychiatric/Behavioral: Positive for confusion. Negative for agitation and behavioral problems.  All other systems reviewed and are negative.   Immunization History  Administered Date(s) Administered  . Pneumococcal Polysaccharide-23 04/14/2013  . Tdap 04/08/2017   Pertinent  Health Maintenance Due  Topic Date Due  . PNA vac Low Risk Adult (2 of 2 - PCV13) 04/14/2014  . INFLUENZA VACCINE  12/10/2016   No flowsheet data found. Functional Status Survey:    Vitals:   07/07/17  1056  BP: (!) 114/44  Pulse: 84  Resp: 20  Temp: (!) 97.5 F (36.4 C)  TempSrc: Oral  SpO2: 96%  Weight: 144 lb 12.8 oz (65.7 kg)  Height: 5' 7"  (1.702 m)   Body mass index is 22.68 kg/m. Physical Exam  Constitutional: Vital signs are normal. He appears well-developed and well-nourished. He is active and cooperative. He does not appear ill. No distress.  HENT:  Head: Normocephalic and atraumatic.  Mouth/Throat: Uvula is midline, oropharynx is clear and moist and mucous membranes are normal. Mucous membranes are not pale, not dry and not cyanotic.  Eyes: Conjunctivae, EOM and lids are normal. Pupils are equal, round, and reactive to light.  Neck: Trachea normal, normal range of motion and full passive range of motion without pain. Neck supple. No JVD present. No tracheal deviation, no edema and no  erythema present. No thyromegaly present.  Cardiovascular: Normal rate, regular rhythm, normal heart sounds, intact distal pulses and normal pulses. Exam reveals no gallop, no distant heart sounds and no friction rub.  No murmur heard. Pulses:      Dorsalis pedis pulses are 2+ on the right side, and 2+ on the left side.  No edema  Pulmonary/Chest: Effort normal and breath sounds normal. No accessory muscle usage. No respiratory distress. He has no decreased breath sounds. He has no wheezes. He has no rhonchi. He has no rales. He exhibits no tenderness.  Abdominal: Soft. Normal appearance and bowel sounds are normal. He exhibits no distension and no ascites. There is no tenderness.  Musculoskeletal: Normal range of motion. He exhibits no edema or tenderness.       Cervical back: He exhibits deformity.  Expected osteoarthritis, stiffness; Bilateral Calves soft, supple. Negative Homan's Sign. B- pedal pulses equal; cervical dystonia  Neurological: He is alert. He is disoriented (x2). He displays atrophy. He exhibits abnormal muscle tone. Coordination and gait abnormal.  Skin: Skin is warm, dry and intact. He is not diaphoretic. No cyanosis. No pallor. Nails show no clubbing.  Psychiatric: He has a normal mood and affect. His speech is normal and behavior is normal. Judgment and thought content normal. Cognition and memory are impaired. He exhibits abnormal recent memory.  Nursing note and vitals reviewed.   Labs reviewed: Recent Labs    06/21/17 0347 06/22/17 0329 07/06/17 2221  NA 140 139 139  K 3.9 3.7 4.1  CL 107 105 107  CO2 23 24 23   GLUCOSE 106* 100* 100*  BUN 30* 24* 49*  CREATININE 0.94 0.90 1.43*  CALCIUM 9.0 8.8* 8.6*   Recent Labs    06/20/17 1729 07/06/17 2221  AST 21 18  ALT <5* <5*  ALKPHOS 59 65  BILITOT 1.0 0.7  PROT 7.3 6.3*  ALBUMIN 3.8 3.4*   Recent Labs    06/13/17 1147 06/20/17 1729 06/21/17 0347 07/06/17 1755  WBC 8.9 11.5* 10.4 9.5  NEUTROABS 6.5  9.3*  --   --   HGB 12.9* 12.3* 11.8* 12.3*  HCT 37.9* 36.8* 34.7* 36.6*  MCV 93.6 92.7 92.9 93.0  PLT 251 255 236 367   Lab Results  Component Value Date   TSH 1.241 03/01/2013   No results found for: HGBA1C No results found for: CHOL, HDL, LDLCALC, LDLDIRECT, TRIG, CHOLHDL  Significant Diagnostic Results in last 30 days:  Dg Ankle Complete Left  Result Date: 06/13/2017 CLINICAL DATA:  Left ankle pain, post unwitnessed fall today. EXAM: LEFT ANKLE COMPLETE - 3+ VIEW COMPARISON:  None. FINDINGS: There  is no evidence of fracture, dislocation, or joint effusion. There is no evidence of arthropathy or other focal bone abnormality. Mild medial soft tissue swelling. IMPRESSION: No acute fracture or dislocation identified about the left ankle. Electronically Signed   By: Fidela Salisbury M.D.   On: 06/13/2017 11:34   Ct Head Wo Contrast  Result Date: 06/13/2017 CLINICAL DATA:  Post unwitnessed fall. EXAM: CT HEAD WITHOUT CONTRAST TECHNIQUE: Contiguous axial images were obtained from the base of the skull through the vertex without intravenous contrast. COMPARISON:  04/08/2017 FINDINGS: Brain: No evidence of acute infarction, hemorrhage, hydrocephalus, extra-axial collection or mass lesion/mass effect. Advanced brain parenchymal volume loss. Mild chronic microvascular white matter disease. Vascular: Vascular calcifications at the skull base. Skull: Normal. Negative for fracture or focal lesion. Sinuses/Orbits: No acute finding. Other: Small frontal scalp hematoma. IMPRESSION: No acute intracranial abnormality. Advanced brain parenchymal atrophy. Mild chronic microvascular white matter disease. Electronically Signed   By: Fidela Salisbury M.D.   On: 06/13/2017 11:40   Dg Hand 2 View Left  Result Date: 06/21/2017 CLINICAL DATA:  Hand cellulitis EXAM: LEFT HAND - 2 VIEW COMPARISON:  None. FINDINGS: Study is limited by positioning, there is flexed positioning of the digits with overlap of bony  structures. No gross fracture is seen. No definitive periostitis or bone destruction. Diffuse soft tissue swelling without soft tissue gas. IMPRESSION: Limited secondary to positioning. No definite acute osseous abnormality. Electronically Signed   By: Donavan Foil M.D.   On: 06/21/2017 00:11   Dg Hand Complete Right  Result Date: 06/20/2017 CLINICAL DATA:  Pain following fall EXAM: RIGHT HAND - COMPLETE 3+ VIEW COMPARISON:  None. FINDINGS: Frontal, oblique, and lateral views were obtained. There is a bandage medially. No other radiopaque foreign body. There is no appreciable fracture or dislocation. There is osteoarthritic change in the first, second, and third MCP joints as well as in the first IP joint. There is also osteoarthritic change in the fifth PIP and all DIP joints. No erosive change. 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 No fracture or dislocation. Bandage medially. Electronically Signed   By: Lowella Grip III M.D.   On: 06/20/2017 19:52    Assessment/Plan  Confusion   Labs  Re-orient frequently  Monitor for safety  Bladder scan prn for concern for urinary retention  F/u on urine culture- treat if UTI indicated  Continue working with OT  Elevated BUN (with Creatinine)   Insert and maintain peripheral IV for infusion of fluids  0.9% NS- give 250 mL bolus over 1 hour, then continuous at 50 mL/ hr x 24 hours for gentle hydration  Re-check labs in am  Family/ staff Communication:   Total Time:  Documentation:  Face to Face:  Family/Phone:   Labs/tests ordered:  Cbc, met c, ua, C&S  Medication list reviewed and assessed for continued appropriateness.  Vikki Ports, NP-C Geriatrics [facility] [phone], [place] [address] Cell Phone (Mon-Fri 8am-5pm):  [phone] On Call:  [phone] & follow prompts after 5pm & weekends Office Phone:  [phone] Office Fax:   [phone]

## 2017-07-08 ENCOUNTER — Other Ambulatory Visit
Admission: RE | Admit: 2017-07-08 | Discharge: 2017-07-08 | Disposition: A | Discharge status: Home / Self Care | Payer: Medicare Other | Source: Ambulatory Visit | Attending: Gerontology | Admitting: Gerontology

## 2017-07-08 DIAGNOSIS — R4182 Altered mental status, unspecified: Secondary | ICD-10-CM | POA: Insufficient documentation

## 2017-07-08 LAB — BASIC METABOLIC PANEL
Anion gap: 8 (ref 5–15)
BUN: 37 mg/dL — AB (ref 6–20)
CO2: 25 mmol/L (ref 22–32)
CREATININE: 1 mg/dL (ref 0.61–1.24)
Calcium: 8.8 mg/dL — ABNORMAL LOW (ref 8.9–10.3)
Chloride: 107 mmol/L (ref 101–111)
Glucose, Bld: 82 mg/dL (ref 65–99)
Potassium: 4.3 mmol/L (ref 3.5–5.1)
SODIUM: 140 mmol/L (ref 135–145)

## 2017-07-08 LAB — CBC WITH DIFFERENTIAL/PLATELET
BASOS PCT: 1 %
Basophils Absolute: 0.1 10*3/uL (ref 0–0.1)
EOS ABS: 0.2 10*3/uL (ref 0–0.7)
EOS PCT: 3 %
HCT: 36.5 % — ABNORMAL LOW (ref 40.0–52.0)
Hemoglobin: 12.1 g/dL — ABNORMAL LOW (ref 13.0–18.0)
LYMPHS ABS: 2.3 10*3/uL (ref 1.0–3.6)
Lymphocytes Relative: 26 %
MCH: 30.7 pg (ref 26.0–34.0)
MCHC: 33.1 g/dL (ref 32.0–36.0)
MCV: 92.8 fL (ref 80.0–100.0)
Monocytes Absolute: 0.6 10*3/uL (ref 0.2–1.0)
Monocytes Relative: 7 %
Neutro Abs: 5.4 10*3/uL (ref 1.4–6.5)
Neutrophils Relative %: 63 %
PLATELETS: 338 10*3/uL (ref 150–440)
RBC: 3.93 MIL/uL — ABNORMAL LOW (ref 4.40–5.90)
RDW: 13.7 % (ref 11.5–14.5)
WBC: 8.6 10*3/uL (ref 3.8–10.6)

## 2017-07-08 LAB — URINE CULTURE

## 2017-07-09 ENCOUNTER — Other Ambulatory Visit
Admission: RE | Admit: 2017-07-09 | Discharge: 2017-07-09 | Disposition: A | Discharge status: Home / Self Care | Payer: Medicare Other | Source: Ambulatory Visit | Attending: Internal Medicine | Admitting: Internal Medicine

## 2017-07-09 DIAGNOSIS — G629 Polyneuropathy, unspecified: Secondary | ICD-10-CM | POA: Diagnosis not present

## 2017-07-09 DIAGNOSIS — L03114 Cellulitis of left upper limb: Secondary | ICD-10-CM | POA: Diagnosis not present

## 2017-07-09 DIAGNOSIS — I10 Essential (primary) hypertension: Secondary | ICD-10-CM | POA: Diagnosis not present

## 2017-07-09 DIAGNOSIS — M6281 Muscle weakness (generalized): Secondary | ICD-10-CM | POA: Diagnosis not present

## 2017-07-09 DIAGNOSIS — S61412D Laceration without foreign body of left hand, subsequent encounter: Secondary | ICD-10-CM | POA: Diagnosis not present

## 2017-07-09 DIAGNOSIS — E785 Hyperlipidemia, unspecified: Secondary | ICD-10-CM | POA: Diagnosis not present

## 2017-07-09 DIAGNOSIS — R296 Repeated falls: Secondary | ICD-10-CM | POA: Diagnosis not present

## 2017-07-09 DIAGNOSIS — G243 Spasmodic torticollis: Secondary | ICD-10-CM | POA: Diagnosis not present

## 2017-07-09 DIAGNOSIS — Z7982 Long term (current) use of aspirin: Secondary | ICD-10-CM | POA: Diagnosis not present

## 2017-07-09 DIAGNOSIS — G903 Multi-system degeneration of the autonomic nervous system: Secondary | ICD-10-CM | POA: Diagnosis not present

## 2017-07-09 DIAGNOSIS — R410 Disorientation, unspecified: Secondary | ICD-10-CM | POA: Diagnosis present

## 2017-07-09 DIAGNOSIS — N400 Enlarged prostate without lower urinary tract symptoms: Secondary | ICD-10-CM | POA: Diagnosis not present

## 2017-07-09 LAB — URINALYSIS, COMPLETE (UACMP) WITH MICROSCOPIC
Bacteria, UA: NONE SEEN
Bilirubin Urine: NEGATIVE
GLUCOSE, UA: NEGATIVE mg/dL
HGB URINE DIPSTICK: NEGATIVE
Ketones, ur: NEGATIVE mg/dL
Leukocytes, UA: NEGATIVE
NITRITE: NEGATIVE
PROTEIN: NEGATIVE mg/dL
SPECIFIC GRAVITY, URINE: 1.015 (ref 1.005–1.030)
pH: 5 (ref 5.0–8.0)

## 2017-07-10 ENCOUNTER — Encounter
Admission: RE | Admit: 2017-07-10 | Discharge: 2017-07-10 | Disposition: A | Discharge status: Home / Self Care | Payer: Medicare Other | Source: Ambulatory Visit | Attending: Internal Medicine | Admitting: Internal Medicine

## 2017-07-16 ENCOUNTER — Non-Acute Institutional Stay (SKILLED_NURSING_FACILITY): Payer: Medicare Other | Admitting: Gerontology

## 2017-07-16 ENCOUNTER — Encounter: Payer: Self-pay | Admitting: Gerontology

## 2017-07-16 DIAGNOSIS — G243 Spasmodic torticollis: Secondary | ICD-10-CM

## 2017-07-16 DIAGNOSIS — L03114 Cellulitis of left upper limb: Secondary | ICD-10-CM

## 2017-07-16 NOTE — Progress Notes (Signed)
Location:   [facility] Room Number: [phone] Place of Service:  SNF [phone])  Provider: Toni Arthurs, NP-C  PCP: [facility] Patient Care Team: [facility] as PCP - General (Geriatric Medicine)  Extended Emergency Contact Information Primary Emergency Contact: Murray,Wendi Address: [address]          [place], [place] [address] Johnnette Litter of [place] Phone: [phone] Work Phone: [phone] Mobile Phone: [phone] Relation: Daughter Secondary Emergency Contact: Eli Phillips Address: [address]          [place], [place] [address] Johnnette Litter of [place] Phone: [phone] Relation: Daughter  Code Status: DNR Goals of care:  Advanced Directive information Advanced Directives 07/16/2017  Does Patient Have a Medical Advance Directive? Yes  Type of Paramedic of [place];Out of facility DNR (pink MOST or yellow form);Living will  Does patient want to make changes to medical advance directive? No - Patient declined  Copy of [place] in Chart? Yes  Would patient like information on creating a medical advance directive? -  Pre-existing out of facility DNR order (yellow form or pink MOST form) -     No Known Allergies  Chief Complaint  Patient presents with  . Discharge Note    Discharged from SNF    HPI:  84 y.o. male seen today for discharge evaluation. Pt was admitted to the facility for rehab/ wound management related to lacerations of the hands with cellulitis. Pt completed coarse of po antibiotics and has been receiving wound care/ dressing changes. Cellulitis has resolved. Pt been working with OT for improved wheelchair mobility. Pt has a h/o Parkinsons and has cervical dystonia. Neck is in a constant flexed position, causing pain at times. Pt was started on Voltaren gel to the neck with favorable results. Pt reports his pain is well controlled on  current regimen. Pt was assessed by Palliative Care while on rehab and assessed for Hospice appropriateness. Pt is scheduled to discharge today to ALF with Hospice services. Pt reports he is feeling better and is ready for discharge. VSS. No other complaints.     Past Medical History:  Diagnosis Date  . Aneurysm of infrarenal abdominal aorta (HCC) SUPRAILIAC  4CM PER CT 10-20-2010--  STABLE  . Basal cell carcinoma   . Bladder cancer ([place]) RECURRENT  (FIRST DX  2001)   HX TURBT AND CHEMO BLADDER INSTILLATION TX'S  . Frequency of urination   . Hyperlipidemia    unspecified  . Hypertension   . Nocturia   . Orthostatic hypotension   . Parkinson disease ([place])   . Peripheral neuropathy     Past Surgical History:  Procedure Laterality Date  . APPENDECTOMY  10-20-2010  . CYSTO/ BLADDER BX/ FULGERATION BLADDER TUMOR  10-09-2004  . CYSTO/ RETROGRADE PYELOGRAM/ BLADDER BX'S  07-03-2004  . CYSTOSCOPY WITH BIOPSY  11/28/2011   Procedure: CYSTOSCOPY WITH BIOPSY;  Surgeon: Fredricka Bonine, MD;  Location: [facility];  Service: Urology;  Laterality: N/A;  . HIP ARTHROPLASTY Right 04/09/2013   Procedure: ARTHROPLASTY BIPOLAR HIP;  Surgeon: [place]in Schooling, MD;  Location: [facility];  Service: Orthopedics;  Laterality: Right;  . LEFT INGUINAL HERNIA REPAIR  1996  . TONSILLECTOMY AND ADENOIDECTOMY  1938  . TRANSURETHRAL RESECTION OF BLADDER TUMOR  09-11-1999   BLADDER CANCER  . VASECTOMY        reports that he quit smoking about 18 years ago. His smoking use included cigarettes.  He has a 100.00 pack-year smoking history. he has never used smokeless tobacco. He reports that he does not drink alcohol or use drugs. Social History   Socioeconomic History  . Marital status: Widowed    Spouse name: Katharine Look  . Number of children: 4  . Years of education: college  . Highest education level: Not on file  Social Needs  . Financial resource strain: Not on file  . Food insecurity -  worry: Not on file  . Food insecurity - inability: Not on file  . Transportation needs - medical: Not on file  . Transportation needs - non-medical: Not on file  Occupational History    Comment: retired  Tobacco Use  . Smoking status: Former Smoker    Packs/day: 2.00    Years: 50.00    Pack years: 100.00    Types: Cigarettes    Last attempt to quit: 05/13/1999    Years since quitting: 18.1  . Smokeless tobacco: Never Used  Substance and Sexual Activity  . Alcohol use: No    Alcohol/week: 0.0 oz  . Drug use: No  . Sexual activity: Not on file  Other Topics Concern  . Not on file  Social History Narrative   Patient lives at [facility] in [place], [place]   Education- college education.   Retired.   Right handed.   Caffeine- Very Little   Functional Status Survey:    No Known Allergies  Pertinent  Health Maintenance Due  Topic Date Due  . PNA vac Low Risk Adult (2 of 2 - PCV13) 04/14/2014  . INFLUENZA VACCINE  12/10/2016    Medications: Allergies as of 07/16/2017   No Known Allergies     Medication List        Accurate as of 07/16/17 12:02 PM. Always use your most recent med list.          acetaminophen 500 MG tablet Commonly known as:  TYLENOL Take 1 tablet (500 mg total) by mouth every 6 (six) hours as needed for mild pain, fever or headache.   amLODipine 10 MG tablet Commonly known as:  NORVASC Take 10 mg by mouth daily.   aspirin EC 81 MG tablet Take 1 tablet (81 mg total) by mouth daily after supper.   atorvastatin 20 MG tablet Commonly known as:  LIPITOR Take 1 tablet (20 mg total) by mouth daily.   carbidopa-levodopa 25-100 MG tablet Commonly known as:  SINEMET IR Take 1.5 tablets by mouth 3 (three) times daily.   diclofenac sodium 1 % Gel Commonly known as:  VOLTAREN Apply 2 g topically 4 (four) times daily. Massage 2 grams to the back of the neck and shoulders QID for pain, inflammation d/t positioning/ contractures r/t Parkinson's     finasteride 5 MG tablet Commonly known as:  PROSCAR Take 5 mg by mouth every evening.   fluticasone 50 MCG/ACT nasal spray Commonly known as:  FLONASE Place 1 spray into both nostrils 2 (two) times daily as needed (runny nose/ sneezing).   irbesartan 150 MG tablet Commonly known as:  AVAPRO Take 1 tablet (150 mg total) by mouth daily.   metoprolol succinate 25 MG 24 hr tablet Commonly known as:  TOPROL-XL Take 25 mg by mouth daily.   miconazole 2 % cream Commonly known as:  MICOTIN Apply 1 application topically daily. Apply to groin and sacrum   nystatin powder Generic drug:  nystatin Apply 2 g topically 2 (two) times daily as needed (redness/ burning of scrotum).  oxyCODONE 5 MG immediate release tablet Commonly known as:  Oxy IR/ROXICODONE Take 1 tablet (5 mg total) by mouth every 6 (six) hours as needed for moderate pain or severe pain.   rasagiline 1 MG Tabs tablet Commonly known as:  AZILECT Take 1 tablet (1 mg total) by mouth daily.   sennosides-docusate sodium 8.6-50 MG tablet Commonly known as:  SENOKOT-S Take 1 tablet by mouth at bedtime.   SYSTANE 0.4-0.3 % Soln Generic drug:  Polyethyl Glycol-Propyl Glycol Apply 1 application to eye 4 (four) times daily as needed (dry eyes).   tamsulosin 0.4 MG Caps capsule Commonly known as:  FLOMAX Take 1 capsule (0.4 mg total) by mouth daily after supper.   Vitamin D3 2000 units Tabs Take 2,000 Units by mouth daily.       Review of Systems  Unable to perform ROS: Other  Constitutional: Negative for activity change, appetite change, chills, diaphoresis and fever.  HENT: Negative for congestion, mouth sores, nosebleeds, postnasal drip, sneezing, sore throat, trouble swallowing and voice change.   Respiratory: Negative for apnea, cough, choking, chest tightness, shortness of breath and wheezing.   Cardiovascular: Negative for chest pain, palpitations and leg swelling.  Gastrointestinal: Negative for abdominal  distention, abdominal pain, constipation, diarrhea and nausea.  Genitourinary: Negative for difficulty urinating, dysuria, frequency and urgency.  Musculoskeletal: Positive for arthralgias (typical arthritis), gait problem, myalgias, neck pain and neck stiffness. Negative for back pain.  Skin: Positive for wound. Negative for color change, pallor and rash.  Neurological: Positive for weakness. Negative for dizziness, tremors, syncope, speech difficulty, numbness and headaches.  Psychiatric/Behavioral: Negative for agitation and behavioral problems.  All other systems reviewed and are negative.   Vitals:   07/16/17 1138  BP: (!) 126/52  Pulse: 63  Resp: 16  Temp: 97.8 F (36.6 C)  TempSrc: Oral  SpO2: 94%  Weight: 149 lb 9.6 oz (67.9 kg)  Height: 5\' 7"  (1.702 m)   Body mass index is 23.43 kg/m. Physical Exam  Constitutional: He is oriented to person, place, and time. Vital signs are normal. He appears well-developed and well-nourished. He is active and cooperative. He does not appear ill. No distress.  HENT:  Head: Normocephalic and atraumatic.  Mouth/Throat: Uvula is midline, oropharynx is clear and moist and mucous membranes are normal. Mucous membranes are not pale, not dry and not cyanotic.  Eyes: Pupils are equal, round, and reactive to light. Conjunctivae, EOM and lids are normal.  Neck: Trachea normal, normal range of motion and full passive range of motion without pain. Neck supple. No JVD present. No tracheal deviation, no edema and no erythema present. No thyromegaly present.  Cardiovascular: Normal rate, regular rhythm, normal heart sounds, intact distal pulses and normal pulses. Exam reveals no gallop, no distant heart sounds and no friction rub.  No murmur heard. Pulses:      Dorsalis pedis pulses are 2+ on the right side, and 2+ on the left side.  No edema  Pulmonary/Chest: Effort normal and breath sounds normal. No accessory muscle usage. No respiratory distress. He  has no decreased breath sounds. He has no wheezes. He has no rhonchi. He has no rales. He exhibits no tenderness.  Abdominal: Soft. Normal appearance and bowel sounds are normal. He exhibits no distension and no ascites. There is no tenderness.  Musculoskeletal: He exhibits no edema or tenderness.       Cervical back: He exhibits decreased range of motion, deformity, laceration and spasm.  Expected osteoarthritis, stiffness; Bilateral Calves  soft, supple. Negative Homan's Sign. B- pedal pulses equal; generalized weakness, mibile with rolling walker  Neurological: He is alert and oriented to person, place, and time. He has normal strength. He displays atrophy and tremor. He exhibits abnormal muscle tone. Coordination and gait abnormal.  Skin: Skin is warm and dry. Laceration (B-hands. Daily dressing changes) noted. He is not diaphoretic. No cyanosis. No pallor. Nails show no clubbing.  Psychiatric: He has a normal mood and affect. His speech is normal and behavior is normal. Judgment and thought content normal. Cognition and memory are normal.  Nursing note and vitals reviewed.   Labs reviewed: Basic Metabolic Panel: Recent Labs    06/22/17 0329 07/06/17 2221 07/08/17 0445  NA 139 139 140  K 3.7 4.1 4.3  CL 105 107 107  CO2 24 23 25   GLUCOSE 100* 100* 82  BUN 24* 49* 37*  CREATININE 0.90 1.43* 1.00  CALCIUM 8.8* 8.6* 8.8*   Liver Function Tests: Recent Labs    06/20/17 1729 07/06/17 2221  AST 21 18  ALT <5* <5*  ALKPHOS 59 65  BILITOT 1.0 0.7  PROT 7.3 6.3*  ALBUMIN 3.8 3.4*   No results for input(s): LIPASE, AMYLASE in the last 8760 hours. No results for input(s): AMMONIA in the last 8760 hours. CBC: Recent Labs    06/13/17 1147 06/20/17 1729 06/21/17 0347 07/06/17 1755 07/08/17 0445  WBC 8.9 11.5* 10.4 9.5 8.6  NEUTROABS 6.5 9.3*  --   --  5.4  HGB 12.9* 12.3* 11.8* 12.3* 12.1*  HCT 37.9* 36.8* 34.7* 36.6* 36.5*  MCV 93.6 92.7 92.9 93.0 92.8  PLT 251 255 236 367  338   Cardiac Enzymes: Recent Labs    06/13/17 1147  TROPONINI <0.03   BNP: Invalid input(s): POCBNP CBG: No results for input(s): GLUCAP in the last 8760 hours.  Procedures and Imaging Studies During Stay: Dg Hand 2 View Left  Result Date: 06/21/2017 CLINICAL DATA:  Hand cellulitis EXAM: LEFT HAND - 2 VIEW COMPARISON:  None. FINDINGS: Study is limited by positioning, there is flexed positioning of the digits with overlap of bony structures. No gross fracture is seen. No definitive periostitis or bone destruction. Diffuse soft tissue swelling without soft tissue gas. IMPRESSION: Limited secondary to positioning. No definite acute osseous abnormality. Electronically Signed   By: Donavan Foil M.D.   On: 06/21/2017 00:11   Dg Hand Complete Right  Result Date: 06/20/2017 CLINICAL DATA:  Pain following fall EXAM: RIGHT HAND - COMPLETE 3+ VIEW COMPARISON:  None. FINDINGS: Frontal, oblique, and lateral views were obtained. There is a bandage medially. No other radiopaque foreign body. There is no appreciable fracture or dislocation. There is osteoarthritic change in the first, second, and third MCP joints as well as in the first IP joint. There is also osteoarthritic change in the fifth PIP and all DIP joints. No erosive change. 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 No fracture or dislocation. Bandage medially. Electronically Signed   By: Lowella Grip III M.D.   On: 06/20/2017 19:52    Assessment/Plan:    Cellulitis of left hand  Resolved  Continue dressing changes as instructed   Follow up with PCP asap after discharge for continuity of care  Cervical dystonia  Voltaren 1% Gel 2 grams QID to the neck for pain  Continue Oxycodone 5 mg 1 tablet po Q 6 hours prn pain   Patient is being discharged with the following home health services: Hospice  Patient is being discharged  with the following durable medical equipment:  Hospital bed  Patient has been  advised to f/u with their PCP in 1-2 weeks to bring them up to date on their rehab stay.  Social services at facility was responsible for arranging this appointment.  Pt was provided with a 30 day supply of prescriptions for medications and refills must be obtained from their PCP.  For controlled substances, a more limited supply may be provided adequate until PCP appointment only.  Future labs/tests needed:    Family/ staff Communication:   Total Time:  Documentation:  Face to Face:  Family/Phone:  Vikki Ports, NP-C [facility]iatric[facility] [address], [place] [address] Cell Phone (Mon-Fri 8am-5pm):  [phone] On Call:  [phone] & follow prompts after 5pm & weekends Office Phone:  [phone] Office Fax:  [phone]

## 2017-09-07 NOTE — Progress Notes (Signed)
[facility]  PATIENT: Ivan Clark DOB: [birth year]   REASON FOR VISIT: Follow-up for Parkinson's disease, cervical dystonia  HISTORY FROM: Patient and daughter    HISTORY OF PRESENT ILLNESS:YYHarold B Clark is 84 years old male, accompanied by his wife, initially seen in consultation by his primary care physician [name] for cervical dystonia, mild parkinsonian features. He has past medical history of bladder cancer, hypertension, prostate hypertrophy, over the past 20 years, he also developed gradual onset of anterocollois. Since 2012, wife noticed that he has slowed down significantly, had difficulty initiate gait, small shuffling gait, occasionally bilateral hands tremor, he also developed frequent lightheadedness, happen when he gets up quickly from sitting position, no passing out, consistent with a orthostatic blood pressure changes. He also has chronic constipation, acting out of dreams, loss of sense of smell for a long time period. He contributed to his loss of smell to his long time smoking history, he quit smoking 11 years ago, he used to smoke 2 packs a day. MRI of brain in August 2011: mild atrophy,small vessel disease, no acute lesions. MRA of brain was normal On examination he has mild parkinsonian features, Sinemet 25/100 3 times a day since 2012, he tolerated the medication well, there is mild improvement in his gait, he underwent emergency appendectomy in June 2013, recovered well from his surgery. He denies memory trouble. Selegiline since 04/2012, did very well, but had few episodes of dizziness,   Fainting spell in March 5th 2014, he got up early morning, around 5:45 AM, he was in bathroom, he felt funny after taking few steps, landed on the floor, he was taken to [name], was found to have orthostatic hypotension, was sent to the emergency room, reported normal CT scan. He continues to complain of lightheadedness when standing up  quickly,  He began to receive EMG guided butulinum toxin injection for his anterocollis and sialorrhea since March 19th 2014, the first one was in March 2014, the second injection was in July 2nd 2014, each time, he has mild improvement in straighting up his neck better, so he can walk better.  The third injection was October 10th 2014 , the same amount of xeomin 100 units total, 50 units to sternocleidal mastoid muscles, 5 days after the injection, he began to notice difficulty swallowing, especially with liquid, and hard solid food, eventually was admitted to hospital in February 28, 2013 swallowing study has demonstrated moderate pharyngeal and cervical esophageal dysphagia. He require NG tube for one day, dysphagia gradually resolved  He fell in April 09, 2013, with right hip fracture, he had a right hemiarthroplasty, recovered very well, is ambulating without assistance again, He is taking Sinemet IR 25/100 3 times a day, Sinemet CR 50/200 one tablet every night. He has no significant memory trouble, mild REM sleep disorder    UPDATE June 8th 2016:YY I have reviewed consult note from movement specialist [name] in March 2016, who concurred the diagnosis of mild Parkinson's disease, cervical dystonia, agree on current management of Sinemet immediate release 25/100 mg 3 times a day, Sinemet CR 50/200 mg once every night He has mild trouble swallowing, with big chunk solid food, he is still able to eat regular food, acting out of dreams, worsening anterocollis, still able to ambulate without assistance  His wife is using 24-hour oxygen now because of bronchiectasis   UPDATE 12/08/2016[name], 84 year old male returns for follow-up. He has a history of mild Parkinson's disease and cervical  dystonia and mild dysphagia. He can chock on certain foods such as grapes and he tries to avoid those. He has no problems with liquids. He denies any recent falls, he uses a cane for  ambulation. He does some stretching exercises. He does not perform any neck exercises. He reports that his memory is good, he is currently on Sinemet extended release at night and regular release 3 times a day before meals, he is also on Azilect. He returns for reevaluation. UPDATE 6/7/17CM  Ivan Clark 84 year old male returns for follow-up. He has a history of Parkinson's disease and cervical dystonia and mild dysphagia. Since last seen 6 months ago he has several falls with MRI from orthopedist in [place]  (epidural steroid 09-06-15 spinal nerve root block 2 wks ago5-23-17))  he has moved from his private home to living at [facility] in [place], [place].  Has [facility] orders to be signed for a hospital bed in a wheelchair. He reports that his appetite is better and he is chocking less. He has been set up for some physical therapy but that has not started. He reports no problems with his memory. He remains on Sinemet and Azilect. Minimal tremor seen today. He returns for reevaluation   UPDATE 17-May-2023 2017:YY His wife passed away in 2015-06-06,  He lives is [facility] living, he is doing well.  He is going physical therapy.  His legs feel like magnet, now walking with a walker.  He is taking Sinemet 25/100 3 times a day, carbidopa levodopa ER 50/200 one tablet at bedtime, Azilect 1 mg daily, he was noted to have tongue protrusion, mild dyskinesia He sleeps on his back, sleeps well.  UPDATE Jul 09 2016:YY He fell 3 times since June 05, 2016, most recent was on July 07 2016, he finished using bathroom, standing up washing his hand, without clear triggers, he fell backwards, landed on his back, now with right side chest pain, and sternal pain upon deep breathing, He is taking Sinemet 25/100 mg one and half tablets 3 times a day, Azilect 1 mg daily, he tolerates the medication well, Today he was found to have irregular heart rhythm,  UPDATE Sep 10 2016:YY I was able to review his cardiology visit with Dr.  Wende Bushy on July 16 2016, poor progression of R-wave on EKG, he does have risk factor of coronary artery disease including advanced age, hypertension, hyperlipidemia, proceed with stress testing, optimize blood pressure control, He fell on August 15 2016, he fell backward, no LOC, today's blood pressure was noted to be elevated, he has missed his morning dose of medications UPDATE 10/29/2018[name] 84 year old male returns for follow-up with his daughter. He has a history of  Parkinson's disease and cervical dystonia. He also has gait abnormality and has had 2 falls since last seen. He fell most recently in October and fractured his clavicle. He currently resides in assisted living facility. He requires assistance with activities of daily living except feeding himself. He goes to chair exercises 30 minutes 5 days a week. He ambulates with a rolling walker. Appetite is good and he is sleeping well. He remains on Azilect and Sinemet without side effects. He denies any hallucinations or confusion. Blood pressure elevated in the office today however he claims he has not taken his morning meds. He returns for reevaluation UPDATE 4/30/2019[name], 61 male returns for follow-up with history of Parkinson's disease and cervical dystonia.  Since last seen he has had multiple falls, at least 7.  He is in a wheelchair now and does not ambulate unless he has assistance.  He was admitted to [facility] for rehab in February.  He is back at his assisted living and hospice is now involved.  He remains on Sinemet and Azilect without side effects.  He requires assistance with all activities of daily living, memory is good.  He denies hallucinating.  Appetite is good and he is sleeping well at night.  He returns for reevaluation REVIEW OF SYSTEMS: Full 14 system review of systems performed and notable only for those listed, all others are neg:  Constitutional: neg  Cardiovascular: neg Ear/Nose/Throat:  neg  Skin: neg Eyes: neg Respiratory: neg Gastroitestinal: neg  Hematology/Lymphatic: neg  Endocrine: neg Musculoskeletal: Gait abnormality, multiple falls Allergy/Immunology: neg Neurological: Cervical dystonia, Parkinson's disease Psychiatric: neg Sleep : neg   ALLERGIES: No Known Allergies  HOME MEDICATIONS: Outpatient Medications Prior to Visit  Medication Sig Dispense Refill  . acetaminophen (TYLENOL) 500 MG tablet Take 1 tablet (500 mg total) by mouth every 6 (six) hours as needed for mild pain, fever or headache. 30 tablet 0  . amLODipine (NORVASC) 10 MG tablet Take 10 mg by mouth daily.   2  . aspirin EC 81 MG tablet Take 1 tablet (81 mg total) by mouth daily after supper.    [name] atorvastatin (LIPITOR) 20 MG tablet Take 1 tablet (20 mg total) by mouth daily. 30 tablet 1  . carbidopa-levodopa (SINEMET IR) 25-100 MG tablet Take 1.5 tablets by mouth 3 (three) times daily.    . Cholecalciferol (VITAMIN D3) 2000 units TABS Take 2,000 Units by mouth daily.    . diclofenac sodium (VOLTAREN) 1 % GEL Apply 2 g topically 4 (four) times daily. Massage 2 grams to the back of the neck and shoulders QID for pain, inflammation d/t positioning/ contractures r/t Parkinson's    . finasteride (PROSCAR) 5 MG tablet Take 5 mg by mouth every evening.   0  . fluticasone (FLONASE) 50 MCG/ACT nasal spray Place 1 spray into both nostrils 2 (two) times daily as needed (runny nose/ sneezing).   0  . irbesartan (AVAPRO) 150 MG tablet Take 1 tablet (150 mg total) by mouth daily. 30 tablet 2  . metoprolol succinate (TOPROL-XL) 25 MG 24 hr tablet Take 25 mg by mouth daily.    . miconazole (MICOTIN) 2 % cream Apply 1 application topically daily. Apply to groin and sacrum    . naproxen sodium (ALEVE) 220 MG tablet Take 220 mg by mouth daily as needed.    . nystatin (NYSTATIN) powder Apply 2 g topically 2 (two) times daily as needed (redness/ burning of scrotum).    Vladimir Faster Glycol-Propyl Glycol (SYSTANE)  0.4-0.3 % SOLN Apply 1 application to eye 4 (four) times daily as needed (dry eyes).     . rasagiline (AZILECT) 1 MG TABS tablet Take 1 tablet (1 mg total) by mouth daily. 90 tablet 2  . sennosides-docusate sodium (SENOKOT-S) 8.6-50 MG tablet Take 1 tablet by mouth at bedtime.     . tamsulosin (FLOMAX) 0.4 MG CAPS capsule Take 1 capsule (0.4 mg total) by mouth daily after supper. 30 capsule 1  . oxyCODONE (OXY IR/ROXICODONE) 5 MG immediate release tablet Take 1 tablet (5 mg total) by mouth every 6 (six) hours as needed for moderate pain or severe pain. (Patient not taking: Reported on 09/08/2017) 120 tablet 0   Facility-Administered Medications Prior to Visit  Medication Dose Route Frequency Provider Last Rate Last Dose  .  mitomycin (MUTAMYCIN) chemo injection 40 mg  40 mg Bladder Instillation Once Festus Aloe, MD        PAST MEDICAL HISTORY: Past Medical History:  Diagnosis Date  . Aneurysm of infrarenal abdominal aorta (HCC) SUPRAILIAC  4CM PER CT 10-20-2010--  STABLE  . Basal cell carcinoma   . Bladder cancer ([place]) RECURRENT  (FIRST DX  2001)   HX TURBT AND CHEMO BLADDER INSTILLATION TX'S  . Frequency of urination   . Hyperlipidemia    unspecified  . Hypertension   . Nocturia   . Orthostatic hypotension   . Parkinson disease ([place])   . Peripheral neuropathy     PAST SURGICAL HISTORY: Past Surgical History:  Procedure Laterality Date  . APPENDECTOMY  10-20-2010  . CYSTO/ BLADDER BX/ FULGERATION BLADDER TUMOR  10-09-2004  . CYSTO/ RETROGRADE PYELOGRAM/ BLADDER BX'S  07-03-2004  . CYSTOSCOPY WITH BIOPSY  11/28/2011   Procedure: CYSTOSCOPY WITH BIOPSY;  Surgeon: Fredricka Bonine, MD;  Location: [facility];  Service: Urology;  Laterality: N/A;  . HIP ARTHROPLASTY Right 04/09/2013   Procedure: ARTHROPLASTY BIPOLAR HIP;  Surgeon: Augustin Schooling, MD;  Location: [facility];  Service: Orthopedics;  Laterality: Right;  . LEFT INGUINAL HERNIA REPAIR  1996  .  TONSILLECTOMY AND ADENOIDECTOMY  1938  . TRANSURETHRAL RESECTION OF BLADDER TUMOR  09-11-1999   BLADDER CANCER  . VASECTOMY      FAMILY HISTORY: Family History  Problem Relation Age of Onset  . Alzheimer's disease Mother   . Heart failure Father   . Kidney cancer Neg Hx   . Prostate cancer Neg Hx   . Basal cell carcinoma Neg Hx   . Melanoma Neg Hx   . Squamous cell carcinoma Neg Hx     SOCIAL HISTORY: Social History   Socioeconomic History  . Marital status: Widowed    Spouse name: Katharine Look  . Number of children: 4  . Years of education: college  . Highest education level: Not on file  Occupational History    Comment: retired  Scientific laboratory technician  . Financial resource strain: Not on file  . Food insecurity:    Worry: Not on file    Inability: Not on file  . Transportation needs:    Medical: Not on file    Non-medical: Not on file  Tobacco Use  . Smoking status: Former Smoker    Packs/day: 2.00    Years: 50.00    Pack years: 100.00    Types: Cigarettes    Last attempt to quit: 05/13/1999    Years since quitting: 18.3  . Smokeless tobacco: Never Used  Substance and Sexual Activity  . Alcohol use: No    Alcohol/week: 0.0 oz  . Drug use: No  . Sexual activity: Not on file  Lifestyle  . Physical activity:    Days per week: Not on file    Minutes per session: Not on file  . Stress: Not on file  Relationships  . Social connections:    Talks on phone: Not on file    Gets together: Not on file    Attends religious service: Not on file    Active member of club or organization: Not on file    Attends meetings of clubs or organizations: Not on file    Relationship status: Not on file  . Intimate partner violence:    Fear of current or ex partner: Not on file    Emotionally abused: Not on file    Physically  abused: Not on file    Forced sexual activity: Not on file  Other Topics Concern  . Not on file  Social History Narrative   Patient lives at [facility] in  [place], [place]   Education- college education.   Retired.   Right handed.   Caffeine- Very Little     PHYSICAL EXAM  Vitals:   09/08/17 0804  BP: (!) 104/59  Pulse: 64  Weight: 153 lb 12.8 oz (69.8 kg)  Height: 5\' 7"  (1.702 m)   Body mass index is 24.09 kg/m.  Generalized: Well developed, in no acute distress  Head: normocephalic and atraumatic,. Oropharynx benign  Neck: Cervical dystonia,  Musculoskeletal: No deformity  Skin numerous bruises on forearms Neurological examination   Mentation: Alert oriented to time, place, history taking. Attention span and concentration appropriate. Recent and remote memory intact.  Follows all commands speech and language fluent.   Cranial nerve II-XII: Pupils were equal round reactive to light extraocular movements were full, visual field were full on confrontational test. Facial sensation and strength were normal. hearing was intact to finger rubbing bilaterally. Uvula tongue midline. head turning and shoulder shrug were normal and symmetric.Tongue protrusion into cheek strength was normal. Motor: normal bulk and tone, full strength in the BUE, BLE, mild bilateral upper extremity rigidity right worse than left , severe anterocollis , neck not able to pass erect position  Sensory: normal and symmetric to light touch, pinprick, and  Vibration, in the upper and lower extremities Coordination: finger-nose-finger, heel-to-shin bilaterally, no dysmetria Reflexes: 1+ upper lower and symmetric, plantar responses were flexor bilaterally. Gait and Station: In wheelchair not ambulated    DIAGNOSTIC DATA (LABS, IMAGING, TESTING) - I reviewed patient records, labs, notes, testing and imaging myself where available.      ASSESSMENT AND PLAN   84 y.o. year old male with Idiopathic Parkinson's disease, cervical dystonia, severe anterocollis, moderate left tilt, gait abnormality, frequent falls.  He is now on hospice care  PLAN: Keep Sinemet at  current dose will refill Azilect 1 mg daily will refill Gait abnormality use walker at all times for safe ambulation at risk for falls and only with assistance Follow-up in 6 months if necessary pt wants to continue being seen here I spent 25 minutes in total face to face time with the patient/daughter more than 50% of which was spent counseling and coordination of care, reviewing test results reviewing medications and discussing and reviewing the diagnosis of Parkinson's disease, cervical dystonia and frequent falls.  Patient is now on hospice care [facility] Bible, [facility], [facility], [facility] [address] [place], [place] [address] [phone]

## 2017-09-08 ENCOUNTER — Telehealth: Payer: Self-pay | Admitting: Nurse Practitioner

## 2017-09-08 ENCOUNTER — Ambulatory Visit: Payer: Medicare Other | Admitting: Nurse Practitioner

## 2017-09-08 ENCOUNTER — Encounter: Payer: Self-pay | Admitting: Nurse Practitioner

## 2017-09-08 VITALS — BP 104/59 | HR 64 | Ht 67.0 in | Wt 153.8 lb

## 2017-09-08 DIAGNOSIS — G243 Spasmodic torticollis: Secondary | ICD-10-CM

## 2017-09-08 DIAGNOSIS — R296 Repeated falls: Secondary | ICD-10-CM

## 2017-09-08 DIAGNOSIS — G218 Other secondary parkinsonism: Secondary | ICD-10-CM | POA: Diagnosis not present

## 2017-09-08 MED ORDER — RASAGILINE MESYLATE 1 MG PO TABS: 1.0000 mg | ORAL_TABLET | Freq: Every day | ORAL | 2 refills | Status: DC

## 2017-09-08 MED ORDER — CARBIDOPA-LEVODOPA 25-100 MG PO TABS: 1.5000 | ORAL_TABLET | Freq: Three times a day (TID) | ORAL | 2 refills | Status: AC

## 2017-09-08 NOTE — Progress Notes (Signed)
I have reviewed and agreed above plan. 

## 2017-09-08 NOTE — Patient Instructions (Signed)
Per skilled sheet 

## 2017-09-08 NOTE — Telephone Encounter (Signed)
Pt. States he will come back as needed.

## 2017-10-12 DIAGNOSIS — N400 Enlarged prostate without lower urinary tract symptoms: Secondary | ICD-10-CM | POA: Diagnosis not present

## 2017-10-12 DIAGNOSIS — R2689 Other abnormalities of gait and mobility: Secondary | ICD-10-CM | POA: Diagnosis not present

## 2017-10-12 DIAGNOSIS — M6281 Muscle weakness (generalized): Secondary | ICD-10-CM | POA: Diagnosis not present

## 2017-10-12 DIAGNOSIS — R443 Hallucinations, unspecified: Secondary | ICD-10-CM | POA: Diagnosis not present

## 2017-10-14 DIAGNOSIS — Z515 Encounter for palliative care: Secondary | ICD-10-CM | POA: Diagnosis not present

## 2017-10-14 DIAGNOSIS — G20 Parkinson's disease: Secondary | ICD-10-CM | POA: Diagnosis not present

## 2017-10-15 DIAGNOSIS — Z7951 Long term (current) use of inhaled steroids: Secondary | ICD-10-CM | POA: Diagnosis not present

## 2017-10-15 DIAGNOSIS — M40209 Unspecified kyphosis, site unspecified: Secondary | ICD-10-CM | POA: Diagnosis not present

## 2017-10-15 DIAGNOSIS — N400 Enlarged prostate without lower urinary tract symptoms: Secondary | ICD-10-CM | POA: Diagnosis not present

## 2017-10-15 DIAGNOSIS — I10 Essential (primary) hypertension: Secondary | ICD-10-CM | POA: Diagnosis not present

## 2017-10-15 DIAGNOSIS — I959 Hypotension, unspecified: Secondary | ICD-10-CM | POA: Diagnosis not present

## 2017-10-15 DIAGNOSIS — G20 Parkinson's disease: Secondary | ICD-10-CM | POA: Diagnosis not present

## 2017-10-19 DIAGNOSIS — R54 Age-related physical debility: Secondary | ICD-10-CM | POA: Diagnosis not present

## 2017-10-19 DIAGNOSIS — M6281 Muscle weakness (generalized): Secondary | ICD-10-CM | POA: Diagnosis not present

## 2017-10-19 DIAGNOSIS — R2689 Other abnormalities of gait and mobility: Secondary | ICD-10-CM | POA: Diagnosis not present

## 2017-10-19 DIAGNOSIS — I10 Essential (primary) hypertension: Secondary | ICD-10-CM | POA: Diagnosis not present

## 2017-10-26 DIAGNOSIS — G20 Parkinson's disease: Secondary | ICD-10-CM | POA: Diagnosis not present

## 2017-10-26 DIAGNOSIS — I10 Essential (primary) hypertension: Secondary | ICD-10-CM | POA: Diagnosis not present

## 2017-10-26 DIAGNOSIS — R531 Weakness: Secondary | ICD-10-CM | POA: Diagnosis not present

## 2017-10-26 DIAGNOSIS — S50819A Abrasion of unspecified forearm, initial encounter: Secondary | ICD-10-CM | POA: Diagnosis not present

## 2017-11-04 DIAGNOSIS — R269 Unspecified abnormalities of gait and mobility: Secondary | ICD-10-CM | POA: Diagnosis not present

## 2017-11-04 DIAGNOSIS — R54 Age-related physical debility: Secondary | ICD-10-CM | POA: Diagnosis not present

## 2017-11-04 DIAGNOSIS — R531 Weakness: Secondary | ICD-10-CM | POA: Diagnosis not present

## 2017-11-04 DIAGNOSIS — M6240 Contracture of muscle, unspecified site: Secondary | ICD-10-CM | POA: Diagnosis not present

## 2017-11-09 DIAGNOSIS — B372 Candidiasis of skin and nail: Secondary | ICD-10-CM | POA: Diagnosis not present

## 2017-11-09 DIAGNOSIS — R400 Somnolence: Secondary | ICD-10-CM | POA: Diagnosis not present

## 2017-11-09 DIAGNOSIS — I10 Essential (primary) hypertension: Secondary | ICD-10-CM | POA: Diagnosis not present

## 2017-11-13 DIAGNOSIS — R400 Somnolence: Secondary | ICD-10-CM | POA: Diagnosis not present

## 2017-11-13 DIAGNOSIS — Z79899 Other long term (current) drug therapy: Secondary | ICD-10-CM | POA: Diagnosis not present

## 2017-11-13 DIAGNOSIS — E559 Vitamin D deficiency, unspecified: Secondary | ICD-10-CM | POA: Diagnosis not present

## 2017-11-23 DIAGNOSIS — E559 Vitamin D deficiency, unspecified: Secondary | ICD-10-CM | POA: Diagnosis not present

## 2017-11-23 DIAGNOSIS — N390 Urinary tract infection, site not specified: Secondary | ICD-10-CM | POA: Diagnosis not present

## 2017-11-23 DIAGNOSIS — I10 Essential (primary) hypertension: Secondary | ICD-10-CM | POA: Diagnosis not present

## 2017-11-23 DIAGNOSIS — R400 Somnolence: Secondary | ICD-10-CM | POA: Diagnosis not present

## 2017-11-28 DIAGNOSIS — N390 Urinary tract infection, site not specified: Secondary | ICD-10-CM | POA: Diagnosis not present

## 2017-11-30 DIAGNOSIS — R443 Hallucinations, unspecified: Secondary | ICD-10-CM | POA: Diagnosis not present

## 2017-11-30 DIAGNOSIS — R400 Somnolence: Secondary | ICD-10-CM | POA: Diagnosis not present

## 2017-11-30 DIAGNOSIS — R5382 Chronic fatigue, unspecified: Secondary | ICD-10-CM | POA: Diagnosis not present

## 2017-11-30 DIAGNOSIS — I10 Essential (primary) hypertension: Secondary | ICD-10-CM | POA: Diagnosis not present

## 2017-11-30 DIAGNOSIS — G20 Parkinson's disease: Secondary | ICD-10-CM | POA: Diagnosis not present

## 2017-11-30 DIAGNOSIS — R4182 Altered mental status, unspecified: Secondary | ICD-10-CM | POA: Diagnosis not present

## 2017-12-03 DIAGNOSIS — R443 Hallucinations, unspecified: Secondary | ICD-10-CM | POA: Diagnosis not present

## 2017-12-03 DIAGNOSIS — Z79899 Other long term (current) drug therapy: Secondary | ICD-10-CM | POA: Diagnosis not present

## 2017-12-03 DIAGNOSIS — R5383 Other fatigue: Secondary | ICD-10-CM | POA: Diagnosis not present

## 2017-12-04 DIAGNOSIS — R54 Age-related physical debility: Secondary | ICD-10-CM | POA: Diagnosis not present

## 2017-12-04 DIAGNOSIS — R531 Weakness: Secondary | ICD-10-CM | POA: Diagnosis not present

## 2017-12-04 DIAGNOSIS — R269 Unspecified abnormalities of gait and mobility: Secondary | ICD-10-CM | POA: Diagnosis not present

## 2017-12-04 DIAGNOSIS — M6240 Contracture of muscle, unspecified site: Secondary | ICD-10-CM | POA: Diagnosis not present

## 2017-12-07 DIAGNOSIS — R443 Hallucinations, unspecified: Secondary | ICD-10-CM | POA: Diagnosis not present

## 2017-12-07 DIAGNOSIS — R400 Somnolence: Secondary | ICD-10-CM | POA: Diagnosis not present

## 2017-12-07 DIAGNOSIS — G20 Parkinson's disease: Secondary | ICD-10-CM | POA: Diagnosis not present

## 2017-12-07 DIAGNOSIS — I10 Essential (primary) hypertension: Secondary | ICD-10-CM | POA: Diagnosis not present

## 2017-12-14 DIAGNOSIS — R300 Dysuria: Secondary | ICD-10-CM | POA: Diagnosis not present

## 2017-12-14 DIAGNOSIS — I10 Essential (primary) hypertension: Secondary | ICD-10-CM | POA: Diagnosis not present

## 2017-12-14 DIAGNOSIS — M40209 Unspecified kyphosis, site unspecified: Secondary | ICD-10-CM | POA: Diagnosis not present

## 2017-12-14 DIAGNOSIS — R269 Unspecified abnormalities of gait and mobility: Secondary | ICD-10-CM | POA: Diagnosis not present

## 2017-12-14 DIAGNOSIS — G20 Parkinson's disease: Secondary | ICD-10-CM | POA: Diagnosis not present

## 2017-12-15 DIAGNOSIS — G20 Parkinson's disease: Secondary | ICD-10-CM | POA: Diagnosis not present

## 2017-12-15 DIAGNOSIS — Z515 Encounter for palliative care: Secondary | ICD-10-CM | POA: Diagnosis not present

## 2017-12-16 DIAGNOSIS — R300 Dysuria: Secondary | ICD-10-CM | POA: Diagnosis not present

## 2017-12-17 DIAGNOSIS — R1310 Dysphagia, unspecified: Secondary | ICD-10-CM | POA: Diagnosis not present

## 2017-12-17 DIAGNOSIS — S72019A Unspecified intracapsular fracture of unspecified femur, initial encounter for closed fracture: Secondary | ICD-10-CM | POA: Diagnosis not present

## 2017-12-17 DIAGNOSIS — G20 Parkinson's disease: Secondary | ICD-10-CM | POA: Diagnosis not present

## 2017-12-21 DIAGNOSIS — R262 Difficulty in walking, not elsewhere classified: Secondary | ICD-10-CM | POA: Diagnosis not present

## 2017-12-21 DIAGNOSIS — G20 Parkinson's disease: Secondary | ICD-10-CM | POA: Diagnosis not present

## 2017-12-21 DIAGNOSIS — I10 Essential (primary) hypertension: Secondary | ICD-10-CM | POA: Diagnosis not present

## 2017-12-21 DIAGNOSIS — N390 Urinary tract infection, site not specified: Secondary | ICD-10-CM | POA: Diagnosis not present

## 2018-01-04 DIAGNOSIS — R54 Age-related physical debility: Secondary | ICD-10-CM | POA: Diagnosis not present

## 2018-01-04 DIAGNOSIS — R269 Unspecified abnormalities of gait and mobility: Secondary | ICD-10-CM | POA: Diagnosis not present

## 2018-01-04 DIAGNOSIS — M6240 Contracture of muscle, unspecified site: Secondary | ICD-10-CM | POA: Diagnosis not present

## 2018-01-04 DIAGNOSIS — R531 Weakness: Secondary | ICD-10-CM | POA: Diagnosis not present

## 2018-01-25 DIAGNOSIS — G20 Parkinson's disease: Secondary | ICD-10-CM | POA: Diagnosis not present

## 2018-01-25 DIAGNOSIS — B372 Candidiasis of skin and nail: Secondary | ICD-10-CM | POA: Diagnosis not present

## 2018-01-25 DIAGNOSIS — F4489 Other dissociative and conversion disorders: Secondary | ICD-10-CM | POA: Diagnosis not present

## 2018-01-25 DIAGNOSIS — S61412D Laceration without foreign body of left hand, subsequent encounter: Secondary | ICD-10-CM | POA: Diagnosis not present

## 2018-02-01 DIAGNOSIS — G20 Parkinson's disease: Secondary | ICD-10-CM | POA: Diagnosis not present

## 2018-02-01 DIAGNOSIS — S61412D Laceration without foreign body of left hand, subsequent encounter: Secondary | ICD-10-CM | POA: Diagnosis not present

## 2018-02-01 DIAGNOSIS — K5909 Other constipation: Secondary | ICD-10-CM | POA: Diagnosis not present

## 2018-02-01 DIAGNOSIS — F4489 Other dissociative and conversion disorders: Secondary | ICD-10-CM | POA: Diagnosis not present

## 2018-02-03 DIAGNOSIS — R300 Dysuria: Secondary | ICD-10-CM | POA: Diagnosis not present

## 2018-02-04 DIAGNOSIS — R269 Unspecified abnormalities of gait and mobility: Secondary | ICD-10-CM | POA: Diagnosis not present

## 2018-02-04 DIAGNOSIS — R531 Weakness: Secondary | ICD-10-CM | POA: Diagnosis not present

## 2018-02-04 DIAGNOSIS — G20 Parkinson's disease: Secondary | ICD-10-CM | POA: Diagnosis not present

## 2018-02-04 DIAGNOSIS — R54 Age-related physical debility: Secondary | ICD-10-CM | POA: Diagnosis not present

## 2018-02-04 DIAGNOSIS — M6240 Contracture of muscle, unspecified site: Secondary | ICD-10-CM | POA: Diagnosis not present

## 2018-02-04 DIAGNOSIS — Z515 Encounter for palliative care: Secondary | ICD-10-CM | POA: Diagnosis not present

## 2018-02-08 DIAGNOSIS — K5900 Constipation, unspecified: Secondary | ICD-10-CM | POA: Diagnosis not present

## 2018-02-08 DIAGNOSIS — S51819A Laceration without foreign body of unspecified forearm, initial encounter: Secondary | ICD-10-CM | POA: Diagnosis not present

## 2018-02-12 DIAGNOSIS — G20 Parkinson's disease: Secondary | ICD-10-CM | POA: Diagnosis not present

## 2018-02-12 DIAGNOSIS — I10 Essential (primary) hypertension: Secondary | ICD-10-CM | POA: Diagnosis not present

## 2018-02-12 DIAGNOSIS — S51812D Laceration without foreign body of left forearm, subsequent encounter: Secondary | ICD-10-CM | POA: Diagnosis not present

## 2018-02-15 DIAGNOSIS — G20 Parkinson's disease: Secondary | ICD-10-CM | POA: Diagnosis not present

## 2018-02-15 DIAGNOSIS — R269 Unspecified abnormalities of gait and mobility: Secondary | ICD-10-CM | POA: Diagnosis not present

## 2018-02-15 DIAGNOSIS — N390 Urinary tract infection, site not specified: Secondary | ICD-10-CM | POA: Diagnosis not present

## 2018-02-15 DIAGNOSIS — B372 Candidiasis of skin and nail: Secondary | ICD-10-CM | POA: Diagnosis not present

## 2018-02-16 DIAGNOSIS — N400 Enlarged prostate without lower urinary tract symptoms: Secondary | ICD-10-CM | POA: Diagnosis not present

## 2018-02-16 DIAGNOSIS — Z7982 Long term (current) use of aspirin: Secondary | ICD-10-CM | POA: Diagnosis not present

## 2018-02-16 DIAGNOSIS — Z9181 History of falling: Secondary | ICD-10-CM | POA: Diagnosis not present

## 2018-02-16 DIAGNOSIS — I10 Essential (primary) hypertension: Secondary | ICD-10-CM | POA: Diagnosis not present

## 2018-02-16 DIAGNOSIS — I959 Hypotension, unspecified: Secondary | ICD-10-CM | POA: Diagnosis not present

## 2018-02-16 DIAGNOSIS — M40209 Unspecified kyphosis, site unspecified: Secondary | ICD-10-CM | POA: Diagnosis not present

## 2018-02-16 DIAGNOSIS — S51812D Laceration without foreign body of left forearm, subsequent encounter: Secondary | ICD-10-CM | POA: Diagnosis not present

## 2018-02-16 DIAGNOSIS — Z7951 Long term (current) use of inhaled steroids: Secondary | ICD-10-CM | POA: Diagnosis not present

## 2018-02-16 DIAGNOSIS — G20 Parkinson's disease: Secondary | ICD-10-CM | POA: Diagnosis not present

## 2018-02-22 ENCOUNTER — Telehealth: Payer: Self-pay | Admitting: Nurse Practitioner

## 2018-02-22 NOTE — Telephone Encounter (Signed)
Spoke with Ivan Clark about her father's hallucinations. She stated that he has some hallucinations about of seeing dogs that come and go,but the one that worries her the most is her mom has been deceased for 3 years and he sometimes sees her. One moment he understands that she is in heaven and the next moment he thinks she's away helping family and becomes agitated when she doesn't come home. Patient has been placed on Carolyn's schedule for 02/23/18 at 10:15 am. Patient's daughter is aware of appointment and time.

## 2018-02-22 NOTE — Progress Notes (Signed)
[facility]  PATIENT: Ivan Clark DOB: [birth year]   REASON FOR VISIT: Follow-up for Parkinson's disease, cervical dystonia  HISTORY FROM: Patient and daughter    HISTORY OF PRESENT ILLNESS:YYHarold B Clark is 84 years old male, accompanied by his wife, initially seen in consultation by his primary care physician [name] for cervical dystonia, mild parkinsonian features. He has past medical history of bladder cancer, hypertension, prostate hypertrophy, over the past 20 years, he also developed gradual onset of anterocollois. Since 2012, wife noticed that he has slowed down significantly, had difficulty initiate gait, small shuffling gait, occasionally bilateral hands tremor, he also developed frequent lightheadedness, happen when he gets up quickly from sitting position, no passing out, consistent with a orthostatic blood pressure changes. He also has chronic constipation, acting out of dreams, loss of sense of smell for a long time period. He contributed to his loss of smell to his long time smoking history, he quit smoking 11 years ago, he used to smoke 2 packs a day. MRI of brain in August 2011: mild atrophy,small vessel disease, no acute lesions. MRA of brain was normal On examination he has mild parkinsonian features, Sinemet 25/100 3 times a day since 2012, he tolerated the medication well, there is mild improvement in his gait, he underwent emergency appendectomy in June 2013, recovered well from his surgery. He denies memory trouble. Selegiline since 04/2012, did very well, but had few episodes of dizziness,   Fainting spell in March 5th 2014, he got up early morning, around 5:45 AM, he was in bathroom, he felt funny after taking few steps, landed on the floor, he was taken to [name], was found to have orthostatic hypotension, was sent to the emergency room, reported normal CT scan. He continues to complain of lightheadedness when standing up  quickly,  He began to receive EMG guided butulinum toxin injection for his anterocollis and sialorrhea since March 19th 2014, the first one was in March 2014, the second injection was in July 2nd 2014, each time, he has mild improvement in straighting up his neck better, so he can walk better.  The third injection was October 10th 2014 , the same amount of xeomin 100 units total, 50 units to sternocleidal mastoid muscles, 5 days after the injection, he began to notice difficulty swallowing, especially with liquid, and hard solid food, eventually was admitted to hospital in February 28, 2013 swallowing study has demonstrated moderate pharyngeal and cervical esophageal dysphagia. He require NG tube for one day, dysphagia gradually resolved  He fell in April 09, 2013, with right hip fracture, he had a right hemiarthroplasty, recovered very well, is ambulating without assistance again, He is taking Sinemet IR 25/100 3 times a day, Sinemet CR 50/200 one tablet every night. He has no significant memory trouble, mild REM sleep disorder    UPDATE June 8th 2016:YY I have reviewed consult note from movement specialist [name] in March 2016, who concurred the diagnosis of mild Parkinson's disease, cervical dystonia, agree on current management of Sinemet immediate release 25/100 mg 3 times a day, Sinemet CR 50/200 mg once every night He has mild trouble swallowing, with big chunk solid food, he is still able to eat regular food, acting out of dreams, worsening anterocollis, still able to ambulate without assistance  His wife is using 24-hour oxygen now because of bronchiectasis   UPDATE 12/08/2016[name], 84 year old male returns for follow-up. He has a history of mild Parkinson's disease and cervical  dystonia and mild dysphagia. He can chock on certain foods such as grapes and he tries to avoid those. He has no problems with liquids. He denies any recent falls, he uses a cane for  ambulation. He does some stretching exercises. He does not perform any neck exercises. He reports that his memory is good, he is currently on Sinemet extended release at night and regular release 3 times a day before meals, he is also on Azilect. He returns for reevaluation. UPDATE 6/7/17CM  Ivan Clark 84 year old male returns for follow-up. He has a history of Parkinson's disease and cervical dystonia and mild dysphagia. Since last seen 6 months ago he has several falls with MRI from orthopedist in [place]  (epidural steroid 09-06-15 spinal nerve root block 2 wks ago5-23-17))  he has moved from his private home to living at [facility] in [place], [place].  Has [facility] orders to be signed for a hospital bed in a wheelchair. He reports that his appetite is better and he is chocking less. He has been set up for some physical therapy but that has not started. He reports no problems with his memory. He remains on Sinemet and Azilect. Minimal tremor seen today. He returns for reevaluation   UPDATE 17-May-2023 2017:YY His wife passed away in 2015-06-06,  He lives is [facility] living, he is doing well.  He is going physical therapy.  His legs feel like magnet, now walking with a walker.  He is taking Sinemet 25/100 3 times a day, carbidopa levodopa ER 50/200 one tablet at bedtime, Azilect 1 mg daily, he was noted to have tongue protrusion, mild dyskinesia He sleeps on his back, sleeps well.  UPDATE Jul 09 2016:YY He fell 3 times since June 05, 2016, most recent was on July 07 2016, he finished using bathroom, standing up washing his hand, without clear triggers, he fell backwards, landed on his back, now with right side chest pain, and sternal pain upon deep breathing, He is taking Sinemet 25/100 mg one and half tablets 3 times a day, Azilect 1 mg daily, he tolerates the medication well, Today he was found to have irregular heart rhythm,  UPDATE Sep 10 2016:YY I was able to review his cardiology visit with Dr.  Wende Bushy on July 16 2016, poor progression of R-wave on EKG, he does have risk factor of coronary artery disease including advanced age, hypertension, hyperlipidemia, proceed with stress testing, optimize blood pressure control, He fell on August 15 2016, he fell backward, no LOC, today's blood pressure was noted to be elevated, he has missed his morning dose of medications UPDATE 10/29/2018[name] 84 year old male returns for follow-up with his daughter. He has a history of  Parkinson's disease and cervical dystonia. He also has gait abnormality and has had 2 falls since last seen. He fell most recently in October and fractured his clavicle. He currently resides in assisted living facility. He requires assistance with activities of daily living except feeding himself. He goes to chair exercises 30 minutes 5 days a week. He ambulates with a rolling walker. Appetite is good and he is sleeping well. He remains on Azilect and Sinemet without side effects. He denies any hallucinations or confusion. Blood pressure elevated in the office today however he claims he has not taken his morning meds. He returns for reevaluation UPDATE 4/30/2019[name], 61 male returns for follow-up with history of Parkinson's disease and cervical dystonia.  Since last seen he has had multiple falls, at least 7.  He is in a wheelchair now and does not ambulate unless he has assistance.  He was admitted to [facility] place for rehab in February.  He is back at his assisted living and hospice is now involved.  He remains on Sinemet and Azilect without side effects.  He requires assistance with all activities of daily living, memory is good.  He denies hallucinating.  Appetite is good and he is sleeping well at night.  He returns for reevaluation  UPDATE 10/15/2019[name], 84 year old male returns for follow-up with history of Parkinson's disease and cervical dystonia.  No recent falls he stays in a wheelchair  most of the day now and does not ambulate.  He is currently getting some physical therapy.  Hospice is no longer involved.  He remains in assisted living.  Daughter reports that he has been having intermittent hallucinations of his deceased wife.  He becomes agitated when she does not come home but other days he knows she is in heaven.  Appetite is good and he is sleeping well.  He is currently on carbidopa levodopa 25 101-1/2 tabs 3 times daily and Azilect 1 mg daily.  Discussed antipsychotic medications with his daughter Abigail Butts.  He returns for reevaluation REVIEW OF SYSTEMS: Full 14 system review of systems performed and notable only for those listed, all others are neg:  Constitutional: neg  Cardiovascular: neg Ear/Nose/Throat: neg  Skin: neg Eyes: neg Respiratory: neg Gastroitestinal: neg  Hematology/Lymphatic: neg  Endocrine: neg Musculoskeletal: Gait abnormality,  Allergy/Immunology: neg Neurological: Cervical dystonia, Parkinson's disease Psychiatric: Intermittent hallucinations Sleep : neg   ALLERGIES: No Known Allergies  HOME MEDICATIONS: Outpatient Medications Prior to Visit  Medication Sig Dispense Refill  . acetaminophen (TYLENOL) 500 MG tablet Take 1 tablet (500 mg total) by mouth every 6 (six) hours as needed for mild pain, fever or headache. 30 tablet 0  . amLODipine (NORVASC) 10 MG tablet Take 10 mg by mouth daily.   2  . aspirin EC 81 MG tablet Take 1 tablet (81 mg total) by mouth daily after supper.    [name] atorvastatin (LIPITOR) 20 MG tablet Take 1 tablet (20 mg total) by mouth daily. 30 tablet 1  . carbidopa-levodopa (SINEMET IR) 25-100 MG tablet Take 1.5 tablets by mouth 3 (three) times daily. 405 tablet 2  . Cholecalciferol (VITAMIN D3) 2000 units TABS Take 2,000 Units by mouth daily.    . diclofenac sodium (VOLTAREN) 1 % GEL Apply 2 g topically 4 (four) times daily. Massage 2 grams to the back of the neck and shoulders QID for pain, inflammation d/t positioning/  contractures r/t Parkinson's    . finasteride (PROSCAR) 5 MG tablet Take 5 mg by mouth every evening.   0  . fluticasone (FLONASE) 50 MCG/ACT nasal spray Place 1 spray into both nostrils 2 (two) times daily as needed (runny nose/ sneezing).   0  . irbesartan (AVAPRO) 150 MG tablet Take 1 tablet (150 mg total) by mouth daily. 30 tablet 2  . metoprolol succinate (TOPROL-XL) 25 MG 24 hr tablet Take 25 mg by mouth daily.    . miconazole (MICOTIN) 2 % cream Apply 1 application topically daily. Apply to groin and sacrum    . naproxen sodium (ALEVE) 220 MG tablet Take 220 mg by mouth daily as needed.    . nystatin (NYSTATIN) powder Apply 2 g topically 2 (two) times daily as needed (redness/ burning of scrotum).    Vladimir Faster Glycol-Propyl Glycol (SYSTANE) 0.4-0.3 % SOLN Apply 1 application  to eye 4 (four) times daily as needed (dry eyes).     . rasagiline (AZILECT) 1 MG TABS tablet Take 1 tablet (1 mg total) by mouth daily. 90 tablet 2  . sennosides-docusate sodium (SENOKOT-S) 8.6-50 MG tablet Take 1 tablet by mouth at bedtime.     . tamsulosin (FLOMAX) 0.4 MG CAPS capsule Take 1 capsule (0.4 mg total) by mouth daily after supper. 30 capsule 1   Facility-Administered Medications Prior to Visit  Medication Dose Route Frequency Provider Last Rate Last Dose  . mitomycin (MUTAMYCIN) chemo injection 40 mg  40 mg Bladder Instillation Once Festus Aloe, MD        PAST MEDICAL HISTORY: Past Medical History:  Diagnosis Date  . Aneurysm of infrarenal abdominal aorta (HCC) SUPRAILIAC  4CM PER CT 10-20-2010--  STABLE  . Basal cell carcinoma   . Bladder cancer ([place]) RECURRENT  (FIRST DX  2001)   HX TURBT AND CHEMO BLADDER INSTILLATION TX'S  . Frequency of urination   . Hyperlipidemia    unspecified  . Hypertension   . Nocturia   . Orthostatic hypotension   . Parkinson disease ([place])   . Peripheral neuropathy     PAST SURGICAL HISTORY: Past Surgical History:  Procedure Laterality Date  .  APPENDECTOMY  10-20-2010  . CYSTO/ BLADDER BX/ FULGERATION BLADDER TUMOR  10-09-2004  . CYSTO/ RETROGRADE PYELOGRAM/ BLADDER BX'S  07-03-2004  . CYSTOSCOPY WITH BIOPSY  11/28/2011   Procedure: CYSTOSCOPY WITH BIOPSY;  Surgeon: Fredricka Bonine, MD;  Location: [facility];  Service: Urology;  Laterality: N/A;  . HIP ARTHROPLASTY Right 04/09/2013   Procedure: ARTHROPLASTY BIPOLAR HIP;  Surgeon: Augustin Schooling, MD;  Location: [facility];  Service: Orthopedics;  Laterality: Right;  . LEFT INGUINAL HERNIA REPAIR  1996  . TONSILLECTOMY AND ADENOIDECTOMY  1938  . TRANSURETHRAL RESECTION OF BLADDER TUMOR  09-11-1999   BLADDER CANCER  . VASECTOMY      FAMILY HISTORY: Family History  Problem Relation Age of Onset  . Alzheimer's disease Mother   . Heart failure Father   . Kidney cancer Neg Hx   . Prostate cancer Neg Hx   . Basal cell carcinoma Neg Hx   . Melanoma Neg Hx   . Squamous cell carcinoma Neg Hx     SOCIAL HISTORY: Social History   Socioeconomic History  . Marital status: Widowed    Spouse name: Katharine Look  . Number of children: 4  . Years of education: college  . Highest education level: Not on file  Occupational History    Comment: retired  Scientific laboratory technician  . Financial resource strain: Not on file  . Food insecurity:    Worry: Not on file    Inability: Not on file  . Transportation needs:    Medical: Not on file    Non-medical: Not on file  Tobacco Use  . Smoking status: Former Smoker    Packs/day: 2.00    Years: 50.00    Pack years: 100.00    Types: Cigarettes    Last attempt to quit: 05/13/1999    Years since quitting: 18.7  . Smokeless tobacco: Never Used  Substance and Sexual Activity  . Alcohol use: No    Alcohol/week: 0.0 standard drinks  . Drug use: No  . Sexual activity: Not on file  Lifestyle  . Physical activity:    Days per week: Not on file    Minutes per session: Not on file  . Stress: Not on file  Relationships  . Social  connections:    Talks on phone: Not on file    Gets together: Not on file    Attends religious service: Not on file    Active member of club or organization: Not on file    Attends meetings of clubs or organizations: Not on file    Relationship status: Not on file  . Intimate partner violence:    Fear of current or ex partner: Not on file    Emotionally abused: Not on file    Physically abused: Not on file    Forced sexual activity: Not on file  Other Topics Concern  . Not on file  Social History Narrative   Patient lives at [facility] in [place], [place]   Education- college education.   Retired.   Right handed.   Caffeine- Very Little     PHYSICAL EXAM  Vitals:   02/23/18 1018  BP: 113/63  Pulse: 62  Weight: 146 lb 3.2 oz (66.3 kg)  Height: 5\' 7"  (1.702 m)   Body mass index is 22.9 kg/m.  Generalized: Well developed, in no acute distress  Head: normocephalic and atraumatic,. Oropharynx benign  Neck: Cervical dystonia,  Musculoskeletal: No deformity  Skin buises on forearms Neurological examination   Mentation: Alert oriented to time, place, history taking. Attention span and concentration appropriate. Recent and remote memory intact.  Follows all commands speech and language fluent.   Cranial nerve II-XII: Pupils were equal round reactive to light extraocular movements were full, visual field were full on confrontational test. Facial sensation and strength were normal. hearing was intact to finger rubbing bilaterally. Uvula tongue midline. head turning and shoulder shrug were normal and symmetric.Tongue protrusion into cheek strength was normal. Motor: normal bulk and tone, full strength in the BUE, BLE, mild bilateral upper extremity rigidity right worse than left , severe anterocollis , neck not able to pass erect position  Sensory: normal and symmetric to light touch,  in the upper and lower extremities Coordination: finger-nose-finger, , apraxia with use of  extremities Reflexes: 1+ upper lower and symmetric, plantar responses were flexor bilaterally. Gait and Station: In wheelchair not ambulated due to safety concerns   DIAGNOSTIC DATA (LABS, IMAGING, TESTING) - I reviewed patient records, labs, notes, testing and imaging myself where available.      ASSESSMENT AND PLAN   84 y.o. year old male with Idiopathic Parkinson's disease, cervical dystonia, severe anterocollis, moderate left tilt, gait abnormality, now with intermittent hallucinations   PLAN: Discussed with Dr. Krista Blue  We will decrease  Sinemet to 1 tab 3 times daily  Continue Azilect 1 mg daily  Gait abnormality at risk for falls and only with assistance Follow-up in 6 months  I spent 25 minutes in total face to face time with the patient/daughter more than 50% of which was spent counseling and coordination of care, reviewing test results reviewing medications and discussing and reviewing the diagnosis of Parkinson's disease, cervical dystonia and frequent falls.  Also discussed hallucinations and antipsychotics used to treat with warnings and black box label particularly Dennie Bible, [facility], [facility], APRN  [facility] [address], [place], [place] [address] [phone]

## 2018-02-22 NOTE — Telephone Encounter (Signed)
Patient's daughter [name] (on [place]) states patient is having hallucinations x2 months.. Please call and discuss.[name]

## 2018-02-23 ENCOUNTER — Ambulatory Visit: Payer: Medicare Other | Admitting: Nurse Practitioner

## 2018-02-23 ENCOUNTER — Encounter: Payer: Self-pay | Admitting: Nurse Practitioner

## 2018-02-23 VITALS — BP 113/63 | HR 62 | Ht 67.0 in | Wt 146.2 lb

## 2018-02-23 DIAGNOSIS — R269 Unspecified abnormalities of gait and mobility: Secondary | ICD-10-CM | POA: Diagnosis not present

## 2018-02-23 DIAGNOSIS — G243 Spasmodic torticollis: Secondary | ICD-10-CM | POA: Diagnosis not present

## 2018-02-23 DIAGNOSIS — Z515 Encounter for palliative care: Secondary | ICD-10-CM | POA: Diagnosis not present

## 2018-02-23 DIAGNOSIS — G218 Other secondary parkinsonism: Secondary | ICD-10-CM | POA: Diagnosis not present

## 2018-02-23 DIAGNOSIS — G20 Parkinson's disease: Secondary | ICD-10-CM | POA: Diagnosis not present

## 2018-02-23 NOTE — Patient Instructions (Signed)
See skilled sheet

## 2018-03-01 DIAGNOSIS — K5900 Constipation, unspecified: Secondary | ICD-10-CM | POA: Diagnosis not present

## 2018-03-01 DIAGNOSIS — F4489 Other dissociative and conversion disorders: Secondary | ICD-10-CM | POA: Diagnosis not present

## 2018-03-01 DIAGNOSIS — R1084 Generalized abdominal pain: Secondary | ICD-10-CM | POA: Diagnosis not present

## 2018-03-01 DIAGNOSIS — G20 Parkinson's disease: Secondary | ICD-10-CM | POA: Diagnosis not present

## 2018-03-01 DIAGNOSIS — L304 Erythema intertrigo: Secondary | ICD-10-CM | POA: Diagnosis not present

## 2018-03-02 NOTE — Progress Notes (Signed)
I have reviewed and agreed above plan. 

## 2018-03-03 DIAGNOSIS — R1011 Right upper quadrant pain: Secondary | ICD-10-CM | POA: Diagnosis not present

## 2018-03-04 DIAGNOSIS — R300 Dysuria: Secondary | ICD-10-CM | POA: Diagnosis not present

## 2018-03-06 DIAGNOSIS — R531 Weakness: Secondary | ICD-10-CM | POA: Diagnosis not present

## 2018-03-06 DIAGNOSIS — M6240 Contracture of muscle, unspecified site: Secondary | ICD-10-CM | POA: Diagnosis not present

## 2018-03-06 DIAGNOSIS — R54 Age-related physical debility: Secondary | ICD-10-CM | POA: Diagnosis not present

## 2018-03-06 DIAGNOSIS — R269 Unspecified abnormalities of gait and mobility: Secondary | ICD-10-CM | POA: Diagnosis not present

## 2018-03-08 DIAGNOSIS — K567 Ileus, unspecified: Secondary | ICD-10-CM | POA: Diagnosis not present

## 2018-03-08 DIAGNOSIS — N390 Urinary tract infection, site not specified: Secondary | ICD-10-CM | POA: Diagnosis not present

## 2018-03-08 DIAGNOSIS — I714 Abdominal aortic aneurysm, without rupture: Secondary | ICD-10-CM | POA: Diagnosis not present

## 2018-03-08 DIAGNOSIS — G20 Parkinson's disease: Secondary | ICD-10-CM | POA: Diagnosis not present

## 2018-03-28 ENCOUNTER — Emergency Department: Payer: Medicare Other

## 2018-03-28 ENCOUNTER — Encounter: Payer: Self-pay | Admitting: Emergency Medicine

## 2018-03-28 ENCOUNTER — Other Ambulatory Visit: Payer: Self-pay

## 2018-03-28 ENCOUNTER — Emergency Department
Admission: EM | Admit: 2018-03-28 | Discharge: 2018-03-28 | Disposition: A | Discharge status: Home / Self Care | Payer: Medicare Other | Attending: Emergency Medicine | Admitting: Emergency Medicine

## 2018-03-28 DIAGNOSIS — W0110XA Fall on same level from slipping, tripping and stumbling with subsequent striking against unspecified object, initial encounter: Secondary | ICD-10-CM | POA: Insufficient documentation

## 2018-03-28 DIAGNOSIS — W19XXXA Unspecified fall, initial encounter: Secondary | ICD-10-CM

## 2018-03-28 DIAGNOSIS — G20 Parkinson's disease: Secondary | ICD-10-CM | POA: Diagnosis not present

## 2018-03-28 DIAGNOSIS — S00412A Abrasion of left ear, initial encounter: Secondary | ICD-10-CM | POA: Insufficient documentation

## 2018-03-28 DIAGNOSIS — Z96641 Presence of right artificial hip joint: Secondary | ICD-10-CM | POA: Diagnosis not present

## 2018-03-28 DIAGNOSIS — Z87891 Personal history of nicotine dependence: Secondary | ICD-10-CM | POA: Diagnosis not present

## 2018-03-28 DIAGNOSIS — Y9389 Activity, other specified: Secondary | ICD-10-CM | POA: Insufficient documentation

## 2018-03-28 DIAGNOSIS — S0081XA Abrasion of other part of head, initial encounter: Secondary | ICD-10-CM | POA: Diagnosis not present

## 2018-03-28 DIAGNOSIS — S81012A Laceration without foreign body, left knee, initial encounter: Secondary | ICD-10-CM | POA: Insufficient documentation

## 2018-03-28 DIAGNOSIS — I10 Essential (primary) hypertension: Secondary | ICD-10-CM | POA: Insufficient documentation

## 2018-03-28 DIAGNOSIS — S81011A Laceration without foreign body, right knee, initial encounter: Secondary | ICD-10-CM | POA: Diagnosis not present

## 2018-03-28 DIAGNOSIS — S0990XA Unspecified injury of head, initial encounter: Secondary | ICD-10-CM | POA: Diagnosis present

## 2018-03-28 DIAGNOSIS — Z85828 Personal history of other malignant neoplasm of skin: Secondary | ICD-10-CM | POA: Diagnosis not present

## 2018-03-28 DIAGNOSIS — S00432A Contusion of left ear, initial encounter: Secondary | ICD-10-CM | POA: Diagnosis not present

## 2018-03-28 DIAGNOSIS — Z79899 Other long term (current) drug therapy: Secondary | ICD-10-CM | POA: Diagnosis not present

## 2018-03-28 DIAGNOSIS — Y998 Other external cause status: Secondary | ICD-10-CM | POA: Diagnosis not present

## 2018-03-28 DIAGNOSIS — Y92121 Bathroom in nursing home as the place of occurrence of the external cause: Secondary | ICD-10-CM | POA: Insufficient documentation

## 2018-03-28 DIAGNOSIS — T148XXA Other injury of unspecified body region, initial encounter: Secondary | ICD-10-CM

## 2018-03-28 DIAGNOSIS — Z8551 Personal history of malignant neoplasm of bladder: Secondary | ICD-10-CM | POA: Diagnosis not present

## 2018-03-28 DIAGNOSIS — Z7982 Long term (current) use of aspirin: Secondary | ICD-10-CM | POA: Insufficient documentation

## 2018-03-28 DIAGNOSIS — S199XXA Unspecified injury of neck, initial encounter: Secondary | ICD-10-CM | POA: Diagnosis not present

## 2018-03-28 DIAGNOSIS — M6281 Muscle weakness (generalized): Secondary | ICD-10-CM | POA: Diagnosis not present

## 2018-03-28 NOTE — ED Notes (Signed)
Patient transported to CT 

## 2018-03-28 NOTE — ED Notes (Signed)
Pt returned from CT °

## 2018-03-28 NOTE — ED Triage Notes (Signed)
EMS pt to rm 4 from [facility]. Pt has parkinsons and had mechanical fall in the bathroom.No LOC. Swelling and bruising noted to area behind his left ear. Abrasions and skin tears noted to both knees.

## 2018-03-28 NOTE — Discharge Instructions (Addendum)
Please seek medical attention for any high fevers, chest pain, shortness of breath, change in behavior, persistent vomiting, bloody stool or any other new or concerning symptoms.  

## 2018-03-28 NOTE — ED Notes (Signed)
Pt assisted to urinate in urinal and wet diaper removed. Peri-care performed and clean diaper placed.

## 2018-03-28 NOTE — ED Provider Notes (Signed)
[facility] Emergency Department Provider Note ____________________________________________   I have reviewed the triage vital signs and the nursing notes.   HISTORY  Chief Complaint Fall   History limited by: Not Limited   HPI Ivan Clark is a 84 y.o. male who presents to the emergency department today via EMS after a fall.  Patient states he was in the shower when he started feeling like he was can go down.  He did hit his head.  He denies any loss of consciousness.  Patient and family states that he does have some frequency of falls.  He has not noticed any recent illness.  He did suffer some skin tear on his knees although denies any significant pain anywhere.   Per medical record review patient has a history of tdap 03/2017  Past Medical History:  Diagnosis Date  . Aneurysm of infrarenal abdominal aorta (HCC) SUPRAILIAC  4CM PER CT 10-20-2010--  STABLE  . Basal cell carcinoma   . Bladder cancer ([place]) RECURRENT  (FIRST DX  2001)   HX TURBT AND CHEMO BLADDER INSTILLATION TX'S  . Frequency of urination   . Hyperlipidemia    unspecified  . Hypertension   . Nocturia   . Orthostatic hypotension   . Parkinson disease ([place])   . Peripheral neuropathy     Patient Active Problem List   Diagnosis Date Noted  . Gait abnormality 02/23/2018  . Cellulitis of left upper extremity 06/21/2017  . Cellulitis of left hand 06/21/2017  . Fall 09/10/2016  . Frequent falls 10/17/2015  . Fracture of femoral neck, right ([place]) 04/14/2013  . Right femoral fracture ([place]) 04/09/2013  . Hyperlipidemia 04/09/2013  . Benign prostatic hyperplasia 04/09/2013  . Closed right hip fracture ([place]) 04/09/2013  . Hip fracture, right ([place]) 04/09/2013  . Femoral neck fracture ([place]) 04/09/2013  . Dysphagia, pharyngeal 03/02/2013  . Cervical dystonia 07/28/2012  . Sialorrhea 07/28/2012  . Personal history of malignant neoplasm of bladder 07/27/2012  . Essential hypertension  07/27/2012  . Dizziness and giddiness 07/27/2012  . Parkinsonism ([place]) 07/27/2012    Past Surgical History:  Procedure Laterality Date  . APPENDECTOMY  10-20-2010  . CYSTO/ BLADDER BX/ FULGERATION BLADDER TUMOR  10-09-2004  . CYSTO/ RETROGRADE PYELOGRAM/ BLADDER BX'S  07-03-2004  . CYSTOSCOPY WITH BIOPSY  11/28/2011   Procedure: CYSTOSCOPY WITH BIOPSY;  Surgeon: Fredricka Bonine, MD;  Location: [facility];  Service: Urology;  Laterality: N/A;  . HIP ARTHROPLASTY Right 04/09/2013   Procedure: ARTHROPLASTY BIPOLAR HIP;  Surgeon: Augustin Schooling, MD;  Location: [place];  Service: Orthopedics;  Laterality: Right;  . LEFT INGUINAL HERNIA REPAIR  1996  . TONSILLECTOMY AND ADENOIDECTOMY  1938  . TRANSURETHRAL RESECTION OF BLADDER TUMOR  09-11-1999   BLADDER CANCER  . VASECTOMY      Prior to Admission medications   Medication Sig Start Date End Date Taking? Authorizing Provider  acetaminophen (TYLENOL) 500 MG tablet Take 1 tablet (500 mg total) by mouth every 6 (six) hours as needed for mild pain, fever or headache. 06/23/17   Gladstone Lighter, MD  amLODipine (NORVASC) 10 MG tablet Take 10 mg by mouth daily.  02/26/17   [provider]  aspirin EC 81 MG tablet Take 1 tablet (81 mg total) by mouth daily after supper. 04/22/13   Angiulli, Lavon Paganini, PA-C  atorvastatin (LIPITOR) 20 MG tablet Take 1 tablet (20 mg total) by mouth daily. 04/22/13   Angiulli, Lavon Paganini, PA-C  carbidopa-levodopa (SINEMET  IR) 25-100 MG tablet Take 1.5 tablets by mouth 3 (three) times daily. Patient taking differently: Take 1 tablet by mouth 3 (three) times daily.  09/08/17   Dennie Bible, NP  Cholecalciferol (VITAMIN D3) 2000 units TABS Take 2,000 Units by mouth daily.    [provider]  diclofenac sodium (VOLTAREN) 1 % GEL Apply 2 g topically 4 (four) times daily. Massage 2 grams to the back of the neck and shoulders QID for pain, inflammation d/t positioning/ contractures  r/t Parkinson's    [provider]  finasteride (PROSCAR) 5 MG tablet Take 5 mg by mouth every evening.  05/22/14   [provider]  fluticasone (FLONASE) 50 MCG/ACT nasal spray Place 1 spray into both nostrils 2 (two) times daily as needed (runny nose/ sneezing).  06/02/16   [provider]  irbesartan (AVAPRO) 150 MG tablet Take 1 tablet (150 mg total) by mouth daily. 06/23/17   Gladstone Lighter, MD  metoprolol succinate (TOPROL-XL) 25 MG 24 hr tablet Take 25 mg by mouth daily.    [provider]  miconazole (MICOTIN) 2 % cream Apply 1 application topically daily. Apply to groin and sacrum    [provider]  naproxen sodium (ALEVE) 220 MG tablet Take 220 mg by mouth daily as needed.    [provider]  nystatin (NYSTATIN) powder Apply 2 g topically 2 (two) times daily as needed (redness/ burning of scrotum).    [provider]  Polyethyl Glycol-Propyl Glycol (SYSTANE) 0.4-0.3 % SOLN Apply 1 application to eye 4 (four) times daily as needed (dry eyes).     [provider]  rasagiline (AZILECT) 1 MG TABS tablet Take 1 tablet (1 mg total) by mouth daily. 09/08/17   Dennie Bible, NP  sennosides-docusate sodium (SENOKOT-S) 8.6-50 MG tablet Take 1 tablet by mouth at bedtime.     [provider]  tamsulosin (FLOMAX) 0.4 MG CAPS capsule Take 1 capsule (0.4 mg total) by mouth daily after supper. 04/22/13   Angiulli, Lavon Paganini, PA-C    Allergies Patient has no known allergies.  Family History  Problem Relation Age of Onset  . Alzheimer's disease Mother   . Heart failure Father   . Kidney cancer Neg Hx   . Prostate cancer Neg Hx   . Basal cell carcinoma Neg Hx   . Melanoma Neg Hx   . Squamous cell carcinoma Neg Hx     Social History Social History   Tobacco Use  . Smoking status: Former Smoker    Packs/day: 2.00    Years: 50.00    Pack years: 100.00    Types: Cigarettes    Last attempt to quit:  05/13/1999    Years since quitting: 18.8  . Smokeless tobacco: Never Used  Substance Use Topics  . Alcohol use: No    Alcohol/week: 0.0 standard drinks  . Drug use: No    Review of Systems Constitutional: No fever/chills Eyes: No visual changes. ENT: No sore throat. Cardiovascular: Denies chest pain. Respiratory: Denies shortness of breath. Gastrointestinal: No abdominal pain.  No nausea, no vomiting.  No diarrhea.   Genitourinary: Negative for dysuria. Musculoskeletal: Negative for back pain. Skin: Positive for skin tears on knees Neurological: Negative for headaches, focal weakness or numbness.  ____________________________________________   PHYSICAL EXAM:  VITAL SIGNS: ED Triage Vitals  Enc Vitals Group     BP 03/28/18 2150 (!) 157/67     Pulse Rate 03/28/18 2150 (!) 55     Resp  03/28/18 2150 18     Temp 03/28/18 2150 97.6 F (36.4 C)     Temp Source 03/28/18 2150 Oral     SpO2 03/28/18 2150 97 %     Weight 03/28/18 2151 144 lb (65.3 kg)     Height 03/28/18 2151 5\' 7"  (1.702 m)     Head Circumference --      Peak Flow --      Pain Score 03/28/18 2151 0   Constitutional: Alert and oriented.  Eyes: Conjunctivae are normal.  ENT      Head: Normocephalic.       Nose: No congestion/rhinnorhea.      Mouth/Throat: Mucous membranes are moist.      Neck: No stridor. Hematological/Lymphatic/Immunilogical: No cervical lymphadenopathy. Cardiovascular: Normal rate, regular rhythm.  No murmurs, rubs, or gallops. Respiratory: Normal respiratory effort without tachypnea nor retractions. Breath sounds are clear and equal bilaterally. No wheezes/rales/rhonchi. Gastrointestinal: Soft and non tender. No rebound. No guarding.  Genitourinary: Deferred Musculoskeletal: Normal range of motion in all extremities. No lower extremity edema. Neurologic:  Normal speech and language. No gross focal neurologic deficits are appreciated.  Skin:  Skin tear noted to left knee, right shin. Small  abrasion behind left ear. Psychiatric: Mood and affect are normal. Speech and behavior are normal. Patient exhibits appropriate insight and judgment.  ____________________________________________    LABS (pertinent positives/negatives)  None  ____________________________________________   EKG  None  ____________________________________________    RADIOLOGY  CT head/cervical spine No acute abnormalities  ____________________________________________   PROCEDURES  Procedures  ____________________________________________   INITIAL IMPRESSION / ASSESSMENT AND PLAN / ED COURSE  Pertinent labs & imaging results that were available during my care of the patient were reviewed by me and considered in my medical decision making (see chart for details).   Patient presented to the emergency department today after a fall.  Discussion with patient family he does have frequent falls and they feel comfortable deferring blood work and urine at this time.  Patient's head CT and cervical spine CT did without any concerning acute findings.  Patient has some skin tear on of his lower extremities however no tenderness to palpation.  ____________________________________________   FINAL CLINICAL IMPRESSION(S) / ED DIAGNOSES  Final diagnoses:  Fall, initial encounter  Abrasion     Note: This dictation was prepared with Dragon dictation. Any transcriptional errors that result from this process are unintentional     Nance Pear, MD 03/28/18 [phone]

## 2018-03-28 NOTE — ED Notes (Signed)
Pt's daughter at bedside and reports pt is at his baseline other than the bruising and swelling noted from the fall. Pt has hx of parkinson's.

## 2018-03-29 DIAGNOSIS — G20 Parkinson's disease: Secondary | ICD-10-CM | POA: Diagnosis not present

## 2018-03-29 DIAGNOSIS — J300 Vasomotor rhinitis: Secondary | ICD-10-CM | POA: Diagnosis not present

## 2018-03-29 DIAGNOSIS — H6123 Impacted cerumen, bilateral: Secondary | ICD-10-CM | POA: Diagnosis not present

## 2018-03-29 DIAGNOSIS — S0990XA Unspecified injury of head, initial encounter: Secondary | ICD-10-CM | POA: Diagnosis not present

## 2018-03-30 ENCOUNTER — Other Ambulatory Visit: Payer: Medicare Other | Admitting: Urology

## 2018-04-01 ENCOUNTER — Other Ambulatory Visit: Payer: Medicare Other

## 2018-04-01 ENCOUNTER — Other Ambulatory Visit: Payer: Self-pay

## 2018-04-01 ENCOUNTER — Emergency Department: Payer: Medicare Other

## 2018-04-01 ENCOUNTER — Encounter: Payer: Self-pay | Admitting: Emergency Medicine

## 2018-04-01 ENCOUNTER — Emergency Department
Admission: EM | Admit: 2018-04-01 | Discharge: 2018-04-01 | Disposition: A | Discharge status: Nursing Home (Includes ICF) | Payer: Medicare Other | Attending: Emergency Medicine | Admitting: Emergency Medicine

## 2018-04-01 DIAGNOSIS — Y92008 Other place in unspecified non-institutional (private) residence as the place of occurrence of the external cause: Secondary | ICD-10-CM | POA: Diagnosis not present

## 2018-04-01 DIAGNOSIS — Z87891 Personal history of nicotine dependence: Secondary | ICD-10-CM | POA: Insufficient documentation

## 2018-04-01 DIAGNOSIS — Y9389 Activity, other specified: Secondary | ICD-10-CM | POA: Diagnosis not present

## 2018-04-01 DIAGNOSIS — Z7982 Long term (current) use of aspirin: Secondary | ICD-10-CM | POA: Insufficient documentation

## 2018-04-01 DIAGNOSIS — S00412A Abrasion of left ear, initial encounter: Secondary | ICD-10-CM | POA: Diagnosis not present

## 2018-04-01 DIAGNOSIS — S80211A Abrasion, right knee, initial encounter: Secondary | ICD-10-CM | POA: Insufficient documentation

## 2018-04-01 DIAGNOSIS — S81011A Laceration without foreign body, right knee, initial encounter: Secondary | ICD-10-CM | POA: Diagnosis not present

## 2018-04-01 DIAGNOSIS — W19XXXA Unspecified fall, initial encounter: Secondary | ICD-10-CM | POA: Diagnosis not present

## 2018-04-01 DIAGNOSIS — G20 Parkinson's disease: Secondary | ICD-10-CM | POA: Insufficient documentation

## 2018-04-01 DIAGNOSIS — S0990XA Unspecified injury of head, initial encounter: Secondary | ICD-10-CM | POA: Diagnosis not present

## 2018-04-01 DIAGNOSIS — I10 Essential (primary) hypertension: Secondary | ICD-10-CM | POA: Diagnosis not present

## 2018-04-01 DIAGNOSIS — Y999 Unspecified external cause status: Secondary | ICD-10-CM | POA: Insufficient documentation

## 2018-04-01 DIAGNOSIS — Z79899 Other long term (current) drug therapy: Secondary | ICD-10-CM | POA: Diagnosis not present

## 2018-04-01 DIAGNOSIS — S199XXA Unspecified injury of neck, initial encounter: Secondary | ICD-10-CM | POA: Diagnosis not present

## 2018-04-01 DIAGNOSIS — T148XXA Other injury of unspecified body region, initial encounter: Secondary | ICD-10-CM

## 2018-04-01 DIAGNOSIS — W050XXA Fall from non-moving wheelchair, initial encounter: Secondary | ICD-10-CM | POA: Insufficient documentation

## 2018-04-01 DIAGNOSIS — S80212A Abrasion, left knee, initial encounter: Secondary | ICD-10-CM | POA: Diagnosis not present

## 2018-04-01 DIAGNOSIS — S0081XA Abrasion of other part of head, initial encounter: Secondary | ICD-10-CM | POA: Diagnosis not present

## 2018-04-01 DIAGNOSIS — S81012A Laceration without foreign body, left knee, initial encounter: Secondary | ICD-10-CM | POA: Diagnosis not present

## 2018-04-01 DIAGNOSIS — M6281 Muscle weakness (generalized): Secondary | ICD-10-CM | POA: Diagnosis not present

## 2018-04-01 NOTE — ED Triage Notes (Addendum)
Pt ems [facility] s/p fall. Per EMS pt fell while transferring from bed to wheelchair. Pt states that he might fell out of wheelchair. Pt has abr to top of head, skin tears to both knees, skin tear to right hand.

## 2018-04-01 NOTE — Discharge Instructions (Addendum)
Please seek medical attention for any high fevers, chest pain, shortness of breath, change in behavior, persistent vomiting, bloody stool or any other new or concerning symptoms.  

## 2018-04-01 NOTE — ED Provider Notes (Signed)
[facility] Emergency Department Provider Note    ____________________________________________   I have reviewed the triage vital signs and the nursing notes.   HISTORY  Chief Complaint Fall   History limited by: Not Limited   HPI Ivan Clark is a 84 y.o. male who presents to the emergency department today after a fall. Patient has history of falls and was seen recently for a fall. Today daughter thinks that the patient was likely leaning forward in his wheelchair when he fell forward. He is apparently supposed to be wearing a seat belt. Patient denies any significant pain anywhere. Denies any recent illness or change in health since last ER visit.   Per medical record review patient has a history of ER visit 4 days ago for fall.   Past Medical History:  Diagnosis Date  . Aneurysm of infrarenal abdominal aorta (HCC) SUPRAILIAC  4CM PER CT 10-20-2010--  STABLE  . Basal cell carcinoma   . Bladder cancer ([place]) RECURRENT  (FIRST DX  2001)   HX TURBT AND CHEMO BLADDER INSTILLATION TX'S  . Frequency of urination   . Hyperlipidemia    unspecified  . Hypertension   . Nocturia   . Orthostatic hypotension   . Parkinson disease ([place])   . Peripheral neuropathy     Patient Active Problem List   Diagnosis Date Noted  . Gait abnormality 02/23/2018  . Cellulitis of left upper extremity 06/21/2017  . Cellulitis of left hand 06/21/2017  . Fall 09/10/2016  . Frequent falls 10/17/2015  . Fracture of femoral neck, right ([place]) 04/14/2013  . Right femoral fracture ([place]) 04/09/2013  . Hyperlipidemia 04/09/2013  . Benign prostatic hyperplasia 04/09/2013  . Closed right hip fracture ([place]) 04/09/2013  . Hip fracture, right ([place]) 04/09/2013  . Femoral neck fracture ([place]) 04/09/2013  . Dysphagia, pharyngeal 03/02/2013  . Cervical dystonia 07/28/2012  . Sialorrhea 07/28/2012  . Personal history of malignant neoplasm of bladder 07/27/2012  . Essential  hypertension 07/27/2012  . Dizziness and giddiness 07/27/2012  . Parkinsonism ([place]) 07/27/2012    Past Surgical History:  Procedure Laterality Date  . APPENDECTOMY  10-20-2010  . CYSTO/ BLADDER BX/ FULGERATION BLADDER TUMOR  10-09-2004  . CYSTO/ RETROGRADE PYELOGRAM/ BLADDER BX'S  07-03-2004  . CYSTOSCOPY WITH BIOPSY  11/28/2011   Procedure: CYSTOSCOPY WITH BIOPSY;  Surgeon: Fredricka Bonine, MD;  Location: [facility];  Service: Urology;  Laterality: N/A;  . HIP ARTHROPLASTY Right 04/09/2013   Procedure: ARTHROPLASTY BIPOLAR HIP;  Surgeon: Augustin Schooling, MD;  Location: [place];  Service: Orthopedics;  Laterality: Right;  . LEFT INGUINAL HERNIA REPAIR  1996  . TONSILLECTOMY AND ADENOIDECTOMY  1938  . TRANSURETHRAL RESECTION OF BLADDER TUMOR  09-11-1999   BLADDER CANCER  . VASECTOMY      Prior to Admission medications   Medication Sig Start Date End Date Taking? Authorizing Provider  acetaminophen (TYLENOL) 500 MG tablet Take 1 tablet (500 mg total) by mouth every 6 (six) hours as needed for mild pain, fever or headache. 06/23/17   Gladstone Lighter, MD  amLODipine (NORVASC) 10 MG tablet Take 10 mg by mouth daily.  02/26/17   [provider]  aspirin EC 81 MG tablet Take 1 tablet (81 mg total) by mouth daily after supper. 04/22/13   Angiulli, Lavon Paganini, PA-C  atorvastatin (LIPITOR) 20 MG tablet Take 1 tablet (20 mg total) by mouth daily. 04/22/13   Angiulli, Lavon Paganini, PA-C  carbidopa-levodopa (SINEMET IR) 25-100 MG tablet  Take 1.5 tablets by mouth 3 (three) times daily. Patient taking differently: Take 1 tablet by mouth 3 (three) times daily.  09/08/17   Dennie Bible, NP  Cholecalciferol (VITAMIN D3) 2000 units TABS Take 2,000 Units by mouth daily.    [provider]  diclofenac sodium (VOLTAREN) 1 % GEL Apply 2 g topically 4 (four) times daily. Massage 2 grams to the back of the neck and shoulders QID for pain, inflammation d/t positioning/  contractures r/t Parkinson's    [provider]  finasteride (PROSCAR) 5 MG tablet Take 5 mg by mouth every evening.  05/22/14   [provider]  fluticasone (FLONASE) 50 MCG/ACT nasal spray Place 1 spray into both nostrils 2 (two) times daily as needed (runny nose/ sneezing).  06/02/16   [provider]  irbesartan (AVAPRO) 150 MG tablet Take 1 tablet (150 mg total) by mouth daily. 06/23/17   Gladstone Lighter, MD  metoprolol succinate (TOPROL-XL) 25 MG 24 hr tablet Take 25 mg by mouth daily.    [provider]  miconazole (MICOTIN) 2 % cream Apply 1 application topically daily. Apply to groin and sacrum    [provider]  naproxen sodium (ALEVE) 220 MG tablet Take 220 mg by mouth daily as needed.    [provider]  nystatin (NYSTATIN) powder Apply 2 g topically 2 (two) times daily as needed (redness/ burning of scrotum).    [provider]  Polyethyl Glycol-Propyl Glycol (SYSTANE) 0.4-0.3 % SOLN Apply 1 application to eye 4 (four) times daily as needed (dry eyes).     [provider]  rasagiline (AZILECT) 1 MG TABS tablet Take 1 tablet (1 mg total) by mouth daily. 09/08/17   Dennie Bible, NP  sennosides-docusate sodium (SENOKOT-S) 8.6-50 MG tablet Take 1 tablet by mouth at bedtime.     [provider]  tamsulosin (FLOMAX) 0.4 MG CAPS capsule Take 1 capsule (0.4 mg total) by mouth daily after supper. 04/22/13   Angiulli, Lavon Paganini, PA-C    Allergies Patient has no known allergies.  Family History  Problem Relation Age of Onset  . Alzheimer's disease Mother   . Heart failure Father   . Kidney cancer Neg Hx   . Prostate cancer Neg Hx   . Basal cell carcinoma Neg Hx   . Melanoma Neg Hx   . Squamous cell carcinoma Neg Hx     Social History Social History   Tobacco Use  . Smoking status: Former Smoker    Packs/day: 2.00    Years: 50.00    Pack years: 100.00    Types: Cigarettes    Last attempt to  quit: 05/13/1999    Years since quitting: 18.8  . Smokeless tobacco: Never Used  Substance Use Topics  . Alcohol use: No    Alcohol/week: 0.0 standard drinks  . Drug use: No    Review of Systems Constitutional: No fever/chills Eyes: No visual changes. ENT: No sore throat. Cardiovascular: Denies chest pain. Respiratory: Denies shortness of breath. Gastrointestinal: No abdominal pain.  No nausea, no vomiting.  No diarrhea.   Genitourinary: Negative for dysuria. Musculoskeletal: Negative for back pain. Skin: Abrasion to top of head, skin tears to knees Neurological: Negative for headaches, focal weakness or numbness.  ____________________________________________   PHYSICAL EXAM:  VITAL SIGNS: ED Triage Vitals [04/01/18 1029]  Enc Vitals Group     BP      Pulse      Resp      Temp  Temp src      SpO2      Weight 167 lb 8.8 oz (76 kg)     Height 5\' 7"  (1.702 m)     Head Circumference      Peak Flow      Pain Score 0     Pain Loc      Pain Edu?      Excl. in [place]?      Constitutional: Alert and oriented.  Eyes: Conjunctivae are normal.  ENT      Head: Normocephalic. Abrasion to top of head      Nose: No congestion/rhinnorhea.      Mouth/Throat: Mucous membranes are moist.      Neck: No stridor. Hematological/Lymphatic/Immunilogical: No cervical lymphadenopathy. Cardiovascular: Normal rate, regular rhythm.  No murmurs, rubs, or gallops.  Respiratory: Normal respiratory effort without tachypnea nor retractions. Breath sounds are clear and equal bilaterally. No wheezes/rales/rhonchi. Gastrointestinal: Soft and non tender. No rebound. No guarding.  Genitourinary: Deferred Musculoskeletal: Normal range of motion in all extremities. No lower extremity edema. Neurologic:  Normal speech and language. No gross focal neurologic deficits are appreciated.  Skin: Abrasion to top of head, skin tears to knees.  Psychiatric: Mood and affect are normal. Speech and behavior are  normal. Patient exhibits appropriate insight and judgment.  ____________________________________________    LABS (pertinent positives/negatives)  None  ____________________________________________   EKG  None  ____________________________________________    RADIOLOGY  CT head/cervical spine No acute findings  ____________________________________________   PROCEDURES  Procedures  ____________________________________________   INITIAL IMPRESSION / ASSESSMENT AND PLAN / ED COURSE  Pertinent labs & imaging results that were available during my care of the patient were reviewed by me and considered in my medical decision making (see chart for details).   Patient presented to the emergency department today after a fall.  Patient with a slight abrasion to the top of his head.  CT head and cervical spine without any acute findings.  Patient with some skin tears over his knees.  Patient family okay with deferring blood work and urine at this time. ____________________________________________   FINAL CLINICAL IMPRESSION(S) / ED DIAGNOSES  Final diagnoses:  Fall, initial encounter  Abrasion     Note: This dictation was prepared with Dragon dictation. Any transcriptional errors that result from this process are unintentional     Nance Pear, MD 04/01/18 1235

## 2018-04-05 DIAGNOSIS — Z79899 Other long term (current) drug therapy: Secondary | ICD-10-CM | POA: Diagnosis not present

## 2018-04-05 DIAGNOSIS — S0990XA Unspecified injury of head, initial encounter: Secondary | ICD-10-CM | POA: Diagnosis not present

## 2018-04-05 DIAGNOSIS — G20 Parkinson's disease: Secondary | ICD-10-CM | POA: Diagnosis not present

## 2018-04-05 DIAGNOSIS — J300 Vasomotor rhinitis: Secondary | ICD-10-CM | POA: Diagnosis not present

## 2018-04-06 DIAGNOSIS — R531 Weakness: Secondary | ICD-10-CM | POA: Diagnosis not present

## 2018-04-06 DIAGNOSIS — R54 Age-related physical debility: Secondary | ICD-10-CM | POA: Diagnosis not present

## 2018-04-06 DIAGNOSIS — R269 Unspecified abnormalities of gait and mobility: Secondary | ICD-10-CM | POA: Diagnosis not present

## 2018-04-06 DIAGNOSIS — M6240 Contracture of muscle, unspecified site: Secondary | ICD-10-CM | POA: Diagnosis not present

## 2018-04-07 ENCOUNTER — Other Ambulatory Visit: Payer: Medicare Other | Admitting: Urology

## 2018-04-12 ENCOUNTER — Ambulatory Visit: Payer: Medicare Other | Admitting: Urology

## 2018-04-12 ENCOUNTER — Encounter: Payer: Self-pay | Admitting: Urology

## 2018-04-12 VITALS — BP 122/56 | HR 56

## 2018-04-12 DIAGNOSIS — F4489 Other dissociative and conversion disorders: Secondary | ICD-10-CM | POA: Diagnosis not present

## 2018-04-12 DIAGNOSIS — G20 Parkinson's disease: Secondary | ICD-10-CM | POA: Diagnosis not present

## 2018-04-12 DIAGNOSIS — Z79899 Other long term (current) drug therapy: Secondary | ICD-10-CM | POA: Diagnosis not present

## 2018-04-12 DIAGNOSIS — Z8551 Personal history of malignant neoplasm of bladder: Secondary | ICD-10-CM | POA: Diagnosis not present

## 2018-04-12 LAB — MICROSCOPIC EXAMINATION: Epithelial Cells (non renal): NONE SEEN /hpf (ref 0–10)

## 2018-04-12 LAB — URINALYSIS, COMPLETE
BILIRUBIN UA: NEGATIVE
GLUCOSE, UA: NEGATIVE
Nitrite, UA: NEGATIVE
UUROB: 0.2 mg/dL (ref 0.2–1.0)
pH, UA: 5.5 (ref 5.0–7.5)

## 2018-04-12 MED ORDER — LIDOCAINE HCL URETHRAL/MUCOSAL 2 % EX GEL
1.0000 "application " | Freq: Once | CUTANEOUS | Status: AC
  Administered 2018-04-12: 1 via URETHRAL

## 2018-04-12 NOTE — Progress Notes (Signed)
Scheduled for cystoscopy today however preprocedure urinalysis shows significant pyuria and microhematuria.  A urine culture was ordered.  The cystoscopy will be rescheduled.

## 2018-04-13 ENCOUNTER — Non-Acute Institutional Stay: Payer: Medicare Other | Admitting: Nurse Practitioner

## 2018-04-13 ENCOUNTER — Encounter: Payer: Self-pay | Admitting: Nurse Practitioner

## 2018-04-13 DIAGNOSIS — M6281 Muscle weakness (generalized): Secondary | ICD-10-CM

## 2018-04-13 DIAGNOSIS — G629 Polyneuropathy, unspecified: Secondary | ICD-10-CM | POA: Insufficient documentation

## 2018-04-13 DIAGNOSIS — R630 Anorexia: Secondary | ICD-10-CM | POA: Insufficient documentation

## 2018-04-13 DIAGNOSIS — R413 Other amnesia: Secondary | ICD-10-CM

## 2018-04-13 DIAGNOSIS — R2681 Unsteadiness on feet: Secondary | ICD-10-CM

## 2018-04-13 DIAGNOSIS — G20A1 Parkinson's disease without dyskinesia, without mention of fluctuations: Secondary | ICD-10-CM

## 2018-04-13 DIAGNOSIS — G20 Parkinson's disease: Secondary | ICD-10-CM

## 2018-04-13 DIAGNOSIS — Z515 Encounter for palliative care: Secondary | ICD-10-CM

## 2018-04-13 NOTE — Progress Notes (Signed)
[facility] Telephone: [phone] Fax: [phone]  PATIENT NAME: Ivan Clark DOB: July 18, 1928 MRN: [id number]  PRIMARY CARE PROVIDER:   Orvis Br[facility], [facility]  REFERRING PROVIDER:  [facility] [address], [place] [address]  RESPONSIBLE PARTY:  [facility] power of attorney Viviana Simpler [phone]   ASSESSMENT:   I visited and observed Ivan Clark. We talked about purpose of palliative medicine visit, Ivan Clark in agreement. Talked about symptoms of pain and shortness of breath which he denies. We talked about recent visits to the emergency department for Falls. We talked about ensuring his seatbelt when he sits in his wheelchair in addition to proper positioning. Discussed fall precautions. We talked about importance of energy conservation, fatigue and overall weakness secondary to Parkinson's disease. We talked about appetite which varies per day and dysphasia which has remained stable at present time. We talked about recent appointment at urologist, currently being treated for urinary tract infection. We talked about chronic disease progression of Parkinson's with expectations as advances. Praised Ivan Clark for continuing to work with therapy for strengthening and balance. Talked about role of palliative medicine and plan of care with medical goals to focus on comfort. At present time he does continue to remain stable. No new changes to goals our current plan of care, staff updated.  I called Viviana Simpler, Mr Clark's daughter Sartell. Talked about purpose for palliative medicine visit in agreement. Talked about palliative medicine visit with Ivan Clark. Clinical update discussed. We talked about neurology appointment. We talked about progression of Parkinson's disease. We talked about his work with therapy, ambulating.  We talked about his visits to the emergency department due to  Falls. When the endorses she does understand that he's continuing to progress in the disease of Parkinson's disease. Abigail Butts talked about his hallucinations worsening, verbalizing that his wife is still alive, which since she has passed. We talked about expectations with progression. Medical goals to continue to focus on Comfort. DNR remains in place. We talked about hospice screening as he previously was screen for hospice. Therapeutic listening and emotional support provided. Questions answered to satisfaction.    RECOMMENDATIONS and PLAN:   1. Memory loss R41.3 secondary to Parkinson's disease. Supportive measures encourage staff to continue putting reminders in his room. Progressive   2. Generalized weakness  M62.81 secondary to Parkinson's disease continue with therapy as able. Encourage energy conservation and rest times.  3. Unsteady gait R26.81 secondary to progression of Parkinson's disease. Continue fall precautions, high-risk. Encourage therapy for balance training. Reinforced with staff importance of call bell. Encouraged Ivan Clark not to get up on his own, encourage staff frequent reminders.  4. Anorexia R63.0. Support encourage him to continue to go to the dining area for meals. Assistance as needed as he continues to feed himself currently though Tremor has slightly improved with recent increase in sinemet. Continue supplements, snacks and weights.  5. Palliative Care Encounter. He continues to be progressive with two pound weight loss in the setting of Parkinson's disease he does continue to appear stable. He continues to be able to ambulate with assistance. Discuss the role of palliative medicine in plan of care. Palliative care to follow up in two months if needed or sooner should he decline. Therapeutic listening and emotional support provided. Questions answered to satisfaction. Abigail Butts in agreement.  I spent 75 minutes providing this consultation,  from 1:15 pm to 2:30pm. More  than 50% of the time  in this consultation was spent coordinating communication.   HISTORY OF PRESENT ILLNESS:  Ivan Clark is a 84 year old male with multiple medical problems including Parkinson's disease, dysphasia, aneurysm of infrarenal abdominal aorta, BPH, bladder cancer s/p turp, chemotherapy bladder instillation treatments, hypertension, cervical dystonia, hyperlipidemia, right hip arthroplasty, left and then linconl hernia repair, appendectomy, tonsillectomy, adenoidectomy, vasectomy. He resides and Assisted Living facility, [facility]. He does require transfers to the wheelchair. He does require positioning to keep him in his chair as he is a high fall risk. He is total ADL assistance. He does attempt to feed himself though has tremors and difficulty using utensils. Staff endorses no recent wounds. He is able to verbalize his needs those staff endorse has he does have more times of getting things confused. 02/23/2018 last seen by palliative csre with goals of care to focus on symptom management pain, decreased appetite and chronic disease progression in the setting of Parkinson's disease. Neurology follow-up appointment 10 / 15 / 2019 resulting in decreased sinemet to one tablet tid, continue azilect 1 mg daily. Next visit six months. Fall 11 / 17 / 2019 resulting in ED visit. CT head spine no acute findings. Skin tears lower extremities. Fall 11 / 21 / 2019 resulting in ED visit. CT head and cervical spine without an acute findings. Skin tears over his knees. Family deferred blood work. 12 / 06/2017 Urology appointment first cystoscopy but was rescheduled due to microhematuria with pyuria. At present time he is sitting in the wheelchair. He has completed walking with assistance, gait belt with Physical Therapy. He appears to debilitated but comfortable. No visitors present. Palliative Care was asked to help address goals of care.   CODE STATUS: DNR  PPS: 40% HOSPICE ELIGIBILITY/DIAGNOSIS:  NO  PAST MEDICAL HISTORY:  Past Medical History:  Diagnosis Date  . Aneurysm of infrarenal abdominal aorta (HCC) SUPRAILIAC  4CM PER CT 10-20-2010--  STABLE  . Anorexia   . Basal cell carcinoma   . Bladder cancer ([place]) RECURRENT  (FIRST DX  2001)   HX TURBT AND CHEMO BLADDER INSTILLATION TX'S  . Frequency of urination   . Hyperlipidemia    unspecified  . Hypertension   . Memory loss   . Muscle weakness (generalized)   . Nocturia   . Orthostatic hypotension   . Palliative care encounter   . Parkinson disease ([place])   . Peripheral neuropathy   . Unsteady gait     SOCIAL HX:  Social History   Tobacco Use  . Smoking status: Former Smoker    Packs/day: 2.00    Years: 50.00    Pack years: 100.00    Types: Cigarettes    Last attempt to quit: 05/13/1999    Years since quitting: 18.9  . Smokeless tobacco: Never Used  Substance Use Topics  . Alcohol use: No    Alcohol/week: 0.0 standard drinks    ALLERGIES: No Known Allergies   PERTINENT MEDICATIONS:  Outpatient Encounter Medications as of 04/13/2018  Medication Sig  . acetaminophen (TYLENOL) 500 MG tablet Take 1 tablet (500 mg total) by mouth every 6 (six) hours as needed for mild pain, fever or headache. (Patient taking differently: Take 500 mg by mouth every 4 (four) hours as needed for fever. )  . amLODipine (NORVASC) 5 MG tablet Take 5 mg by mouth daily.  [name] aspirin 81 MG chewable tablet Chew 81 mg by mouth daily.  [name] atorvastatin (LIPITOR) 20 MG tablet Take 1 tablet (20 mg total) by  mouth daily. (Patient taking differently: Take 20 mg by mouth every evening. )  . azelastine (ASTELIN) 0.1 % nasal spray Place 2 sprays into both nostrils 2 (two) times daily.  . carbidopa-levodopa (SINEMET IR) 25-100 MG tablet Take 1.5 tablets by mouth 3 (three) times daily. (Patient taking differently: Take 1 tablet by mouth 3 (three) times daily. )  . Cholecalciferol (VITAMIN D3) 2000 units TABS Take 2,000 Units by mouth 2 (two) times daily.    . finasteride (PROSCAR) 5 MG tablet Take 5 mg by mouth at bedtime.   . fluticasone (FLONASE) 50 MCG/ACT nasal spray Place 1 spray into both nostrils 2 (two) times daily as needed for allergies or rhinitis.   [name] irbesartan (AVAPRO) 150 MG tablet Take 1 tablet (150 mg total) by mouth daily.  . metoprolol succinate (TOPROL-XL) 25 MG 24 hr tablet Take 25 mg by mouth at bedtime.   . miconazole (MICOTIN) 2 % cream Apply 1 application topically daily. Apply to groin and sacrum  . naproxen sodium (ALEVE) 220 MG tablet Take 220 mg by mouth daily as needed (for pain).   . nystatin (NYSTATIN) powder Apply topically 2 (two) times daily as needed (redness/ burning of scrotum).   [name] nystatin (NYSTATIN) powder Apply topically at bedtime. Apply to groin  . nystatin cream (MYCOSTATIN) Apply 1 application topically 2 (two) times daily. Apply to groin and scrotum  . Polyethyl Glycol-Propyl Glycol (SYSTANE) 0.4-0.3 % SOLN Apply 1 drop to eye 4 (four) times daily as needed (dry eyes).   . polyethylene glycol (MIRALAX / GLYCOLAX) packet Take 17 g by mouth daily.  . rasagiline (AZILECT) 1 MG TABS tablet Take 1 tablet (1 mg total) by mouth daily.  . sennosides-docusate sodium (SENOKOT-S) 8.6-50 MG tablet Take 1 tablet by mouth at bedtime.   . tamsulosin (FLOMAX) 0.4 MG CAPS capsule Take 1 capsule (0.4 mg total) by mouth daily after supper.  . vitamin B-12 (CYANOCOBALAMIN) 1000 MCG tablet Take 1,000 mcg by mouth daily.  . white petrolatum (VASELINE) GEL Apply 1 application topically every other day. To all wounds until healed (during the evening shift)   Facility-Administered Encounter Medications as of 04/13/2018  Medication  . mitomycin (MUTAMYCIN) chemo injection 40 mg    PHYSICAL EXAM:   General: NAD, frail appearing, thin, male pleasant Cardiovascular: regular rate and rhythm Pulmonary: clear ant fields Abdomen: soft, nontender, + bowel sounds Extremities: no edema, no joint deformities, decrease muscle tone  BLE Skin: no rashes Neurological: generalized weakness  Daci Stubbe Z Cameryn Schum, NP

## 2018-04-16 ENCOUNTER — Telehealth: Payer: Self-pay

## 2018-04-16 LAB — CULTURE, URINE COMPREHENSIVE

## 2018-04-16 NOTE — Telephone Encounter (Signed)
-----   Message from Abbie Sons, MD sent at 04/16/2018  1:15 PM EST ----- Urine culture showed no significant infection.  Reschedule cystoscopy.

## 2018-04-16 NOTE — Telephone Encounter (Signed)
Left vm to call back

## 2018-04-19 NOTE — Telephone Encounter (Signed)
Pt returned call, read message from [facility] and resch cysto

## 2018-04-21 ENCOUNTER — Emergency Department
Admission: EM | Admit: 2018-04-21 | Discharge: 2018-04-21 | Disposition: A | Discharge status: Skilled Nursing Facility | Payer: Medicare Other | Attending: Emergency Medicine | Admitting: Emergency Medicine

## 2018-04-21 ENCOUNTER — Encounter: Payer: Self-pay | Admitting: Emergency Medicine

## 2018-04-21 ENCOUNTER — Telehealth: Payer: Self-pay | Admitting: Urology

## 2018-04-21 ENCOUNTER — Other Ambulatory Visit: Payer: Self-pay

## 2018-04-21 DIAGNOSIS — Z79899 Other long term (current) drug therapy: Secondary | ICD-10-CM | POA: Insufficient documentation

## 2018-04-21 DIAGNOSIS — R319 Hematuria, unspecified: Secondary | ICD-10-CM | POA: Diagnosis not present

## 2018-04-21 DIAGNOSIS — I10 Essential (primary) hypertension: Secondary | ICD-10-CM | POA: Diagnosis not present

## 2018-04-21 DIAGNOSIS — N501 Vascular disorders of male genital organs: Secondary | ICD-10-CM | POA: Diagnosis not present

## 2018-04-21 DIAGNOSIS — N390 Urinary tract infection, site not specified: Secondary | ICD-10-CM | POA: Insufficient documentation

## 2018-04-21 DIAGNOSIS — Z87891 Personal history of nicotine dependence: Secondary | ICD-10-CM | POA: Insufficient documentation

## 2018-04-21 DIAGNOSIS — G20 Parkinson's disease: Secondary | ICD-10-CM | POA: Insufficient documentation

## 2018-04-21 DIAGNOSIS — Z8551 Personal history of malignant neoplasm of bladder: Secondary | ICD-10-CM | POA: Insufficient documentation

## 2018-04-21 LAB — BASIC METABOLIC PANEL
Anion gap: 5 (ref 5–15)
BUN: 28 mg/dL — ABNORMAL HIGH (ref 8–23)
CO2: 26 mmol/L (ref 22–32)
Calcium: 8.8 mg/dL — ABNORMAL LOW (ref 8.9–10.3)
Chloride: 107 mmol/L (ref 98–111)
Creatinine, Ser: 0.97 mg/dL (ref 0.61–1.24)
GFR calc non Af Amer: >60 mL/min (ref >60)
Glucose, Bld: 96 mg/dL (ref 70–99)
Potassium: 3.7 mmol/L (ref 3.5–5.1)
Sodium: 138 mmol/L (ref 135–145)

## 2018-04-21 LAB — CBC WITH DIFFERENTIAL/PLATELET
ABS IMMATURE GRANULOCYTES: 0.03 10*3/uL (ref 0.00–0.07)
BASOS ABS: 0.1 10*3/uL (ref 0.0–0.1)
BASOS PCT: 1 %
Eosinophils Absolute: 0.1 10*3/uL (ref 0.0–0.5)
Eosinophils Relative: 2 %
HCT: 39.3 % (ref 39.0–52.0)
Hemoglobin: 12.5 g/dL — ABNORMAL LOW (ref 13.0–17.0)
Immature Granulocytes: 1 %
Lymphocytes Relative: 21 %
Lymphs Abs: 1.4 10*3/uL (ref 0.7–4.0)
MCH: 29.4 pg (ref 26.0–34.0)
MCHC: 31.8 g/dL (ref 30.0–36.0)
MCV: 92.5 fL (ref 80.0–100.0)
Monocytes Absolute: 0.3 10*3/uL (ref 0.1–1.0)
Monocytes Relative: 5 %
Neutro Abs: 4.7 10*3/uL (ref 1.7–7.7)
Neutrophils Relative %: 70 %
Platelets: 195 10*3/uL (ref 150–400)
RBC: 4.25 MIL/uL (ref 4.22–5.81)
RDW: 13.4 % (ref 11.5–15.5)
WBC: 6.6 10*3/uL (ref 4.0–10.5)
nRBC: 0 % (ref 0.0–0.2)

## 2018-04-21 LAB — URINALYSIS, COMPLETE (UACMP) WITH MICROSCOPIC
BILIRUBIN URINE: NEGATIVE
Glucose, UA: NEGATIVE mg/dL
KETONES UR: NEGATIVE mg/dL
Nitrite: NEGATIVE
PH: 6 (ref 5.0–8.0)
Protein, ur: 30 mg/dL — AB
Specific Gravity, Urine: 1.018 (ref 1.005–1.030)

## 2018-04-21 MED ORDER — LIDOCAINE HCL URETHRAL/MUCOSAL 2 % EX GEL
CUTANEOUS | Status: AC
  Administered 2018-04-21: 1 via URETHRAL
  Filled 2018-04-21: qty 10

## 2018-04-21 MED ORDER — IRBESARTAN 150 MG PO TABS
150.0000 mg | ORAL_TABLET | Freq: Once | ORAL | Status: AC
  Administered 2018-04-21: 150 mg via ORAL
  Filled 2018-04-21: qty 1

## 2018-04-21 MED ORDER — SODIUM CHLORIDE 0.9 % IV BOLUS
1000.0000 mL | Freq: Once | INTRAVENOUS | Status: AC
  Administered 2018-04-21: 1000 mL via INTRAVENOUS

## 2018-04-21 MED ORDER — CEPHALEXIN 500 MG PO CAPS: 500.0000 mg | ORAL_CAPSULE | Freq: Three times a day (TID) | ORAL | 0 refills | Status: AC

## 2018-04-21 MED ORDER — DIAZEPAM 5 MG/ML IJ SOLN
5.0000 mg | Freq: Once | INTRAMUSCULAR | Status: AC
  Administered 2018-04-21: 5 mg via INTRAVENOUS
  Filled 2018-04-21: qty 2

## 2018-04-21 MED ORDER — AMLODIPINE BESYLATE 5 MG PO TABS
5.0000 mg | ORAL_TABLET | Freq: Once | ORAL | Status: AC
  Administered 2018-04-21: 5 mg via ORAL
  Filled 2018-04-21: qty 1

## 2018-04-21 MED ORDER — LIDOCAINE HCL URETHRAL/MUCOSAL 2 % EX GEL
1.0000 "application " | Freq: Once | CUTANEOUS | Status: AC
  Administered 2018-04-21: 1 via URETHRAL

## 2018-04-21 MED ORDER — HYDROCODONE-ACETAMINOPHEN 5-325 MG PO TABS: 1.0000 | ORAL_TABLET | Freq: Four times a day (QID) | ORAL | 0 refills | Status: AC | PRN

## 2018-04-21 MED ORDER — SODIUM CHLORIDE 0.9 % IV BOLUS
500.0000 mL | Freq: Once | INTRAVENOUS | Status: AC
  Administered 2018-04-21: 500 mL via INTRAVENOUS

## 2018-04-21 MED ORDER — MORPHINE SULFATE (PF) 4 MG/ML IV SOLN
4.0000 mg | Freq: Once | INTRAVENOUS | Status: AC
  Administered 2018-04-21: 4 mg via INTRAVENOUS
  Filled 2018-04-21: qty 1

## 2018-04-21 MED ORDER — DIAZEPAM 5 MG PO TABS: 5.0000 mg | ORAL_TABLET | Freq: Four times a day (QID) | ORAL | 0 refills | Status: AC | PRN

## 2018-04-21 MED ORDER — SODIUM CHLORIDE 0.9 % IV SOLN
1.0000 g | Freq: Once | INTRAVENOUS | Status: AC
  Administered 2018-04-21: 1 g via INTRAVENOUS
  Filled 2018-04-21: qty 10

## 2018-04-21 NOTE — ED Triage Notes (Signed)
Pt came to ED via [facility] from [facility]. The staff went to get pt ready for the day and noticed that he had blood in his depends. They noticed that he has some blood coming from his penis.

## 2018-04-21 NOTE — ED Notes (Signed)
Pt unable to get into son's truck to be taken to the nursing home. [facility] called and sending Lucianne Lei to take him home at this time. Pt signing pad not working at this time but discharge instructions given to son and pt and he will give them to nursing home staff upon arrival. Pt discharged to lobby at this time sitting with son. NAD. First nurse Tiffany notified of pt waiting pick up.

## 2018-04-21 NOTE — Discharge Instructions (Addendum)
Keep foley in place until you see Dr. Bernardo Heater. Call his office today for appointment. He will remove the foley in the office   Take valium for spasms   Take norco for severe pain   Take keflex three times daily for a week for UTI.   See your primary care doctor regarding your blood pressure   Return to ER if you have gross blood in your foley, severe pain, fever, vomiting.

## 2018-04-21 NOTE — ED Notes (Signed)
Pts urine is now pink, MD aware new orders given. Family update. Pt given OJ at this time. Will continue to monitor. Call light within reach.

## 2018-04-21 NOTE — ED Provider Notes (Signed)
[facility] EMERGENCY DEPARTMENT Provider Note   CSN: [id number] Arrival date & time: 04/21/18  4034     History   Chief Complaint Chief Complaint  Patient presents with  . Hematuria    HPI Ivan Clark is a 84 y.o. male history of bladder cancer status post TURP and chemo, recurrent UTIs here presenting with hematuria.  Patient is from home but does have home care nurse that visits him.  Today the home care nurse came to the house and noticed blood in his diaper.  They initially thought it was hemorrhoids but then noticed blood coming out of his penis and sent in for evaluation.  Patient states that he did not notice any blood in his urine recently.  However he saw urology about 10 days ago and had pyuria and hematuria and finished a course of antibiotic.  He was supposed to get a cystoscopy but that was deferred due to ongoing infection.  Patient does have a history of UTIs.  He denies fever or vomiting or abdominal pain.  Patient had a fall of about 3 weeks ago but denies any new falls.  The history is provided by the patient and the EMS personnel.    Past Medical History:  Diagnosis Date  . Aneurysm of infrarenal abdominal aorta (HCC) SUPRAILIAC  4CM PER CT 10-20-2010--  STABLE  . Anorexia   . Basal cell carcinoma   . Bladder cancer ([place]) RECURRENT  (FIRST DX  2001)   HX TURBT AND CHEMO BLADDER INSTILLATION TX'S  . Frequency of urination   . Hyperlipidemia    unspecified  . Hypertension   . Memory loss   . Muscle weakness (generalized)   . Nocturia   . Orthostatic hypotension   . Palliative care encounter   . Parkinson disease ([place])   . Peripheral neuropathy   . Unsteady gait     Patient Active Problem List   Diagnosis Date Noted  . Unsteady gait   . Palliative care encounter   . Parkinson disease ([place])   . Peripheral neuropathy   . Memory loss   . Anorexia   . Muscle weakness (generalized)   . Gait abnormality 02/23/2018  .  Cellulitis 06/24/2017  . Cellulitis of left upper extremity 06/21/2017  . Cellulitis of left hand 06/21/2017  . Fall 09/10/2016  . Frequent falls 10/17/2015  . Fracture of femoral neck, right ([place]) 04/14/2013  . Right femoral fracture ([place]) 04/09/2013  . Hyperlipidemia 04/09/2013  . Benign prostatic hyperplasia 04/09/2013  . Closed right hip fracture ([place]) 04/09/2013  . Hip fracture, right ([place]) 04/09/2013  . Femoral neck fracture ([place]) 04/09/2013  . Dysphagia, pharyngeal 03/02/2013  . Cervical dystonia 07/28/2012  . Sialorrhea 07/28/2012  . Personal history of malignant neoplasm of bladder 07/27/2012  . Essential hypertension 07/27/2012  . Dizziness and giddiness 07/27/2012  . Parkinsonism ([place]) 07/27/2012    Past Surgical History:  Procedure Laterality Date  . APPENDECTOMY  10-20-2010  . CYSTO/ BLADDER BX/ FULGERATION BLADDER TUMOR  10-09-2004  . CYSTO/ RETROGRADE PYELOGRAM/ BLADDER BX'S  07-03-2004  . CYSTOSCOPY WITH BIOPSY  11/28/2011   Procedure: CYSTOSCOPY WITH BIOPSY;  Surgeon: Fredricka Bonine, MD;  Location: [facility];  Service: Urology;  Laterality: N/A;  . HIP ARTHROPLASTY Right 04/09/2013   Procedure: ARTHROPLASTY BIPOLAR HIP;  Surgeon: Augustin Schooling, MD;  Location: [facility];  Service: Orthopedics;  Laterality: Right;  . LEFT INGUINAL HERNIA REPAIR  1996  . TONSILLECTOMY AND ADENOIDECTOMY  [place] OF BLADDER TUMOR  09-11-1999   BLADDER CANCER  . VASECTOMY          Home Medications    Prior to Admission medications   Medication Sig Start Date End Date Taking? Authorizing Provider  acetaminophen (TYLENOL) 500 MG tablet Take 1 tablet (500 mg total) by mouth every 6 (six) hours as needed for mild pain, fever or headache. Patient taking differently: Take 500 mg by mouth every 4 (four) hours as needed for fever.  06/23/17  Yes Gladstone Lighter, MD  amLODipine (NORVASC) 5 MG tablet Take 5 mg by mouth daily.   Yes  [provider]  aspirin 81 MG chewable tablet Chew 81 mg by mouth daily.   Yes [provider]  atorvastatin (LIPITOR) 20 MG tablet Take 1 tablet (20 mg total) by mouth daily. Patient taking differently: Take 20 mg by mouth every evening.  04/22/13  Yes Angiulli, Lavon Paganini, PA-C  azelastine (ASTELIN) 0.1 % nasal spray Place 2 sprays into both nostrils 2 (two) times daily. 03/30/18  Yes [provider]  carbidopa-levodopa (SINEMET IR) 25-100 MG tablet Take 1.5 tablets by mouth 3 (three) times daily. Patient taking differently: Take 1 tablet by mouth 3 (three) times daily.  09/08/17  Yes Dennie Bible, NP  Cholecalciferol (VITAMIN D3) 2000 units TABS Take 2,000 Units by mouth 2 (two) times daily.    Yes [provider]  finasteride (PROSCAR) 5 MG tablet Take 5 mg by mouth at bedtime.    Yes [provider]  fluticasone (FLONASE) 50 MCG/ACT nasal spray Place 1 spray into both nostrils 2 (two) times daily as needed for allergies or rhinitis.    Yes [provider]  irbesartan (AVAPRO) 150 MG tablet Take 1 tablet (150 mg total) by mouth daily. 06/23/17  Yes Gladstone Lighter, MD  metoprolol succinate (TOPROL-XL) 25 MG 24 hr tablet Take 25 mg by mouth at bedtime.    Yes [provider]  naproxen sodium (ALEVE) 220 MG tablet Take 220 mg by mouth daily as needed (for pain).    Yes [provider]  nystatin (NYSTATIN) powder Apply topically 2 (two) times daily as needed (redness/ burning of scrotum).    Yes [provider]  Polyethyl Glycol-Propyl Glycol (SYSTANE) 0.4-0.3 % SOLN Apply 1 drop to eye 4 (four) times daily as needed (dry eyes).    Yes [provider]  polyethylene glycol (MIRALAX / GLYCOLAX) packet Take 17 g by mouth daily.   Yes [provider]  rasagiline (AZILECT) 1 MG TABS tablet Take 1 tablet (1 mg total) by mouth daily. 09/08/17  Yes Dennie Bible, NP  sennosides-docusate sodium  (SENOKOT-S) 8.6-50 MG tablet Take 1 tablet by mouth at bedtime.    Yes [provider]  tamsulosin (FLOMAX) 0.4 MG CAPS capsule Take 1 capsule (0.4 mg total) by mouth daily after supper. 04/22/13  Yes Angiulli, Lavon Paganini, PA-C  vitamin B-12 (CYANOCOBALAMIN) 1000 MCG tablet Take 1,000 mcg by mouth daily.   Yes [provider]  white petrolatum (VASELINE) GEL Apply 1 application topically every other day. To all wounds until healed (during the evening shift)   Yes [provider]  miconazole (MICOTIN) 2 % cream Apply 1 application topically daily. Apply to groin and sacrum    [provider]    Family History Family History  Problem Relation Age of Onset  . Alzheimer's disease Mother   . Heart failure Father   .  Kidney cancer Neg Hx   . Prostate cancer Neg Hx   . Basal cell carcinoma Neg Hx   . Melanoma Neg Hx   . Squamous cell carcinoma Neg Hx     Social History Social History   Tobacco Use  . Smoking status: Former Smoker    Packs/day: 2.00    Years: 50.00    Pack years: 100.00    Types: Cigarettes    Last attempt to quit: 05/13/1999    Years since quitting: 18.9  . Smokeless tobacco: Never Used  Substance Use Topics  . Alcohol use: No    Alcohol/week: 0.0 standard drinks  . Drug use: No     Allergies   Patient has no known allergies.   Review of Systems Review of Systems  Genitourinary: Positive for hematuria.  All other systems reviewed and are negative.    Physical Exam Updated Vital Signs BP (!) 183/69   Pulse 79   Temp 98.2 F (36.8 C) (Oral)   Resp 16   Ht 5\' 7"  (1.702 m)   Wt 68 kg   SpO2 97%   BMI 23.49 kg/m   Physical Exam  Constitutional: He is oriented to person, place, and time.  Chronically ill, NAD   HENT:  Head: Normocephalic.  Mouth/Throat: Oropharynx is clear and moist.  Eyes: Pupils are equal, round, and reactive to light. Conjunctivae and EOM are normal.  Neck: Normal range of motion. Neck supple.   Cardiovascular: Normal rate, regular rhythm and normal heart sounds.  Pulmonary/Chest: Effort normal and breath sounds normal. No stridor. No respiratory distress.  Abdominal: Soft. Bowel sounds are normal. He exhibits no distension. There is no tenderness.  Genitourinary:  Genitourinary Comments: Blood at the meatus. No obvious ballanitis or trauma to penis. No scrotal tenderness   Musculoskeletal: Normal range of motion.  Neurological: He is alert and oriented to person, place, and time. No cranial nerve deficit. Coordination normal.  Skin: Skin is warm. Capillary refill takes less than 2 seconds.  Psychiatric: He has a normal mood and affect.  Nursing note and vitals reviewed.    ED Treatments / Results  Labs (all labs ordered are listed, but only abnormal results are displayed) Labs Reviewed  CBC WITH DIFFERENTIAL/PLATELET - Abnormal; Notable for the following components:      Result Value   Hemoglobin 12.5 (*)    All other components within normal limits  BASIC METABOLIC PANEL - Abnormal; Notable for the following components:   BUN 28 (*)    Calcium 8.8 (*)    All other components within normal limits  URINALYSIS, COMPLETE (UACMP) WITH MICROSCOPIC - Abnormal; Notable for the following components:   Color, Urine YELLOW (*)    APPearance CLEAR (*)    Hgb urine dipstick MODERATE (*)    Protein, ur 30 (*)    Leukocytes, UA TRACE (*)    Bacteria, UA RARE (*)    All other components within normal limits  URINE CULTURE    EKG None  Radiology No results found.  Procedures Procedures (including critical care time)  Bladder irrigation I irrigated catheter with 500 cc NS. Initially blood tinged urine came out. Then it cleared up.   Medications Ordered in ED Medications  lidocaine (XYLOCAINE) 2 % jelly 1 application (1 application Urethral Given 04/21/18 0843)  amLODipine (NORVASC) tablet 5 mg (5 mg Oral Given 04/21/18 0935)  irbesartan (AVAPRO) tablet 150 mg (150 mg  Oral Given 04/21/18 0935)  morphine 4 MG/ML injection 4 mg (  4 mg Intravenous Given 04/21/18 0935)  sodium chloride 0.9 % bolus 500 mL (0 mLs Intravenous Stopped 04/21/18 1042)  cefTRIAXone (ROCEPHIN) 1 g in sodium chloride 0.9 % 100 mL IVPB (0 g Intravenous Stopped 04/21/18 1042)  sodium chloride 0.9 % bolus 1,000 mL (0 mLs Intravenous Stopped 04/21/18 1337)  diazepam (VALIUM) injection 5 mg (5 mg Intravenous Given 04/21/18 1242)     Initial Impression / Assessment and Plan / ED Course  I have reviewed the triage vital signs and the nursing notes.  Pertinent labs & imaging results that were available during my care of the patient were reviewed by me and considered in my medical decision making (see chart for details).    Ivan Clark is a 84 y.o. male here with hematuria. Has hx of bladder cancer s/p TURP and chemo. Had recent UTI and is supposed to get cystoscopy for ongoing microscopic hematuria. I am concerned for possible recurrent bladder mass now causing gross hematuria vs recurrent UTI. Will place 3 way foley in case he needs irrigation and CBI. Will check labs, UA, urine culture.   11 am Patient had cramps after foley placed. Now has some hematuria. I irrigated catheter and no clots observed. Will give IVF and reassess.   2:05 PM The foley remained clear with no obvious clots. I irrigated catheter gain. Cramping controlled with pain meds, valium. Of note, patient was hypertensive but has hx of HTN and didn't take his BP meds. Given oral BP meds and BP down to 180s from 200s. UA + UTI so was given rocephin. Plan to leave foley in place and have him follow up with his urologist (Dr. Bernardo Heater) in several days for foley removal, possible cystoscopy     Final Clinical Impressions(s) / ED Diagnoses   Final diagnoses:  None    ED Discharge Orders    None       Drenda Freeze, MD 04/21/18 1407

## 2018-04-21 NOTE — Telephone Encounter (Signed)
error 

## 2018-04-22 ENCOUNTER — Ambulatory Visit: Payer: Medicare Other | Admitting: Urology

## 2018-04-22 VITALS — BP 136/64 | HR 70

## 2018-04-22 DIAGNOSIS — Z8551 Personal history of malignant neoplasm of bladder: Secondary | ICD-10-CM

## 2018-04-22 DIAGNOSIS — N368 Other specified disorders of urethra: Secondary | ICD-10-CM

## 2018-04-22 DIAGNOSIS — R319 Hematuria, unspecified: Secondary | ICD-10-CM | POA: Diagnosis not present

## 2018-04-22 MED ORDER — LIDOCAINE HCL URETHRAL/MUCOSAL 2 % EX GEL
1.0000 "application " | Freq: Once | CUTANEOUS | Status: AC
  Administered 2018-04-22: 1 via URETHRAL

## 2018-04-22 NOTE — Progress Notes (Signed)
Catheter Removal  Patient is present today for a catheter removal.  58ml of water was drained from the balloon. A 20FR foley cath was removed from the bladder no complications were noted . Patient tolerated well.  Preformed by: Gordy Clement, [place] ([place])

## 2018-04-22 NOTE — Progress Notes (Signed)
   04/22/18  CC:  Chief Complaint  Patient presents with  . Cysto    HPI: 1) Bladder Ca - Jul 2013 LGTa on bbx with [facility] Prior 2001 resection, 2006 resection - treated with BCG.  Last upper tract: Abd U/S Sept 2010 Last cystoscopy: Mar 2016  2) BPH - patient on combination therapy tamsulosin and finasteride. He tried Avodart, doxazosin and Rapaflo in the past. Neurogenic risk includes Parkinson's.  He was scheduled for cystoscopy in early December and the procedure was postponed secondary to pyuria/microhematuria.  Urine culture grew lactobacillus.  He was noted to have urethral bleeding yesterday and was transported to the ED. He was noted to have blood at the urethral meatus but no active bleeding.  A Foley catheter was placed and the initial urine was noted to be blood-tinged then cleared.  His catheter was left indwelling with recommendations of urologic follow-up.  Urine today was clear and his catheter was removed.  Blood pressure 136/64, pulse 70. NED. A&Ox3.   No respiratory distress     Cystoscopy Procedure Note  Patient identification was confirmed, informed consent was obtained, and patient was prepped using Betadine solution.  Lidocaine jelly was administered per urethral meatus.     Pre-Procedure: - Inspection reveals a normal caliber ureteral meatus.  Procedure: The flexible cystoscope was introduced without difficulty - No urethral strictures/lesions are present. - Lateral lobe enlargement with hypervascularity and clot present in the prostatic urethra.  No active bleeding. - Mild elevation bladder neck - Bilateral ureteral orifices identified - Bladder mucosa  reveals no solid or papillary lesions.  Erythema bladder base most likely secondary to indwelling Foley. - No bladder stones -Moderate trabeculation  Retroflexion shows oozing from prostatic urethra   Post-Procedure: - Patient tolerated the procedure well  Assessment/ Plan: No evidence of  recurrent bladder tumor.  Urine cytology was sent and they will be notified with the results.  He was not in urinary retention and his Foley catheter was not replaced.  Follow-up will be determined after review of his urine cytology.   Abbie Sons, MD

## 2018-04-25 LAB — URINE CULTURE: Culture: 60000 — AB

## 2018-04-26 DIAGNOSIS — I10 Essential (primary) hypertension: Secondary | ICD-10-CM | POA: Diagnosis not present

## 2018-04-26 DIAGNOSIS — N390 Urinary tract infection, site not specified: Secondary | ICD-10-CM | POA: Diagnosis not present

## 2018-04-26 DIAGNOSIS — S91209A Unspecified open wound of unspecified toe(s) with damage to nail, initial encounter: Secondary | ICD-10-CM | POA: Diagnosis not present

## 2018-04-26 DIAGNOSIS — N401 Enlarged prostate with lower urinary tract symptoms: Secondary | ICD-10-CM | POA: Diagnosis not present

## 2018-04-28 ENCOUNTER — Telehealth: Payer: Self-pay | Admitting: Urology

## 2018-04-28 ENCOUNTER — Other Ambulatory Visit: Payer: Self-pay | Admitting: Urology

## 2018-04-28 NOTE — Telephone Encounter (Signed)
Pt's daughter, Viviana Simpler [phone] called asking about Dad's test results that were sent off.  Please give her a call.

## 2018-04-29 NOTE — Telephone Encounter (Signed)
Dr. Bernardo Heater,  Pt's daughter wanted to know if you reviewed pt's cytology report

## 2018-04-29 NOTE — Telephone Encounter (Signed)
Urine cytology showed no abnormal cells.  Recommend follow-up cystoscopy 1 year

## 2018-04-29 NOTE — Telephone Encounter (Signed)
Spoke with Ivan Clark (on DPR) and informed her of results per [facility]. She understood and had no additional questions at this time.

## 2018-04-30 NOTE — Telephone Encounter (Signed)
App made ° ° °Michelle  °

## 2018-05-06 DIAGNOSIS — R54 Age-related physical debility: Secondary | ICD-10-CM | POA: Diagnosis not present

## 2018-05-06 DIAGNOSIS — R269 Unspecified abnormalities of gait and mobility: Secondary | ICD-10-CM | POA: Diagnosis not present

## 2018-05-06 DIAGNOSIS — M6240 Contracture of muscle, unspecified site: Secondary | ICD-10-CM | POA: Diagnosis not present

## 2018-05-06 DIAGNOSIS — R531 Weakness: Secondary | ICD-10-CM | POA: Diagnosis not present

## 2018-05-18 ENCOUNTER — Other Ambulatory Visit: Payer: Medicare Other | Admitting: Urology

## 2018-05-19 DIAGNOSIS — N401 Enlarged prostate with lower urinary tract symptoms: Secondary | ICD-10-CM | POA: Diagnosis not present

## 2018-05-19 DIAGNOSIS — G20 Parkinson's disease: Secondary | ICD-10-CM | POA: Diagnosis not present

## 2018-05-19 DIAGNOSIS — E785 Hyperlipidemia, unspecified: Secondary | ICD-10-CM | POA: Diagnosis not present

## 2018-05-19 DIAGNOSIS — I714 Abdominal aortic aneurysm, without rupture: Secondary | ICD-10-CM | POA: Diagnosis not present

## 2018-05-19 DIAGNOSIS — Z87891 Personal history of nicotine dependence: Secondary | ICD-10-CM | POA: Diagnosis not present

## 2018-05-19 DIAGNOSIS — Z9181 History of falling: Secondary | ICD-10-CM | POA: Diagnosis not present

## 2018-05-19 DIAGNOSIS — R351 Nocturia: Secondary | ICD-10-CM | POA: Diagnosis not present

## 2018-05-19 DIAGNOSIS — Z87440 Personal history of urinary (tract) infections: Secondary | ICD-10-CM | POA: Diagnosis not present

## 2018-05-19 DIAGNOSIS — G629 Polyneuropathy, unspecified: Secondary | ICD-10-CM | POA: Diagnosis not present

## 2018-05-19 DIAGNOSIS — Z96641 Presence of right artificial hip joint: Secondary | ICD-10-CM | POA: Diagnosis not present

## 2018-05-19 DIAGNOSIS — Z85828 Personal history of other malignant neoplasm of skin: Secondary | ICD-10-CM | POA: Diagnosis not present

## 2018-05-19 DIAGNOSIS — I951 Orthostatic hypotension: Secondary | ICD-10-CM | POA: Diagnosis not present

## 2018-05-19 DIAGNOSIS — Z8551 Personal history of malignant neoplasm of bladder: Secondary | ICD-10-CM | POA: Diagnosis not present

## 2018-05-19 DIAGNOSIS — I10 Essential (primary) hypertension: Secondary | ICD-10-CM | POA: Diagnosis not present

## 2018-05-20 DIAGNOSIS — Z79899 Other long term (current) drug therapy: Secondary | ICD-10-CM | POA: Diagnosis not present

## 2018-05-28 DIAGNOSIS — F4489 Other dissociative and conversion disorders: Secondary | ICD-10-CM | POA: Diagnosis not present

## 2018-05-28 DIAGNOSIS — N390 Urinary tract infection, site not specified: Secondary | ICD-10-CM | POA: Diagnosis not present

## 2018-06-02 DIAGNOSIS — N390 Urinary tract infection, site not specified: Secondary | ICD-10-CM | POA: Diagnosis not present

## 2018-06-02 DIAGNOSIS — R0989 Other specified symptoms and signs involving the circulatory and respiratory systems: Secondary | ICD-10-CM | POA: Diagnosis not present

## 2018-06-02 DIAGNOSIS — N401 Enlarged prostate with lower urinary tract symptoms: Secondary | ICD-10-CM | POA: Diagnosis not present

## 2018-06-02 DIAGNOSIS — G20 Parkinson's disease: Secondary | ICD-10-CM | POA: Diagnosis not present

## 2018-06-06 DIAGNOSIS — R54 Age-related physical debility: Secondary | ICD-10-CM | POA: Diagnosis not present

## 2018-06-06 DIAGNOSIS — R269 Unspecified abnormalities of gait and mobility: Secondary | ICD-10-CM | POA: Diagnosis not present

## 2018-06-06 DIAGNOSIS — M6240 Contracture of muscle, unspecified site: Secondary | ICD-10-CM | POA: Diagnosis not present

## 2018-06-06 DIAGNOSIS — R531 Weakness: Secondary | ICD-10-CM | POA: Diagnosis not present

## 2018-06-07 DIAGNOSIS — R0989 Other specified symptoms and signs involving the circulatory and respiratory systems: Secondary | ICD-10-CM | POA: Diagnosis not present

## 2018-06-07 DIAGNOSIS — G20 Parkinson's disease: Secondary | ICD-10-CM | POA: Diagnosis not present

## 2018-06-07 DIAGNOSIS — N401 Enlarged prostate with lower urinary tract symptoms: Secondary | ICD-10-CM | POA: Diagnosis not present

## 2018-06-07 DIAGNOSIS — Z79899 Other long term (current) drug therapy: Secondary | ICD-10-CM | POA: Diagnosis not present

## 2018-06-08 ENCOUNTER — Non-Acute Institutional Stay: Payer: Medicare Other | Admitting: Nurse Practitioner

## 2018-06-08 ENCOUNTER — Encounter: Payer: Self-pay | Admitting: Nurse Practitioner

## 2018-06-08 VITALS — HR 78 | Resp 18 | Wt 136.5 lb

## 2018-06-08 DIAGNOSIS — M6281 Muscle weakness (generalized): Secondary | ICD-10-CM | POA: Diagnosis not present

## 2018-06-08 DIAGNOSIS — R630 Anorexia: Secondary | ICD-10-CM | POA: Diagnosis not present

## 2018-06-08 DIAGNOSIS — Z515 Encounter for palliative care: Secondary | ICD-10-CM

## 2018-06-08 DIAGNOSIS — R2681 Unsteadiness on feet: Secondary | ICD-10-CM | POA: Diagnosis not present

## 2018-06-08 NOTE — Progress Notes (Signed)
[facility] Telephone: [phone] Fax: [phone]  PATIENT NAME: Ivan Clark DOB: 1928-06-18 MRN: [id number]  PRIMARY CARE PROVIDER:   Orvis Brill, [facility]  REFERRING PROVIDER:  [facility] [address], [address] [address]  RESPONSIBLE PARTY:   Daughter [facility] power of attorney Viviana Simpler [phone]   RECOMMENDATIONS and PLAN:  1. Memory loss R41.3 secondary to Parkinson's disease. Supportive measures encourage staff to continue putting reminders in his room. Progressive   2. Generalized weaknessM62.81 secondary to Parkinson's disease continue with therapy as able. Encourage energy conservation and rest times.  3. Unsteady gait R26.81 secondary to progression of Parkinson's disease. Continue fall precautions, high-risk. Encourage therapy for balance training. Reinforced with staff importance of call bell. Encouraged Mr. Zinni not to get up on his own, encourage staff frequent reminders.  4. Anorexia R63.0. noted weight loss but ?accuracy of weight. Will have re-weigh. Support encourage him to continue to go to the dining area for meals. Assistance as needed as he continues to feed himself currently though Tremor has slightly improved with recent increase in sinemet. Continue supplements, snacks and weights.  5. Palliative Care Encounter. He continues to be progressive with two pound weight loss in the setting of Parkinson's disease he does continue to appear stable. He continues to be able to ambulate with assistance. Discuss the role of palliative medicine in plan of care. Palliative care to follow up in two months if needed or sooner should he decline. Therapeutic listening and emotional support provided. Questions answered to satisfaction. Abigail Butts in agreement.  ASSESSMENT:    I visited and observed Ivan Clark. We talked about purpose for palliative care visit. We talked about how his day was. We talked  about symptoms of pain which he denies. We talked about his appetite. We talked about his functional abilities to include ambulating. He does ambulate with moderate to maximum assist and a walker. We talked about safety with his seatbelt in his wheelchair. We talked about recent fall. He did make eye contact throughout palliative care visit and was cooperative with the assessment. Emotional support provided. DNR remains in place. He does appear to have a decline in his speech with more difficulty to understand what he's trying to say. Asked if it was okay to contact his daughter Abigail Butts and Mr. Rouillard was in agreement.  I called Abigail Butts, Mr. Nessel's daughter. Clinical update discuss. We talked about symptoms, appetite and weight loss. We talked about the option of supplements such as ensure to help with increasing calories. Will check to see if facility has availability of a dietitian. We talked about chronic disease progression of Parkinson's. We talked about the fall. We talked about physical therapy and his functional abilities. We talked about his functional-level. We talked about the slow changes in his speech. Discuss that present time he does appear to be stable despite weight loss so questionable weights. Will have staff re-weigh with more frequent monitoring. Medical calls to remain with focus on [facility]. DNR does remain in place. Discuss will follow up them for weeks if needed or sooner should he declined. Abigail Butts in agreement. I updated staff.   04/13/2018 weight 150 lb (68 kg) 05/19/2017 weight 136.5 lb  I spent 75 minutes providing this consultation,  from 9:15am to 10:30am. More than 50% of the time in this consultation was spent coordinating communication.   HISTORY OF PRESENT ILLNESS:  Ivan Clark is a 85 y.o. year old male with multiple medical  problems including Parkinson's disease, dysphasia, aneurysm of infrarenal abdominal aorta, BPH, bladder cancer s/p turp, chemotherapy bladder  instillation treatments, hypertension, cervical dystonia, hyperlipidemia, right hip arthroplasty, hernia repair, appendectomy, tonsillectomy, adenoidectomy, vasectomy. He resides and Assisted Living facility, [facility]. He does require transfers to the wheelchair. He does require positioning to keep him in his chair as he is a high fall risk. He is total ADL assistance. He does attempt to feed himself though has tremors and difficulty using utensils. Palliative Care was asked to help address goals of care.   CODE STATUS: DNR  PPS: 40% HOSPICE ELIGIBILITY/DIAGNOSIS: TBD  PAST MEDICAL HISTORY:  Past Medical History:  Diagnosis Date  . Aneurysm of infrarenal abdominal aorta (HCC) SUPRAILIAC  4CM PER CT 10-20-2010--  STABLE  . Anorexia   . Basal cell carcinoma   . Bladder cancer ([place]) RECURRENT  (FIRST DX  2001)   HX TURBT AND CHEMO BLADDER INSTILLATION TX'S  . Frequency of urination   . Hyperlipidemia    unspecified  . Hypertension   . Memory loss   . Muscle weakness (generalized)   . Nocturia   . Orthostatic hypotension   . Palliative care encounter   . Parkinson disease ([place])   . Peripheral neuropathy   . Unsteady gait     SOCIAL HX:  Social History   Tobacco Use  . Smoking status: Former Smoker    Packs/day: 2.00    Years: 50.00    Pack years: 100.00    Types: Cigarettes    Last attempt to quit: 05/13/1999    Years since quitting: 19.0  . Smokeless tobacco: Never Used  Substance Use Topics  . Alcohol use: No    Alcohol/week: 0.0 standard drinks    ALLERGIES: No Known Allergies   PERTINENT MEDICATIONS:  Outpatient Encounter Medications as of 06/08/2018  Medication Sig  . acetaminophen (TYLENOL) 500 MG tablet Take 1 tablet (500 mg total) by mouth every 6 (six) hours as needed for mild pain, fever or headache. (Patient taking differently: Take 500 mg by mouth every 4 (four) hours as needed for fever. )  . amLODipine (NORVASC) 5 MG tablet Take 5 mg by mouth daily.  [name]  aspirin 81 MG chewable tablet Chew 81 mg by mouth daily.  [name] atorvastatin (LIPITOR) 20 MG tablet Take 1 tablet (20 mg total) by mouth daily. (Patient taking differently: Take 20 mg by mouth every evening. )  . azelastine (ASTELIN) 0.1 % nasal spray Place 2 sprays into both nostrils 2 (two) times daily.  . carbidopa-levodopa (SINEMET IR) 25-100 MG tablet Take 1.5 tablets by mouth 3 (three) times daily. (Patient taking differently: Take 1 tablet by mouth 3 (three) times daily. )  . Cholecalciferol (VITAMIN D3) 2000 units TABS Take 2,000 Units by mouth 2 (two) times daily.   . diazepam (VALIUM) 5 MG tablet Take 1 tablet (5 mg total) by mouth every 6 (six) hours as needed for anxiety (spasms).  . finasteride (PROSCAR) 5 MG tablet Take 5 mg by mouth at bedtime.   . fluticasone (FLONASE) 50 MCG/ACT nasal spray Place 1 spray into both nostrils 2 (two) times daily as needed for allergies or rhinitis.   [name] HYDROcodone-acetaminophen (NORCO/VICODIN) 5-325 MG tablet Take 1 tablet by mouth every 6 (six) hours as needed for moderate pain.  [name] irbesartan (AVAPRO) 150 MG tablet Take 1 tablet (150 mg total) by mouth daily.  . metoprolol succinate (TOPROL-XL) 25 MG 24 hr tablet Take 25 mg by mouth at bedtime.   [name]  miconazole (MICOTIN) 2 % cream Apply 1 application topically daily. Apply to groin and sacrum  . naproxen sodium (ALEVE) 220 MG tablet Take 220 mg by mouth daily as needed (for pain).   . nystatin (NYSTATIN) powder Apply topically 2 (two) times daily as needed (redness/ burning of scrotum).   Vladimir Faster Glycol-Propyl Glycol (SYSTANE) 0.4-0.3 % SOLN Apply 1 drop to eye 4 (four) times daily as needed (dry eyes).   . polyethylene glycol (MIRALAX / GLYCOLAX) packet Take 17 g by mouth daily.  . rasagiline (AZILECT) 1 MG TABS tablet Take 1 tablet (1 mg total) by mouth daily.  . sennosides-docusate sodium (SENOKOT-S) 8.6-50 MG tablet Take 1 tablet by mouth at bedtime.   . tamsulosin (FLOMAX) 0.4 MG CAPS capsule Take  1 capsule (0.4 mg total) by mouth daily after supper.  . vitamin B-12 (CYANOCOBALAMIN) 1000 MCG tablet Take 1,000 mcg by mouth daily.  . white petrolatum (VASELINE) GEL Apply 1 application topically every other day. To all wounds until healed (during the evening shift)   Facility-Administered Encounter Medications as of 06/08/2018  Medication  . mitomycin (MUTAMYCIN) chemo injection 40 mg    PHYSICAL EXAM:   General: NAD, frail appearing, thin, severe debilitated pleasant male Cardiovascular: regular rate and rhythm Pulmonary: clear ant fields Abdomen: soft, nontender, + bowel sounds GU: no suprapubic tenderness Extremities: no edema, no joint deformities Skin: no rashes Neurological: Weakness but otherwise nonfocal/unsteady gait  Dayna Geurts Z Lashaun Poch, NP

## 2018-06-20 ENCOUNTER — Emergency Department
Admission: EM | Admit: 2018-06-20 | Discharge: 2018-06-20 | Disposition: A | Discharge status: Home / Self Care | Payer: Medicare Other | Attending: Emergency Medicine | Admitting: Emergency Medicine

## 2018-06-20 ENCOUNTER — Emergency Department: Payer: Medicare Other

## 2018-06-20 ENCOUNTER — Other Ambulatory Visit: Payer: Self-pay

## 2018-06-20 DIAGNOSIS — Y998 Other external cause status: Secondary | ICD-10-CM | POA: Insufficient documentation

## 2018-06-20 DIAGNOSIS — Z79899 Other long term (current) drug therapy: Secondary | ICD-10-CM | POA: Diagnosis not present

## 2018-06-20 DIAGNOSIS — S0001XA Abrasion of scalp, initial encounter: Secondary | ICD-10-CM | POA: Insufficient documentation

## 2018-06-20 DIAGNOSIS — Y9389 Activity, other specified: Secondary | ICD-10-CM | POA: Insufficient documentation

## 2018-06-20 DIAGNOSIS — Y9289 Other specified places as the place of occurrence of the external cause: Secondary | ICD-10-CM | POA: Diagnosis not present

## 2018-06-20 DIAGNOSIS — M25561 Pain in right knee: Secondary | ICD-10-CM | POA: Diagnosis not present

## 2018-06-20 DIAGNOSIS — G20 Parkinson's disease: Secondary | ICD-10-CM | POA: Insufficient documentation

## 2018-06-20 DIAGNOSIS — R58 Hemorrhage, not elsewhere classified: Secondary | ICD-10-CM | POA: Diagnosis not present

## 2018-06-20 DIAGNOSIS — W19XXXA Unspecified fall, initial encounter: Secondary | ICD-10-CM | POA: Insufficient documentation

## 2018-06-20 DIAGNOSIS — S0990XA Unspecified injury of head, initial encounter: Secondary | ICD-10-CM | POA: Insufficient documentation

## 2018-06-20 DIAGNOSIS — Z87891 Personal history of nicotine dependence: Secondary | ICD-10-CM | POA: Insufficient documentation

## 2018-06-20 DIAGNOSIS — M7989 Other specified soft tissue disorders: Secondary | ICD-10-CM | POA: Diagnosis not present

## 2018-06-20 DIAGNOSIS — I10 Essential (primary) hypertension: Secondary | ICD-10-CM | POA: Diagnosis not present

## 2018-06-20 DIAGNOSIS — N3000 Acute cystitis without hematuria: Secondary | ICD-10-CM | POA: Insufficient documentation

## 2018-06-20 DIAGNOSIS — I714 Abdominal aortic aneurysm, without rupture: Secondary | ICD-10-CM | POA: Insufficient documentation

## 2018-06-20 DIAGNOSIS — S80211A Abrasion, right knee, initial encounter: Secondary | ICD-10-CM | POA: Diagnosis not present

## 2018-06-20 DIAGNOSIS — R52 Pain, unspecified: Secondary | ICD-10-CM | POA: Diagnosis not present

## 2018-06-20 DIAGNOSIS — T148XXA Other injury of unspecified body region, initial encounter: Secondary | ICD-10-CM

## 2018-06-20 DIAGNOSIS — S8991XA Unspecified injury of right lower leg, initial encounter: Secondary | ICD-10-CM | POA: Diagnosis not present

## 2018-06-20 DIAGNOSIS — S60512A Abrasion of left hand, initial encounter: Secondary | ICD-10-CM | POA: Diagnosis not present

## 2018-06-20 DIAGNOSIS — S9002XA Contusion of left ankle, initial encounter: Secondary | ICD-10-CM | POA: Diagnosis not present

## 2018-06-20 LAB — URINALYSIS, COMPLETE (UACMP) WITH MICROSCOPIC
Bilirubin Urine: NEGATIVE
Glucose, UA: NEGATIVE mg/dL
Ketones, ur: 5 mg/dL — AB
Nitrite: NEGATIVE
Protein, ur: 100 mg/dL — AB
Specific Gravity, Urine: 1.015 (ref 1.005–1.030)
Squamous Epithelial / HPF: NONE SEEN (ref 0–5)
pH: 5 (ref 5.0–8.0)

## 2018-06-20 MED ORDER — SODIUM CHLORIDE 0.9 % IV SOLN
1.0000 g | Freq: Once | INTRAVENOUS | Status: AC
  Administered 2018-06-20: 1 g via INTRAVENOUS
  Filled 2018-06-20: qty 10

## 2018-06-20 MED ORDER — CEPHALEXIN 500 MG PO CAPS: 500.0000 mg | ORAL_CAPSULE | Freq: Two times a day (BID) | ORAL | 0 refills | Status: AC

## 2018-06-20 NOTE — ED Triage Notes (Signed)
Pt arrived via [facility] from [facility] (room 114) with a fall. Hx of Parkinson's and HTN. Abrasion to right side of head, skin tear on left hand, left ankle tenderness. Pt A&O x4. Pt on blood thinners. Pt NAD at present.

## 2018-06-20 NOTE — ED Provider Notes (Signed)
-----------------------------------------   3:55 PM on 06/20/2018 -----------------------------------------  Patient's work-up is reassuring.  Urinalysis positive for urinary tract infection.  Imaging is negative for acute abnormalities, or fractures.  We will discharge patient back to his nursing facility with antibiotics to cover for urinary tract infection.  Urine culture is in process.   Harvest Dark, MD 06/20/18 1555

## 2018-06-20 NOTE — ED Provider Notes (Addendum)
[facility] Emergency Department Provider Note       Time seen: ----------------------------------------- 2:36 PM on 06/20/2018 -----------------------------------------   I have reviewed the triage vital signs and the nursing notes.  HISTORY   Chief Complaint Fall   HPI Ivan Clark is a 85 y.o. male with a history of AAA, hypertension, Parkinson's disease, unsteady gait who presents to the ED for fall.  Patient arrives from home place of [place] with a fall.  Patient was noted to have a scalp abrasion, left hand pain, left ankle pain and right knee pain.  He has scattered abrasions.  Patient has a history of balance disturbance with frequent falls.  Past Medical History:  Diagnosis Date  . Aneurysm of infrarenal abdominal aorta (HCC) SUPRAILIAC  4CM PER CT 10-20-2010--  STABLE  . Anorexia   . Basal cell carcinoma   . Bladder cancer ([place]) RECURRENT  (FIRST DX  2001)   HX TURBT AND CHEMO BLADDER INSTILLATION TX'S  . Frequency of urination   . Hyperlipidemia    unspecified  . Hypertension   . Memory loss   . Muscle weakness (generalized)   . Nocturia   . Orthostatic hypotension   . Palliative care encounter   . Parkinson disease ([place])   . Peripheral neuropathy   . Unsteady gait     Patient Active Problem List   Diagnosis Date Noted  . Decreased appetite 06/08/2018  . Generalized muscle weakness 06/08/2018  . Unsteady gait   . Palliative care encounter   . Parkinson disease ([place])   . Peripheral neuropathy   . Memory loss   . Anorexia   . Muscle weakness (generalized)   . Gait abnormality 02/23/2018  . Cellulitis 06/24/2017  . Cellulitis of left upper extremity 06/21/2017  . Cellulitis of left hand 06/21/2017  . Fall 09/10/2016  . Frequent falls 10/17/2015  . Fracture of femoral neck, right ([place]) 04/14/2013  . Right femoral fracture ([place]) 04/09/2013  . Hyperlipidemia 04/09/2013  . Benign prostatic hyperplasia 04/09/2013  .  Closed right hip fracture ([place]) 04/09/2013  . Hip fracture, right ([place]) 04/09/2013  . Femoral neck fracture ([place]) 04/09/2013  . Dysphagia, pharyngeal 03/02/2013  . Cervical dystonia 07/28/2012  . Sialorrhea 07/28/2012  . Personal history of malignant neoplasm of bladder 07/27/2012  . Essential hypertension 07/27/2012  . Dizziness and giddiness 07/27/2012  . Parkinsonism ([place]) 07/27/2012    Past Surgical History:  Procedure Laterality Date  . APPENDECTOMY  10-20-2010  . CYSTO/ BLADDER BX/ FULGERATION BLADDER TUMOR  10-09-2004  . CYSTO/ RETROGRADE PYELOGRAM/ BLADDER BX'S  07-03-2004  . CYSTOSCOPY WITH BIOPSY  11/28/2011   Procedure: CYSTOSCOPY WITH BIOPSY;  Surgeon: Fredricka Bonine, MD;  Location: [facility];  Service: Urology;  Laterality: N/A;  . HIP ARTHROPLASTY Right 04/09/2013   Procedure: ARTHROPLASTY BIPOLAR HIP;  Surgeon: Augustin Schooling, MD;  Location: [facility];  Service: Orthopedics;  Laterality: Right;  . LEFT INGUINAL HERNIA REPAIR  1996  . TONSILLECTOMY AND ADENOIDECTOMY  1938  . TRANSURETHRAL RESECTION OF BLADDER TUMOR  09-11-1999   BLADDER CANCER  . VASECTOMY      Allergies Patient has no known allergies.  Social History Social History   Tobacco Use  . Smoking status: Former Smoker    Packs/day: 2.00    Years: 50.00    Pack years: 100.00    Types: Cigarettes    Last attempt to quit: 05/13/1999    Years since quitting: 19.1  . Smokeless tobacco:  Never Used  Substance Use Topics  . Alcohol use: No    Alcohol/week: 0.0 standard drinks  . Drug use: No   Review of Systems Constitutional: Negative for fever. Cardiovascular: Negative for chest pain. Respiratory: Negative for shortness of breath. Gastrointestinal: Negative for abdominal pain, vomiting and diarrhea. Musculoskeletal: Positive for left ankle, left hand and right knee pain Skin: Positive for abrasion Neurological: Negative for headaches, focal weakness or numbness.  All  systems negative/normal/unremarkable except as stated in the HPI  ____________________________________________   PHYSICAL EXAM:  VITAL SIGNS: ED Triage Vitals  Enc Vitals Group     BP 06/20/18 1426 (!) 155/49     Pulse Rate 06/20/18 1426 (!) 46     Resp 06/20/18 1426 18     Temp 06/20/18 1426 (!) 97.3 F (36.3 C)     Temp Source 06/20/18 1426 Oral     SpO2 06/20/18 1426 97 %     Weight 06/20/18 1427 136 lb 8 oz (61.9 kg)     Height 06/20/18 1427 5\' 6"  (1.676 m)     Head Circumference --      Peak Flow --      Pain Score 06/20/18 1427 2     Pain Loc --      Pain Edu? --      Excl. in [place]? --    Constitutional: Alert and oriented.  No acute distress Eyes: Conjunctivae are normal. Normal extraocular movements. ENT      Head: Normocephalic, right parietal scalp abrasion is noted      Nose: No congestion/rhinnorhea.      Mouth/Throat: Mucous membranes are moist.      Neck: No stridor. Cardiovascular: Normal rate, regular rhythm. No murmurs, rubs, or gallops. Respiratory: Normal respiratory effort without tachypnea nor retractions. Breath sounds are clear and equal bilaterally. No wheezes/rales/rhonchi. Gastrointestinal: Soft and nontender. Normal bowel sounds Musculoskeletal: Mild pain with range of motion of the left hand, right knee and left ankle Neurologic:  Normal speech and language. No gross focal neurologic deficits are appreciated.  Skin: Scattered abrasions and contusions are noted, particularly over the dorsum of the left hand, right knee anteriorly.  Psychiatric: Mood and affect are normal. Speech and behavior are normal.  ____________________________________________  ED COURSE:  As part of my medical decision making, I reviewed the following data within the [facility] History obtained from family if available, nursing notes, old chart and ekg, as well as notes from prior ED visits. Patient presented for a fall, we will assess with  imaging as  indicated at this time.   Procedures ____________________________________________   LABS (pertinent positives/negatives)  Labs Reviewed  URINALYSIS, COMPLETE (UACMP) WITH MICROSCOPIC - Abnormal; Notable for the following components:      Result Value   Color, Urine YELLOW (*)    APPearance CLOUDY (*)    Hgb urine dipstick SMALL (*)    Ketones, ur 5 (*)    Protein, ur 100 (*)    Leukocytes, UA LARGE (*)    WBC, UA >50 (*)    Bacteria, UA FEW (*)    All other components within normal limits  URINE CULTURE    RADIOLOGY  CT head, left ankle x-ray, left hand x-ray, right knee x-ray Are still pending at this time ____________________________________________   DIFFERENTIAL DIAGNOSIS   Fall, contusion, fracture, abrasion, subdural, subarachnoid  FINAL ASSESSMENT AND PLAN  Fall, minor head injury, abrasion, UTI   Plan: The patient had presented for fall with  a history of same.  Urinalysis does indicate urinary tract infection.  Patient was given IV Rocephin and we did send a urine culture.  Remainder of imaging is pending.   Laurence Aly, MD    Note: This note was generated in part or whole with voice recognition software. Voice recognition is usually quite accurate but there are transcription errors that can and very often do occur. I apologize for any typographical errors that were not detected and corrected.     Earleen Newport, MD 06/20/18 1442    Earleen Newport, MD 06/20/18 [phone]

## 2018-06-21 DIAGNOSIS — R634 Abnormal weight loss: Secondary | ICD-10-CM | POA: Diagnosis not present

## 2018-06-21 DIAGNOSIS — R296 Repeated falls: Secondary | ICD-10-CM | POA: Diagnosis not present

## 2018-06-21 DIAGNOSIS — G20 Parkinson's disease: Secondary | ICD-10-CM | POA: Diagnosis not present

## 2018-06-21 DIAGNOSIS — N390 Urinary tract infection, site not specified: Secondary | ICD-10-CM | POA: Diagnosis not present

## 2018-06-22 ENCOUNTER — Other Ambulatory Visit: Payer: Self-pay | Admitting: Nurse Practitioner

## 2018-06-23 DIAGNOSIS — R351 Nocturia: Secondary | ICD-10-CM | POA: Diagnosis not present

## 2018-06-23 DIAGNOSIS — I951 Orthostatic hypotension: Secondary | ICD-10-CM | POA: Diagnosis not present

## 2018-06-23 DIAGNOSIS — G629 Polyneuropathy, unspecified: Secondary | ICD-10-CM | POA: Diagnosis not present

## 2018-06-23 DIAGNOSIS — I714 Abdominal aortic aneurysm, without rupture: Secondary | ICD-10-CM | POA: Diagnosis not present

## 2018-06-23 DIAGNOSIS — Z9181 History of falling: Secondary | ICD-10-CM | POA: Diagnosis not present

## 2018-06-23 DIAGNOSIS — Z8551 Personal history of malignant neoplasm of bladder: Secondary | ICD-10-CM | POA: Diagnosis not present

## 2018-06-23 DIAGNOSIS — E785 Hyperlipidemia, unspecified: Secondary | ICD-10-CM | POA: Diagnosis not present

## 2018-06-23 DIAGNOSIS — I10 Essential (primary) hypertension: Secondary | ICD-10-CM | POA: Diagnosis not present

## 2018-06-23 DIAGNOSIS — N401 Enlarged prostate with lower urinary tract symptoms: Secondary | ICD-10-CM | POA: Diagnosis not present

## 2018-06-23 DIAGNOSIS — G20 Parkinson's disease: Secondary | ICD-10-CM | POA: Diagnosis not present

## 2018-06-23 LAB — URINE CULTURE: Culture: >=100000 — AB

## 2018-06-24 NOTE — Progress Notes (Signed)
ED Antimicrobial Stewardship Positive Culture Follow Up   Ivan Clark. is an 85 y.o. male who presented to [facility] on 06/20/2018 with a chief complaint of  Chief Complaint  Patient presents with  . Fall    Recent Results (from the past 720 hour(s))  Urine Culture     Status: Abnormal   Collection Time: 06/20/18  3:02 PM  Result Value Ref Range Status   Specimen Description   Final    URINE, RANDOM Performed at [facility], [address], [place], [place] [address]    Special Requests   Final    NONE Performed at [facility], [place], [place] [address]    Culture >=100,000 COLONIES/mL CITROBACTER FREUNDII (A)  Final   Report Status 06/23/2018 FINAL  Final   Organism ID, Bacteria CITROBACTER FREUNDII (A)  Final      Susceptibility   Citrobacter freundii - MIC*    CEFAZOLIN >=64 RESISTANT Resistant     CEFTRIAXONE <=1 SENSITIVE Sensitive     CIPROFLOXACIN <=0.25 SENSITIVE Sensitive     GENTAMICIN <=1 SENSITIVE Sensitive     IMIPENEM 0.5 SENSITIVE Sensitive     NITROFURANTOIN <=16 SENSITIVE Sensitive     TRIMETH/SULFA <=20 SENSITIVE Sensitive     PIP/TAZO <=4 SENSITIVE Sensitive     * >=100,000 COLONIES/mL CITROBACTER FREUNDII    [x]  Treated with Keflex 500 mg BID for 10 days, organism resistant to prescribed antimicrobial   New antibiotic prescription: Patient without symptoms and therefore to be treated as uncomplicated UTI, Cipro 388 mg every 12 hours for 3 days  ED Provider: Dr. Harvest Dark  2/13 @ 1420 Left voicemail for Daughter, patient's contact per chart, to call back when convenient.  2/13 @ 1445 Spoke with daughter regarding need for new antibiotic.  Daughter understanding of new information.   2/13 @ 1515 Called in new prescription to [facility].   Forrest Moron, PharmD Clinical Pharmacist 06/24/2018, 2:23 PM Clinical Pharmacist

## 2018-06-28 DIAGNOSIS — N390 Urinary tract infection, site not specified: Secondary | ICD-10-CM | POA: Diagnosis not present

## 2018-06-28 DIAGNOSIS — G20 Parkinson's disease: Secondary | ICD-10-CM | POA: Diagnosis not present

## 2018-06-28 DIAGNOSIS — M79601 Pain in right arm: Secondary | ICD-10-CM | POA: Diagnosis not present

## 2018-06-28 DIAGNOSIS — K5900 Constipation, unspecified: Secondary | ICD-10-CM | POA: Diagnosis not present

## 2018-07-07 DIAGNOSIS — M79601 Pain in right arm: Secondary | ICD-10-CM | POA: Diagnosis not present

## 2018-07-07 DIAGNOSIS — M6240 Contracture of muscle, unspecified site: Secondary | ICD-10-CM | POA: Diagnosis not present

## 2018-07-07 DIAGNOSIS — Z79899 Other long term (current) drug therapy: Secondary | ICD-10-CM | POA: Diagnosis not present

## 2018-07-07 DIAGNOSIS — T07XXXD Unspecified multiple injuries, subsequent encounter: Secondary | ICD-10-CM | POA: Diagnosis not present

## 2018-07-07 DIAGNOSIS — R269 Unspecified abnormalities of gait and mobility: Secondary | ICD-10-CM | POA: Diagnosis not present

## 2018-07-07 DIAGNOSIS — R54 Age-related physical debility: Secondary | ICD-10-CM | POA: Diagnosis not present

## 2018-07-07 DIAGNOSIS — R410 Disorientation, unspecified: Secondary | ICD-10-CM | POA: Diagnosis not present

## 2018-07-07 DIAGNOSIS — S46911A Strain of unspecified muscle, fascia and tendon at shoulder and upper arm level, right arm, initial encounter: Secondary | ICD-10-CM | POA: Diagnosis not present

## 2018-07-07 DIAGNOSIS — N390 Urinary tract infection, site not specified: Secondary | ICD-10-CM | POA: Diagnosis not present

## 2018-07-07 DIAGNOSIS — R531 Weakness: Secondary | ICD-10-CM | POA: Diagnosis not present

## 2018-07-12 DIAGNOSIS — T07XXXD Unspecified multiple injuries, subsequent encounter: Secondary | ICD-10-CM | POA: Diagnosis not present

## 2018-07-12 DIAGNOSIS — M79601 Pain in right arm: Secondary | ICD-10-CM | POA: Diagnosis not present

## 2018-07-12 DIAGNOSIS — G20 Parkinson's disease: Secondary | ICD-10-CM | POA: Diagnosis not present

## 2018-07-12 DIAGNOSIS — S46911D Strain of unspecified muscle, fascia and tendon at shoulder and upper arm level, right arm, subsequent encounter: Secondary | ICD-10-CM | POA: Diagnosis not present

## 2018-07-19 DIAGNOSIS — G20 Parkinson's disease: Secondary | ICD-10-CM | POA: Diagnosis not present

## 2018-07-19 DIAGNOSIS — R296 Repeated falls: Secondary | ICD-10-CM | POA: Diagnosis not present

## 2018-07-19 DIAGNOSIS — M79601 Pain in right arm: Secondary | ICD-10-CM | POA: Diagnosis not present

## 2018-07-19 DIAGNOSIS — S46911D Strain of unspecified muscle, fascia and tendon at shoulder and upper arm level, right arm, subsequent encounter: Secondary | ICD-10-CM | POA: Diagnosis not present

## 2018-07-22 DIAGNOSIS — N390 Urinary tract infection, site not specified: Secondary | ICD-10-CM | POA: Diagnosis not present

## 2018-07-22 DIAGNOSIS — Z79899 Other long term (current) drug therapy: Secondary | ICD-10-CM | POA: Diagnosis not present

## 2018-07-26 DIAGNOSIS — B952 Enterococcus as the cause of diseases classified elsewhere: Secondary | ICD-10-CM | POA: Diagnosis not present

## 2018-07-26 DIAGNOSIS — M25511 Pain in right shoulder: Secondary | ICD-10-CM | POA: Diagnosis not present

## 2018-07-26 DIAGNOSIS — M79621 Pain in right upper arm: Secondary | ICD-10-CM | POA: Diagnosis not present

## 2018-07-26 DIAGNOSIS — N390 Urinary tract infection, site not specified: Secondary | ICD-10-CM | POA: Diagnosis not present

## 2018-07-26 DIAGNOSIS — M79601 Pain in right arm: Secondary | ICD-10-CM | POA: Diagnosis not present

## 2018-07-26 DIAGNOSIS — G20 Parkinson's disease: Secondary | ICD-10-CM | POA: Diagnosis not present

## 2018-07-26 DIAGNOSIS — M25521 Pain in right elbow: Secondary | ICD-10-CM | POA: Diagnosis not present

## 2018-07-26 DIAGNOSIS — M79631 Pain in right forearm: Secondary | ICD-10-CM | POA: Diagnosis not present

## 2018-07-27 DIAGNOSIS — M79601 Pain in right arm: Secondary | ICD-10-CM | POA: Diagnosis not present

## 2018-07-27 DIAGNOSIS — G20 Parkinson's disease: Secondary | ICD-10-CM | POA: Diagnosis not present

## 2018-07-27 DIAGNOSIS — N390 Urinary tract infection, site not specified: Secondary | ICD-10-CM | POA: Diagnosis not present

## 2018-07-27 DIAGNOSIS — B952 Enterococcus as the cause of diseases classified elsewhere: Secondary | ICD-10-CM | POA: Diagnosis not present

## 2018-07-29 DIAGNOSIS — S46911D Strain of unspecified muscle, fascia and tendon at shoulder and upper arm level, right arm, subsequent encounter: Secondary | ICD-10-CM | POA: Diagnosis not present

## 2018-07-29 DIAGNOSIS — N390 Urinary tract infection, site not specified: Secondary | ICD-10-CM | POA: Diagnosis not present

## 2018-07-29 DIAGNOSIS — Z79899 Other long term (current) drug therapy: Secondary | ICD-10-CM | POA: Diagnosis not present

## 2018-08-02 DIAGNOSIS — G20 Parkinson's disease: Secondary | ICD-10-CM | POA: Diagnosis not present

## 2018-08-02 DIAGNOSIS — E860 Dehydration: Secondary | ICD-10-CM | POA: Diagnosis not present

## 2018-08-02 DIAGNOSIS — M19011 Primary osteoarthritis, right shoulder: Secondary | ICD-10-CM | POA: Diagnosis not present

## 2018-08-02 DIAGNOSIS — N390 Urinary tract infection, site not specified: Secondary | ICD-10-CM | POA: Diagnosis not present

## 2018-08-05 DIAGNOSIS — R531 Weakness: Secondary | ICD-10-CM | POA: Diagnosis not present

## 2018-08-05 DIAGNOSIS — R54 Age-related physical debility: Secondary | ICD-10-CM | POA: Diagnosis not present

## 2018-08-05 DIAGNOSIS — M6240 Contracture of muscle, unspecified site: Secondary | ICD-10-CM | POA: Diagnosis not present

## 2018-08-05 DIAGNOSIS — R269 Unspecified abnormalities of gait and mobility: Secondary | ICD-10-CM | POA: Diagnosis not present

## 2018-08-11 ENCOUNTER — Telehealth: Payer: Self-pay | Admitting: Neurology

## 2018-08-11 NOTE — Telephone Encounter (Signed)
Called and tried to get patient scheduled for virtual visit . Patients daughter relayed patient is on lock down in assisted living and apt for 15 th would    Need to be CX as well she will call the office back at a later date.

## 2018-08-16 DIAGNOSIS — N390 Urinary tract infection, site not specified: Secondary | ICD-10-CM | POA: Diagnosis not present

## 2018-08-16 DIAGNOSIS — R4189 Other symptoms and signs involving cognitive functions and awareness: Secondary | ICD-10-CM | POA: Diagnosis not present

## 2018-08-16 DIAGNOSIS — S51001A Unspecified open wound of right elbow, initial encounter: Secondary | ICD-10-CM | POA: Diagnosis not present

## 2018-08-16 DIAGNOSIS — G20 Parkinson's disease: Secondary | ICD-10-CM | POA: Diagnosis not present

## 2018-08-16 DIAGNOSIS — R1310 Dysphagia, unspecified: Secondary | ICD-10-CM | POA: Diagnosis not present

## 2018-08-23 DIAGNOSIS — R4189 Other symptoms and signs involving cognitive functions and awareness: Secondary | ICD-10-CM | POA: Diagnosis not present

## 2018-08-23 DIAGNOSIS — G20 Parkinson's disease: Secondary | ICD-10-CM | POA: Diagnosis not present

## 2018-08-23 DIAGNOSIS — S51001A Unspecified open wound of right elbow, initial encounter: Secondary | ICD-10-CM | POA: Diagnosis not present

## 2018-08-23 DIAGNOSIS — N390 Urinary tract infection, site not specified: Secondary | ICD-10-CM | POA: Diagnosis not present

## 2018-08-25 ENCOUNTER — Ambulatory Visit: Payer: Medicare Other | Admitting: Neurology

## 2018-08-30 DIAGNOSIS — I10 Essential (primary) hypertension: Secondary | ICD-10-CM | POA: Diagnosis not present

## 2018-08-30 DIAGNOSIS — G20 Parkinson's disease: Secondary | ICD-10-CM | POA: Diagnosis not present

## 2018-08-30 DIAGNOSIS — N390 Urinary tract infection, site not specified: Secondary | ICD-10-CM | POA: Diagnosis not present

## 2018-08-30 DIAGNOSIS — G8921 Chronic pain due to trauma: Secondary | ICD-10-CM | POA: Diagnosis not present

## 2018-12-11 DEATH — deceased

## 2019-05-03 ENCOUNTER — Other Ambulatory Visit: Payer: Medicare Other | Admitting: Urology

## 2020-06-29 IMAGING — CT CT CERVICAL SPINE W/O CM
3 of 5 series · 9 of 33 positions shown, 10 images · non-contrast
Comparison: CT head 06/13/2017. CT cervical spine 11/15/2016

CLINICAL DATA: Fall. No loss of consciousness. Swelling and
bruising behind the left ear. History of Parkinson's disease.

EXAM:
CT HEAD WITHOUT CONTRAST
CT CERVICAL SPINE WITHOUT CONTRAST
TECHNIQUE: Multidetector CT imaging of the head and cervical spine was
performed following the standard protocol without intravenous
contrast. Multiplanar CT image reconstructions of the cervical spine
were also generated.

[Series 4: sagittal bone · sagittal · 0.19mm/px · 5 of 58 slices shown]
[im 10/58  bone]
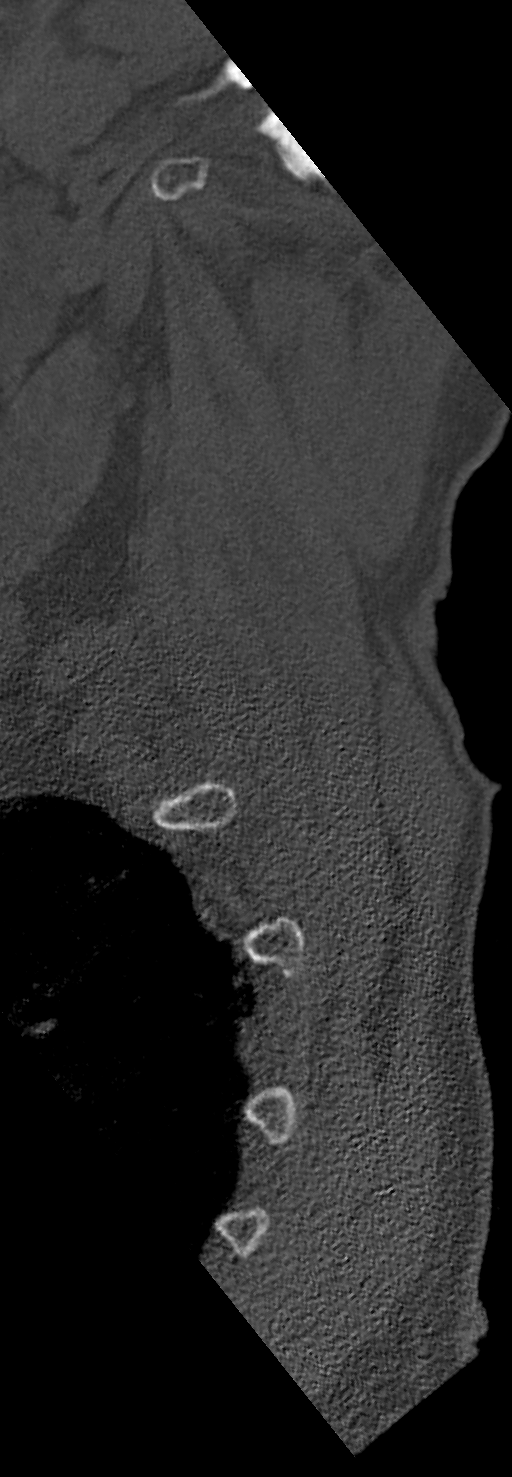
[im 20/58  bone]
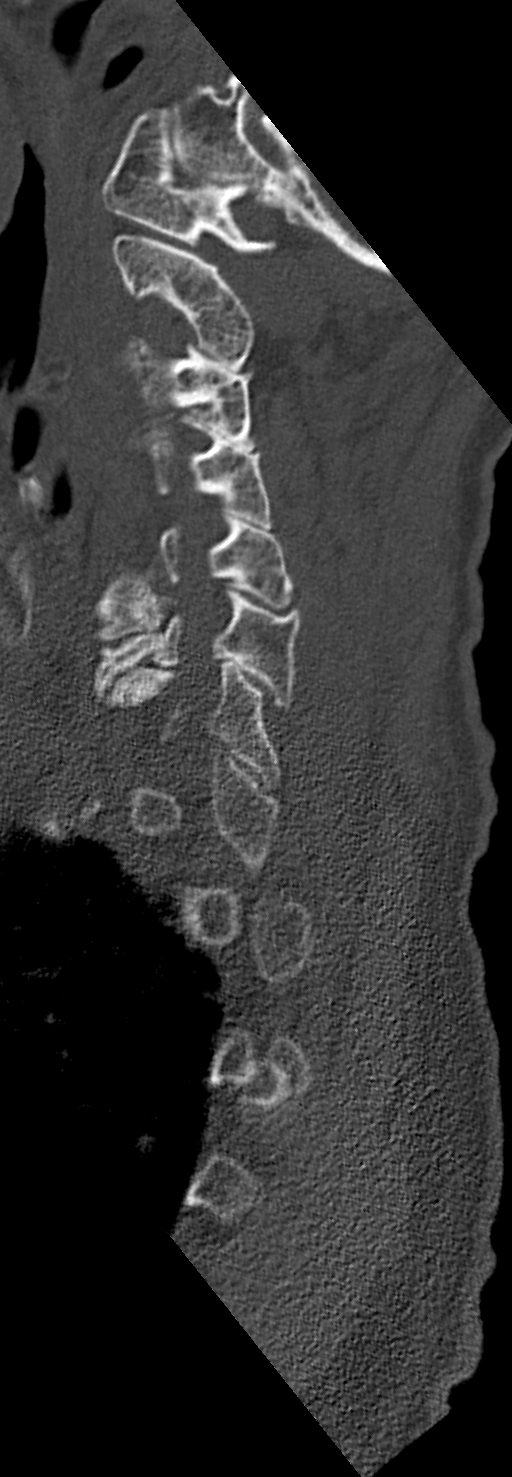
[im 29/58  bone]
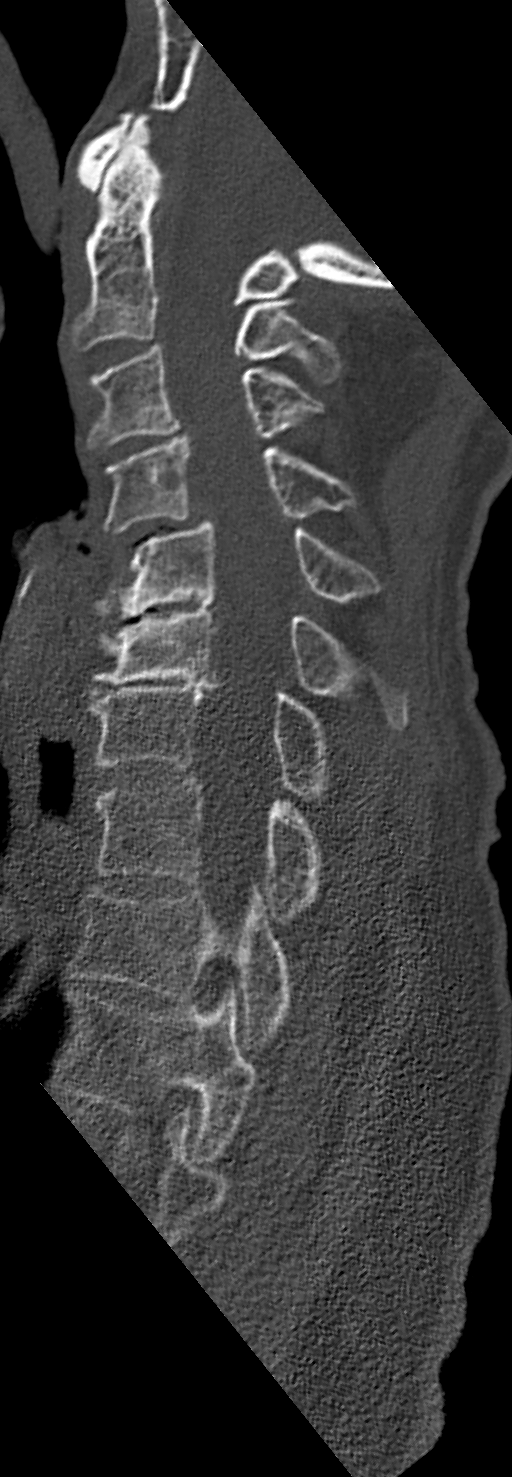
[im 39/58  bone]
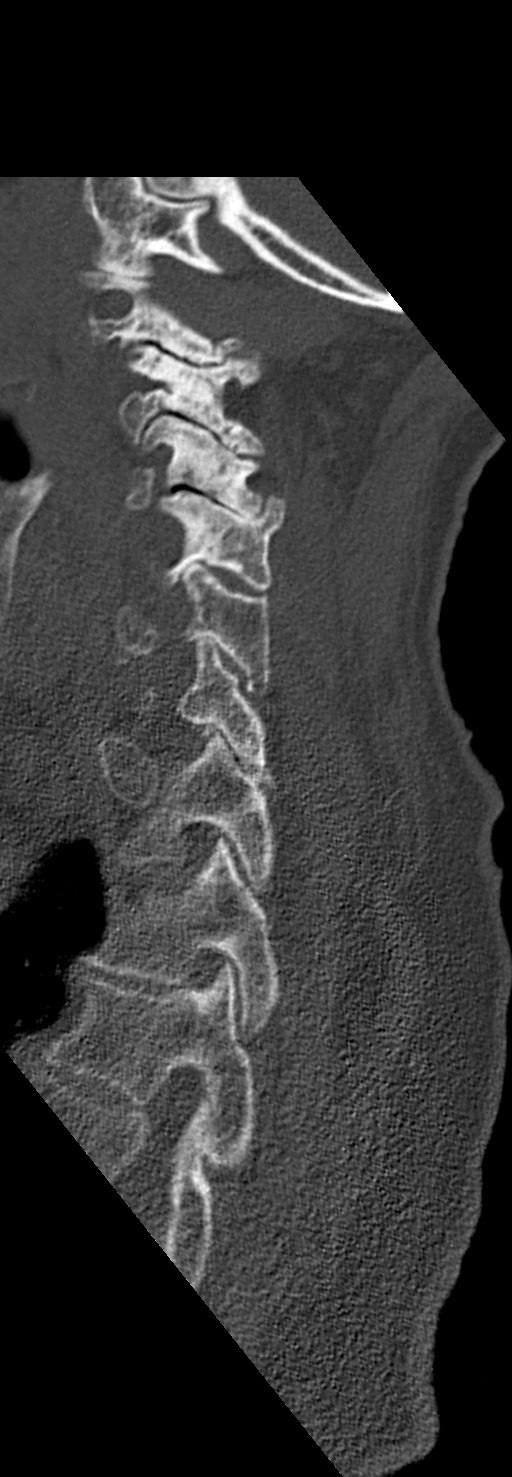
[im 48/58  bone]
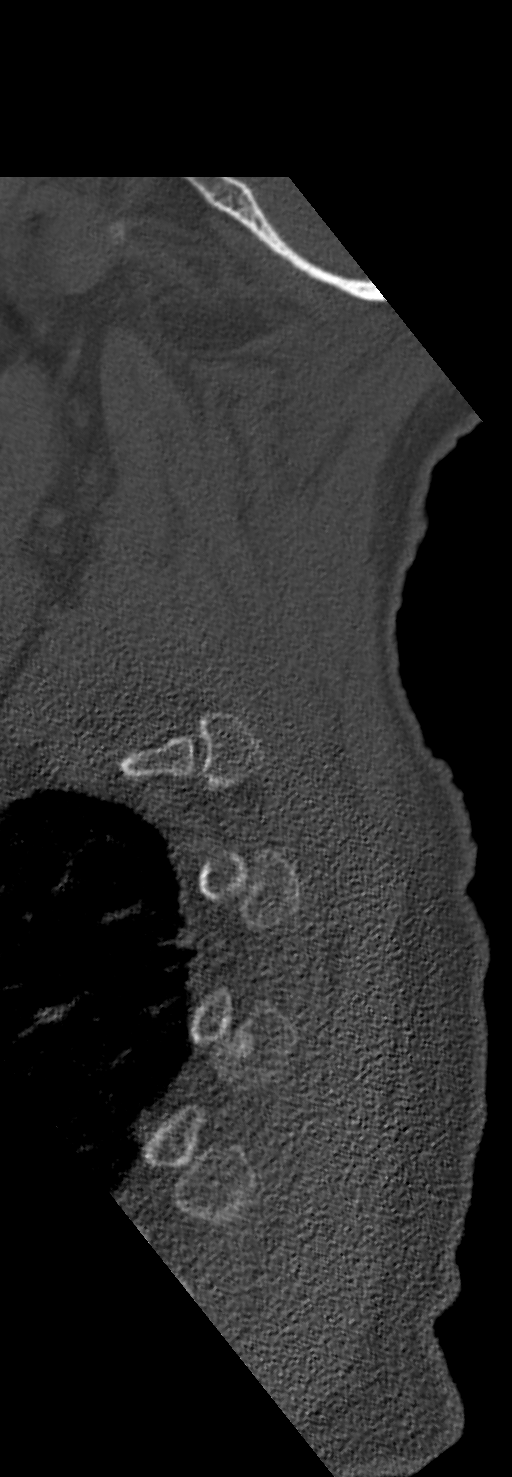

[Series 6: coronal soft tissue · coronal · 0.30mm/px · 3 of 67 slices shown]
[im 21/67  bone]
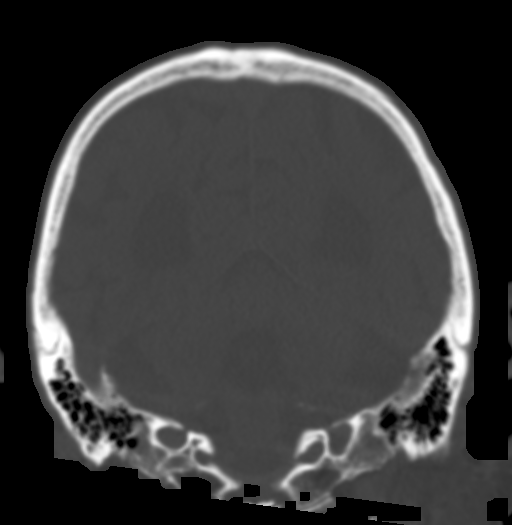
[im 29/67  bone]
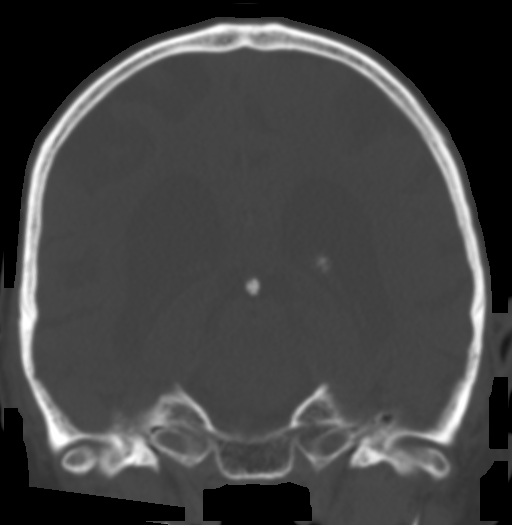
[im 38/67  bone]
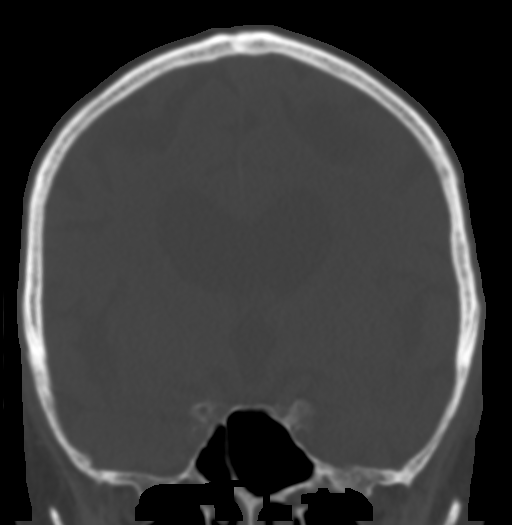

[Series 8: c spine soft · axial · 0.44mm/px · z∈[+666,+666]mm · 1 of 73 slices shown, 2 images]
[im 41/73  soft-tissue]
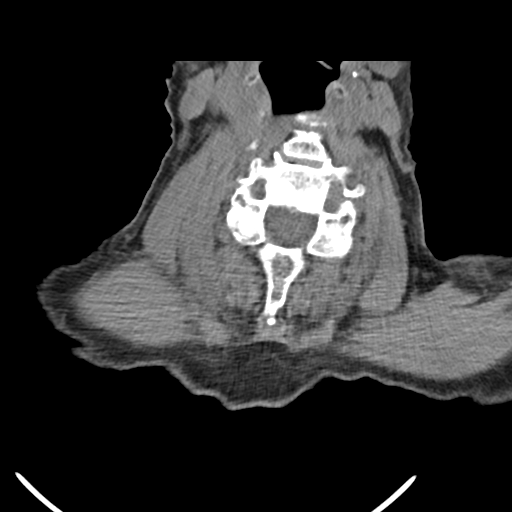
[im 41/73  bone]
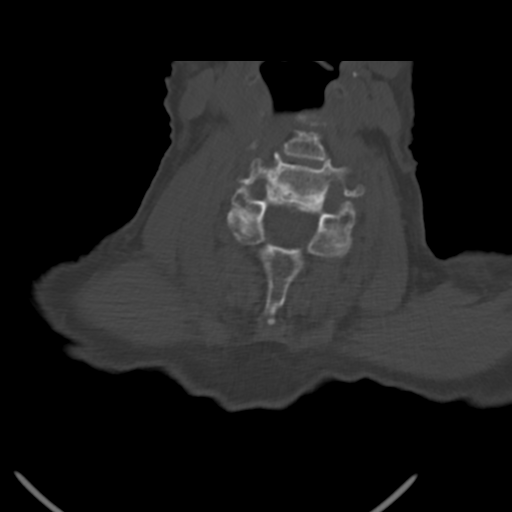

[9 of 33 positions shown; findings below may reference images not displayed]

FINDINGS: CT HEAD FINDINGS

Brain: Diffuse cerebral atrophy. Ventricular dilatation likely due
to central atrophy. Low-attenuation changes in the deep white matter
consistent with small vessel ischemia. No mass-effect or midline
shift. No abnormal extra-axial fluid collections. Gray-white matter
junctions are distinct. Basal cisterns are not effaced. No acute
intracranial hemorrhage.

Vascular: Mild intracranial arterial calcifications.

Skull: Calvarium appears intact. No acute depressed skull fractures.

Sinuses/Orbits: Paranasal sinuses and mastoid air cells are clear.

Other: None.

CT CERVICAL SPINE FINDINGS

Alignment: There is reversal of the usual cervical lordosis with
anterior subluxations at C3-4 and C4-5 levels. Alignment of the
cervical spine is unchanged since previous study, suggesting chronic
degenerative changes. C1-2 articulation appears intact.

Skull base and vertebrae: Skull base appears intact. No vertebral
compression deformities. No focal bone lesion or bone destruction.

Soft tissues and spinal canal: No prevertebral soft tissue swelling.
No abnormal paraspinal soft tissue mass or infiltration.

Disc levels: Degenerative changes throughout the cervical spine with
narrowed interspaces and hypertrophic changes particularly at C5-6
and C6-7 levels. Degenerative disc disease at C4-5, C5-6, and C6-7
levels. Degenerative changes throughout the cervical facet joints.

Upper chest: Prominent emphysematous changes in the lung apices.
Vascular calcifications.

Other: None.
IMPRESSION: 1. No acute intracranial abnormalities. Chronic atrophy and small
vessel ischemic changes.
2. Reversal of the usual cervical lordosis with anterior
subluxations at C3-4 and C4-5 levels, unchanged since prior study,
likely degenerative. Degenerative changes throughout the cervical
spine. No acute displaced fractures identified.
